# Patient Record
Sex: Female | Born: 1935 | Race: White | Hispanic: No | Marital: Married | Smoking: Former smoker
Health system: Southern US, Community
[De-identification: ages and names within clinical notes are randomized; demographics above are authoritative.]

## PROBLEM LIST (undated history)

## (undated) DIAGNOSIS — Z87442 Personal history of urinary calculi: Secondary | ICD-10-CM

## (undated) DIAGNOSIS — N819 Female genital prolapse, unspecified: Secondary | ICD-10-CM

## (undated) DIAGNOSIS — I10 Essential (primary) hypertension: Secondary | ICD-10-CM

## (undated) DIAGNOSIS — I2510 Atherosclerotic heart disease of native coronary artery without angina pectoris: Secondary | ICD-10-CM

## (undated) DIAGNOSIS — J309 Allergic rhinitis, unspecified: Secondary | ICD-10-CM

## (undated) DIAGNOSIS — M8580 Other specified disorders of bone density and structure, unspecified site: Secondary | ICD-10-CM

## (undated) DIAGNOSIS — K5790 Diverticulosis of intestine, part unspecified, without perforation or abscess without bleeding: Secondary | ICD-10-CM

## (undated) DIAGNOSIS — R011 Cardiac murmur, unspecified: Secondary | ICD-10-CM

## (undated) DIAGNOSIS — I219 Acute myocardial infarction, unspecified: Secondary | ICD-10-CM

## (undated) DIAGNOSIS — I493 Ventricular premature depolarization: Secondary | ICD-10-CM

## (undated) DIAGNOSIS — M1990 Unspecified osteoarthritis, unspecified site: Secondary | ICD-10-CM

## (undated) DIAGNOSIS — E785 Hyperlipidemia, unspecified: Secondary | ICD-10-CM

## (undated) DIAGNOSIS — K219 Gastro-esophageal reflux disease without esophagitis: Secondary | ICD-10-CM

## (undated) DIAGNOSIS — I6529 Occlusion and stenosis of unspecified carotid artery: Secondary | ICD-10-CM

## (undated) DIAGNOSIS — T8859XA Other complications of anesthesia, initial encounter: Secondary | ICD-10-CM

## (undated) HISTORY — DX: Female genital prolapse, unspecified: N81.9

## (undated) HISTORY — PX: CYSTOSCOPY: SUR368

## (undated) HISTORY — PX: CARDIAC CATHETERIZATION: SHX172

## (undated) HISTORY — DX: Allergic rhinitis, unspecified: J30.9

## (undated) HISTORY — DX: Ventricular premature depolarization: I49.3

## (undated) HISTORY — DX: Diverticulosis of intestine, part unspecified, without perforation or abscess without bleeding: K57.90

## (undated) HISTORY — DX: Occlusion and stenosis of unspecified carotid artery: I65.29

## (undated) HISTORY — DX: Hyperlipidemia, unspecified: E78.5

## (undated) HISTORY — DX: Other specified disorders of bone density and structure, unspecified site: M85.80

## (undated) HISTORY — DX: Unspecified osteoarthritis, unspecified site: M19.90

## (undated) HISTORY — DX: Essential (primary) hypertension: I10

## (undated) HISTORY — DX: Atherosclerotic heart disease of native coronary artery without angina pectoris: I25.10

## (undated) HISTORY — DX: Cardiac murmur, unspecified: R01.1

---

## 1997-11-16 ENCOUNTER — Other Ambulatory Visit: Admission: RE | Admit: 1997-11-16 | Discharge: 1997-11-16 | Payer: Self-pay | Admitting: Family Medicine

## 1998-02-07 ENCOUNTER — Ambulatory Visit (HOSPITAL_COMMUNITY): Admission: RE | Admit: 1998-02-07 | Discharge: 1998-02-07 | Payer: Self-pay | Admitting: Family Medicine

## 1999-02-20 ENCOUNTER — Ambulatory Visit (HOSPITAL_COMMUNITY): Admission: RE | Admit: 1999-02-20 | Discharge: 1999-02-20 | Payer: Self-pay | Admitting: Family Medicine

## 1999-02-20 ENCOUNTER — Encounter: Payer: Self-pay | Admitting: Family Medicine

## 1999-06-07 ENCOUNTER — Encounter: Admission: RE | Admit: 1999-06-07 | Discharge: 1999-06-07 | Payer: Self-pay | Admitting: Family Medicine

## 1999-06-07 ENCOUNTER — Encounter: Payer: Self-pay | Admitting: Family Medicine

## 2000-04-30 ENCOUNTER — Encounter: Admission: RE | Admit: 2000-04-30 | Discharge: 2000-04-30 | Payer: Self-pay | Admitting: Otolaryngology

## 2000-04-30 ENCOUNTER — Encounter: Payer: Self-pay | Admitting: Otolaryngology

## 2000-06-02 HISTORY — PX: CARPAL TUNNEL RELEASE: SHX101

## 2000-06-23 ENCOUNTER — Ambulatory Visit (HOSPITAL_COMMUNITY): Admission: RE | Admit: 2000-06-23 | Discharge: 2000-06-23 | Payer: Self-pay | Admitting: Family Medicine

## 2000-06-23 ENCOUNTER — Encounter: Payer: Self-pay | Admitting: Family Medicine

## 2001-09-07 ENCOUNTER — Encounter: Payer: Self-pay | Admitting: Family Medicine

## 2001-09-07 ENCOUNTER — Ambulatory Visit (HOSPITAL_COMMUNITY): Admission: RE | Admit: 2001-09-07 | Discharge: 2001-09-07 | Payer: Self-pay | Admitting: Family Medicine

## 2002-01-03 ENCOUNTER — Encounter: Admission: RE | Admit: 2002-01-03 | Discharge: 2002-01-03 | Payer: Self-pay | Admitting: Family Medicine

## 2002-01-03 ENCOUNTER — Encounter: Payer: Self-pay | Admitting: Family Medicine

## 2002-05-17 ENCOUNTER — Encounter: Admission: RE | Admit: 2002-05-17 | Discharge: 2002-05-17 | Payer: Self-pay | Admitting: Family Medicine

## 2002-05-17 ENCOUNTER — Encounter: Payer: Self-pay | Admitting: Family Medicine

## 2002-07-25 ENCOUNTER — Ambulatory Visit (HOSPITAL_COMMUNITY): Admission: RE | Admit: 2002-07-25 | Discharge: 2002-07-25 | Payer: Self-pay | Admitting: Family Medicine

## 2002-07-25 ENCOUNTER — Encounter: Payer: Self-pay | Admitting: Family Medicine

## 2002-09-27 ENCOUNTER — Encounter: Payer: Self-pay | Admitting: Family Medicine

## 2002-09-27 ENCOUNTER — Ambulatory Visit (HOSPITAL_COMMUNITY): Admission: RE | Admit: 2002-09-27 | Discharge: 2002-09-27 | Payer: Self-pay | Admitting: Family Medicine

## 2003-09-07 ENCOUNTER — Other Ambulatory Visit: Admission: RE | Admit: 2003-09-07 | Discharge: 2003-09-07 | Payer: Self-pay | Admitting: Family Medicine

## 2003-10-03 ENCOUNTER — Ambulatory Visit (HOSPITAL_COMMUNITY): Admission: RE | Admit: 2003-10-03 | Discharge: 2003-10-03 | Payer: Self-pay | Admitting: Family Medicine

## 2004-10-15 ENCOUNTER — Ambulatory Visit (HOSPITAL_COMMUNITY): Admission: RE | Admit: 2004-10-15 | Discharge: 2004-10-15 | Payer: Self-pay | Admitting: Family Medicine

## 2004-11-15 ENCOUNTER — Encounter: Admission: RE | Admit: 2004-11-15 | Discharge: 2004-11-15 | Payer: Self-pay | Admitting: Family Medicine

## 2004-11-15 ENCOUNTER — Other Ambulatory Visit: Admission: RE | Admit: 2004-11-15 | Discharge: 2004-11-15 | Payer: Self-pay | Admitting: Family Medicine

## 2005-11-11 ENCOUNTER — Ambulatory Visit (HOSPITAL_COMMUNITY): Admission: RE | Admit: 2005-11-11 | Discharge: 2005-11-11 | Payer: Self-pay | Admitting: Family Medicine

## 2005-11-14 ENCOUNTER — Other Ambulatory Visit: Admission: RE | Admit: 2005-11-14 | Discharge: 2005-11-14 | Payer: Self-pay | Admitting: Family Medicine

## 2006-08-01 HISTORY — PX: BUNIONECTOMY: SHX129

## 2006-08-14 ENCOUNTER — Ambulatory Visit (HOSPITAL_BASED_OUTPATIENT_CLINIC_OR_DEPARTMENT_OTHER): Admission: RE | Admit: 2006-08-14 | Discharge: 2006-08-14 | Payer: Self-pay | Admitting: Podiatry

## 2006-11-13 ENCOUNTER — Ambulatory Visit (HOSPITAL_COMMUNITY): Admission: RE | Admit: 2006-11-13 | Discharge: 2006-11-13 | Payer: Self-pay | Admitting: Family Medicine

## 2007-08-01 HISTORY — PX: TOTAL KNEE ARTHROPLASTY: SHX125

## 2007-08-12 ENCOUNTER — Inpatient Hospital Stay (HOSPITAL_COMMUNITY): Admission: RE | Admit: 2007-08-12 | Discharge: 2007-08-15 | Payer: Self-pay | Admitting: Orthopedic Surgery

## 2007-08-13 ENCOUNTER — Ambulatory Visit: Payer: Self-pay | Admitting: Physical Medicine & Rehabilitation

## 2007-11-25 ENCOUNTER — Ambulatory Visit (HOSPITAL_COMMUNITY): Admission: RE | Admit: 2007-11-25 | Discharge: 2007-11-25 | Payer: Self-pay | Admitting: Family Medicine

## 2008-02-10 ENCOUNTER — Other Ambulatory Visit: Admission: RE | Admit: 2008-02-10 | Discharge: 2008-02-10 | Payer: Self-pay | Admitting: Family Medicine

## 2008-11-27 ENCOUNTER — Ambulatory Visit (HOSPITAL_COMMUNITY): Admission: RE | Admit: 2008-11-27 | Discharge: 2008-11-27 | Payer: Self-pay | Admitting: Family Medicine

## 2009-08-17 ENCOUNTER — Encounter: Admission: RE | Admit: 2009-08-17 | Discharge: 2009-08-17 | Payer: Self-pay | Admitting: Rheumatology

## 2009-12-04 ENCOUNTER — Ambulatory Visit (HOSPITAL_COMMUNITY): Admission: RE | Admit: 2009-12-04 | Discharge: 2009-12-04 | Payer: Self-pay | Admitting: Family Medicine

## 2010-03-02 HISTORY — PX: TOTAL KNEE ARTHROPLASTY: SHX125

## 2010-03-13 ENCOUNTER — Inpatient Hospital Stay (HOSPITAL_COMMUNITY): Admission: RE | Admit: 2010-03-13 | Discharge: 2010-03-16 | Payer: Self-pay | Admitting: Orthopedic Surgery

## 2010-04-03 ENCOUNTER — Inpatient Hospital Stay (HOSPITAL_COMMUNITY): Admission: EM | Admit: 2010-04-03 | Discharge: 2010-04-07 | Payer: Self-pay | Admitting: Emergency Medicine

## 2010-06-23 ENCOUNTER — Encounter: Payer: Self-pay | Admitting: Family Medicine

## 2010-08-13 LAB — CBC
HCT: 26.8 % — ABNORMAL LOW (ref 36.0–46.0)
HCT: 27.4 % — ABNORMAL LOW (ref 36.0–46.0)
HCT: 30.2 % — ABNORMAL LOW (ref 36.0–46.0)
Hemoglobin: 8.7 g/dL — ABNORMAL LOW (ref 12.0–15.0)
Hemoglobin: 9.2 g/dL — ABNORMAL LOW (ref 12.0–15.0)
Hemoglobin: 9.9 g/dL — ABNORMAL LOW (ref 12.0–15.0)
MCH: 29 pg (ref 26.0–34.0)
MCHC: 32.5 g/dL (ref 30.0–36.0)
MCHC: 32.6 g/dL (ref 30.0–36.0)
MCV: 90.5 fL (ref 78.0–100.0)
Platelets: 277 10*3/uL (ref 150–400)
RBC: 2.92 MIL/uL — ABNORMAL LOW (ref 3.87–5.11)
RBC: 3.04 MIL/uL — ABNORMAL LOW (ref 3.87–5.11)
RBC: 3.17 MIL/uL — ABNORMAL LOW (ref 3.87–5.11)
RBC: 3.38 MIL/uL — ABNORMAL LOW (ref 3.87–5.11)
RDW: 15.3 % (ref 11.5–15.5)
RDW: 15.6 % — ABNORMAL HIGH (ref 11.5–15.5)
WBC: 10.9 10*3/uL — ABNORMAL HIGH (ref 4.0–10.5)
WBC: 10.9 10*3/uL — ABNORMAL HIGH (ref 4.0–10.5)

## 2010-08-13 LAB — BASIC METABOLIC PANEL
BUN: 12 mg/dL (ref 6–23)
BUN: 16 mg/dL (ref 6–23)
CO2: 25 mEq/L (ref 19–32)
Calcium: 9 mg/dL (ref 8.4–10.5)
Calcium: 9.1 mg/dL (ref 8.4–10.5)
Chloride: 107 mEq/L (ref 96–112)
Chloride: 109 mEq/L (ref 96–112)
Creatinine, Ser: 0.99 mg/dL (ref 0.4–1.2)
GFR calc Af Amer: >60 mL/min (ref >60)
GFR calc Af Amer: >60 mL/min (ref >60)
GFR calc Af Amer: >60 mL/min (ref >60)
GFR calc non Af Amer: 50 mL/min — ABNORMAL LOW (ref >60)
GFR calc non Af Amer: 55 mL/min — ABNORMAL LOW (ref >60)
GFR calc non Af Amer: 58 mL/min — ABNORMAL LOW (ref >60)
Glucose, Bld: 100 mg/dL — ABNORMAL HIGH (ref 70–99)
Potassium: 3.6 mEq/L (ref 3.5–5.1)
Potassium: 4.1 mEq/L (ref 3.5–5.1)
Sodium: 140 mEq/L (ref 135–145)
Sodium: 141 mEq/L (ref 135–145)

## 2010-08-13 LAB — CULTURE, BLOOD (ROUTINE X 2)
Culture  Setup Time: 201111050420
Culture: NO GROWTH

## 2010-08-13 LAB — COMPREHENSIVE METABOLIC PANEL
ALT: 67 U/L — ABNORMAL HIGH (ref 0–35)
AST: 83 U/L — ABNORMAL HIGH (ref 0–37)
Alkaline Phosphatase: 68 U/L (ref 39–117)
CO2: 21 mEq/L (ref 19–32)
Chloride: 100 mEq/L (ref 96–112)
GFR calc Af Amer: 47 mL/min — ABNORMAL LOW (ref >60)
GFR calc non Af Amer: 39 mL/min — ABNORMAL LOW (ref >60)
Glucose, Bld: 94 mg/dL (ref 70–99)
Sodium: 135 mEq/L (ref 135–145)
Total Bilirubin: 0.9 mg/dL (ref 0.3–1.2)

## 2010-08-13 LAB — DIFFERENTIAL
Basophils Absolute: 0 10*3/uL (ref 0.0–0.1)
Basophils Relative: 0 % (ref 0–1)
Eosinophils Absolute: 0 10*3/uL (ref 0.0–0.7)
Eosinophils Relative: 0 % (ref 0–5)

## 2010-08-13 LAB — URINE CULTURE: Culture  Setup Time: 201111021126

## 2010-08-13 LAB — URINE MICROSCOPIC-ADD ON

## 2010-08-13 LAB — URINALYSIS, ROUTINE W REFLEX MICROSCOPIC
Bilirubin Urine: NEGATIVE
Ketones, ur: 15 mg/dL — AB
Nitrite: NEGATIVE
Protein, ur: 30 mg/dL — AB
pH: 5.5 (ref 5.0–8.0)

## 2010-08-13 LAB — PROTIME-INR
INR: 1.32 (ref 0.00–1.49)
Prothrombin Time: 16.6 seconds — ABNORMAL HIGH (ref 11.6–15.2)

## 2010-08-13 LAB — LIPASE, BLOOD: Lipase: 21 U/L (ref 11–59)

## 2010-08-13 LAB — MRSA PCR SCREENING: MRSA by PCR: NEGATIVE

## 2010-08-14 LAB — PROTIME-INR: Prothrombin Time: 19.2 seconds — ABNORMAL HIGH (ref 11.6–15.2)

## 2010-08-15 LAB — CBC
Hemoglobin: 12.8 g/dL (ref 12.0–15.0)
MCH: 30.4 pg (ref 26.0–34.0)
RBC: 4.21 MIL/uL (ref 3.87–5.11)

## 2010-08-15 LAB — PROTIME-INR
INR: 1.14 (ref 0.00–1.49)
INR: 1.63 — ABNORMAL HIGH (ref 0.00–1.49)
Prothrombin Time: 19.5 seconds — ABNORMAL HIGH (ref 11.6–15.2)

## 2010-08-15 LAB — COMPREHENSIVE METABOLIC PANEL
ALT: 21 U/L (ref 0–35)
AST: 21 U/L (ref 0–37)
Alkaline Phosphatase: 41 U/L (ref 39–117)
CO2: 32 mEq/L (ref 19–32)
Chloride: 106 mEq/L (ref 96–112)
Creatinine, Ser: 0.87 mg/dL (ref 0.4–1.2)
GFR calc Af Amer: >60 mL/min (ref >60)
GFR calc non Af Amer: >60 mL/min (ref >60)
Potassium: 4.8 mEq/L (ref 3.5–5.1)
Sodium: 142 mEq/L (ref 135–145)
Total Bilirubin: 0.6 mg/dL (ref 0.3–1.2)

## 2010-10-15 NOTE — Op Note (Signed)
NAMEEARMA, Jacqueline Burke                ACCOUNT NO.:  [id number]   MEDICAL RECORD NO.:  [id number]          PATIENT TYPE:  INP   LOCATION:  2550                         FACILITY:  [facility]   PHYSICIAN:  Jacqueline Mustard, MD     DATE OF BIRTH:  07-03-1935   DATE OF PROCEDURE:  08/12/2007  DATE OF DISCHARGE:                               OPERATIVE REPORT   PREOPERATIVE DIAGNOSIS:  Osteoarthritis, left knee.   POSTOPERATIVE DIAGNOSIS:  Osteoarthritis, left knee.   PROCEDURE:  Left total knee arthroplasty with DePuy components, #3  femur, #3 tibia, 10-mm poly tray, and a 35-mm patella.   SURGEON:  Jacqueline Mustard, MD   ANESTHESIA:  General plus femoral block.   ESTIMATED BLOOD LOSS:  Minimal.   ANTIBIOTICS:  1 g of Kefzol.   DRAINS:  None.   COMPLICATIONS:  None.   TOURNIQUET TIME:  37 minutes at 300 mmHg at the thigh.   DISPOSITION:  To PACU in stable condition.   INDICATION FOR PROCEDURE:  The patient is a 75 year old woman with  osteoarthritis of her left knee.  She has failed conservative care.  She  has pain with activities of daily living and presents at this time for a  total knee arthroplasty.  The risks and benefits were discussed  including infection, neurovascular injury, persistent pain, need for  additional surgery, as well as DVT and pulmonary embolus.  The patient  states she understands and wishes to proceed at this time.   DESCRIPTION OF PROCEDURE:  The patient was brought to OR room #4 after  undergoing a femoral block.  The patient then underwent a general  anesthetic.  After adequate levels of anesthesia obtained, the patient's  left lower extremity was prepped using DuraPrep and draped into a  sterile field.  A midline incision was made.  This was carried down  through a medial parapatellar retinacular incision.  The patella was  everted.  The drill was used to start the entry portal for the canal.  In the femur the IM guide rod was inserted.  This was set for 11  mm at 5  degrees of valgus.  The distal cut was made.  This was sized for a size  3 and the chamfer cuts were made for a size 3.  Attention was then  focused on the tibia.  The tibia was set to take 10 mm off the proximal  tibia with neutral posterior slope, neutral varus and valgus.  The  tibial cut was made.  This was sized for a 3.  The trial 3 implant was  placed with the keel punch made for the 3 implant.  The box cut was then  made on the femur and the femoral trial was inserted.  The knee was  placed through range of motion with a 10-mm poly tray and this  had good  extension and flexion and the collateral ligaments were stable.  The  trial components were removed after the femoral plug holes were drilled.  The patella was resurfaced with 10 mm taken off the patella.  This was  sized for a 35 and the drill holes were made for the lugs on the  patella.  The knee was irrigated with pulse lavage.  The cement was  mixed and the tibial and the femoral components were cemented in place.  All loose cement was removed and the tibial tray was then inserted.  The  knee was left in extension until the cement had hardened.  The patella  was also inserted with the patella clamp and cement.  The cement was  removed and this was left in place until the cement had hardened.  The  patient did have a little bit a lateral tracking with the patella and a  lateral release was performed.  This allowed full range of motion  without any subluxation or dislocation of the patella.  Hemostasis was  obtained.  The posterior aspect of the capsule was injected with 30 mL  of 0.25% Marcaine plain.  The retinaculum was closed using #1 Vicryl.  Subcu was closed using 2-0 Vicryl.  The skin was closed using Proximate  staples.  The wound was covered with Mepilex, ABD and Hypafix tape.  The  patient was extubated, taken to PACU in stable condition.      Jacqueline Mustard, MD  Electronically Signed     MVD/MEDQ   D:  08/12/2007  T:  08/13/2007  Job:  [id number]

## 2010-10-18 NOTE — Op Note (Signed)
NAME:  Jacqueline Burke, Jacqueline Burke                ACCOUNT NO.:  [id number]   MEDICAL RECORD NO.:  [id number]          PATIENT TYPE:  AMB   LOCATION:  DSC                          FACILITY:  [facility]   PHYSICIAN:  Max T. Hyatt, D.P.M.   DATE OF BIRTH:  05-02-1936   DATE OF PROCEDURE:  08/16/2006  DATE OF DISCHARGE:  08/14/2006                               OPERATIVE REPORT   PREOPERATIVE DIAGNOSES:  1. Hallux abductus valgus deformity; bunion deformity right foot.  2. Plantar flex second metatarsal right foot.  3. Contracted digit; hammertoe deformity second digit right foot.   POSTOPERATIVE DIAGNOSES:  1. Hallux abductus valgus deformity; bunion deformity right foot.  2. Plantar flex second metatarsal right foot.  3. Contracted digit; hammertoe deformity second digit right foot.   PROCEDURE:  1. An Austin-type osteotomy; bunion repair double K-wire fixation      first metatarsal right foot.  2. A second metatarsal osteotomy shortening in nature distal osteotomy      with screw fixation utilizing a 2.0, 12-mm long Synthes screw      second metatarsal right foot.  3. Hammertoe repair; arthrodesis proximal interphalangeal joint      utilizing an Osteomed screw 2.4, 44 mm in length.   SURGEON:  Max T. Hyatt, D.P.M.   There was no assistant.   PROCEDURE:  Ms. Arciniega was taken to the operating room, vital signs  stable, alert and oriented x3 where she was placed supine on the  operating room table, an IV sedation was administered.  After the  induction of IV sedation, local anesthetic was administered about the  first and second metatarsals in a Mayo style fashion utilizing a total  of 20 cc and a 50:50 mixture of Marcaine plain and lidocaine plain.  A  pneumatic ankle tourniquet was placed about the right lower extremity  after plenty of Webril padding to prevent contusion.  The right lower  extremity was then prepped and draped in its normal sterile fashion.   PROCEDURE 1 FROM ABOVE:  An  Esmarch bandage was utilized to exsanguinate  the blood from the right lower extremity.  The pneumatic ankle  tourniquet was inflated to 250 mmHg.  An incision plane was made over  the first metatarsal phalangeal joint utilizing a skin marker medial to  the extensor hallucis longus tendon.  This incision plane was then  carried out with a 15-blade.  It was carried deep with a combination of  sharp and blunt dissection to the level of the first metatarsal  phalangeal joint.  All neurovascular structures were carefully  retracted.  All bleeders were Bovied and/or ligated.  Once to the level  of the first metatarsal phalangeal joint, attention was directed to the  lateral aspect of the first metatarsal phalangeal joint or the first  intermetatarsal space where the abductor tendon was identified and  released from its insertion on the fibular sesamoid and the base of the  proximal phalanx.  It was cut and removed from the foot.  The fibular  sesamoid was then released to the best of my ability though there was  a  considerable amount of arthrosis in this area.  There was some bleeding  in the area.  This was controlled with Bovie and ligation.  The area was  copiously lavaged with normal sterile saline.  Attention was then  directed to the dorsal and medial aspect of the first metatarsal  phalangeal joint where a typical L-type incision was made to perform the  arthrotomy.  The periosteum and capsule were carefully reflected both  medially and laterally revealing the head of the first metatarsal to the  wound.  Utilizing a McGlamry elevator, I was then able to free the  fibular sesamoid from its adhesion to the plantar aspect of the first  metatarsal.  I then resected using a sagittal saw the hypertrophic  condyle to the medial aspect of the head of that first metatarsal from  distal to proximal.  This allowed for a flat area for me to perform an  Austin-type osteotomy or a chevron osteotomy  from medial to lateral.  The apex was centrally located in the head of the first metatarsal.  The  wings were both proximally oriented, one dorsal, one plantar.  The apex  and these wing planes were then incised with the utilizing of sagittal  saw from medial to lateral releasing a capita fragment which could be  transposed either medially or laterally.  I transposed the capita  fragment laterally aligning the first metatarsal phalangeal joint to a  more normal anatomical resemblance.  Once in good alignment, I utilized  two 0.045 K-wires to fixate the capital fragment.  Utilizing the  __________ scan or OEC machine to make sure that there was no intrusion  of the K-wires into the joint.  The K-wires were then cut and then  turned parallel with the bone and lying flush with the bone.  The  remaining ledge of bone from the medial aspect of the first metatarsal  was then sharply resected with a sagittal saw and recontoured to a more  normal anatomical resemblance.  The area was copiously lavaged with  normal sterile saline.  The periosteum and capsule were closed with a 3-  0 Monocryl in a running fashion as the toe was held in a corrected  position.  The subcutaneous tissue was then closed with a 4-0 Monocryl  and a 5-0 Monocryl for subcuticular closure.  Attention was then  directed to the second metatarsal and to the second digit.   PROCEDURE 2 FROM ABOVE:  An incision plane was made overlying the second  digit and the second metatarsal phalangeal joint.  A double elliptical  incision plan was drawn out over the PIPJ of the second digit extending  proximally to the level of the metatarsal phalangeal joint.  This entire  area was incised removing the wedge of skin and placing it on the back  table.  The incision was carried more proximally overlying the second  metatarsal head.  The incision was then deepened with a combination of sharp and blunt dissection.  All neurovascular structures  were carefully  retracted.  All bleeders were Bovied and/or ligated.  The extensor hood  apparatus was then carefully reflected from its insertion site on the  base of the proximal phalanx and the head of the second metatarsal.  Removing this laterally did allow me to see the capsule of the second  metatarsal phalangeal joint which was considerably attenuated or  thinned.  I did incise dorsally overlying this metatarsal phalangeal  joint performing an incision into  the periosteum and performing an  arthrotomy.  Utilizing a 15-blade, I carefully reflected the periosteum  and the capsule from the head of the second metatarsal.  I also utilized  a small McGlamry elevator to release the plantar plate from that second  metatarsal.  This did allow for the toe to drop down in more of a normal  anatomical position.  At this point and time, utilizing a sagittal saw  in the distal most aspect of the second metatarsal, I made an osteotomy  from dorsal distal to proximal plantar.  This osteotomy was performed in  parallel to the weightbearing surface and allowed for the head of the  capital fragment or the head of the second metatarsal to slide  proximally on the second metatarsal.  Once utilizing the __________  scan, we made sure that the metatarsal parabola was in good alignment.  The capital fragment was held in place as a 2.0, 12-mm cortical screw  was placed in a normal lag fashion through the dorsal aspect of the  metatarsal and into the capital fragment.  Great compression was  achieved here, and good alignment was achieved as well.  Utilizing a  rongeur and a bone cutter, I resected the overlying edge of bone from  the distal portion of the osteotomy, recontoured it with a more normal  anatomical resemblance with an oscillating rasp.  I then re-tightened  the screw just to make sure that good compression was achieved.  The  area was copiously lavaged with normal sterile saline.  The  periosteum  was closed with a 4-0 Monocryl, and the extensor hood was repaired with  a 4-0 Monocryl.  I did not close the entire incision because we had the  toe left to work on.   PROCEDURE 3 FROM ABOVE:  Attention was directed to the PIPJ of the  second digit, procedure number three from above, hammertoe repair with  screw fixation.  A tenotomy and a capsulotomy was performed at the PIPJ  reflecting the extensor tendon proximally, the medial and lateral  collaterals were reflected, and the head of the second proximal phalanx  was delivered through the wound.  Utilizing a sagittal saw from dorsal  to plantar in a transverse fashion, I resected the distal most portion  of the head of the proximal phalanx.  Likewise, I resected the cartilage  from the proximal portion of the intermediate phalanx in parallel with  the head of the proximal phalanx.  I then placed a 0.035 K-wire down the shaft of the proximal phalanx as a guide hole.  I then put the 0.035 K-  wire on the wire driver and drove it from proximal to distal from the  base of the intermediate phalanx through the distal most aspect of the  toe.  I then retrograded it out the end of the toe, placed the two  osteotomy sites together, drove the K-wire from distal to proximal, not  intruding into the second metatarsal phalangeal joint, utilizing the  OEC, I then was able to make sure that we had good closure of our  arthrodesis site, appropriate alignment of the K-wire, and that we did  not intrude into the joint.  At this point, I did make a small cut in  the end of the toe where the K-wire was present in order to introduce a  screw.  However, first I measured the K-wire, and it measured out at 44  mm.  Utilizing a 2.0 drill, I then  drilled over the K-wire which was  cannulated.  I did, utilizing a cannulated 2-0 drill I drilled from  distal to proximal for a guide hole for the screw to be placed.  Once  that was removed,  I advanced  the K-wire a little further into the  second metatarsal phalangeal joint.  I then utilized a 2.4, 44-mm  Osteomed cannulated digital compression screw and screwed that across  from the distal aspect of the toe across the osteotomy site once again  utilizing the OEC to make sure that this did not intrude into the joint.  It did not.  I removed the K-wire, and the toe was left nice and  straight in a rectus fashion.  The area was copiously lavaged with  normal sterile saline.  Closure was continued with a 4-0 Monocryl to  repair the tendon and capsular ligaments at the PIPJ.  Again, 4-0  Monocryl was used for subcutaneous tissue, and I used the 5-0 Prolene  for the entire length of the metatarsal phalangeal joint as well as the  second digit.  I also closed the end of the second digit with a 5-0  Prolene where the screw was introduced.  At this point, I palpated the  distal aspect of the toe.  The head was not palpable.  A __________  scan was taken.  All three surgical sites appeared to be in good  alignment, and we have achieved our correction.  A tincture of benzoin  was applied to the surgical sites, Steri-Strips were applied, Betadine  solution was applied, Xeroform gauze was applied over all incisions, and  a dry sterile compressive dressing was applied to the right foot.  A KAM  walker was placed about the right lower extremity after the pneumatic  ankle tourniquet was removed.  Digits 1 through 5 of the right foot was  noted to be immediate prior to leaving the operating room.  The KAM  walker was then placed.  The patient left the operating room vital signs  stable, alert and oriented x3 for PACU where vital signs will be  monitored for a period of time before being released to family members.  Both oral and written home-going instructions will be provided.  The  patient will elevate the leg above the level of the heart.  She will also take a daily aspirin to help prevent DVT's.   She is unable to take  narcotics due to allergies.  She was give a prescription for Keflex,  orders to take over-the-counter ibuprofen 800 mg three times a day with  food as well as Tylenol Extra Strength every six hours.  I will follow  up with Ms. Werst in the [place] office of the [facility] in  one week.  Should she have questions or concerns, she will notify us  immediately with the emergency beeper system or notify me personally  with my cell phone or notify our office.           ______________________________  Max T. Al Corpus, D.P.M.     MTH/MEDQ  D:  08/16/2006  T:  08/16/2006  Job:  [id number]

## 2010-10-18 NOTE — Discharge Summary (Signed)
Jacqueline Burke, Jacqueline Burke                ACCOUNT NO.:  [id number]   MEDICAL RECORD NO.:  [id number]          PATIENT TYPE:  INP   LOCATION:  5003                         FACILITY:  [facility]   PHYSICIAN:  Nadara Mustard, MD     DATE OF BIRTH:  11-30-1935   DATE OF ADMISSION:  08/12/2007  DATE OF DISCHARGE:  08/15/2007                               DISCHARGE SUMMARY   DISCHARGE DIAGNOSES:  1. Left knee osteoarthritis  2. Acute blood loss anemia.   PROCEDURE:  Left total knee arthroplasty.  Discharged to home in stable  condition.  Follow up in the office in 2 weeks.   HISTORY OF PRESENT ILLNESS:  The patient is a 75 year old woman with  osteoarthritis of her left knee.  She had failed conservative care.  She  had pain with activities of daily living and presents at this time for a  left total knee arthroplasty.   The patient's hospital course was essentially unremarkable.  She  underwent a left total knee arthroplasty on August 12, 2007 with DePuy  components, #3 femur, #3 tibia, 10-mm poly tray, posterior stabilized  rotating platform, and 35-mm patella.  The patient received a femoral  block.  She received Kefzol for infection prophylaxis.  Tourniquet time  was 37 minutes.  She was started on Coumadin for DVT prophylaxis.  Postoperatively, the patient progressed well with physical therapy.  She  developed a rash on her right hand, and the morphine was discontinued on  postoperative day #1.   On postoperative day #1, her hemoglobin was 9.6.  Social worker was  consulted for home health physical therapy.  The patient progressed well  with physical therapy and was discharged to home with stable condition  on August 15, 2007 with followup in the office in 2 weeks.      Nadara Mustard, MD  Electronically Signed     MVD/MEDQ  D:  09/16/2007  T:  09/17/2007  Job:  [id number]

## 2010-12-23 ENCOUNTER — Other Ambulatory Visit (HOSPITAL_COMMUNITY): Payer: Self-pay | Admitting: Family Medicine

## 2010-12-23 DIAGNOSIS — Z1231 Encounter for screening mammogram for malignant neoplasm of breast: Secondary | ICD-10-CM

## 2011-01-01 ENCOUNTER — Ambulatory Visit (HOSPITAL_COMMUNITY)
Admission: RE | Admit: 2011-01-01 | Discharge: 2011-01-01 | Disposition: A | Discharge status: Home / Self Care | Payer: Medicare Other | Source: Ambulatory Visit | Attending: Family Medicine | Admitting: Family Medicine

## 2011-01-01 DIAGNOSIS — Z1231 Encounter for screening mammogram for malignant neoplasm of breast: Secondary | ICD-10-CM | POA: Insufficient documentation

## 2011-02-24 LAB — CBC
HCT: 26.3 — ABNORMAL LOW
HCT: 25.9 — ABNORMAL LOW
Hemoglobin: 13.5
Hemoglobin: 9 — ABNORMAL LOW
MCHC: 34.3
MCHC: 34.4
MCV: 93.1
MCV: 93.7
Platelets: 257
RBC: 2.95 — ABNORMAL LOW
RBC: 4.24
RBC: 2.77 — ABNORMAL LOW
RDW: 13.1
RDW: 13.6
WBC: 11.8 — ABNORMAL HIGH

## 2011-02-24 LAB — BASIC METABOLIC PANEL
BUN: 11
CO2: 29
CO2: 30
Calcium: 10.3
Chloride: 101
Chloride: 105
Chloride: 100
Creatinine, Ser: 0.85
Creatinine, Ser: 0.7
GFR calc Af Amer: >60
GFR calc Af Amer: >60
GFR calc non Af Amer: >60
GFR calc non Af Amer: >60
Glucose, Bld: 96
Potassium: 3.3 — ABNORMAL LOW
Potassium: 4.3
Potassium: 3.3 — ABNORMAL LOW
Sodium: 139
Sodium: 140

## 2011-02-24 LAB — PROTIME-INR
INR: 1
Prothrombin Time: 13.4
Prothrombin Time: 17.2 — ABNORMAL HIGH

## 2011-03-11 ENCOUNTER — Other Ambulatory Visit: Payer: Self-pay | Admitting: Obstetrics and Gynecology

## 2011-03-11 ENCOUNTER — Other Ambulatory Visit (HOSPITAL_COMMUNITY)
Admission: RE | Admit: 2011-03-11 | Discharge: 2011-03-11 | Disposition: A | Discharge status: Home / Self Care | Payer: Medicare Other | Source: Ambulatory Visit | Attending: Obstetrics and Gynecology | Admitting: Obstetrics and Gynecology

## 2011-03-11 DIAGNOSIS — Z124 Encounter for screening for malignant neoplasm of cervix: Secondary | ICD-10-CM | POA: Insufficient documentation

## 2012-01-06 ENCOUNTER — Other Ambulatory Visit (HOSPITAL_COMMUNITY): Payer: Self-pay | Admitting: Family Medicine

## 2012-01-06 DIAGNOSIS — Z1231 Encounter for screening mammogram for malignant neoplasm of breast: Secondary | ICD-10-CM

## 2012-01-08 ENCOUNTER — Ambulatory Visit (HOSPITAL_COMMUNITY)
Admission: RE | Admit: 2012-01-08 | Discharge: 2012-01-08 | Disposition: A | Discharge status: Home / Self Care | Payer: Medicare Other | Source: Ambulatory Visit | Attending: Family Medicine | Admitting: Family Medicine

## 2012-01-08 DIAGNOSIS — Z1231 Encounter for screening mammogram for malignant neoplasm of breast: Secondary | ICD-10-CM | POA: Insufficient documentation

## 2012-12-23 ENCOUNTER — Other Ambulatory Visit (HOSPITAL_COMMUNITY): Payer: Self-pay | Admitting: Family Medicine

## 2012-12-23 DIAGNOSIS — Z1231 Encounter for screening mammogram for malignant neoplasm of breast: Secondary | ICD-10-CM

## 2013-01-10 ENCOUNTER — Ambulatory Visit (HOSPITAL_COMMUNITY)
Admission: RE | Admit: 2013-01-10 | Discharge: 2013-01-10 | Disposition: A | Discharge status: Home / Self Care | Payer: Medicare Other | Source: Ambulatory Visit | Attending: Family Medicine | Admitting: Family Medicine

## 2013-01-10 DIAGNOSIS — Z1231 Encounter for screening mammogram for malignant neoplasm of breast: Secondary | ICD-10-CM

## 2013-06-09 ENCOUNTER — Encounter: Payer: Self-pay | Admitting: Podiatry

## 2013-06-09 ENCOUNTER — Telehealth: Payer: Self-pay | Admitting: *Deleted

## 2013-06-09 ENCOUNTER — Ambulatory Visit (INDEPENDENT_AMBULATORY_CARE_PROVIDER_SITE_OTHER): Payer: Medicare Other | Admitting: Podiatry

## 2013-06-09 VITALS — BP 151/90 | HR 82 | Resp 12 | Ht 61.0 in | Wt 163.0 lb

## 2013-06-09 DIAGNOSIS — B351 Tinea unguium: Secondary | ICD-10-CM

## 2013-06-09 DIAGNOSIS — L608 Other nail disorders: Secondary | ICD-10-CM

## 2013-06-09 NOTE — Progress Notes (Signed)
   Subjective:    Patient ID: Jacqueline Burke, female    DOB: 06-08-1935, 78 y.o.   MRN: [id number]  HPI Comments: '' BOTH BIG TOENAILS ARE LITTLE TENDER AND HAVE DISCOLORATION FOR 3- 4 MONTHS, BUT TRIED NO TREATMENT.''     Review of Systems  All other systems reviewed and are negative.       Objective:   Physical Exam: I have reviewed her past medical history medications allergies surgical history social history family history and review of systems. Vital signs are stable she is alert and oriented x3. Pulses are strongly palpable bilateral capillary fill time to digits one through 5 bilateral being immediate. Neurologic sensorium is intact per since once the monofilament. Deep tendon reflexes are intact bilateral muscle strength is 5 over 5 dorsiflexors plantar flexors inverters everters all intrinsic musculature is intact. Orthopedic evaluation demonstrates all joints distal to the ankle a full range of motion without crepitus cutaneous evaluation demonstrates supple well hydrated cutis no signs of infection nail plates the hallux bilateral do demonstrate discoloration and distal onychomycosis indicative of irritation and chronic trauma. I evaluated her shoe gear which led [place] to believe that her great nails are rubbing the tips of the shoes resulting in loosening of the nail plate in bleeding the needed. However she does have an area of onychomycosis to the lateral aspect of the hallux nail left which is possibly indicative of some changes such as onychomycosis.        Assessment & Plan:  Assessment: Nail dystrophy rule out onychomycosis hallux bilateral.  Plan: Samples of the nail and skin were taken today to be sent for mycotic evaluation. We will notify her once her report comes back in the meantime I suggested she should get longer shoes and keep her nails cut short.

## 2013-06-10 NOTE — Telephone Encounter (Signed)
Sent out specimen to [facility] for a fungal culture.

## 2013-07-08 ENCOUNTER — Encounter: Payer: Self-pay | Admitting: Cardiology

## 2013-07-19 ENCOUNTER — Encounter: Payer: Self-pay | Admitting: Podiatry

## 2013-07-28 ENCOUNTER — Ambulatory Visit: Payer: Medicare Other | Admitting: Cardiology

## 2013-08-03 ENCOUNTER — Telehealth: Payer: Self-pay | Admitting: *Deleted

## 2013-08-03 NOTE — Telephone Encounter (Signed)
Dr Milinda Pointer states fungal results of 06/09/2012 are negative for fungus, may try NuVail.  Pt informed of Dr Stephenie Acres recommendation.  Pt states nail came totally off and will see how grows back.

## 2013-08-10 ENCOUNTER — Encounter: Payer: Self-pay | Admitting: Podiatry

## 2013-08-15 ENCOUNTER — Encounter: Payer: Self-pay | Admitting: General Surgery

## 2013-08-15 DIAGNOSIS — I119 Hypertensive heart disease without heart failure: Secondary | ICD-10-CM | POA: Insufficient documentation

## 2013-08-19 ENCOUNTER — Ambulatory Visit (INDEPENDENT_AMBULATORY_CARE_PROVIDER_SITE_OTHER): Payer: Medicare Other | Admitting: Cardiology

## 2013-08-19 ENCOUNTER — Encounter: Payer: Self-pay | Admitting: Cardiology

## 2013-08-19 VITALS — BP 130/80 | HR 80 | Ht 61.0 in | Wt 163.0 lb

## 2013-08-19 DIAGNOSIS — I493 Ventricular premature depolarization: Secondary | ICD-10-CM

## 2013-08-19 DIAGNOSIS — I119 Hypertensive heart disease without heart failure: Secondary | ICD-10-CM

## 2013-08-19 DIAGNOSIS — I4949 Other premature depolarization: Secondary | ICD-10-CM

## 2013-08-19 MED ORDER — AMLODIPINE BESYLATE 5 MG PO TABS: 5.0000 mg | ORAL_TABLET | Freq: Every day | ORAL | Status: DC

## 2013-08-19 NOTE — Progress Notes (Signed)
[facility] [place], [place]  [address] Phone: [phone] Fax:  [phone]  Date:  08/19/2013   ID:  Jacqueline Burke, DOB 1935-10-02, MRN [id number]  PCP:  Marjorie Smolder, MD  Cardiologist:  Fransico Him, MD     History of Present Illness: Jacqueline Burke is a 78 y.o. female with a history of HTN, PVC's and chronic noncardiac CP with normal stress test who presents today for followup.  She is doing well.  She denies any chest pain, SOB, DOE, LE edema, dizziness, palpitations or syncope.   Wt Readings from Last 3 Encounters:  08/19/13 163 lb (73.936 kg)  06/09/13 163 lb (73.936 kg)     Past Medical History  Diagnosis Date  . Osteoarthritis     Erosive OA-MRI of R hand negative for synovitis, only OA-Dr Trudie Reed  . Allergic rhinitis   . Hyperlipidemia   . Diverticulosis   . Osteopenia   . Hypertension   . PVC's (premature ventricular contractions)   . Heart murmur     Systolic heart murmur with Aortic valve sclerosis by ECHO  . Chronic chest pain     and DOE w normal Stress cardiolyte  . Pelvic prolapse     Current Outpatient Prescriptions  Medication Sig Dispense Refill  . acetaminophen (TYLENOL) 100 MG/ML solution Take 10 mg/kg by mouth every 4 (four) hours as needed for fever.      Marland Kitchen alendronate (FOSAMAX) 70 MG tablet Take 70 mg by mouth once a week. Take with a full glass of water on an empty stomach.      Marland Kitchen amitriptyline (ELAVIL) 10 MG tablet Take 20 mg by mouth at bedtime.       Marland Kitchen amLODipine (NORVASC) 5 MG tablet Take 5 mg by mouth daily.      Marland Kitchen atorvastatin (LIPITOR) 10 MG tablet Take 10 mg by mouth daily.      . cholecalciferol (VITAMIN D) 1000 UNITS tablet Take 1,000 Units by mouth daily.      Marland Kitchen conjugated estrogens (PREMARIN) vaginal cream Place 1 Applicatorful vaginally once a week.      . docusate sodium (COLACE) 100 MG capsule Take 300 mg by mouth daily.      . Misc Natural Products (MULLEIN GARLIC EAR DROPS OT) Place in ear(s).       No  current facility-administered medications for this visit.    Allergies:    Allergies  Allergen Reactions  . Ciprofloxacin Hcl   . Codeine   . Darvon [Propoxyphene]   . Demerol [Meperidine]   . Hydrogen Peroxide     White blisters    Social History:  The patient  reports that she quit smoking about 30 years ago. She does not have any smokeless tobacco history on file. She reports that she does not drink alcohol or use illicit drugs.   Family History:  The patient's family history includes Diabetes in her mother; Stroke in her mother.   ROS:  Please see the history of present illness.      All other systems reviewed and negative.   PHYSICAL EXAM: VS:  BP 130/80  Pulse 80  Ht 5\' 1"  (1.549 m)  Wt 163 lb (73.936 kg)  BMI 30.81 kg/m2 Well nourished, well developed, in no acute distress HEENT: normal Neck: no JVD Cardiac:  normal S1, S2; RRR; no murmur Lungs:  clear to auscultation bilaterally, no wheezing, rhonchi or rales Abd: soft, nontender, no hepatomegaly Ext: no edema Skin: warm and  dry Neuro:  CNs 2-12 intact, no focal abnormalities noted  EKG:   NSR with no ST changes  ASSESSMENT AND PLAN:  1. HTN - well controlled - continue Amlodipine 2.  PVC's - asymptomatic 3.  Chronic CP and SOB which has now resolved  Followup with me in 6 months  Signed, Fransico Him, MD 08/19/2013 3:01 PM

## 2013-08-19 NOTE — Patient Instructions (Signed)
Your physician recommends that you continue on your current medications as directed. Please refer to the Current Medication list given to you today.  Your physician wants you to follow-up in: 6 months with Dr Turner You will receive a reminder letter in the mail two months in advance. If you don't receive a letter, please call our office to schedule the follow-up appointment.  

## 2013-11-01 ENCOUNTER — Ambulatory Visit
Admission: RE | Admit: 2013-11-01 | Discharge: 2013-11-01 | Disposition: A | Discharge status: Home / Self Care | Payer: Medicare Other | Source: Ambulatory Visit | Attending: Family Medicine | Admitting: Family Medicine

## 2013-11-01 ENCOUNTER — Other Ambulatory Visit: Payer: Self-pay | Admitting: Family Medicine

## 2013-11-01 DIAGNOSIS — J309 Allergic rhinitis, unspecified: Secondary | ICD-10-CM

## 2013-11-02 ENCOUNTER — Other Ambulatory Visit: Payer: Self-pay | Admitting: Family Medicine

## 2013-11-02 DIAGNOSIS — J329 Chronic sinusitis, unspecified: Secondary | ICD-10-CM

## 2013-11-04 ENCOUNTER — Ambulatory Visit
Admission: RE | Admit: 2013-11-04 | Discharge: 2013-11-04 | Disposition: A | Discharge status: Home / Self Care | Payer: Medicare Other | Source: Ambulatory Visit | Attending: Family Medicine | Admitting: Family Medicine

## 2013-11-04 DIAGNOSIS — J329 Chronic sinusitis, unspecified: Secondary | ICD-10-CM

## 2013-11-11 ENCOUNTER — Ambulatory Visit (INDEPENDENT_AMBULATORY_CARE_PROVIDER_SITE_OTHER): Payer: Medicare Other | Admitting: Emergency Medicine

## 2013-11-11 ENCOUNTER — Encounter: Payer: Self-pay | Admitting: Emergency Medicine

## 2013-11-11 VITALS — BP 140/92 | HR 83 | Ht 62.0 in | Wt 162.0 lb

## 2013-11-11 DIAGNOSIS — J329 Chronic sinusitis, unspecified: Secondary | ICD-10-CM | POA: Insufficient documentation

## 2013-11-11 DIAGNOSIS — R0600 Dyspnea, unspecified: Secondary | ICD-10-CM

## 2013-11-11 DIAGNOSIS — R0609 Other forms of dyspnea: Secondary | ICD-10-CM

## 2013-11-11 DIAGNOSIS — J849 Interstitial pulmonary disease, unspecified: Secondary | ICD-10-CM

## 2013-11-11 DIAGNOSIS — R0989 Other specified symptoms and signs involving the circulatory and respiratory systems: Secondary | ICD-10-CM

## 2013-11-11 DIAGNOSIS — J8410 Pulmonary fibrosis, unspecified: Secondary | ICD-10-CM

## 2013-11-11 MED ORDER — FLUTICASONE PROPIONATE 50 MCG/ACT NA SUSP: 2.0000 | Freq: Every day | NASAL | Status: DC

## 2013-11-11 NOTE — Patient Instructions (Signed)
Please start doing nasal saline washes every day Start using fluticasone nasal spray, 2 sprays each nostril 1 -2 times a day Continue your Augmentin We will perform a high resolution CT scan of your chest  We will perform full pulmonary function testing  Follow with Dr Lamonte Sakai next available after your testing

## 2013-11-11 NOTE — Assessment & Plan Note (Signed)
-   has been treated on and off of antibiotics but not clear that she has had a full course to address chronic disease.  - She is going to follow with Dr. Redmond Baseman to review her CT scan and discuss other possible interventions - I will start nasal saline washes and a nasal steroid

## 2013-11-11 NOTE — Progress Notes (Signed)
Subjective:    Patient ID: Jacqueline Burke, female    DOB: 02/10/1936, 78 y.o.   MRN: [id number]  HPI 78 yo former smoker (25 pk-yrs), hx of allergies, hyperlipidemia, Osteoarthritis. She is referred by Dr Inda Merlin. She has recently been evaluated for recurrent HA, nasal drainage, purulent mucous from sinuses. Has been treated with Augmentin, other abx and does better when she is on abx > lighter mucous. She has been coughing through the whole thing.    Review of Systems  Constitutional: Negative for fever and unexpected weight change.  HENT: Positive for congestion, postnasal drip and sinus pressure. Negative for dental problem, ear pain, nosebleeds, rhinorrhea, sneezing, sore throat and trouble swallowing.   Eyes: Negative for redness and itching.  Respiratory: Positive for cough and shortness of breath. Negative for chest tightness and wheezing.   Cardiovascular: Negative for palpitations and leg swelling.  Gastrointestinal: Negative for nausea and vomiting.  Genitourinary: Negative for dysuria.  Musculoskeletal: Positive for joint swelling.       Hands and elbows  Skin: Negative for rash.  Neurological: Negative for headaches.  Hematological: Does not bruise/bleed easily.  Psychiatric/Behavioral: Negative for dysphoric mood. The patient is not nervous/anxious.    Past Medical History  Diagnosis Date  . Osteoarthritis     Erosive OA-MRI of R hand negative for synovitis, only OA-Dr Trudie Reed  . Allergic rhinitis   . Hyperlipidemia   . Diverticulosis   . Osteopenia   . Hypertension   . PVC's (premature ventricular contractions)   . Heart murmur     Systolic heart murmur with Aortic valve sclerosis by ECHO  . Chronic chest pain     and DOE w normal Stress cardiolyte  . Pelvic prolapse      Family History  Problem Relation Age of Onset  . Stroke Mother   . Diabetes Mother      History   Social History  . Marital Status: Married    Spouse Name: N/A    Number of Children:  N/A  . Years of Education: N/A   Occupational History  . Not on file.   Social History Main Topics  . Smoking status: Former Smoker -- 1.00 packs/day for 25 years    Types: Cigarettes    Quit date: 06/02/1993  . Smokeless tobacco: Not on file  . Alcohol Use: No  . Drug Use: No  . Sexual Activity: Not on file   Other Topics Concern  . Not on file   Social History Narrative  . No narrative on file     Allergies  Allergen Reactions  . Ciprofloxacin Hcl   . Codeine   . Darvon [Propoxyphene]   . Demerol [Meperidine]   . Hydrogen Peroxide     White blisters     Outpatient Prescriptions Prior to Visit  Medication Sig Dispense Refill  . Acetaminophen (ACETAMIN PO) Take 200 mg by mouth 4 (four) times daily.      Marland Kitchen alendronate (FOSAMAX) 70 MG tablet Take 70 mg by mouth once a week. Take with a full glass of water on an empty stomach.      Marland Kitchen amitriptyline (ELAVIL) 10 MG tablet Take 20 mg by mouth at bedtime.       Marland Kitchen amLODipine (NORVASC) 5 MG tablet Take 1 tablet (5 mg total) by mouth daily.  90 tablet  3  . amoxicillin-clavulanate (AUGMENTIN) 875-125 MG per tablet Take 1 tablet by mouth daily.       Marland Kitchen atorvastatin (LIPITOR) 10  MG tablet Take 10 mg by mouth daily.      . cholecalciferol (VITAMIN D) 1000 UNITS tablet Take 1,000 Units by mouth daily.      Marland Kitchen docusate sodium (COLACE) 100 MG capsule Take 300 mg by mouth daily.      Marland Kitchen estrogens, conjugated, (PREMARIN) 0.625 MG tablet Take 0.625 mg by mouth daily. Take daily for 21 days then do not take for 7 days.      . Vitamins C E (VITAMIN C & E COMBINATION PO) Take by mouth.       No facility-administered medications prior to visit.        Objective:   Physical Exam Filed Vitals:   11/11/13 1506  BP: 140/92  Pulse: 83  Height: 5\' 2"  (1.575 m)  Weight: 162 lb (73.483 kg)  SpO2: 95%   Gen: Pleasant, well-nourished, in no distress,  normal affect  ENT: No lesions,  mouth clear,  oropharynx clear, no postnasal  drip  Neck: No JVD, no TMG, no carotid bruits  Lungs: No use of accessory muscles, clear without rales or rhonchi  Cardiovascular: RRR, heart sounds normal, no murmur or gallops, no peripheral edema  Musculoskeletal: No deformities, no cyanosis or clubbing  Neuro: alert, non focal  Skin: Warm, no lesions or rashes   11/04/13 --  CT MAXILLOFACIAL WITHOUT CONTRAST  TECHNIQUE:  Multidetector CT imaging of the maxillofacial structures was  performed. Multiplanar CT image reconstructions were also generated.  A small metallic BB was placed on the right temple in order to  reliably differentiate right from left.  COMPARISON: By report from 01/03/2002  FINDINGS:  The paranasal sinuses are well aerated. The frontal sinus is  hypoplastic. Diffuse mucosal thickening is noted within the ethmoid,  maxillary and sphenoid sinuses. Small air-fluid levels are noted  within the maxillary antra bilaterally consistent with acute on  chronic sinusitis. No acute bony abnormality is seen. The  ostiomeatal complexes patent bilaterally. The orbits and their  contents are within normal limits. The mastoid air cells are well  pneumatized.  IMPRESSION:  Changes consistent with acute on chronic sinusitis in the maxillary  antra. Mucosal thickening is noted in the remainder of the paranasal  sinuses       Assessment & Plan:  Dyspnea Suspect a component of COPD possibly exacerbated by her severe allergies and her sinusitis the spring. Consider also interstitial lung disease given her chest x-ray report from 11/01/13. I do not hear any crackles on exam.  - Full PFT - High-res CT chest   Sinusitis, chronic - has been treated on and off of antibiotics but not clear that she has had a full course to address chronic disease.  - She is going to follow with Dr. Redmond Baseman to review her CT scan and discuss other possible interventions - I will start nasal saline washes and a nasal steroid

## 2013-11-11 NOTE — Assessment & Plan Note (Signed)
Suspect a component of COPD possibly exacerbated by her severe allergies and her sinusitis the spring. Consider also interstitial lung disease given her chest x-ray report from 11/01/13. I do not hear any crackles on exam.  - Full PFT - High-res CT chest

## 2013-11-16 ENCOUNTER — Ambulatory Visit (INDEPENDENT_AMBULATORY_CARE_PROVIDER_SITE_OTHER)
Admission: RE | Admit: 2013-11-16 | Discharge: 2013-11-16 | Disposition: A | Discharge status: Home / Self Care | Payer: Medicare Other | Source: Ambulatory Visit | Attending: Emergency Medicine | Admitting: Emergency Medicine

## 2013-11-16 DIAGNOSIS — J849 Interstitial pulmonary disease, unspecified: Secondary | ICD-10-CM

## 2013-11-16 DIAGNOSIS — J8410 Pulmonary fibrosis, unspecified: Secondary | ICD-10-CM

## 2013-11-18 ENCOUNTER — Ambulatory Visit (HOSPITAL_COMMUNITY)
Admission: RE | Admit: 2013-11-18 | Discharge: 2013-11-18 | Disposition: A | Discharge status: Home / Self Care | Payer: Medicare Other | Source: Ambulatory Visit | Attending: Emergency Medicine | Admitting: Emergency Medicine

## 2013-11-18 DIAGNOSIS — J849 Interstitial pulmonary disease, unspecified: Secondary | ICD-10-CM

## 2013-11-18 DIAGNOSIS — J8410 Pulmonary fibrosis, unspecified: Secondary | ICD-10-CM | POA: Insufficient documentation

## 2013-11-18 MED ORDER — ALBUTEROL SULFATE (2.5 MG/3ML) 0.083% IN NEBU
2.5000 mg | INHALATION_SOLUTION | Freq: Once | RESPIRATORY_TRACT | Status: AC
  Administered 2013-11-18: 2.5 mg via RESPIRATORY_TRACT

## 2013-11-22 LAB — PULMONARY FUNCTION TEST
DL/VA % pred: 67 %
DL/VA: 2.96 ml/min/mmHg/L
DLCO UNC % PRED: 52 %
DLCO unc: 10.59 ml/min/mmHg
FEF 25-75 POST: 1.66 L/s
FEF 25-75 Pre: 1.45 L/sec
FEF2575-%CHANGE-POST: 14 %
FEF2575-%PRED-POST: 114 %
FEF2575-%Pred-Pre: 99 %
FEV1-%CHANGE-POST: 5 %
FEV1-%PRED-POST: 102 %
FEV1-%Pred-Pre: 96 %
FEV1-POST: 1.85 L
FEV1-PRE: 1.75 L
FEV1FVC-%CHANGE-POST: 3 %
FEV1FVC-%PRED-PRE: 101 %
FEV6-%Change-Post: 2 %
FEV6-%Pred-Post: 101 %
FEV6-%Pred-Pre: 99 %
FEV6-Post: 2.33 L
FEV6-Pre: 2.28 L
FEV6FVC-%Change-Post: 0 %
FEV6FVC-%PRED-PRE: 104 %
FEV6FVC-%Pred-Post: 105 %
FVC-%Change-Post: 1 %
FVC-%Pred-Post: 96 %
FVC-%Pred-Pre: 95 %
FVC-PRE: 2.3 L
FVC-Post: 2.33 L
POST FEV1/FVC RATIO: 79 %
PRE FEV6/FVC RATIO: 99 %
Post FEV6/FVC ratio: 100 %
Pre FEV1/FVC ratio: 76 %
RV % PRED: 136 %
RV: 2.93 L
TLC % pred: 113 %
TLC: 5.2 L

## 2013-12-07 ENCOUNTER — Ambulatory Visit (INDEPENDENT_AMBULATORY_CARE_PROVIDER_SITE_OTHER): Payer: Medicare Other | Admitting: Emergency Medicine

## 2013-12-07 ENCOUNTER — Encounter: Payer: Self-pay | Admitting: Emergency Medicine

## 2013-12-07 VITALS — BP 140/80 | HR 71 | Ht 61.0 in | Wt 164.6 lb

## 2013-12-07 DIAGNOSIS — R0609 Other forms of dyspnea: Secondary | ICD-10-CM

## 2013-12-07 DIAGNOSIS — R0989 Other specified symptoms and signs involving the circulatory and respiratory systems: Secondary | ICD-10-CM

## 2013-12-07 DIAGNOSIS — R0600 Dyspnea, unspecified: Secondary | ICD-10-CM

## 2013-12-07 DIAGNOSIS — J328 Other chronic sinusitis: Secondary | ICD-10-CM

## 2013-12-07 NOTE — Patient Instructions (Signed)
Please continue your nasal saline washes every day You may want to continue your fluticasone nasal spray during the Spring and Fall months.  You need to have a CXR every year. We will check a new film in June 2016. Please call us to arrange a visit at that time. Call sooner if you have any problems.

## 2013-12-07 NOTE — Progress Notes (Signed)
Subjective:    Patient ID: Jacqueline Burke, female    DOB: 10-26-1935, 78 y.o.   MRN: [id number]  HPI 78 yo former smoker (25 pk-yrs), hx of allergies, hyperlipidemia, Osteoarthritis. She is referred by Dr Inda Merlin. She has recently been evaluated for recurrent HA, nasal drainage, purulent mucous from sinuses. Has been treated with Augmentin, other abx and does better when she is on abx > lighter mucous. She has been coughing through the whole thing.   ROV 12/07/13 -- follows for COPD, cough, purulent sputum, sinus sx. Last time we started [place] and nasal steroid, continued augmentin (now finished).  We performed HRCT given a prior CXR report that questioned ILD > changes consistent with COPD + some mild apical predominant changes suggestive of NSIP pattern, no overt GGI. She has finished augmentin, continued nasal saline and nasal steroid. She has some chronic R and L back pain, seems to happen at random.   PFT 11/18/13 >> suspected mild AFL, decreased DLCO, normal volumes.    Review of Systems  Constitutional: Negative for fever and unexpected weight change.  HENT: Positive for congestion, postnasal drip and sinus pressure. Negative for dental problem, ear pain, nosebleeds, rhinorrhea, sneezing, sore throat and trouble swallowing.   Eyes: Negative for redness and itching.  Respiratory: Positive for cough and shortness of breath. Negative for chest tightness and wheezing.   Cardiovascular: Negative for palpitations and leg swelling.  Gastrointestinal: Negative for nausea and vomiting.  Genitourinary: Negative for dysuria.  Musculoskeletal: Positive for joint swelling.       Hands and elbows  Skin: Negative for rash.  Neurological: Negative for headaches.  Hematological: Does not bruise/bleed easily.  Psychiatric/Behavioral: Negative for dysphoric mood. The patient is not nervous/anxious.        Objective:   Physical Exam Filed Vitals:   12/07/13 1433  BP: 140/80  Pulse: 71  Height: 5\' 1"   (1.549 m)  Weight: 164 lb 9.6 oz (74.662 kg)  SpO2: 96%   Gen: Pleasant, well-nourished, in no distress,  normal affect  ENT: No lesions,  mouth clear,  oropharynx clear, no postnasal drip  Neck: No JVD, no TMG, no carotid bruits  Lungs: No use of accessory muscles, clear without rales or rhonchi  Cardiovascular: RRR, heart sounds normal, no murmur or gallops, no peripheral edema  Musculoskeletal: No deformities, no cyanosis or clubbing  Neuro: alert, non focal  Skin: Warm, no lesions or rashes   11/04/13 --  CT MAXILLOFACIAL WITHOUT CONTRAST  TECHNIQUE:  Multidetector CT imaging of the maxillofacial structures was  performed. Multiplanar CT image reconstructions were also generated.  A small metallic BB was placed on the right temple in order to  reliably differentiate right from left.  COMPARISON: By report from 01/03/2002  FINDINGS:  The paranasal sinuses are well aerated. The frontal sinus is  hypoplastic. Diffuse mucosal thickening is noted within the ethmoid,  maxillary and sphenoid sinuses. Small air-fluid levels are noted  within the maxillary antra bilaterally consistent with acute on  chronic sinusitis. No acute bony abnormality is seen. The  ostiomeatal complexes patent bilaterally. The orbits and their  contents are within normal limits. The mastoid air cells are well  pneumatized.  IMPRESSION:  Changes consistent with acute on chronic sinusitis in the maxillary  antra. Mucosal thickening is noted in the remainder of the paranasal  sinuses  11/16/13 >>  FINDINGS:  Mediastinum: Heart size is normal. There is no significant  pericardial fluid, thickening or pericardial calcification. There is  atherosclerosis of the thoracic aorta, the great vessels of the  mediastinum and the coronary arteries, including calcified  atherosclerotic plaque in the left main, left anterior descending,  left circumflex and right coronary arteries. No pathologically  enlarged  mediastinal are hilar lymph nodes. Please note that  accurate exclusion of hilar adenopathy is limited on noncontrast CT  scans. Esophagus is unremarkable in appearance.  Lungs/Pleura: High-resolution images demonstrate patchy areas of  subpleural reticulation which are seen rather diffusely throughout  the lungs bilaterally, with very slight apical predominance. No  significant regions of ground-glass attenuation. No traction  bronchiectasis or frank honeycombing. Inspiratory and expiratory  imaging is unremarkable. There is a background of mild diffuse  bronchial wall thickening with mild centrilobular and paraseptal  emphysema. No suspicious appearing pulmonary nodules or masses. No  acute consolidative airspace disease.  Upper Abdomen: Numerous colonic diverticulae.  Musculoskeletal: There are no aggressive appearing lytic or blastic  lesions noted in the visualized portions of the skeleton.  IMPRESSION:  1. Mild diffuse bronchial wall thickening with mild centrilobular  and paraseptal emphysema; imaging findings suggestive of underlying  COPD.  2. In addition, there are other imaging findings suggestive of  superimposed interstitial lung disease. 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 Repeat high-resolution chest CT in 1 year would be  useful to reassess for potential temporal changes in the appearance  of the lung parenchyma if clinically appropriate.  3. Atherosclerosis, including left main and 3 vessel coronary artery  disease. Assessment for potential risk factor modification, dietary  therapy or pharmacologic therapy may be warranted, if clinically  indicated.  4. Colonic diverticulosis.     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 No real indication to start BD's at this time although she may benefit at some point in the  future.   Sinusitis, chronic Improved, now off abx - continue [place] daily - fluticasone nasal spray during allergy season.

## 2013-12-07 NOTE — Assessment & Plan Note (Signed)
Improved, now off abx - continue [place] daily - fluticasone nasal spray during allergy season.

## 2013-12-07 NOTE — Assessment & Plan Note (Signed)
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 No real indication to start BD's at this time although she may benefit at some point in the future.

## 2014-02-16 ENCOUNTER — Encounter: Payer: Self-pay | Admitting: Cardiology

## 2014-02-16 ENCOUNTER — Ambulatory Visit (INDEPENDENT_AMBULATORY_CARE_PROVIDER_SITE_OTHER): Payer: Medicare Other | Admitting: Cardiology

## 2014-02-16 VITALS — BP 134/72 | HR 72 | Ht 61.0 in | Wt 156.1 lb

## 2014-02-16 DIAGNOSIS — I493 Ventricular premature depolarization: Secondary | ICD-10-CM

## 2014-02-16 DIAGNOSIS — R0609 Other forms of dyspnea: Secondary | ICD-10-CM

## 2014-02-16 DIAGNOSIS — R600 Localized edema: Secondary | ICD-10-CM

## 2014-02-16 DIAGNOSIS — I119 Hypertensive heart disease without heart failure: Secondary | ICD-10-CM

## 2014-02-16 DIAGNOSIS — R0989 Other specified symptoms and signs involving the circulatory and respiratory systems: Secondary | ICD-10-CM

## 2014-02-16 DIAGNOSIS — R609 Edema, unspecified: Secondary | ICD-10-CM

## 2014-02-16 DIAGNOSIS — R0600 Dyspnea, unspecified: Secondary | ICD-10-CM

## 2014-02-16 DIAGNOSIS — I4949 Other premature depolarization: Secondary | ICD-10-CM

## 2014-02-16 NOTE — Progress Notes (Signed)
[place], [place], [place]  [address] Phone: [phone] Fax:  [phone]  Date:  02/16/2014   ID:  Jacqueline Burke, DOB [birth year], MRN [id number]  PCP:  Marjorie Smolder, MD  Cardiologist:  Fransico Him, MD     History of Present Illness: Jacqueline Burke is a 78 y.o. female with a history of HTN, PVC's and chronic noncardiac CP with normal stress test who presents today for followup. She is doing well. She denies any chest pain, SOB, DOE, dizziness, palpitations or syncope.  She occasionally has some mild LE edema if she stands too long.    Wt Readings from Last 3 Encounters:  02/16/14 156 lb 1.9 oz (70.816 kg)  12/07/13 164 lb 9.6 oz (74.662 kg)  11/11/13 162 lb (73.483 kg)     Past Medical History  Diagnosis Date  . Osteoarthritis     Erosive OA-MRI of R hand negative for synovitis, only OA-Dr Trudie Reed  . Allergic rhinitis   . Hyperlipidemia   . Diverticulosis   . Osteopenia   . Hypertension   . PVC's (premature ventricular contractions)   . Heart murmur     Systolic heart murmur with Aortic valve sclerosis by ECHO  . Chronic chest pain     and DOE w normal Stress cardiolyte  . Pelvic prolapse     Current Outpatient Prescriptions  Medication Sig Dispense Refill  . Acetaminophen (ACETAMIN PO) Take 200 mg by mouth as directed. 2-6 Tablets daily----Arthritis      . alendronate (FOSAMAX) 70 MG tablet Take 70 mg by mouth once a week. Take with a full glass of water on an empty stomach.      Marland Kitchen amitriptyline (ELAVIL) 10 MG tablet Take 20 mg by mouth at bedtime. Uses for Chronic Pain      . amLODipine (NORVASC) 5 MG tablet Take 1 tablet (5 mg total) by mouth daily.  90 tablet  3  . Ascorbic Acid (VITAMIN C PO) Take 1 tablet by mouth daily.      Marland Kitchen atorvastatin (LIPITOR) 10 MG tablet Take 10 mg by mouth daily.      . cholecalciferol (VITAMIN D) 1000 UNITS tablet Take 1,000 Units by mouth daily.      Marland Kitchen docusate sodium (COLACE) 100 MG capsule Take 300 mg by mouth  daily.      Marland Kitchen FIBER PO Take 2 tablets by mouth daily.      . fluticasone (FLONASE) 50 MCG/ACT nasal spray Place 2 sprays into both nostrils daily.  16 g  2  . gabapentin (NEURONTIN) 300 MG capsule Take 1 capsule by mouth daily. FOR SCIATIC (LEG) PAIN      . HYDROcodone-acetaminophen (NORCO/VICODIN) 5-325 MG per tablet Take 1 tablet by mouth as needed. FOR PAIN       No current facility-administered medications for this visit.    Allergies:    Allergies  Allergen Reactions  . Ciprofloxacin Hcl   . Codeine   . Darvon [Propoxyphene]   . Demerol [Meperidine]   . Hydrogen Peroxide     White blisters  . Tramadol     "ITCHY ON THE INSIDE"    Social History:  The patient  reports that she quit smoking about 20 years ago. Her smoking use included Cigarettes. She has a 25 pack-year smoking history. She does not have any smokeless tobacco history on file. She reports that she does not drink alcohol or use illicit drugs.   Family History:  The patient's  family history includes Diabetes in her mother; Stroke in her mother.   ROS:  Please see the history of present illness.      All other systems reviewed and negative.   PHYSICAL EXAM: VS:  BP 134/72  Pulse 72  Ht 5\' 1"  (1.549 m)  Wt 156 lb 1.9 oz (70.816 kg)  BMI 29.51 kg/m2  SpO2 98% Well nourished, well developed, in no acute distress HEENT: normal Neck: no JVD Cardiac:  normal S1, S2; RRR; no murmur Lungs:  clear to auscultation bilaterally, no wheezing, rhonchi or rales Abd: soft, nontender, no hepatomegaly Ext: no edema Skin: warm and dry Neuro:  CNs 2-12 intact, no focal abnormalities noted     ASSESSMENT AND PLAN:  1.  HTN - well controlled - continue Amlodipine  2. PVC's - asymptomatic  3. Chronic CP and SOB which has now resolved  4.  Chronic intermittent LE edema - none on exam today  Followup with me in 1 year  Signed, Fransico Him, MD 02/16/2014 10:57 AM

## 2014-02-16 NOTE — Patient Instructions (Signed)
Your physician recommends that you continue on your current medications as directed. Please refer to the Current Medication list given to you today.  Your physician wants you to follow-up in: 1 year with Dr. Turner. You will receive a reminder letter in the mail two months in advance. If you don't receive a letter, please call our office to schedule the follow-up appointment.  

## 2014-03-06 ENCOUNTER — Other Ambulatory Visit (HOSPITAL_COMMUNITY): Payer: Self-pay | Admitting: Family Medicine

## 2014-03-06 DIAGNOSIS — Z1231 Encounter for screening mammogram for malignant neoplasm of breast: Secondary | ICD-10-CM

## 2014-03-15 ENCOUNTER — Ambulatory Visit (HOSPITAL_COMMUNITY)
Admission: RE | Admit: 2014-03-15 | Discharge: 2014-03-15 | Disposition: A | Discharge status: Home / Self Care | Payer: Medicare Other | Source: Ambulatory Visit | Attending: Family Medicine | Admitting: Family Medicine

## 2014-03-15 DIAGNOSIS — Z1231 Encounter for screening mammogram for malignant neoplasm of breast: Secondary | ICD-10-CM | POA: Diagnosis present

## 2014-05-01 ENCOUNTER — Ambulatory Visit
Admission: RE | Admit: 2014-05-01 | Discharge: 2014-05-01 | Disposition: A | Discharge status: Home / Self Care | Payer: Medicare Other | Source: Ambulatory Visit | Attending: Family Medicine | Admitting: Family Medicine

## 2014-05-01 ENCOUNTER — Other Ambulatory Visit: Payer: Self-pay | Admitting: Family Medicine

## 2014-05-01 DIAGNOSIS — W19XXXA Unspecified fall, initial encounter: Secondary | ICD-10-CM

## 2014-08-14 ENCOUNTER — Ambulatory Visit
Admission: RE | Admit: 2014-08-14 | Discharge: 2014-08-14 | Disposition: A | Discharge status: Home / Self Care | Payer: Medicare Other | Source: Ambulatory Visit | Attending: Family Medicine | Admitting: Family Medicine

## 2014-08-14 ENCOUNTER — Other Ambulatory Visit: Payer: Self-pay | Admitting: Family Medicine

## 2014-08-14 DIAGNOSIS — R059 Cough, unspecified: Secondary | ICD-10-CM

## 2014-08-14 DIAGNOSIS — R05 Cough: Secondary | ICD-10-CM

## 2014-12-06 ENCOUNTER — Encounter: Payer: Self-pay | Admitting: Cardiology

## 2015-02-19 ENCOUNTER — Encounter: Payer: Self-pay | Admitting: Cardiology

## 2015-02-19 ENCOUNTER — Ambulatory Visit (INDEPENDENT_AMBULATORY_CARE_PROVIDER_SITE_OTHER): Payer: Medicare Other | Admitting: Cardiology

## 2015-02-19 VITALS — BP 152/76 | HR 67 | Ht 64.0 in | Wt 162.4 lb

## 2015-02-19 DIAGNOSIS — I119 Hypertensive heart disease without heart failure: Secondary | ICD-10-CM

## 2015-02-19 DIAGNOSIS — I493 Ventricular premature depolarization: Secondary | ICD-10-CM | POA: Diagnosis not present

## 2015-02-19 DIAGNOSIS — R609 Edema, unspecified: Secondary | ICD-10-CM | POA: Diagnosis not present

## 2015-02-19 DIAGNOSIS — R600 Localized edema: Secondary | ICD-10-CM

## 2015-02-19 NOTE — Addendum Note (Signed)
Addended by: Fransico Him R on: 02/19/2015 03:49 PM   Modules accepted: Miquel Dunn

## 2015-02-19 NOTE — Patient Instructions (Signed)
Medication Instructions:  Your physician recommends that you continue on your current medications as directed. Please refer to the Current Medication list given to you today.   Labwork: None  Testing/Procedures: None  Follow-Up: Your physician wants you to follow-up in: 1 year with Dr. Turner. You will receive a reminder letter in the mail two months in advance. If you don't receive a letter, please call our office to schedule the follow-up appointment.   Any Other Special Instructions Will Be Listed Below (If Applicable). Please check your BLOOD PRESSURE daily for one week and call with results. 

## 2015-02-19 NOTE — Progress Notes (Addendum)
Cardiology Office Note   Date:  02/19/2015   ID:  [name], DOB 1935-06-17, MRN [id number]  PCP:  Marjorie Smolder, MD    Chief Complaint  Patient presents with  . Hypertension  . Irregular Heart Beat      History of Present Illness: Jacqueline Burke is a 79 y.o. female with a history of HTN, PVC's and chronic noncardiac CP with normal stress test who presents today for followup. She is doing well. She denies any chest pain, SOB, DOE, dizziness, palpitations or syncope. She occasionally has some mild LE edema if she stands too long.     Past Medical History  Diagnosis Date  . Osteoarthritis     Erosive OA-MRI of R hand negative for synovitis, only OA-Dr Trudie Reed  . Allergic rhinitis   . Hyperlipidemia   . Diverticulosis   . Osteopenia   . Hypertension   . PVC's (premature ventricular contractions)   . Heart murmur     Systolic heart murmur with Aortic valve sclerosis by ECHO  . Chronic chest pain     and DOE w normal Stress cardiolyte  . Pelvic prolapse     Past Surgical History  Procedure Laterality Date  . Carpal tunnel release  2002  . Bunionectomy  08/2006    R foot and hammer roe right second toe  . Total knee arthroplasty Left 08/2007  . Total knee arthroplasty Right 03/2010     Current Outpatient Prescriptions  Medication Sig Dispense Refill  . Acetaminophen (ACETAMIN PO) Take 200 mg by mouth as directed. 2-6 Tablets daily----Arthritis    . alendronate (FOSAMAX) 70 MG tablet Take 70 mg by mouth once a week. Take with a full glass of water on an empty stomach.    Marland Kitchen amitriptyline (ELAVIL) 10 MG tablet Take 20 mg by mouth at bedtime. Uses for Chronic Pain    . amLODipine (NORVASC) 5 MG tablet Take 1 tablet (5 mg total) by mouth daily. 90 tablet 3  . Ascorbic Acid (VITAMIN C PO) Take 1 tablet by mouth daily.    Marland Kitchen atorvastatin (LIPITOR) 10 MG tablet Take 10 mg by mouth daily.    . cholecalciferol (VITAMIN D) 1000 UNITS tablet Take 1,000  Units by mouth daily.    Marland Kitchen docusate sodium (COLACE) 100 MG capsule Take 300 mg by mouth daily.    Marland Kitchen FIBER PO Take 2 tablets by mouth daily.    . fluticasone (FLONASE) 50 MCG/ACT nasal spray Place 2 sprays into both nostrils daily. 16 g 2  . gabapentin (NEURONTIN) 300 MG capsule Take 1 capsule by mouth daily. FOR SCIATIC (LEG) PAIN    . HYDROcodone-acetaminophen (NORCO/VICODIN) 5-325 MG per tablet Take 1 tablet by mouth as needed. FOR PAIN     No current facility-administered medications for this visit.    Allergies:   Ciprofloxacin hcl; Codeine; Darvon; Demerol; Hydrogen peroxide; and Tramadol    Social History:  The patient  reports that she quit smoking about 21 years ago. Her smoking use included Cigarettes. She has a 25 pack-year smoking history. She does not have any smokeless tobacco history on file. She reports that she does not drink alcohol or use illicit drugs.   Family History:  The patient's family history includes Diabetes in her mother; Stroke in her mother.    ROS:  Please see the history of present illness.   Otherwise, review of systems  are positive for muscle pain from arthritis.   All other systems are reviewed and negative.    PHYSICAL EXAM: VS:  BP 152/76 mmHg  Pulse 67  Ht 5\' 4"  (1.626 m)  Wt 162 lb 6.4 oz (73.664 kg)  BMI 27.86 kg/m2 , BMI Body mass index is 27.86 kg/(m^2). GEN: Well nourished, well developed, in no acute distress HEENT: normal Neck: no JVD, carotid bruits, or masses Cardiac: RRR; no murmurs, rubs, or gallops,no edema  Respiratory:  clear to auscultation bilaterally, normal work of breathing GI: soft, nontender, nondistended, + BS MS: no deformity or atrophy Skin: warm and dry, no rash Neuro:  Strength and sensation are intact Psych: euthymic mood, full affect   EKG:  EKG was ordered today and showed NSR with no ST changes    Recent Labs: No results found for requested labs within last 365 days.    Lipid Panel No results found  for: CHOL, TRIG, HDL, CHOLHDL, VLDL, LDLCALC, LDLDIRECT    Wt Readings from Last 3 Encounters:  02/19/15 162 lb 6.4 oz (73.664 kg)  02/16/14 156 lb 1.9 oz (70.816 kg)  12/07/13 164 lb 9.6 oz (74.662 kg)     ASSESSMENT AND PLAN:  1. HTN - borderline controlled.  I have asked her to check her BP daily for a week and call with resulst.  - continue Amlodipine  2. PVC's - asymptomatic  3. Chronic CP and SOB which has now resolved  4. Chronic intermittent LE edema - none on exam today   Current medicines are reviewed at length with the patient today.  The patient does not have concerns regarding medicines.  The following changes have been made:  no change  Labs/ tests ordered today: See above Assessment and Plan No orders of the defined types were placed in this encounter.     Disposition:   FU with me in 1 year  Signed, Sueanne Margarita, MD  02/19/2015 3:21 PM    [facility] [place], [place], [place]  [address] Phone: [phone]; Fax: [phone]

## 2015-02-23 ENCOUNTER — Other Ambulatory Visit: Payer: Self-pay

## 2015-02-23 DIAGNOSIS — Z1231 Encounter for screening mammogram for malignant neoplasm of breast: Secondary | ICD-10-CM

## 2015-02-28 ENCOUNTER — Telehealth: Payer: Self-pay | Admitting: Cardiology

## 2015-02-28 NOTE — Telephone Encounter (Signed)
BPs and HRs to Dr. Radford Pax for review.

## 2015-02-28 NOTE — Telephone Encounter (Signed)
Pt c/o BP issue: STAT if pt c/o blurred vision, one-sided weakness or slurred speech  1. What are your last 5 BP readings?  9.20.16   135/80  Pulse 80   morning 9.20.16    138/83  Pulse 71  evening 9.21.16    140/85   Pulse 70  Morning 9.21.16    137/83   Pulse 67 evening 9.22.16    134/74   Pulse 68 morning 9.22.16    141/84   Pulse 63   Evening 9.23.16    139/91   Pulse 65 morning 9.23.16    137/63   Pulse 50  Evening 9.24.16    179/91   Pulse 66 morning 9.24.16    131/85   Pulse 67 evening 9.25.16    149/80   Pulse 55 morning 9.25.16    138/90 Pulse 74 evening 9.26.16    145/95   Pulse 78 morning 9.26.16    142/84   Pulse 70 evening    2. Are you having any other symptoms (ex. Dizziness, headache, blurred vision, passed out)? No  3. What is your BP issue? Pt was told by Dr Radford Pax to call and give 1wk of Bp readings.

## 2015-02-28 NOTE — Telephone Encounter (Signed)
BPs are stable no change in meds

## 2015-02-28 NOTE — Telephone Encounter (Signed)
Instructed patient to continue current medications.

## 2015-03-26 ENCOUNTER — Ambulatory Visit
Admission: RE | Admit: 2015-03-26 | Discharge: 2015-03-26 | Disposition: A | Discharge status: Home / Self Care | Payer: Medicare Other | Source: Ambulatory Visit

## 2015-03-26 DIAGNOSIS — Z1231 Encounter for screening mammogram for malignant neoplasm of breast: Secondary | ICD-10-CM

## 2015-07-30 DIAGNOSIS — M154 Erosive (osteo)arthritis: Secondary | ICD-10-CM | POA: Diagnosis not present

## 2015-07-30 DIAGNOSIS — I700 Atherosclerosis of aorta: Secondary | ICD-10-CM | POA: Diagnosis not present

## 2015-07-30 DIAGNOSIS — J309 Allergic rhinitis, unspecified: Secondary | ICD-10-CM | POA: Diagnosis not present

## 2015-07-30 DIAGNOSIS — E7800 Pure hypercholesterolemia, unspecified: Secondary | ICD-10-CM | POA: Diagnosis not present

## 2015-07-30 DIAGNOSIS — I10 Essential (primary) hypertension: Secondary | ICD-10-CM | POA: Diagnosis not present

## 2015-07-30 DIAGNOSIS — I6529 Occlusion and stenosis of unspecified carotid artery: Secondary | ICD-10-CM | POA: Diagnosis not present

## 2015-07-30 DIAGNOSIS — Z0000 Encounter for general adult medical examination without abnormal findings: Secondary | ICD-10-CM | POA: Diagnosis not present

## 2015-08-01 ENCOUNTER — Other Ambulatory Visit: Payer: Self-pay | Admitting: Family Medicine

## 2015-08-01 DIAGNOSIS — I6529 Occlusion and stenosis of unspecified carotid artery: Secondary | ICD-10-CM

## 2015-08-06 ENCOUNTER — Ambulatory Visit
Admission: RE | Admit: 2015-08-06 | Discharge: 2015-08-06 | Disposition: A | Discharge status: Home / Self Care | Payer: PPO | Source: Ambulatory Visit | Attending: Family Medicine | Admitting: Family Medicine

## 2015-08-06 DIAGNOSIS — I6523 Occlusion and stenosis of bilateral carotid arteries: Secondary | ICD-10-CM | POA: Diagnosis not present

## 2015-08-06 DIAGNOSIS — I6529 Occlusion and stenosis of unspecified carotid artery: Secondary | ICD-10-CM

## 2015-09-17 DIAGNOSIS — J209 Acute bronchitis, unspecified: Secondary | ICD-10-CM | POA: Diagnosis not present

## 2015-09-17 DIAGNOSIS — J111 Influenza due to unidentified influenza virus with other respiratory manifestations: Secondary | ICD-10-CM | POA: Diagnosis not present

## 2015-09-17 DIAGNOSIS — M5432 Sciatica, left side: Secondary | ICD-10-CM | POA: Diagnosis not present

## 2015-11-14 DIAGNOSIS — M545 Low back pain: Secondary | ICD-10-CM | POA: Diagnosis not present

## 2015-11-14 DIAGNOSIS — M154 Erosive (osteo)arthritis: Secondary | ICD-10-CM | POA: Diagnosis not present

## 2015-11-14 DIAGNOSIS — M150 Primary generalized (osteo)arthritis: Secondary | ICD-10-CM | POA: Diagnosis not present

## 2015-11-26 DIAGNOSIS — M25562 Pain in left knee: Secondary | ICD-10-CM | POA: Diagnosis not present

## 2015-11-26 DIAGNOSIS — M5442 Lumbago with sciatica, left side: Secondary | ICD-10-CM | POA: Diagnosis not present

## 2016-01-03 ENCOUNTER — Other Ambulatory Visit: Payer: Self-pay | Admitting: Orthopedic Surgery

## 2016-01-03 DIAGNOSIS — M545 Low back pain: Secondary | ICD-10-CM

## 2016-01-12 ENCOUNTER — Ambulatory Visit
Admission: RE | Admit: 2016-01-12 | Discharge: 2016-01-12 | Disposition: A | Discharge status: Home / Self Care | Payer: PPO | Source: Ambulatory Visit | Attending: Orthopedic Surgery | Admitting: Orthopedic Surgery

## 2016-01-12 DIAGNOSIS — M5126 Other intervertebral disc displacement, lumbar region: Secondary | ICD-10-CM | POA: Diagnosis not present

## 2016-01-12 DIAGNOSIS — M545 Low back pain: Secondary | ICD-10-CM

## 2016-02-06 DIAGNOSIS — M47816 Spondylosis without myelopathy or radiculopathy, lumbar region: Secondary | ICD-10-CM | POA: Diagnosis not present

## 2016-02-06 DIAGNOSIS — M545 Low back pain: Secondary | ICD-10-CM | POA: Diagnosis not present

## 2016-02-07 DIAGNOSIS — M7989 Other specified soft tissue disorders: Secondary | ICD-10-CM | POA: Diagnosis not present

## 2016-02-19 NOTE — Progress Notes (Signed)
Cardiology Office Note    Date:  02/20/2016   ID:  Jacqueline Burke, DOB 1935-09-24, MRN [id number]  PCP:  Marjorie Smolder, MD  Cardiologist:  Fransico Him, MD   Chief Complaint  Patient presents with  . Hypertension    History of Present Illness:  Jacqueline Burke is a 80 y.o. female with a history of HTN, PVC's and chronic noncardiac CP with normal stress test who presents today for followup. She is doing well. She denies any chest pain, SOB, DOE, dizziness, palpitations or syncope. She occasionally has some mild LE edema if she stands too long.   Past Medical History:  Diagnosis Date  . Allergic rhinitis   . Chronic chest pain    and DOE w normal Stress cardiolyte  . Diverticulosis   . Heart murmur    Systolic heart murmur with Aortic valve sclerosis by ECHO  . Hyperlipidemia   . Hypertension   . Osteoarthritis    Erosive OA-MRI of R hand negative for synovitis, only OA-Dr Trudie Reed  . Osteopenia   . Pelvic prolapse   . PVC's (premature ventricular contractions)     Past Surgical History:  Procedure Laterality Date  . BUNIONECTOMY  08/2006   R foot and hammer roe right second toe  . CARPAL TUNNEL RELEASE  2002  . TOTAL KNEE ARTHROPLASTY Left 08/2007  . TOTAL KNEE ARTHROPLASTY Right 03/2010    Current Medications: Outpatient Medications Prior to Visit  Medication Sig Dispense Refill  . Acetaminophen (ACETAMIN PO) Take 200 mg by mouth as directed. 2-6 Tablets daily----Arthritis    . amitriptyline (ELAVIL) 10 MG tablet Take 20 mg by mouth at bedtime. Uses for Chronic Pain    . amLODipine (NORVASC) 5 MG tablet Take 1 tablet (5 mg total) by mouth daily. 90 tablet 3  . Ascorbic Acid (VITAMIN C PO) Take 1 tablet by mouth daily.    Marland Kitchen atorvastatin (LIPITOR) 10 MG tablet Take 10 mg by mouth daily.    . cholecalciferol (VITAMIN D) 1000 UNITS tablet Take 1,000 Units by mouth daily.    Marland Kitchen docusate sodium (COLACE) 100 MG capsule Take 300 mg by mouth daily.    Marland Kitchen FIBER PO Take 2  tablets by mouth daily.    . fluticasone (FLONASE) 50 MCG/ACT nasal spray Place 2 sprays into both nostrils daily. 16 g 2  . alendronate (FOSAMAX) 70 MG tablet Take 70 mg by mouth once a week. Take with a full glass of water on an empty stomach.    . gabapentin (NEURONTIN) 300 MG capsule Take 1 capsule by mouth daily. FOR SCIATIC (LEG) PAIN    . HYDROcodone-acetaminophen (NORCO/VICODIN) 5-325 MG per tablet Take 1 tablet by mouth as needed. FOR PAIN     No facility-administered medications prior to visit.      Allergies:   Ciprofloxacin; Codeine; Demerol [meperidine]; Other; Ciprofloxacin hcl; Darvon [propoxyphene]; Hydrogen peroxide; and Tramadol   Social History   Social History  . Marital status: Married    Spouse name: N/A  . Number of children: N/A  . Years of education: N/A   Social History Main Topics  . Smoking status: Former Smoker    Packs/day: 1.00    Years: 25.00    Types: Cigarettes    Quit date: 06/02/1993  . Smokeless tobacco: Never Used  . Alcohol use No  . Drug use: No  . Sexual activity: Not Asked   Other Topics Concern  . None   Social History Narrative  .  None     Family History:  The patient's family history includes Diabetes in her mother; Stroke in her mother.   ROS:   Please see the history of present illness.    ROS All other systems reviewed and are negative.  No flowsheet data found.     PHYSICAL EXAM:   VS:  BP 140/84   Pulse 63   Ht 5\' 4"  (1.626 m)   Wt 165 lb 12.8 oz (75.2 kg)   BMI 28.46 kg/m    GEN: Well nourished, well developed, in no acute distress  HEENT: normal  Neck: no JVD, carotid bruits, or masses Cardiac: RRR; no murmurs, rubs, or gallops,no edema.  Intact distal pulses bilaterally.  Respiratory:  clear to auscultation bilaterally, normal work of breathing GI: soft, nontender, nondistended, + BS MS: no deformity or atrophy  Skin: warm and dry, no rash Neuro:  Alert and Oriented x 3, Strength and sensation are  intact Psych: euthymic mood, full affect  Wt Readings from Last 3 Encounters:  02/20/16 165 lb 12.8 oz (75.2 kg)  02/19/15 162 lb 6.4 oz (73.7 kg)  02/16/14 156 lb 1.9 oz (70.8 kg)      Studies/Labs Reviewed:   EKG:  EKG is ordered today.  The ekg ordered today demonstrates NSR with no ST changes  Recent Labs: No results found for requested labs within last 8760 hours.   Lipid Panel No results found for: CHOL, TRIG, HDL, CHOLHDL, VLDL, LDLCALC, LDLDIRECT  Additional studies/ records that were reviewed today include:  none    ASSESSMENT:    1. Benign hypertensive heart disease without heart failure   2. PVC's (premature ventricular contractions)   3. Edema extremities      PLAN:  In order of problems listed above:  1. HTN - BP controlled on current meds.  Continue amlodipine 2. PVC's - asymptomatic. 3. LE edema stable.    Medication Adjustments/Labs and Tests Ordered: Current medicines are reviewed at length with the patient today.  Concerns regarding medicines are outlined above.  Medication changes, Labs and Tests ordered today are listed in the Patient Instructions below.  There are no Patient Instructions on file for this visit.   Signed, Fransico Him, MD  02/20/2016 8:47 AM    [address], [place], [place]  [address] Phone: [phone]; Fax: [phone]

## 2016-02-20 ENCOUNTER — Ambulatory Visit (INDEPENDENT_AMBULATORY_CARE_PROVIDER_SITE_OTHER): Payer: PPO | Admitting: Cardiology

## 2016-02-20 ENCOUNTER — Encounter: Payer: Self-pay | Admitting: Cardiology

## 2016-02-20 ENCOUNTER — Encounter (INDEPENDENT_AMBULATORY_CARE_PROVIDER_SITE_OTHER): Payer: Self-pay

## 2016-02-20 VITALS — BP 140/84 | HR 63 | Ht 64.0 in | Wt 165.8 lb

## 2016-02-20 DIAGNOSIS — R609 Edema, unspecified: Secondary | ICD-10-CM

## 2016-02-20 DIAGNOSIS — I119 Hypertensive heart disease without heart failure: Secondary | ICD-10-CM | POA: Diagnosis not present

## 2016-02-20 DIAGNOSIS — R600 Localized edema: Secondary | ICD-10-CM

## 2016-02-20 DIAGNOSIS — I493 Ventricular premature depolarization: Secondary | ICD-10-CM | POA: Diagnosis not present

## 2016-02-20 NOTE — Patient Instructions (Signed)
Your physician recommends that you continue on your current medications as directed. Please refer to the Current Medication list given to you today.   Your physician wants you to follow-up in: YEAR WITH DR TURNER You will receive a reminder letter in the mail two months in advance. If you don't receive a letter, please call our office to schedule the follow-up appointment.  

## 2016-02-21 DIAGNOSIS — Z23 Encounter for immunization: Secondary | ICD-10-CM | POA: Diagnosis not present

## 2016-03-11 ENCOUNTER — Other Ambulatory Visit (HOSPITAL_BASED_OUTPATIENT_CLINIC_OR_DEPARTMENT_OTHER): Payer: Self-pay | Admitting: Family Medicine

## 2016-03-11 DIAGNOSIS — Z1231 Encounter for screening mammogram for malignant neoplasm of breast: Secondary | ICD-10-CM

## 2016-03-27 ENCOUNTER — Ambulatory Visit
Admission: RE | Admit: 2016-03-27 | Discharge: 2016-03-27 | Disposition: A | Discharge status: Home / Self Care | Payer: PPO | Source: Ambulatory Visit | Attending: Family Medicine | Admitting: Family Medicine

## 2016-03-27 DIAGNOSIS — Z1231 Encounter for screening mammogram for malignant neoplasm of breast: Secondary | ICD-10-CM | POA: Diagnosis not present

## 2016-07-31 DIAGNOSIS — R05 Cough: Secondary | ICD-10-CM | POA: Diagnosis not present

## 2016-07-31 DIAGNOSIS — J329 Chronic sinusitis, unspecified: Secondary | ICD-10-CM | POA: Diagnosis not present

## 2016-08-07 DIAGNOSIS — E559 Vitamin D deficiency, unspecified: Secondary | ICD-10-CM | POA: Diagnosis not present

## 2016-08-07 DIAGNOSIS — Z0000 Encounter for general adult medical examination without abnormal findings: Secondary | ICD-10-CM | POA: Diagnosis not present

## 2016-08-07 DIAGNOSIS — I6529 Occlusion and stenosis of unspecified carotid artery: Secondary | ICD-10-CM | POA: Diagnosis not present

## 2016-08-07 DIAGNOSIS — I700 Atherosclerosis of aorta: Secondary | ICD-10-CM | POA: Diagnosis not present

## 2016-08-07 DIAGNOSIS — M810 Age-related osteoporosis without current pathological fracture: Secondary | ICD-10-CM | POA: Diagnosis not present

## 2016-08-07 DIAGNOSIS — M154 Erosive (osteo)arthritis: Secondary | ICD-10-CM | POA: Diagnosis not present

## 2016-08-07 DIAGNOSIS — G8929 Other chronic pain: Secondary | ICD-10-CM | POA: Diagnosis not present

## 2016-08-07 DIAGNOSIS — M5442 Lumbago with sciatica, left side: Secondary | ICD-10-CM | POA: Diagnosis not present

## 2016-08-07 DIAGNOSIS — N183 Chronic kidney disease, stage 3 (moderate): Secondary | ICD-10-CM | POA: Diagnosis not present

## 2016-08-07 DIAGNOSIS — E7800 Pure hypercholesterolemia, unspecified: Secondary | ICD-10-CM | POA: Diagnosis not present

## 2016-08-07 DIAGNOSIS — I10 Essential (primary) hypertension: Secondary | ICD-10-CM | POA: Diagnosis not present

## 2016-08-08 ENCOUNTER — Telehealth (INDEPENDENT_AMBULATORY_CARE_PROVIDER_SITE_OTHER): Payer: Self-pay | Admitting: Orthopedic Surgery

## 2016-08-08 NOTE — Telephone Encounter (Signed)
Dr. Inda Merlin office called for 2017 notes. I faxed [phone]

## 2016-09-17 DIAGNOSIS — M810 Age-related osteoporosis without current pathological fracture: Secondary | ICD-10-CM | POA: Diagnosis not present

## 2016-10-10 DIAGNOSIS — R350 Frequency of micturition: Secondary | ICD-10-CM | POA: Diagnosis not present

## 2016-10-10 DIAGNOSIS — N3946 Mixed incontinence: Secondary | ICD-10-CM | POA: Diagnosis not present

## 2016-10-10 DIAGNOSIS — R351 Nocturia: Secondary | ICD-10-CM | POA: Diagnosis not present

## 2016-10-15 DIAGNOSIS — H524 Presbyopia: Secondary | ICD-10-CM | POA: Diagnosis not present

## 2016-11-13 DIAGNOSIS — Z6832 Body mass index (BMI) 32.0-32.9, adult: Secondary | ICD-10-CM | POA: Diagnosis not present

## 2016-11-13 DIAGNOSIS — M545 Low back pain: Secondary | ICD-10-CM | POA: Diagnosis not present

## 2016-11-13 DIAGNOSIS — M150 Primary generalized (osteo)arthritis: Secondary | ICD-10-CM | POA: Diagnosis not present

## 2016-11-13 DIAGNOSIS — M154 Erosive (osteo)arthritis: Secondary | ICD-10-CM | POA: Diagnosis not present

## 2016-11-13 DIAGNOSIS — E669 Obesity, unspecified: Secondary | ICD-10-CM | POA: Diagnosis not present

## 2016-11-14 DIAGNOSIS — R350 Frequency of micturition: Secondary | ICD-10-CM | POA: Diagnosis not present

## 2016-11-14 DIAGNOSIS — N3946 Mixed incontinence: Secondary | ICD-10-CM | POA: Diagnosis not present

## 2016-12-01 ENCOUNTER — Telehealth (INDEPENDENT_AMBULATORY_CARE_PROVIDER_SITE_OTHER): Payer: Self-pay | Admitting: Orthopedic Surgery

## 2016-12-01 DIAGNOSIS — M5416 Radiculopathy, lumbar region: Secondary | ICD-10-CM

## 2016-12-01 DIAGNOSIS — L570 Actinic keratosis: Secondary | ICD-10-CM | POA: Diagnosis not present

## 2016-12-01 NOTE — Telephone Encounter (Signed)
Please advise which Dr. Sharol Given would like to order

## 2016-12-01 NOTE — Telephone Encounter (Signed)
Epiphany Seltzer Self [phone]  Jalilah stop by and wanted to schedule MRI or another injection, Please call and advise patient.

## 2016-12-01 NOTE — Telephone Encounter (Signed)
Call patient and have her follow-up with Dr. Ernestina Patches for a injection. Do not think she needs another MRI scan

## 2016-12-02 NOTE — Telephone Encounter (Signed)
Order placed for repeat transforminal injection left L5, per MD no MRI not necessary. I have called patient to make her aware.

## 2016-12-23 ENCOUNTER — Encounter (INDEPENDENT_AMBULATORY_CARE_PROVIDER_SITE_OTHER): Payer: Self-pay | Admitting: Physical Medicine and Rehabilitation

## 2016-12-23 ENCOUNTER — Ambulatory Visit (INDEPENDENT_AMBULATORY_CARE_PROVIDER_SITE_OTHER): Payer: PPO | Admitting: Physical Medicine and Rehabilitation

## 2016-12-23 ENCOUNTER — Telehealth (INDEPENDENT_AMBULATORY_CARE_PROVIDER_SITE_OTHER): Payer: Self-pay

## 2016-12-23 VITALS — BP 162/76 | HR 74

## 2016-12-23 DIAGNOSIS — M47816 Spondylosis without myelopathy or radiculopathy, lumbar region: Secondary | ICD-10-CM

## 2016-12-23 DIAGNOSIS — G8929 Other chronic pain: Secondary | ICD-10-CM | POA: Diagnosis not present

## 2016-12-23 DIAGNOSIS — M25562 Pain in left knee: Secondary | ICD-10-CM

## 2016-12-23 DIAGNOSIS — M5416 Radiculopathy, lumbar region: Secondary | ICD-10-CM | POA: Diagnosis not present

## 2016-12-23 DIAGNOSIS — M4316 Spondylolisthesis, lumbar region: Secondary | ICD-10-CM | POA: Diagnosis not present

## 2016-12-23 NOTE — Progress Notes (Deleted)
Increased left side low back pain for 6 months. "Its my hip." Denies any radiating pain or groin pain.  Pain comes and goes and can't relate to any certain movement.

## 2016-12-23 NOTE — Telephone Encounter (Signed)
Needs [facility] precert for rpt injection. Pt scheduled for 01/05/17.

## 2016-12-24 ENCOUNTER — Encounter (INDEPENDENT_AMBULATORY_CARE_PROVIDER_SITE_OTHER): Payer: Self-pay | Admitting: Physical Medicine and Rehabilitation

## 2016-12-24 NOTE — Telephone Encounter (Signed)
Faxed auth form with notes to [facility]

## 2016-12-24 NOTE — Progress Notes (Signed)
Jacqueline Burke - 81 y.o. female MRN [id number]  Date of birth: 1935/06/22  Office Visit Note: Visit Date: 12/23/2016 PCP: Darcus Austin, MD Referred by: Darcus Austin, MD  Subjective: Chief Complaint  Patient presents with  . Lower Back - Pain   HPI: Jacqueline Burke is a 81 year old female accompanied by her husband provides the history. I last saw her in the office in September 2017 and completed a left L5 transforaminal epidural steroid injection. At the time she had seen Dr. Sharol Given in the office for follow-up orthopedic care was having what she thought was left hip pain which referred posterior laterally in the thigh more of an L5 or L4 distribution. She also at the time was having a lot of left knee pain. She has prior left total knee arthroplasty. After the injection she said she did quite well for many months. She states for the last 6 months however she has had progressive worsening left low back and again lateral hip pain and buttock pain. She denies any groin pain. She's had no trauma or bowel or bladder issues. She's had no focal weakness. No right-sided complaints. She does get pain across lower back worse with standing and ambulating. Her symptoms seem to decrease when she is sitting. She states that the knee pain is still problematic on the left. This is more of an anterior lateral knee pain. She denies any problem with weakness or locking of the knee. She reports that this can be very intermittent and some days she has no pain at all and to the knee and sometimes a great deal of pain. She does use some Tylenol anti-inflammatory at times. She reports that the prior injections seem to help her hip pain tremendously. She has used Voltaren gel in the past on her knee.    Review of Systems  Constitutional: Negative for chills, fever, malaise/fatigue and weight loss.  HENT: Negative for hearing loss and sinus pain.   Eyes: Negative for blurred vision, double vision and photophobia.  Respiratory:  Negative for cough and shortness of breath.   Cardiovascular: Negative for chest pain, palpitations and leg swelling.  Gastrointestinal: Negative for abdominal pain, nausea and vomiting.  Genitourinary: Negative for flank pain.  Musculoskeletal: Positive for back pain and joint pain. Negative for myalgias.  Skin: Negative for itching and rash.  Neurological: Negative for tremors, focal weakness and weakness.  Endo/Heme/Allergies: Negative.   Psychiatric/Behavioral: Negative for depression.  All other systems reviewed and are negative.  Otherwise per HPI.  Assessment & Plan: Visit Diagnoses:  1. Lumbar radiculopathy   2. Spondylolisthesis of lumbar region   3. Spondylosis without myelopathy or radiculopathy, lumbar region   4. Chronic pain of left knee     Plan: Findings:  Chronic worsening mainly low back and left hip pain which is posterior and posterior lateral. This very consistent with an L5 radicular-type pain. She doesn't really have pain shooting down to the knee but I think the knee pain may be related to her spine. She is somewhat of a poor historian and is hard to know if the injection at that time helped the knee but clearly she did not return to see me or Dr. Sharol Given for her knee for almost 11 months. I think at this point the best thing is to repeat the left L5 transforaminal epidural steroid injection diagnostically and hopefully therapeutically. MRI at that time did show a listhesis of L4 on L5 with facet arthropathy on the left at L5-S1. We  had stated before that the injection didn't help very much we would try facet joint block. Otherwise I did give her samples of Pennsaid 2 to try on the knee when it flares up. We'll see her back for the injection.    Meds & Orders: No orders of the defined types were placed in this encounter.  No orders of the defined types were placed in this encounter.   Follow-up: Return for left L5 transforaminal esi.   Procedures: No procedures  performed  No notes on file   Clinical History: MRI LUMBAR SPINE WITHOUT CONTRAST  TECHNIQUE: Multiplanar, multisequence MR imaging of the lumbar spine was performed. No intravenous contrast was administered.  COMPARISON:  Lumbar spine radiographs 01/02/2011  FINDINGS: Segmentation: 5 non rib-bearing lumbar type vertebral bodies are present.  Alignment: Grade 1 anterolisthesis at L4-5 measures 8 mm. There is slight retrolisthesis at L1-2 and L2-3. Levoconvex curvature of the lumbar spine is centered at L3.  Vertebrae: Mild endplate marrow changes are noted on the right at L2-3. Vertebral body heights and marrow signal are otherwise normal.  Conus medullaris: Extends to the T12 level and appears normal.  Paraspinal and other soft tissues: Limited imaging of the abdomen demonstrates mild left-sided renal atrophy. No discrete lesions are present. There is no significant adenopathy.  Disc levels:  L1-2: A far left lateral disc protrusion is present. This results in mild left foraminal stenosis.  L2-3: A broad-based disc protrusion is present. Mild facet hypertrophy is noted. Mild foraminal narrowing is worse on the right. Mild right subarticular narrowing is present as well.  L3-4: A rightward broad-based disc protrusion is present. Mild facet hypertrophy is noted bilaterally. Central canal is patent. Mild foraminal narrowing is worse on the right.  L4-5: There is uncovering of a broad-based disc protrusion. Advanced facet hypertrophy is noted bilaterally. Mild right subarticular narrowing is present. Mild right foraminal stenosis is present.  L5-S1: Asymmetric left-sided facet hypertrophy is present. No significant disc protrusion or stenosis is evident.  IMPRESSION: 1. Grade 1 anterolisthesis of 8 mm secondary to advanced facet hypertrophy at L4-5. 2. Far left lateral disc protrusion at L1-2 and mild left foraminal narrowing. 3. Mild right  subarticular and bilateral foraminal narrowing at L2-3, worse on the right. 4. Mild foraminal narrowing at L3-4 is worse on the right. 5. Mild right foraminal narrowing at L4-5. 6. Asymmetric left-sided facet hypertrophy at L5-S1 without significant focal stenosis.   Electronically Signed   By: San Morelle M.D.   On: 01/12/2016 18:59  She reports that she quit smoking about 23 years ago. Her smoking use included Cigarettes. She has a 25.00 pack-year smoking history. She has never used smokeless tobacco. No results for input(s): HGBA1C, LABURIC in the last 8760 hours.  Objective:  VS:  HT:    WT:   BMI:     BP:(!) 162/76  HR:74bpm  TEMP: ( )  RESP:  Physical Exam  Constitutional: She is oriented to person, place, and time. She appears well-developed and well-nourished.  Eyes: Pupils are equal, round, and reactive to light. Conjunctivae and EOM are normal.  Cardiovascular: Normal rate and intact distal pulses.   Pulmonary/Chest: Effort normal.  Musculoskeletal:  Patient is slow to rise from a seated position but does ambulate without aid with a fairly normal gait. She has pain with extension rotation of the lumbar spine bilaterally which is concordant with some of her low back pain. She has a negative slump test bilaterally. She has mild tenderness over  the greater trochanters bilaterally. She has no pain with hip rotation. She does have mild tenderness to the anterior lateral part of the knee. No pain over the pens anserine bursa. He does not show any effusion. She does have some lower extremity swelling with 1+ edema.  Neurological: She is alert and oriented to person, place, and time. She exhibits normal muscle tone.  Skin: Skin is warm and dry. No rash noted. No erythema.  Psychiatric: She has a normal mood and affect. Her behavior is normal.  Nursing note and vitals reviewed.   Ortho Exam Imaging: No results found.  Past Medical/Family/Surgical/Social  History: Medications & Allergies reviewed per EMR Patient Active Problem List   Diagnosis Date Noted  . Edema extremities 02/16/2014  . Sinusitis, chronic 11/11/2013  . Dyspnea 11/11/2013  . PVC's (premature ventricular contractions) 08/19/2013  . Benign hypertensive heart disease without heart failure 08/15/2013   Past Medical History:  Diagnosis Date  . Allergic rhinitis   . Chronic chest pain    and DOE w normal Stress cardiolyte  . Diverticulosis   . Heart murmur    Systolic heart murmur with Aortic valve sclerosis by ECHO  . Hyperlipidemia   . Hypertension   . Osteoarthritis    Erosive OA-MRI of R hand negative for synovitis, only OA-Dr Trudie Reed  . Osteopenia   . Pelvic prolapse   . PVC's (premature ventricular contractions)    Family History  Problem Relation Age of Onset  . Stroke Mother   . Diabetes Mother    Past Surgical History:  Procedure Laterality Date  . BUNIONECTOMY  08/2006   R foot and hammer roe right second toe  . CARPAL TUNNEL RELEASE  2002  . TOTAL KNEE ARTHROPLASTY Left 08/2007  . TOTAL KNEE ARTHROPLASTY Right 03/2010   Social History   Occupational History  . Not on file.   Social History Main Topics  . Smoking status: Former Smoker    Packs/day: 1.00    Years: 25.00    Types: Cigarettes    Quit date: 06/02/1993  . Smokeless tobacco: Never Used  . Alcohol use No  . Drug use: No  . Sexual activity: Not on file

## 2017-01-05 ENCOUNTER — Encounter (INDEPENDENT_AMBULATORY_CARE_PROVIDER_SITE_OTHER): Payer: Self-pay | Admitting: Physical Medicine and Rehabilitation

## 2017-01-05 ENCOUNTER — Ambulatory Visit (INDEPENDENT_AMBULATORY_CARE_PROVIDER_SITE_OTHER): Payer: PPO

## 2017-01-05 ENCOUNTER — Ambulatory Visit (INDEPENDENT_AMBULATORY_CARE_PROVIDER_SITE_OTHER): Payer: PPO | Admitting: Physical Medicine and Rehabilitation

## 2017-01-05 VITALS — BP 170/81 | HR 88

## 2017-01-05 DIAGNOSIS — M5416 Radiculopathy, lumbar region: Secondary | ICD-10-CM | POA: Diagnosis not present

## 2017-01-05 DIAGNOSIS — M4316 Spondylolisthesis, lumbar region: Secondary | ICD-10-CM | POA: Diagnosis not present

## 2017-01-05 MED ORDER — METHYLPREDNISOLONE ACETATE 80 MG/ML IJ SUSP
80.0000 mg | Freq: Once | INTRAMUSCULAR | Status: AC
  Administered 2017-01-05: 80 mg

## 2017-01-05 MED ORDER — LIDOCAINE HCL (PF) 1 % IJ SOLN: 2.0000 mL | Freq: Once | INTRAMUSCULAR | Status: DC

## 2017-01-05 NOTE — Progress Notes (Unsigned)
Fluoro Time: 40 sec Mgy: 35.67

## 2017-01-05 NOTE — Patient Instructions (Signed)

## 2017-01-05 NOTE — Progress Notes (Deleted)
Patient is here today for planned left L5  transforaminal injection. No change in symptoms.

## 2017-01-06 NOTE — Procedures (Signed)
[name] is a 81 year old female followed by Dr. Sharol Given with chronic worsening left hip and bilateral low back pain with some referral pattern in the leg and left lateral knee pain. She comes in today for planned L5 transforaminal injection to the left. She's had an injection like this last year that seemed to help but she's been somewhat of a diagnostic puzzle. We did have her try Pennsaid on her knee while she was awaiting approval for the injection and she reports that helped a little bit. She did report that she did put some of this on her back. We actually had a long discussion about not doing that but she said she did try anyway. She'll be confused today over the fact that we were planning to complete the injection and we did go over this again with her husband and her. Depending on the relief she gets with the injection. Follow-up with Dr. Sharol Given for evaluation again of her knee.  Lumbosacral Transforaminal Epidural Steroid Injection - Sub-Pedicular Approach with Fluoroscopic Guidance  Patient: Jacqueline Burke      Date of Birth: November 09, 1935 MRN: [id number] PCP: Darcus Austin, MD      Visit Date: 01/05/2017   Universal Protocol:    Date/Time: 01/05/2017  Consent Given By: the patient  Position: PRONE  Additional Comments: Vital signs were monitored before and after the procedure. Patient was prepped and draped in the usual sterile fashion. The correct patient, procedure, and site was verified.   Injection Procedure Details:  Procedure Site One Meds Administered:  Meds ordered this encounter  Medications  . lidocaine (PF) (XYLOCAINE) 1 % injection 2 mL  . methylPREDNISolone acetate (DEPO-MEDROL) injection 80 mg    Laterality: Left  Location/Site:  L5-S1  Needle size: 22 G  Needle type: Spinal  Needle Placement: Transforaminal  Findings:  -Contrast Used: 1 mL iohexol 180 mg iodine/mL   -Comments: Excellent flow of contrast along the nerve and into the epidural space. Flow  of contrast was fairly stenotic.  Procedure Details: After squaring off the end-plates to get a true AP view, the C-arm was positioned so that an oblique view of the foramen as noted above was visualized. The target area is just inferior to the "nose of the scotty dog" or sub pedicular. The soft tissues overlying this structure were infiltrated with 2-3 ml. of 1% Lidocaine without Epinephrine.  The spinal needle was inserted toward the target using a "trajectory" view along the fluoroscope beam.  Under AP and lateral visualization, the needle was advanced so it did not puncture dura and was located close the 6 O'Clock position of the pedical in AP tracterory. Biplanar projections were used to confirm position. Aspiration was confirmed to be negative for CSF and/or blood. A 1-2 ml. volume of Isovue-250 was injected and flow of contrast was noted at each level. Radiographs were obtained for documentation purposes.   After attaining the desired flow of contrast documented above, a 0.5 to 1.0 ml test dose of 0.25% Marcaine was injected into each respective transforaminal space.  The patient was observed for 90 seconds post injection.  After no sensory deficits were reported, and normal lower extremity motor function was noted,   the above injectate was administered so that equal amounts of the injectate were placed at each foramen (level) into the transforaminal epidural space.   Additional Comments:  The patient tolerated the procedure well Dressing: Band-Aid    Post-procedure details: Patient was observed during the procedure. Post-procedure instructions were reviewed.  Patient left the clinic in stable condition.

## 2017-01-21 DIAGNOSIS — N3946 Mixed incontinence: Secondary | ICD-10-CM | POA: Diagnosis not present

## 2017-01-21 DIAGNOSIS — R350 Frequency of micturition: Secondary | ICD-10-CM | POA: Diagnosis not present

## 2017-02-04 DIAGNOSIS — L821 Other seborrheic keratosis: Secondary | ICD-10-CM | POA: Diagnosis not present

## 2017-02-04 DIAGNOSIS — L570 Actinic keratosis: Secondary | ICD-10-CM | POA: Diagnosis not present

## 2017-02-13 ENCOUNTER — Other Ambulatory Visit: Payer: Self-pay | Admitting: Family Medicine

## 2017-02-13 DIAGNOSIS — Z1231 Encounter for screening mammogram for malignant neoplasm of breast: Secondary | ICD-10-CM

## 2017-02-19 ENCOUNTER — Encounter (INDEPENDENT_AMBULATORY_CARE_PROVIDER_SITE_OTHER): Payer: Self-pay

## 2017-02-19 ENCOUNTER — Ambulatory Visit (INDEPENDENT_AMBULATORY_CARE_PROVIDER_SITE_OTHER): Payer: PPO | Admitting: Cardiology

## 2017-02-19 VITALS — BP 132/72 | HR 68 | Ht 64.0 in | Wt 171.0 lb

## 2017-02-19 DIAGNOSIS — I119 Hypertensive heart disease without heart failure: Secondary | ICD-10-CM

## 2017-02-19 DIAGNOSIS — I493 Ventricular premature depolarization: Secondary | ICD-10-CM

## 2017-02-19 NOTE — Patient Instructions (Signed)
Medication Instructions:  Your physician recommends that you continue on your current medications as directed. Please refer to the Current Medication list given to you today.   Labwork: NONE ORDERED TODAY  Testing/Procedures: NONE ORDERED TODAY  Follow-Up: Your physician wants you to follow-up in: [place] will receive a reminder letter in the mail two months in advance. If you don't receive a letter, please call our office to schedule the follow-up appointment.   Any Other Special Instructions Will Be Listed Below (If Applicable).     If you need a refill on your cardiac medications before your next appointment, please call your pharmacy.

## 2017-02-19 NOTE — Progress Notes (Signed)
Cardiology Office Note:    Date:  02/19/2017   ID:  Jacqueline Burke, DOB 17-Jul-1935, MRN [id number]  PCP:  Darcus Austin, MD  Cardiologist:  Fransico Him, MD   Referring MD: Darcus Austin, MD   Chief Complaint  Patient presents with  . Hypertension    History of Present Illness:    Jacqueline Burke is a 81 y.o. female with a hx of HTN, PVC's and chronic noncardiac CP with normal stress test.  She is here today for followup and is doing well.  She denies any chest pain or pressure, SOB, DOE, PND, orthopnea,  dizziness, palpitations or syncope.  She occasionally will have some RLE edema but it is intermittent.     Past Medical History:  Diagnosis Date  . Allergic rhinitis   . Chronic chest pain    and DOE w normal Stress cardiolyte  . Diverticulosis   . Heart murmur    Systolic heart murmur with Aortic valve sclerosis by ECHO  . Hyperlipidemia   . Hypertension   . Osteoarthritis    Erosive OA-MRI of R hand negative for synovitis, only OA-Dr Trudie Reed  . Osteopenia   . Pelvic prolapse   . PVC's (premature ventricular contractions)     Past Surgical History:  Procedure Laterality Date  . BUNIONECTOMY  08/2006   R foot and hammer roe right second toe  . CARPAL TUNNEL RELEASE  2002  . TOTAL KNEE ARTHROPLASTY Left 08/2007  . TOTAL KNEE ARTHROPLASTY Right 03/2010    Current Medications: Current Meds  Medication Sig  . Acetaminophen (ACETAMIN PO) Take 200 mg by mouth as directed. 2-6 Tablets daily----Arthritis  . amitriptyline (ELAVIL) 10 MG tablet Take 20 mg by mouth at bedtime. Uses for Chronic Pain  . amLODipine (NORVASC) 5 MG tablet Take 1 tablet (5 mg total) by mouth daily.  . Ascorbic Acid (VITAMIN C PO) Take 1 tablet by mouth daily.  Marland Kitchen atorvastatin (LIPITOR) 10 MG tablet Take 10 mg by mouth daily.  . cholecalciferol (VITAMIN D) 1000 UNITS tablet Take 1,000 Units by mouth daily.  Marland Kitchen docusate sodium (COLACE) 100 MG capsule Take 300 mg by mouth daily.  Marland Kitchen FIBER PO Take 2 tablets  by mouth daily.  . fluticasone (FLONASE) 50 MCG/ACT nasal spray Place 2 sprays into both nostrils daily.  Marland Kitchen gabapentin (NEURONTIN) 100 MG capsule Take 100 mg by mouth at bedtime.   Marland Kitchen MYRBETRIQ 50 MG TB24 tablet Take 50 mg by mouth daily.    Current Facility-Administered Medications for the 02/19/17 encounter (Office Visit) with Sueanne Margarita, MD  Medication  . lidocaine (PF) (XYLOCAINE) 1 % injection 2 mL     Allergies:   Ciprofloxacin; Codeine; Demerol [meperidine]; Other; Ciprofloxacin hcl; Darvon [propoxyphene]; Hydrogen peroxide; and Tramadol   Social History   Social History  . Marital status: Married    Spouse name: N/A  . Number of children: N/A  . Years of education: N/A   Social History Main Topics  . Smoking status: Former Smoker    Packs/day: 1.00    Years: 25.00    Types: Cigarettes    Quit date: 06/02/1993  . Smokeless tobacco: Never Used  . Alcohol use No  . Drug use: No  . Sexual activity: Not on file   Other Topics Concern  . Not on file   Social History Narrative  . No narrative on file     Family History: The patient's family history includes Diabetes in her mother; Stroke in her  mother.  ROS:   Please see the history of present illness.    ROS  All other systems reviewed and negative.   EKGs/Labs/Other Studies Reviewed:    The following studies were reviewed today: none  EKG:  EKG is  ordered today.  The ekg ordered today demonstrates NSR at 68bpm with no ST changes  Recent Labs: No results found for requested labs within last 8760 hours.   Recent Lipid Panel No results found for: CHOL, TRIG, HDL, CHOLHDL, VLDL, LDLCALC, LDLDIRECT  Physical Exam:    VS:  BP 132/72   Pulse 68   Ht 5\' 4"  (1.626 m)   Wt 171 lb (77.6 kg)   BMI 29.35 kg/m     Wt Readings from Last 3 Encounters:  02/19/17 171 lb (77.6 kg)  02/20/16 165 lb 12.8 oz (75.2 kg)  02/19/15 162 lb 6.4 oz (73.7 kg)     GEN:  Well nourished, well developed in no acute  distress HEENT: Normal NECK: No JVD; No carotid bruits LYMPHATICS: No lymphadenopathy CARDIAC: RRR, no murmurs, rubs, gallops RESPIRATORY:  Clear to auscultation without rales, wheezing or rhonchi  ABDOMEN: Soft, non-tender, non-distended MUSCULOSKELETAL:  No edema; No deformity  SKIN: Warm and dry NEUROLOGIC:  Alert and oriented x 3 PSYCHIATRIC:  Normal affect   ASSESSMENT:    1. Benign hypertensive heart disease without heart failure   2. PVC's (premature ventricular contractions)    PLAN:    In order of problems listed above:  HTN - BP well controlled on exam today. She will continue on amlodipine 5mg  daily.  PVC's - these are asymptomatic.    Medication Adjustments/Labs and Tests Ordered: Current medicines are reviewed at length with the patient today.  Concerns regarding medicines are outlined above.  No orders of the defined types were placed in this encounter.  No orders of the defined types were placed in this encounter.   Signed, Fransico Him, MD  02/19/2017 9:31 AM    [facility]

## 2017-03-16 DIAGNOSIS — R350 Frequency of micturition: Secondary | ICD-10-CM | POA: Diagnosis not present

## 2017-03-16 DIAGNOSIS — N3946 Mixed incontinence: Secondary | ICD-10-CM | POA: Diagnosis not present

## 2017-03-26 DIAGNOSIS — Z23 Encounter for immunization: Secondary | ICD-10-CM | POA: Diagnosis not present

## 2017-03-30 ENCOUNTER — Ambulatory Visit
Admission: RE | Admit: 2017-03-30 | Discharge: 2017-03-30 | Disposition: A | Discharge status: Home / Self Care | Payer: PPO | Source: Ambulatory Visit | Attending: Family Medicine | Admitting: Family Medicine

## 2017-03-30 DIAGNOSIS — Z1231 Encounter for screening mammogram for malignant neoplasm of breast: Secondary | ICD-10-CM

## 2017-04-20 ENCOUNTER — Encounter (INDEPENDENT_AMBULATORY_CARE_PROVIDER_SITE_OTHER): Payer: Self-pay | Admitting: Orthopedic Surgery

## 2017-04-20 ENCOUNTER — Ambulatory Visit (INDEPENDENT_AMBULATORY_CARE_PROVIDER_SITE_OTHER): Payer: PPO | Admitting: Orthopedic Surgery

## 2017-04-20 DIAGNOSIS — M5416 Radiculopathy, lumbar region: Secondary | ICD-10-CM

## 2017-04-20 NOTE — Progress Notes (Signed)
Office Visit Note   Patient: Jacqueline Burke           Date of Birth: [birth year]           MRN: [id number] Visit Date: 04/20/2017              Requested by: Darcus Austin, MD [place], [place] [address] PCP: Darcus Austin, MD  Chief Complaint  Patient presents with  . Left Leg - Pain      HPI: Patient is a 81 year old woman who is having persistent lumbar spine radicular symptoms which radiates from her left buttock lateral aspect of the left knee which she states is numb to the lateral aspect of her ankle.  She states it does not go distal to her ankle.  She has had 2 injections with Dr. Ernestina Patches in the last injection only provided very short temporary relief.  Assessment & Plan: Visit Diagnoses:  1. Lumbar radiculopathy     Plan: We will have patient follow-up with Dr. Inda Merlin for evaluation for lumbar decompression.  Follow-Up Instructions: Return if symptoms worsen or fail to improve.   Ortho Exam  Patient is alert, oriented, no adenopathy, well-dressed, normal affect, normal respiratory effort. Examination patient does have an antalgic gait.  She has a positive straight leg raise in the left she has no focal motor weakness in the left lower extremity with hip flexion knee flexion extension lateral flexion and dorsiflexion of the ankle intact.  Patient states that flexion or sitting does not improve her symptoms.  Imaging: No results found. No images are attached to the encounter.  Labs: Lab Results  Component Value Date   REPTSTATUS 04/12/2010 FINAL 04/06/2010   CULT NO GROWTH 5 DAYS 04/06/2010    Orders:  No orders of the defined types were placed in this encounter.  No orders of the defined types were placed in this encounter.    Procedures: No procedures performed  Clinical Data: No additional findings.  ROS:  All other systems negative, except as noted in the HPI. Review of Systems  Objective: Vital Signs: There were no  vitals taken for this visit.  Specialty Comments:  No specialty comments available.  PMFS History: Patient Active Problem List   Diagnosis Date Noted  . Edema extremities 02/16/2014  . Sinusitis, chronic 11/11/2013  . Dyspnea 11/11/2013  . PVC's (premature ventricular contractions) 08/19/2013  . Benign hypertensive heart disease without heart failure 08/15/2013   Past Medical History:  Diagnosis Date  . Allergic rhinitis   . Chronic chest pain    and DOE w normal Stress cardiolyte  . Diverticulosis   . Heart murmur    Systolic heart murmur with Aortic valve sclerosis by ECHO  . Hyperlipidemia   . Hypertension   . Osteoarthritis    Erosive OA-MRI of R hand negative for synovitis, only OA-Dr Trudie Reed  . Osteopenia   . Pelvic prolapse   . PVC's (premature ventricular contractions)     Family History  Problem Relation Age of Onset  . Stroke Mother   . Diabetes Mother   . Breast cancer Cousin     Past Surgical History:  Procedure Laterality Date  . BUNIONECTOMY  08/2006   R foot and hammer roe right second toe  . CARPAL TUNNEL RELEASE  2002  . TOTAL KNEE ARTHROPLASTY Left 08/2007  . TOTAL KNEE ARTHROPLASTY Right 03/2010   Social History   Occupational History  . Not on file  Tobacco Use  .  Smoking status: Former Smoker    Packs/day: 1.00    Years: 25.00    Pack years: 25.00    Types: Cigarettes    Last attempt to quit: 06/02/1993    Years since quitting: 23.8  . Smokeless tobacco: Never Used  Substance and Sexual Activity  . Alcohol use: No  . Drug use: No  . Sexual activity: Not on file

## 2017-04-22 ENCOUNTER — Ambulatory Visit (INDEPENDENT_AMBULATORY_CARE_PROVIDER_SITE_OTHER): Payer: PPO | Admitting: Orthopaedic Surgery

## 2017-04-22 ENCOUNTER — Encounter (INDEPENDENT_AMBULATORY_CARE_PROVIDER_SITE_OTHER): Payer: Self-pay | Admitting: Orthopaedic Surgery

## 2017-04-22 ENCOUNTER — Ambulatory Visit (INDEPENDENT_AMBULATORY_CARE_PROVIDER_SITE_OTHER): Payer: PPO

## 2017-04-22 VITALS — BP 184/82 | HR 64 | Wt 160.0 lb

## 2017-04-22 DIAGNOSIS — M545 Low back pain: Secondary | ICD-10-CM

## 2017-04-22 DIAGNOSIS — M4807 Spinal stenosis, lumbosacral region: Secondary | ICD-10-CM

## 2017-04-22 DIAGNOSIS — G8929 Other chronic pain: Secondary | ICD-10-CM

## 2017-04-24 DIAGNOSIS — R05 Cough: Secondary | ICD-10-CM | POA: Diagnosis not present

## 2017-04-25 ENCOUNTER — Encounter (INDEPENDENT_AMBULATORY_CARE_PROVIDER_SITE_OTHER): Payer: Self-pay | Admitting: Orthopaedic Surgery

## 2017-04-25 NOTE — Progress Notes (Signed)
Office Visit Note   Patient: Jacqueline Burke           Date of Birth: 11/29/1935           MRN: [id number] Visit Date: 04/22/2017              Requested by: Darcus Austin, MD [place], [place] [address] PCP: Darcus Austin, MD   Assessment & Plan: Visit Diagnoses:  1. Chronic bilateral low back pain, with sciatica presence unspecified   2. Spinal stenosis of lumbosacral region     Plan: We will proceed with a new MRI scan since she is failed conservative treatment including epidural steroids exercise program and medication.  MRI to rule out progression of lateral recess stenosis due to degenerative anterolisthesis at L4-5.  Office follow-up after scan.  Follow-Up Instructions: No Follow-up on file.   Orders:  Orders Placed This Encounter  Procedures  . XR Lumbar Spine 2-3 Views  . MR Lumbar Spine w/o contrast   No orders of the defined types were placed in this encounter.     Procedures: No procedures performed   Clinical Data: No additional findings.   Subjective: Chief Complaint  Patient presents with  . Lower Back - Pain    HPI patient referred to me by Dr. due to for evaluation of potential lumbar surgery.  Previous injections x2 with Dr. Ernestina Patches lumbar L5 distribution on the left that gave her some temporary relief.  Back pain left buttocks pain that radiates down the lateral aspect of her knee and sometimes to the lateral ankle.  Injections were in July and August 2018.  Gotten worse in the last 3 weeks pain with turning and twisting sometimes she feels a sharp extreme pain that radiates down the left leg.  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  Tylenol without significant relief.  Previous MRI scan 2017 showed 8 mm of anterolisthesis with facet arthropathy.  Left facet hypertrophy at L5-S1 mild subarticular narrowing noted L4-5 bilaterally.  Far lateral protrusion at L1 to the left.  No fever chills no bowel  or bladder associated symptoms.  Review of Systems 14 point review of systems positive for history of PVCs, mild lower extremity edema hypertensive heart disease without heart failure stasis.  L4-5 anterolisthesis, degenerative.  Upper lipidemia, diverticulosis, allergic rhinitis.  Otherwise negative as pertains to HPI.   Objective: Vital Signs: BP (!) 184/82   Pulse 64   Wt 160 lb (72.6 kg)   BMI 27.46 kg/m   Physical Exam  Constitutional: She is oriented to person, place, and time. She appears well-developed.  HENT:  Head: Normocephalic.  Right Ear: External ear normal.  Left Ear: External ear normal.  Eyes: Pupils are equal, round, and reactive to light.  Neck: No tracheal deviation present. No thyromegaly present.  Cardiovascular: Normal rate.  Systolic ejection murmur.  Pulmonary/Chest: Effort normal.  Abdominal: Soft.  Neurological: She is alert and oriented to person, place, and time.  Skin: Skin is warm and dry.  Psychiatric: She has a normal mood and affect. Her behavior is normal.    Ortho Exam she has level pelvis slight lumbar curvature.  Sciatic notch tenderness some pain with straight leg raising 90 degrees.  Anterior tib EHL is intact.  Pulses are palpable she has trace lower extremity edema.  She has palpable pulses dorsalis pedis posterior tibial.  Specialty Comments:  No specialty comments available.  Imaging: No results  found.   PMFS History: Patient Active Problem List   Diagnosis Date Noted  . Edema extremities 02/16/2014  . Sinusitis, chronic 11/11/2013  . Dyspnea 11/11/2013  . PVC's (premature ventricular contractions) 08/19/2013  . Benign hypertensive heart disease without heart failure 08/15/2013   Past Medical History:  Diagnosis Date  . Allergic rhinitis   . Chronic chest pain    and DOE w normal Stress cardiolyte  . Diverticulosis   . Heart murmur    Systolic heart murmur with Aortic valve sclerosis by ECHO  . Hyperlipidemia   .  Hypertension   . Osteoarthritis    Erosive OA-MRI of R hand negative for synovitis, only OA-Dr Trudie Reed  . Osteopenia   . Pelvic prolapse   . PVC's (premature ventricular contractions)     Family History  Problem Relation Age of Onset  . Stroke Mother   . Diabetes Mother   . Breast cancer Cousin     Past Surgical History:  Procedure Laterality Date  . BUNIONECTOMY  08/2006   R foot and hammer roe right second toe  . CARPAL TUNNEL RELEASE  2002  . TOTAL KNEE ARTHROPLASTY Left 08/2007  . TOTAL KNEE ARTHROPLASTY Right 03/2010   Social History   Occupational History  . Not on file  Tobacco Use  . Smoking status: Former Smoker    Packs/day: 1.00    Years: 25.00    Pack years: 25.00    Types: Cigarettes    Last attempt to quit: 06/02/1993    Years since quitting: 23.9  . Smokeless tobacco: Never Used  Substance and Sexual Activity  . Alcohol use: No  . Drug use: No  . Sexual activity: Not on file

## 2017-05-10 ENCOUNTER — Inpatient Hospital Stay: Admission: RE | Admit: 2017-05-10 | Payer: PPO | Source: Ambulatory Visit

## 2017-05-16 ENCOUNTER — Ambulatory Visit
Admission: RE | Admit: 2017-05-16 | Discharge: 2017-05-16 | Disposition: A | Discharge status: Home / Self Care | Payer: PPO | Source: Ambulatory Visit | Attending: Orthopaedic Surgery | Admitting: Orthopaedic Surgery

## 2017-05-16 DIAGNOSIS — M4807 Spinal stenosis, lumbosacral region: Secondary | ICD-10-CM

## 2017-05-16 DIAGNOSIS — M48061 Spinal stenosis, lumbar region without neurogenic claudication: Secondary | ICD-10-CM | POA: Diagnosis not present

## 2017-05-22 ENCOUNTER — Encounter (INDEPENDENT_AMBULATORY_CARE_PROVIDER_SITE_OTHER): Payer: Self-pay | Admitting: Orthopaedic Surgery

## 2017-05-22 ENCOUNTER — Ambulatory Visit (INDEPENDENT_AMBULATORY_CARE_PROVIDER_SITE_OTHER): Payer: PPO | Admitting: Orthopaedic Surgery

## 2017-05-22 VITALS — BP 152/82 | HR 78 | Ht 64.0 in | Wt 160.0 lb

## 2017-05-22 DIAGNOSIS — M48061 Spinal stenosis, lumbar region without neurogenic claudication: Secondary | ICD-10-CM | POA: Diagnosis not present

## 2017-05-22 NOTE — Progress Notes (Signed)
Office Visit Note   Patient: Jacqueline Burke           Date of Birth: 07-Aug-1935           MRN: [id number] Visit Date: 05/22/2017              Requested by: Darcus Austin, MD [address], [place] [address] PCP: Darcus Austin, MD   Assessment & Plan: Visit Diagnoses: No diagnosis found.  Plan: Patient is gotten recent improvement.  She has moderate stenosis at L3-4 level with foraminal encroachment worse on the right than left.  Degenerative anterolisthesis at L4-5 with moderate bilateral recess stenosis.  She can stand for an extended period of time.  A grocery cart when she goes to the store.  Her normal daily activities have not significantly changed since she is gotten improvement in her pain in the last week.  I gave her a copy of the report.  Discussed neurogenic claudication symptoms and indications for possible surgical intervention but at this point he does not have significant enough symptoms to consider further intervention.  Work on a walking program sit and rest she has increased symptoms.  If she has increased symptoms she can return.  Follow-Up Instructions: Return if symptoms worsen or fail to improve.   Orders:  No orders of the defined types were placed in this encounter.  No orders of the defined types were placed in this encounter.     Procedures: No procedures performed   Clinical Data: No additional findings.   Subjective: Chief Complaint  Patient presents with  . Lower Back - Pain, Follow-up    MRI review    HPI 81 year old female returns for ongoing problems with back pain.  He has been treated with anti-inflammatory medicine past history of epidural steroid injections.  She has had total knee arthroplasty states she recently stopped using a pillow between her knees when she sleeps thinks that now her back is doing much better related to the pillow in some way.  States she has some more discomfort when she walks on concrete but  does not have neurogenic claudication symptoms.  She has back pain left buttocks pain that radiates down to her knee sometimes the lateral ankle relates is mild.  New MRI was obtained due to her ongoing symptoms and failure to respond to injections as well as conservative treatment.  Review of Systems is updated unchanged from 04/22/2017 office visit other than as mentioned above in HPI.   Objective: Vital Signs: BP (!) 152/82   Pulse 78   Ht 5\' 4"  (1.626 m)   Wt 160 lb (72.6 kg)   BMI 27.46 kg/m   Physical Exam  Constitutional: She is oriented to person, place, and time. She appears well-developed.  HENT:  Head: Normocephalic.  Right Ear: External ear normal.  Left Ear: External ear normal.  Eyes: Pupils are equal, round, and reactive to light.  Neck: No tracheal deviation present. No thyromegaly present.  Cardiovascular: Normal rate.  Pulmonary/Chest: Effort normal.  Abdominal: Soft.  Neurological: She is alert and oriented to person, place, and time.  Skin: Skin is warm and dry.  Psychiatric: She has a normal mood and affect. Her behavior is normal.  Systolic ejection murmur.  Ortho Exam patient has slight lumbar curvature.  Some tenderness palpation of the lumbar spine.  Left sciatic notch tenderness mild trochanteric bursal tenderness negative logroll.  Well-healed knee incisions from total knee arthroplasties.  Chelle is intact.  Distal pulses are palpable.  Specialty Comments:  No specialty comments available.  Imaging: CLINICAL DATA:  Back and bilateral leg pain.  EXAM: MRI LUMBAR SPINE WITHOUT CONTRAST  TECHNIQUE: Multiplanar, multisequence MR imaging of the lumbar spine was performed. No intravenous contrast was administered.  COMPARISON:  01/12/2016  FINDINGS: Segmentation: 5 lumbar type vertebral bodies. The last full intervertebral disc space is labeled L5-S1. This correlates with the prior MRI  Alignment: Degenerative lumbar spondylosis with  multilevel disc disease and facet disease. Non isthmic spondylolisthesis at L4 appears stable. There is also a unilateral pars defect on the left at L5. Mild anterolisthesis of L3 also.  Vertebrae: No bone lesions or fracture. Schmorl's nodes are stable.  Conus medullaris and cauda equina: Conus extends to the T12-L1 level. Conus and cauda equina appear normal.  Paraspinal and other soft tissues: No significant findings.  Disc levels:  T12-L1 stable bulging annulus and osteophytic ridging with mild flattening of the ventral thecal sac but no significant spinal or foraminal stenosis.  L1-2: Advanced degenerate disc disease appears stable. Bulging degenerated annulus without significant spinal stenosis. Mild bilateral lateral recess stenosis appears stable. Extraforaminal encroachment on both L1 nerve roots appear stable.  L2-3: Bulging degenerated annulus and advanced facet disease with mild spinal and mild to moderate bilateral lateral recess stenosis. Findings are progressive when compared to the prior examination. There is also extraforaminal encroachment on both L2 nerve roots which also appears slightly progressive.  L3-4: Broad-based bulging annulus, short pedicles and facet disease contributing to mild to moderate spinal and bilateral lateral recess stenosis. Findings are somewhat progressive when compared the prior examination. No significant foraminal stenosis. Mild foraminal encroachment on the right side.  L4-5: Degenerative anterolisthesis of L4 with a bulging uncovered disc. There is also severe facet disease and the combination contributes to mild spinal and moderate bilateral lateral recess stenosis. This appears relatively stable. There is also mild foraminal encroachment bilaterally, right greater than left.  L5-S1: Facet disease but no disc protrusions, spinal or foraminal stenosis.  IMPRESSION: 1. Degenerative lumbar spondylosis with  multilevel disc disease and facet disease. 2. Stable changes at T12-L1 and L1-2, without significant findings. 3. Progressive findings at L2-3 with mild spinal and mild to moderate bilateral lateral recess stenosis along with extraforaminal encroachment involving both L2 nerve roots. 4. Slightly progressive mild to moderate spinal and bilateral lateral recess stenosis at L3-4. There is also mild foraminal encroachment on the right. 5. Stable degenerative anterolisthesis of L4 with stable mild spinal and moderate bilateral lateral recess stenosis and mild right greater than left foraminal stenosis.   Electronically Signed   By: Marijo Sanes M.D.   On: 05/16/2017 12:32    PMFS History: Patient Active Problem List   Diagnosis Date Noted  . Edema extremities 02/16/2014  . Sinusitis, chronic 11/11/2013  . Dyspnea 11/11/2013  . PVC's (premature ventricular contractions) 08/19/2013  . Benign hypertensive heart disease without heart failure 08/15/2013   Past Medical History:  Diagnosis Date  . Allergic rhinitis   . Chronic chest pain    and DOE w normal Stress cardiolyte  . Diverticulosis   . Heart murmur    Systolic heart murmur with Aortic valve sclerosis by ECHO  . Hyperlipidemia   . Hypertension   . Osteoarthritis    Erosive OA-MRI of R hand negative for synovitis, only OA-Dr Trudie Reed  . Osteopenia   . Pelvic prolapse   . PVC's (premature ventricular contractions)     Family History  Problem Relation  Age of Onset  . Stroke Mother   . Diabetes Mother   . Breast cancer Cousin     Past Surgical History:  Procedure Laterality Date  . BUNIONECTOMY  08/2006   R foot and hammer roe right second toe  . CARPAL TUNNEL RELEASE  2002  . TOTAL KNEE ARTHROPLASTY Left 08/2007  . TOTAL KNEE ARTHROPLASTY Right 03/2010   Social History   Occupational History  . Not on file  Tobacco Use  . Smoking status: Former Smoker    Packs/day: 1.00    Years: 25.00    Pack years:  25.00    Types: Cigarettes    Last attempt to quit: 06/02/1993    Years since quitting: 23.9  . Smokeless tobacco: Never Used  Substance and Sexual Activity  . Alcohol use: No  . Drug use: No  . Sexual activity: Not on file

## 2017-05-24 ENCOUNTER — Encounter (INDEPENDENT_AMBULATORY_CARE_PROVIDER_SITE_OTHER): Payer: Self-pay | Admitting: Orthopaedic Surgery

## 2017-06-07 ENCOUNTER — Encounter (HOSPITAL_COMMUNITY): Payer: Self-pay

## 2017-06-07 ENCOUNTER — Emergency Department (HOSPITAL_COMMUNITY): Payer: PPO

## 2017-06-07 ENCOUNTER — Other Ambulatory Visit: Payer: Self-pay

## 2017-06-07 ENCOUNTER — Emergency Department (HOSPITAL_COMMUNITY)
Admission: EM | Admit: 2017-06-07 | Discharge: 2017-06-07 | Disposition: A | Discharge status: Home / Self Care | Payer: PPO | Source: Home / Self Care | Attending: Emergency Medicine | Admitting: Emergency Medicine

## 2017-06-07 DIAGNOSIS — Z823 Family history of stroke: Secondary | ICD-10-CM | POA: Diagnosis not present

## 2017-06-07 DIAGNOSIS — R7881 Bacteremia: Secondary | ICD-10-CM | POA: Diagnosis not present

## 2017-06-07 DIAGNOSIS — E7800 Pure hypercholesterolemia, unspecified: Secondary | ICD-10-CM | POA: Diagnosis present

## 2017-06-07 DIAGNOSIS — Z79899 Other long term (current) drug therapy: Secondary | ICD-10-CM | POA: Diagnosis not present

## 2017-06-07 DIAGNOSIS — B9620 Unspecified Escherichia coli [E. coli] as the cause of diseases classified elsewhere: Secondary | ICD-10-CM | POA: Diagnosis present

## 2017-06-07 DIAGNOSIS — I214 Non-ST elevation (NSTEMI) myocardial infarction: Secondary | ICD-10-CM | POA: Diagnosis present

## 2017-06-07 DIAGNOSIS — R0600 Dyspnea, unspecified: Secondary | ICD-10-CM | POA: Diagnosis not present

## 2017-06-07 DIAGNOSIS — I10 Essential (primary) hypertension: Secondary | ICD-10-CM

## 2017-06-07 DIAGNOSIS — I472 Ventricular tachycardia: Secondary | ICD-10-CM | POA: Diagnosis not present

## 2017-06-07 DIAGNOSIS — M1990 Unspecified osteoarthritis, unspecified site: Secondary | ICD-10-CM | POA: Diagnosis present

## 2017-06-07 DIAGNOSIS — N12 Tubulo-interstitial nephritis, not specified as acute or chronic: Secondary | ICD-10-CM | POA: Diagnosis not present

## 2017-06-07 DIAGNOSIS — R32 Unspecified urinary incontinence: Secondary | ICD-10-CM | POA: Diagnosis present

## 2017-06-07 DIAGNOSIS — N201 Calculus of ureter: Secondary | ICD-10-CM | POA: Diagnosis not present

## 2017-06-07 DIAGNOSIS — J9690 Respiratory failure, unspecified, unspecified whether with hypoxia or hypercapnia: Secondary | ICD-10-CM | POA: Diagnosis not present

## 2017-06-07 DIAGNOSIS — I9788 Other intraoperative complications of the circulatory system, not elsewhere classified: Secondary | ICD-10-CM | POA: Diagnosis not present

## 2017-06-07 DIAGNOSIS — Z4682 Encounter for fitting and adjustment of non-vascular catheter: Secondary | ICD-10-CM | POA: Diagnosis not present

## 2017-06-07 DIAGNOSIS — N200 Calculus of kidney: Secondary | ICD-10-CM | POA: Insufficient documentation

## 2017-06-07 DIAGNOSIS — R109 Unspecified abdominal pain: Secondary | ICD-10-CM | POA: Diagnosis not present

## 2017-06-07 DIAGNOSIS — N136 Pyonephrosis: Secondary | ICD-10-CM | POA: Diagnosis present

## 2017-06-07 DIAGNOSIS — Z955 Presence of coronary angioplasty implant and graft: Secondary | ICD-10-CM | POA: Diagnosis not present

## 2017-06-07 DIAGNOSIS — J9601 Acute respiratory failure with hypoxia: Secondary | ICD-10-CM | POA: Diagnosis not present

## 2017-06-07 DIAGNOSIS — N390 Urinary tract infection, site not specified: Secondary | ICD-10-CM | POA: Diagnosis not present

## 2017-06-07 DIAGNOSIS — Z885 Allergy status to narcotic agent status: Secondary | ICD-10-CM | POA: Diagnosis not present

## 2017-06-07 DIAGNOSIS — Z8249 Family history of ischemic heart disease and other diseases of the circulatory system: Secondary | ICD-10-CM | POA: Diagnosis not present

## 2017-06-07 DIAGNOSIS — N179 Acute kidney failure, unspecified: Secondary | ICD-10-CM | POA: Diagnosis present

## 2017-06-07 DIAGNOSIS — M549 Dorsalgia, unspecified: Secondary | ICD-10-CM | POA: Diagnosis not present

## 2017-06-07 DIAGNOSIS — Z87891 Personal history of nicotine dependence: Secondary | ICD-10-CM | POA: Diagnosis not present

## 2017-06-07 DIAGNOSIS — I493 Ventricular premature depolarization: Secondary | ICD-10-CM | POA: Diagnosis present

## 2017-06-07 DIAGNOSIS — N132 Hydronephrosis with renal and ureteral calculous obstruction: Secondary | ICD-10-CM | POA: Diagnosis not present

## 2017-06-07 DIAGNOSIS — R079 Chest pain, unspecified: Secondary | ICD-10-CM | POA: Diagnosis not present

## 2017-06-07 DIAGNOSIS — G8929 Other chronic pain: Secondary | ICD-10-CM | POA: Diagnosis present

## 2017-06-07 DIAGNOSIS — D649 Anemia, unspecified: Secondary | ICD-10-CM | POA: Diagnosis present

## 2017-06-07 DIAGNOSIS — R6520 Severe sepsis without septic shock: Secondary | ICD-10-CM | POA: Diagnosis present

## 2017-06-07 DIAGNOSIS — A4151 Sepsis due to Escherichia coli [E. coli]: Secondary | ICD-10-CM | POA: Diagnosis not present

## 2017-06-07 DIAGNOSIS — Z96653 Presence of artificial knee joint, bilateral: Secondary | ICD-10-CM | POA: Diagnosis present

## 2017-06-07 DIAGNOSIS — R0603 Acute respiratory distress: Secondary | ICD-10-CM | POA: Diagnosis not present

## 2017-06-07 DIAGNOSIS — J95821 Acute postprocedural respiratory failure: Secondary | ICD-10-CM | POA: Diagnosis not present

## 2017-06-07 DIAGNOSIS — Z881 Allergy status to other antibiotic agents status: Secondary | ICD-10-CM | POA: Diagnosis not present

## 2017-06-07 DIAGNOSIS — E876 Hypokalemia: Secondary | ICD-10-CM | POA: Diagnosis present

## 2017-06-07 DIAGNOSIS — A419 Sepsis, unspecified organism: Secondary | ICD-10-CM | POA: Diagnosis present

## 2017-06-07 LAB — URINALYSIS, ROUTINE W REFLEX MICROSCOPIC
Bilirubin Urine: NEGATIVE
GLUCOSE, UA: NEGATIVE mg/dL
KETONES UR: 15 mg/dL — AB
Nitrite: NEGATIVE
PH: 5 (ref 5.0–8.0)
PROTEIN: 30 mg/dL — AB
Specific Gravity, Urine: >1.03 — ABNORMAL HIGH (ref 1.005–1.030)

## 2017-06-07 LAB — URINALYSIS, MICROSCOPIC (REFLEX)

## 2017-06-07 LAB — BASIC METABOLIC PANEL
Anion gap: 11 (ref 5–15)
BUN: 18 mg/dL (ref 6–20)
CALCIUM: 9.8 mg/dL (ref 8.9–10.3)
CO2: 24 mmol/L (ref 22–32)
CREATININE: 1.09 mg/dL — AB (ref 0.44–1.00)
Chloride: 103 mmol/L (ref 101–111)
GFR calc Af Amer: 54 mL/min — ABNORMAL LOW (ref >60)
GFR calc non Af Amer: 47 mL/min — ABNORMAL LOW (ref >60)
Glucose, Bld: 137 mg/dL — ABNORMAL HIGH (ref 65–99)
Potassium: 4.6 mmol/L (ref 3.5–5.1)
SODIUM: 138 mmol/L (ref 135–145)

## 2017-06-07 LAB — CBC
HCT: 40.4 % (ref 36.0–46.0)
Hemoglobin: 13 g/dL (ref 12.0–15.0)
MCH: 30.1 pg (ref 26.0–34.0)
MCHC: 32.2 g/dL (ref 30.0–36.0)
MCV: 93.5 fL (ref 78.0–100.0)
Platelets: 296 10*3/uL (ref 150–400)
RBC: 4.32 MIL/uL (ref 3.87–5.11)
RDW: 13.5 % (ref 11.5–15.5)
WBC: 16.5 10*3/uL — ABNORMAL HIGH (ref 4.0–10.5)

## 2017-06-07 MED ORDER — CEPHALEXIN 500 MG PO CAPS: 500.0000 mg | ORAL_CAPSULE | Freq: Four times a day (QID) | ORAL | 0 refills | Status: DC

## 2017-06-07 MED ORDER — SODIUM CHLORIDE 0.9 % IV BOLUS (SEPSIS)
500.0000 mL | Freq: Once | INTRAVENOUS | Status: AC
  Administered 2017-06-07: 500 mL via INTRAVENOUS

## 2017-06-07 MED ORDER — FENTANYL CITRATE (PF) 100 MCG/2ML IJ SOLN
50.0000 ug | Freq: Once | INTRAMUSCULAR | Status: AC
  Administered 2017-06-07: 50 ug via INTRAVENOUS
  Filled 2017-06-07: qty 2

## 2017-06-07 MED ORDER — ONDANSETRON HCL 4 MG/2ML IJ SOLN
4.0000 mg | Freq: Once | INTRAMUSCULAR | Status: AC
  Administered 2017-06-07: 4 mg via INTRAVENOUS
  Filled 2017-06-07: qty 2

## 2017-06-07 MED ORDER — KETOROLAC TROMETHAMINE 30 MG/ML IJ SOLN
30.0000 mg | Freq: Once | INTRAMUSCULAR | Status: AC
  Administered 2017-06-07: 30 mg via INTRAVENOUS
  Filled 2017-06-07: qty 1

## 2017-06-07 MED ORDER — CEPHALEXIN 250 MG PO CAPS
500.0000 mg | ORAL_CAPSULE | Freq: Once | ORAL | Status: AC
  Administered 2017-06-07: 500 mg via ORAL
  Filled 2017-06-07: qty 2

## 2017-06-07 MED ORDER — MORPHINE SULFATE (PF) 4 MG/ML IV SOLN
4.0000 mg | Freq: Once | INTRAVENOUS | Status: AC
  Administered 2017-06-07: 4 mg via INTRAMUSCULAR
  Filled 2017-06-07: qty 1

## 2017-06-07 MED ORDER — OXYCODONE-ACETAMINOPHEN 5-325 MG PO TABS: 1.0000 | ORAL_TABLET | ORAL | 0 refills | Status: DC | PRN

## 2017-06-07 MED ORDER — ONDANSETRON 4 MG PO TBDP: 4.0000 mg | ORAL_TABLET | Freq: Three times a day (TID) | ORAL | 0 refills | Status: DC | PRN

## 2017-06-07 NOTE — ED Notes (Signed)
Patient verbalizes understanding of discharge instructions. Opportunity for questioning and answers were provided. Armband removed by staff, pt discharged from ED ambualtory.  

## 2017-06-07 NOTE — ED Triage Notes (Signed)
Patient complains of severe left side and flank pain x 3 hours. Patient reports nausea with same. Restless and bladder pressure with same

## 2017-06-07 NOTE — ED Provider Notes (Signed)
[facility]ERGENC[facility] DEPARTMENT Provider Note   CSN: [id number] Arrival date & time: 06/07/17  1443     History   Chief Complaint No chief complaint on file.   HPI Jacqueline Burke is a 82 y.o. female.  The history is provided by the patient and medical records. No language interpreter was used.   Jacqueline Burke is a 82 y.o. female  with a PMH of HTN, HLD who presents to the Emergency Department complaining of acute onset of left-sided flank pain which radiates to the left back. Denies history of similar sxs. No medications taken prior to arrival for symptoms. No alleviating or aggravating factors. Denies dysuria, urinary urgency / frequency, but does feel like she has some pressure near her bladder area. Feels a little nauseous, but no vomiting. No fever, chills. Prior smoking who quit over 20 years ago.   Past Medical History:  Diagnosis Date  . Allergic rhinitis   . Chronic chest pain    and DOE w normal Stress cardiolyte  . Diverticulosis   . Heart murmur    Systolic heart murmur with Aortic valve sclerosis by ECHO  . Hyperlipidemia   . Hypertension   . Osteoarthritis    Erosive OA-MRI of R hand negative for synovitis, only OA-Dr Trudie Reed  . Osteopenia   . Pelvic prolapse   . PVC's (premature ventricular contractions)     Patient Active Problem List   Diagnosis Date Noted  . Edema extremities 02/16/2014  . Sinusitis, chronic 11/11/2013  . Dyspnea 11/11/2013  . PVC's (premature ventricular contractions) 08/19/2013  . Benign hypertensive heart disease without heart failure 08/15/2013    Past Surgical History:  Procedure Laterality Date  . BUNIONECTOMY  08/2006   R foot and hammer roe right second toe  . CARPAL TUNNEL RELEASE  2002  . TOTAL KNEE ARTHROPLASTY Left 08/2007  . TOTAL KNEE ARTHROPLASTY Right 03/2010    OB History    No data available       Home Medications    Prior to Admission medications   Medication Sig Start Date End Date  Taking? Authorizing Provider  Acetaminophen (ACETAMIN PO) Take 200 mg by mouth as directed. 2-6 Tablets daily----Arthritis    [provider]  amitriptyline (ELAVIL) 10 MG tablet Take 20 mg by mouth at bedtime. Uses for Chronic Pain    [provider]  amLODipine (NORVASC) 5 MG tablet Take 1 tablet (5 mg total) by mouth daily. 08/19/13   Sueanne Margarita, MD  Ascorbic Acid (VITAMIN C PO) Take 1 tablet by mouth daily.    [provider]  atorvastatin (LIPITOR) 10 MG tablet Take 10 mg by mouth daily.    [provider]  cephALEXin (KEFLEX) 500 MG capsule Take 1 capsule (500 mg total) by mouth 4 (four) times daily. 06/07/17   Ward, Ozella Almond, PA-C  cholecalciferol (VITAMIN D) 1000 UNITS tablet Take 1,000 Units by mouth daily.    [provider]  docusate sodium (COLACE) 100 MG capsule Take 300 mg by mouth daily.    [provider]  FIBER PO Take 2 tablets by mouth daily.    [provider]  fluticasone (FLONASE) 50 MCG/ACT nasal spray Place 2 sprays into both nostrils daily. 11/11/13   Collene Gobble, MD  gabapentin (NEURONTIN) 100 MG capsule Take 100 mg by mouth at bedtime.  01/16/17   [provider]  MYRBETRIQ 50 MG TB24 tablet Take 50 mg by mouth daily.  11/17/16  [provider]  ondansetron (ZOFRAN ODT) 4 MG disintegrating tablet Take 1 tablet (4 mg total) by mouth every 8 (eight) hours as needed for nausea or vomiting. 06/07/17   Ward, Ozella Almond, PA-C  oxyCODONE-acetaminophen (PERCOCET/ROXICET) 5-325 MG tablet Take 1-2 tablets by mouth every 4 (four) hours as needed for severe pain. 06/07/17   Ward, Ozella Almond, PA-C    Family History Family History  Problem Relation Age of Onset  . Stroke Mother   . Diabetes Mother   . Breast cancer Cousin     Social History Social History   Tobacco Use  . Smoking status: Former Smoker    Packs/day: 1.00    Years: 25.00    Pack years: 25.00    Types: Cigarettes     Last attempt to quit: 06/02/1993    Years since quitting: 24.0  . Smokeless tobacco: Never Used  Substance Use Topics  . Alcohol use: No  . Drug use: No     Allergies   Ciprofloxacin; Codeine; Demerol [meperidine]; Other; Ciprofloxacin hcl; Darvon [propoxyphene]; Hydrogen peroxide; and Tramadol   Review of Systems Review of Systems  Gastrointestinal: Positive for nausea. Negative for abdominal pain, blood in stool, constipation, diarrhea and vomiting.  Genitourinary: Positive for flank pain. Negative for dysuria.  Musculoskeletal: Positive for back pain.  All other systems reviewed and are negative.    Physical Exam Updated Vital Signs BP (!) 123/53   Pulse 90   Temp 98.7 F (37.1 C) (Oral)   Resp 16   Ht 5\' 1"  (1.549 m)   Wt 77.1 kg (170 lb)   SpO2 94%   BMI 32.12 kg/m   Physical Exam  Constitutional: She is oriented to person, place, and time. She appears well-developed and well-nourished. No distress.  Appears uncomfortable. Non-toxic appearing.  HENT:  Head: Normocephalic and atraumatic.  Neck: Neck supple.  Cardiovascular: Normal rate, regular rhythm and normal heart sounds.  No murmur heard. Pulmonary/Chest: Effort normal and breath sounds normal. No respiratory distress.  Abdominal: Soft. She exhibits no distension.  No abdominal tenderness. She does have tenderness to the left flank and CVA region.  Neurological: She is alert and oriented to person, place, and time.  Skin: Skin is warm and dry.  Nursing note and vitals reviewed.    ED Treatments / Results  Labs (all labs ordered are listed, but only abnormal results are displayed) Labs Reviewed  URINALYSIS, ROUTINE W REFLEX MICROSCOPIC - Abnormal; Notable for the following components:      Result Value   APPearance CLOUDY (*)    Specific Gravity, Urine >1.030 (*)    Hgb urine dipstick SMALL (*)    Ketones, ur 15 (*)    Protein, ur 30 (*)    Leukocytes, UA MODERATE (*)    All other components  within normal limits  CBC - Abnormal; Notable for the following components:   WBC 16.5 (*)    All other components within normal limits  BASIC METABOLIC PANEL - Abnormal; Notable for the following components:   Glucose, Bld 137 (*)    Creatinine, Ser 1.09 (*)    GFR calc non Af Amer 47 (*)    GFR calc Af Amer 54 (*)    All other components within normal limits  URINALYSIS, MICROSCOPIC (REFLEX) - Abnormal; Notable for the following components:   Bacteria, UA FEW (*)    Squamous Epithelial / LPF 6-30 (*)    All other components within normal limits  URINE CULTURE  EKG  EKG Interpretation None       Radiology Ct Renal Stone Study  Result Date: 06/07/2017 CLINICAL DATA:  Right-sided abdominal pain radiates to the back. EXAM: CT ABDOMEN AND PELVIS WITHOUT CONTRAST TECHNIQUE: Multidetector CT imaging of the abdomen and pelvis was performed following the standard protocol without IV contrast. COMPARISON:  04/03/2010 FINDINGS: Lower chest:  No acute findings. Hepatobiliary: No focal abnormality in the liver on this study without intravenous contrast. There is no evidence for gallstones, gallbladder wall thickening, or pericholecystic fluid. No intrahepatic or extrahepatic biliary dilation. Pancreas: No focal mass lesion. No dilatation of the main duct. No intraparenchymal cyst. No peripancreatic edema. Spleen: No splenomegaly. No focal mass lesion. Adrenals/Urinary Tract: No adrenal nodule or mass. 2 mm nonobstructing stone identified interpolar right kidney. No right ureteral stone. 7 x 6 x 10 mm stone is identified in the proximal left ureter causing mild left hydronephrosis with perinephric edema. No other left ureteral stone. No bladder stone. Stomach/Bowel: Small hiatal hernia. Stomach is nondistended. No gastric wall thickening. No evidence of outlet obstruction. Duodenum is normally positioned as is the ligament of Treitz. No small bowel wall thickening. No small bowel dilatation. The  appendix is normal. No gross colonic mass. No colonic wall thickening. Diffuse colonic diverticulosis is most advanced in the left colon. No evidence for diverticulitis. Vascular/Lymphatic: There is abdominal aortic atherosclerosis without aneurysm. There is no gastrohepatic or hepatoduodenal ligament lymphadenopathy. No intraperitoneal or retroperitoneal lymphadenopathy. No pelvic sidewall lymphadenopathy. Reproductive: Uterus unremarkable. There is no adnexal mass. 18 mm cystic focus in the left ovary was 3.4 cm on the prior study. Other: No intraperitoneal free fluid. Musculoskeletal: Degenerative changes noted lumbar spine with left-sided pars interarticularis defect at L5. IMPRESSION: 1. 7 x 6 x 10 mm stone in the proximal left ureter causes mild left hydronephrosis and prominent left perinephric edema. 2. Tiny nonobstructing stone right kidney. 3. Diffuse colonic diverticulosis without diverticulitis. 4.  Aortic Atherosclerois (ICD10-170.0) Electronically Signed   By: Misty Stanley M.D.   On: 06/07/2017 18:52    Procedures Procedures (including critical care time)  Medications Ordered in ED Medications  morphine 4 MG/ML injection 4 mg (4 mg Intramuscular Given 06/07/17 1631)  ondansetron (ZOFRAN) injection 4 mg (4 mg Intravenous Given 06/07/17 1743)  fentaNYL (SUBLIMAZE) injection 50 mcg (50 mcg Intravenous Given 06/07/17 1743)  sodium chloride 0.9 % bolus 500 mL (0 mLs Intravenous Stopped 06/07/17 2105)  ketorolac (TORADOL) 30 MG/ML injection 30 mg (30 mg Intravenous Given 06/07/17 1949)  cephALEXin (KEFLEX) capsule 500 mg (500 mg Oral Given 06/07/17 2055)     Initial Impression / Assessment and Plan / ED Course  I have reviewed the triage vital signs and the nursing notes.  Pertinent labs & imaging results that were available during my care of the patient were reviewed by me and considered in my medical decision making (see chart for details).    Jacqueline Burke is a 82 y.o. female who presents to  ED for acute onset of sided flank pain.  Afebrile, VSS.  Tenderness to the left flank.  Concern for stone.  Will obtain labs, urine and CT renal study.  Blood work shows leukocytosis of 16.5 and mild bump in kidney function with creatinine of 1.09.  Urine shows moderate leukocytes and too numerous to count white cells, however few bacteria and 6-30 squamous cells.  CT shows a 7 x 6 x 10 mm stone in the proximal left ureter with mild left hydro-and prominent left  perinephric edema.  [facility], Dr. Diona Fanti, who recommends obtaining urine culture and giving Toradol then recheck patient.  If pain is controlled, will discharge home with urology follow-up and on Keflex.  If pain not controlled will call him back.   Weighted following Toradol administration.  She feels very much improved.  She would like to go home.  Husband at bedside feels comfortable with this as well.  She has been seen by [facility] urology in the past and agrees to call in the morning to arrange for follow-up appointment.  I spoke with patient and husband at length about reasons to return to the emergency department and importance of antibiotics.  Will give Rx for short course of percocet and zofran along with keflex as recommended by urology. All questions answered.   Patient discussed with Dr. Leonette Monarch who agrees with treatment plan.   Final Clinical Impressions(s) / ED Diagnoses   Final diagnoses:  Kidney stone    ED Discharge Orders        Ordered    cephALEXin (KEFLEX) 500 MG capsule  4 times daily     06/07/17 2036    oxyCODONE-acetaminophen (PERCOCET/ROXICET) 5-325 MG tablet  Every 4 hours PRN     06/07/17 2037    ondansetron (ZOFRAN ODT) 4 MG disintegrating tablet  Every 8 hours PRN     06/07/17 2037       Ward, Ozella Almond, PA-C 06/07/17 2319    Fatima Blank, MD 06/11/17 1840

## 2017-06-07 NOTE — Discharge Instructions (Signed)
It was my pleasure taking care of you today!   You have been diagnosed with kidney stone. Drink plenty of fluids to help you pass the stone. Please take all of your antibiotics until finished!   Take ibuprofen as directed with food for mild to moderate pain. Use your pain medication as directed and  as needed for severe pain. Use Zofran for nausea as directed.  Call the urology clinic first thing in the morning to schedule a follow up appointment.   Return to the ED immediately if you develop fever that persists > 101, uncontrolled pain or vomiting, or other concerns.

## 2017-06-07 NOTE — ED Notes (Signed)
Pt ambulatory to bathroom to provide urine sample for culture

## 2017-06-09 ENCOUNTER — Encounter (HOSPITAL_COMMUNITY)
Admission: RE | Disposition: A | Discharge status: Home / Self Care | Payer: Self-pay | Source: Ambulatory Visit | Attending: Urology

## 2017-06-09 ENCOUNTER — Observation Stay (HOSPITAL_COMMUNITY): Payer: PPO

## 2017-06-09 ENCOUNTER — Observation Stay (HOSPITAL_COMMUNITY): Payer: PPO | Admitting: Certified Registered Nurse Anesthetist

## 2017-06-09 ENCOUNTER — Inpatient Hospital Stay (HOSPITAL_COMMUNITY)
Admission: RE | Admit: 2017-06-09 | Discharge: 2017-06-16 | DRG: 853 | Disposition: A | Discharge status: Home / Self Care | Payer: PPO | Source: Ambulatory Visit | Attending: Internal Medicine | Admitting: Internal Medicine

## 2017-06-09 ENCOUNTER — Ambulatory Visit (HOSPITAL_COMMUNITY): Payer: PPO

## 2017-06-09 ENCOUNTER — Encounter (HOSPITAL_COMMUNITY): Payer: Self-pay | Admitting: *Deleted

## 2017-06-09 ENCOUNTER — Other Ambulatory Visit: Payer: Self-pay

## 2017-06-09 ENCOUNTER — Ambulatory Visit (HOSPITAL_COMMUNITY): Payer: PPO | Admitting: Anesthesiology

## 2017-06-09 ENCOUNTER — Other Ambulatory Visit: Payer: Self-pay | Admitting: Urology

## 2017-06-09 DIAGNOSIS — N201 Calculus of ureter: Secondary | ICD-10-CM | POA: Diagnosis not present

## 2017-06-09 DIAGNOSIS — I214 Non-ST elevation (NSTEMI) myocardial infarction: Secondary | ICD-10-CM

## 2017-06-09 DIAGNOSIS — N12 Tubulo-interstitial nephritis, not specified as acute or chronic: Secondary | ICD-10-CM

## 2017-06-09 DIAGNOSIS — R6520 Severe sepsis without septic shock: Secondary | ICD-10-CM | POA: Diagnosis present

## 2017-06-09 DIAGNOSIS — B9620 Unspecified Escherichia coli [E. coli] as the cause of diseases classified elsewhere: Secondary | ICD-10-CM

## 2017-06-09 DIAGNOSIS — Z8249 Family history of ischemic heart disease and other diseases of the circulatory system: Secondary | ICD-10-CM

## 2017-06-09 DIAGNOSIS — Z0189 Encounter for other specified special examinations: Secondary | ICD-10-CM

## 2017-06-09 DIAGNOSIS — I9788 Other intraoperative complications of the circulatory system, not elsewhere classified: Secondary | ICD-10-CM | POA: Diagnosis not present

## 2017-06-09 DIAGNOSIS — F419 Anxiety disorder, unspecified: Secondary | ICD-10-CM | POA: Diagnosis present

## 2017-06-09 DIAGNOSIS — I350 Nonrheumatic aortic (valve) stenosis: Secondary | ICD-10-CM | POA: Diagnosis present

## 2017-06-09 DIAGNOSIS — A419 Sepsis, unspecified organism: Principal | ICD-10-CM | POA: Diagnosis present

## 2017-06-09 DIAGNOSIS — Z79899 Other long term (current) drug therapy: Secondary | ICD-10-CM

## 2017-06-09 DIAGNOSIS — J9601 Acute respiratory failure with hypoxia: Secondary | ICD-10-CM

## 2017-06-09 DIAGNOSIS — Z87891 Personal history of nicotine dependence: Secondary | ICD-10-CM

## 2017-06-09 DIAGNOSIS — Z96653 Presence of artificial knee joint, bilateral: Secondary | ICD-10-CM | POA: Diagnosis present

## 2017-06-09 DIAGNOSIS — I10 Essential (primary) hypertension: Secondary | ICD-10-CM | POA: Diagnosis present

## 2017-06-09 DIAGNOSIS — Z881 Allergy status to other antibiotic agents status: Secondary | ICD-10-CM

## 2017-06-09 DIAGNOSIS — I472 Ventricular tachycardia: Secondary | ICD-10-CM | POA: Diagnosis not present

## 2017-06-09 DIAGNOSIS — J95821 Acute postprocedural respiratory failure: Secondary | ICD-10-CM | POA: Diagnosis not present

## 2017-06-09 DIAGNOSIS — G8929 Other chronic pain: Secondary | ICD-10-CM | POA: Diagnosis present

## 2017-06-09 DIAGNOSIS — E7800 Pure hypercholesterolemia, unspecified: Secondary | ICD-10-CM | POA: Diagnosis present

## 2017-06-09 DIAGNOSIS — N3289 Other specified disorders of bladder: Secondary | ICD-10-CM | POA: Diagnosis present

## 2017-06-09 DIAGNOSIS — E785 Hyperlipidemia, unspecified: Secondary | ICD-10-CM | POA: Diagnosis present

## 2017-06-09 DIAGNOSIS — M1990 Unspecified osteoarthritis, unspecified site: Secondary | ICD-10-CM | POA: Diagnosis present

## 2017-06-09 DIAGNOSIS — N179 Acute kidney failure, unspecified: Secondary | ICD-10-CM | POA: Diagnosis present

## 2017-06-09 DIAGNOSIS — N136 Pyonephrosis: Secondary | ICD-10-CM | POA: Diagnosis present

## 2017-06-09 DIAGNOSIS — E876 Hypokalemia: Secondary | ICD-10-CM | POA: Diagnosis present

## 2017-06-09 DIAGNOSIS — R7881 Bacteremia: Secondary | ICD-10-CM

## 2017-06-09 DIAGNOSIS — Z978 Presence of other specified devices: Secondary | ICD-10-CM

## 2017-06-09 DIAGNOSIS — Z823 Family history of stroke: Secondary | ICD-10-CM

## 2017-06-09 DIAGNOSIS — Z885 Allergy status to narcotic agent status: Secondary | ICD-10-CM

## 2017-06-09 DIAGNOSIS — D649 Anemia, unspecified: Secondary | ICD-10-CM | POA: Diagnosis present

## 2017-06-09 DIAGNOSIS — R32 Unspecified urinary incontinence: Secondary | ICD-10-CM | POA: Diagnosis present

## 2017-06-09 DIAGNOSIS — R0603 Acute respiratory distress: Secondary | ICD-10-CM

## 2017-06-09 DIAGNOSIS — I493 Ventricular premature depolarization: Secondary | ICD-10-CM | POA: Diagnosis present

## 2017-06-09 DIAGNOSIS — Z803 Family history of malignant neoplasm of breast: Secondary | ICD-10-CM

## 2017-06-09 DIAGNOSIS — R011 Cardiac murmur, unspecified: Secondary | ICD-10-CM | POA: Diagnosis present

## 2017-06-09 DIAGNOSIS — I9589 Other hypotension: Secondary | ICD-10-CM | POA: Diagnosis not present

## 2017-06-09 DIAGNOSIS — N390 Urinary tract infection, site not specified: Secondary | ICD-10-CM | POA: Diagnosis not present

## 2017-06-09 DIAGNOSIS — Z4682 Encounter for fitting and adjustment of non-vascular catheter: Secondary | ICD-10-CM | POA: Diagnosis not present

## 2017-06-09 DIAGNOSIS — Z833 Family history of diabetes mellitus: Secondary | ICD-10-CM

## 2017-06-09 DIAGNOSIS — M8580 Other specified disorders of bone density and structure, unspecified site: Secondary | ICD-10-CM | POA: Diagnosis present

## 2017-06-09 DIAGNOSIS — Z955 Presence of coronary angioplasty implant and graft: Secondary | ICD-10-CM

## 2017-06-09 DIAGNOSIS — Z79891 Long term (current) use of opiate analgesic: Secondary | ICD-10-CM

## 2017-06-09 DIAGNOSIS — R079 Chest pain, unspecified: Secondary | ICD-10-CM

## 2017-06-09 HISTORY — PX: CYSTOSCOPY W/ URETERAL STENT PLACEMENT: SHX1429

## 2017-06-09 LAB — BASIC METABOLIC PANEL
Anion gap: 12 (ref 5–15)
BUN: 49 mg/dL — AB (ref 6–20)
CHLORIDE: 103 mmol/L (ref 101–111)
CO2: 22 mmol/L (ref 22–32)
CREATININE: 2.28 mg/dL — AB (ref 0.44–1.00)
Calcium: 8.7 mg/dL — ABNORMAL LOW (ref 8.9–10.3)
GFR calc Af Amer: 22 mL/min — ABNORMAL LOW (ref >60)
GFR calc non Af Amer: 19 mL/min — ABNORMAL LOW (ref >60)
GLUCOSE: 106 mg/dL — AB (ref 65–99)
Potassium: 4.2 mmol/L (ref 3.5–5.1)
Sodium: 137 mmol/L (ref 135–145)

## 2017-06-09 LAB — POCT I-STAT 7, (LYTES, BLD GAS, ICA,H+H)
ACID-BASE DEFICIT: 3 mmol/L — AB (ref 0.0–2.0)
BICARBONATE: 21.9 mmol/L (ref 20.0–28.0)
Calcium, Ion: 1.1 mmol/L — ABNORMAL LOW (ref 1.15–1.40)
HEMATOCRIT: 30 % — AB (ref 36.0–46.0)
Hemoglobin: 10.2 g/dL — ABNORMAL LOW (ref 12.0–15.0)
O2 Saturation: 100 %
PH ART: 7.366 (ref 7.350–7.450)
POTASSIUM: 3.9 mmol/L (ref 3.5–5.1)
Patient temperature: 102.2
SODIUM: 135 mmol/L (ref 135–145)
TCO2: 23 mmol/L (ref 22–32)
pCO2 arterial: 39 mmHg (ref 32.0–48.0)
pO2, Arterial: 391 mmHg — ABNORMAL HIGH (ref 83.0–108.0)

## 2017-06-09 LAB — APTT: aPTT: 39 seconds — ABNORMAL HIGH (ref 24–36)

## 2017-06-09 LAB — CBC WITH DIFFERENTIAL/PLATELET
Basophils Absolute: 0 10*3/uL (ref 0.0–0.1)
Basophils Relative: 1 %
EOS ABS: 0 10*3/uL (ref 0.0–0.7)
EOS PCT: 1 %
HCT: 36.5 % (ref 36.0–46.0)
Hemoglobin: 11.4 g/dL — ABNORMAL LOW (ref 12.0–15.0)
LYMPHS ABS: 1.1 10*3/uL (ref 0.7–4.0)
Lymphocytes Relative: 27 %
MCH: 29.4 pg (ref 26.0–34.0)
MCHC: 31.2 g/dL (ref 30.0–36.0)
MCV: 94.1 fL (ref 78.0–100.0)
MONOS PCT: 1 %
Monocytes Absolute: 0 10*3/uL — ABNORMAL LOW (ref 0.1–1.0)
Neutro Abs: 2.7 10*3/uL (ref 1.7–7.7)
Neutrophils Relative %: 70 %
PLATELETS: 178 10*3/uL (ref 150–400)
RBC: 3.88 MIL/uL (ref 3.87–5.11)
RDW: 14.5 % (ref 11.5–15.5)
WBC: 3.9 10*3/uL — ABNORMAL LOW (ref 4.0–10.5)

## 2017-06-09 LAB — LACTIC ACID, PLASMA
LACTIC ACID, VENOUS: 1.4 mmol/L (ref 0.5–1.9)
LACTIC ACID, VENOUS: 1.1 mmol/L (ref 0.5–1.9)

## 2017-06-09 LAB — TYPE AND SCREEN
ABO/RH(D): O POS
Antibody Screen: NEGATIVE

## 2017-06-09 LAB — PROTIME-INR
INR: 1.15
Prothrombin Time: 14.6 seconds (ref 11.4–15.2)

## 2017-06-09 LAB — CORTISOL: Cortisol, Plasma: 29.6 ug/dL

## 2017-06-09 LAB — MRSA PCR SCREENING: MRSA by PCR: NEGATIVE

## 2017-06-09 LAB — PROCALCITONIN: Procalcitonin: 41.45 ng/mL

## 2017-06-09 LAB — TROPONIN I: Troponin I: 19.37 ng/mL (ref <0.03)

## 2017-06-09 SURGERY — CYSTOSCOPY, WITH RETROGRADE PYELOGRAM AND URETERAL STENT INSERTION
Anesthesia: General | Laterality: Left

## 2017-06-09 MED ORDER — LIDOCAINE 2% (20 MG/ML) 5 ML SYRINGE
INTRAMUSCULAR | Status: DC | PRN
  Administered 2017-06-09: 100 mg via INTRAVENOUS

## 2017-06-09 MED ORDER — CEFAZOLIN SODIUM-DEXTROSE 2-4 GM/100ML-% IV SOLN
INTRAVENOUS | Status: AC
  Filled 2017-06-09: qty 100

## 2017-06-09 MED ORDER — PROPOFOL 10 MG/ML IV BOLUS
INTRAVENOUS | Status: AC
  Filled 2017-06-09: qty 20

## 2017-06-09 MED ORDER — OXYBUTYNIN CHLORIDE 5 MG PO TABS
5.0000 mg | ORAL_TABLET | Freq: Three times a day (TID) | ORAL | Status: DC | PRN
  Filled 2017-06-09: qty 1

## 2017-06-09 MED ORDER — DOCUSATE SODIUM 100 MG PO CAPS
100.0000 mg | ORAL_CAPSULE | Freq: Two times a day (BID) | ORAL | Status: DC
  Administered 2017-06-10 – 2017-06-16 (×8): 100 mg via ORAL
  Filled 2017-06-09 (×10): qty 1

## 2017-06-09 MED ORDER — MORPHINE SULFATE (PF) 4 MG/ML IV SOLN: 2.0000 mg | INTRAVENOUS | Status: DC | PRN

## 2017-06-09 MED ORDER — SENNA 8.6 MG PO TABS
1.0000 | ORAL_TABLET | Freq: Two times a day (BID) | ORAL | Status: DC
  Administered 2017-06-10: 8.6 mg via ORAL
  Filled 2017-06-09: qty 1

## 2017-06-09 MED ORDER — PHENYLEPHRINE HCL-NACL 10-0.9 MG/250ML-% IV SOLN
0.0000 ug/min | INTRAVENOUS | Status: DC
  Administered 2017-06-09: 10 ug/min via INTRAVENOUS

## 2017-06-09 MED ORDER — ENOXAPARIN SODIUM 40 MG/0.4ML ~~LOC~~ SOLN: 40.0000 mg | SUBCUTANEOUS | Status: DC

## 2017-06-09 MED ORDER — FUROSEMIDE 10 MG/ML IJ SOLN: 20.0000 mg | Freq: Once | INTRAMUSCULAR | Status: DC

## 2017-06-09 MED ORDER — HYDROMORPHONE HCL 1 MG/ML IJ SOLN: 0.2500 mg | INTRAMUSCULAR | Status: DC | PRN

## 2017-06-09 MED ORDER — DIPHENHYDRAMINE HCL 50 MG/ML IJ SOLN
INTRAMUSCULAR | Status: AC
  Filled 2017-06-09: qty 1

## 2017-06-09 MED ORDER — ONDANSETRON HCL 4 MG/2ML IJ SOLN: 4.0000 mg | INTRAMUSCULAR | Status: DC | PRN

## 2017-06-09 MED ORDER — FAMOTIDINE IN NACL 20-0.9 MG/50ML-% IV SOLN: 20.0000 mg | Freq: Two times a day (BID) | INTRAVENOUS | Status: DC

## 2017-06-09 MED ORDER — DIPHENHYDRAMINE HCL 12.5 MG/5ML PO ELIX
12.5000 mg | ORAL_SOLUTION | Freq: Four times a day (QID) | ORAL | Status: DC | PRN
  Filled 2017-06-09: qty 5

## 2017-06-09 MED ORDER — SUCCINYLCHOLINE CHLORIDE 20 MG/ML IJ SOLN
INTRAMUSCULAR | Status: DC | PRN
  Administered 2017-06-09: 75 mg via INTRAVENOUS

## 2017-06-09 MED ORDER — MIDAZOLAM HCL 2 MG/2ML IJ SOLN
INTRAMUSCULAR | Status: AC
Start: 2017-06-09 — End: 2017-06-10
  Filled 2017-06-09: qty 2

## 2017-06-09 MED ORDER — BELLADONNA ALKALOIDS-OPIUM 16.2-60 MG RE SUPP: 1.0000 | Freq: Four times a day (QID) | RECTAL | Status: DC | PRN

## 2017-06-09 MED ORDER — FENTANYL CITRATE (PF) 100 MCG/2ML IJ SOLN
INTRAMUSCULAR | Status: DC | PRN
  Administered 2017-06-09: 50 ug via INTRAVENOUS
  Administered 2017-06-09: 100 ug via INTRAVENOUS
  Administered 2017-06-09: 25 ug via INTRAVENOUS

## 2017-06-09 MED ORDER — EPHEDRINE SULFATE 50 MG/ML IJ SOLN
20.0000 mg | Freq: Once | INTRAMUSCULAR | Status: AC
  Administered 2017-06-09 (×2): 10 mg via INTRAVENOUS

## 2017-06-09 MED ORDER — ACETAMINOPHEN 10 MG/ML IV SOLN
INTRAVENOUS | Status: AC
  Administered 2017-06-09: 1000 mg via INTRAVENOUS
  Filled 2017-06-09: qty 100

## 2017-06-09 MED ORDER — FENTANYL CITRATE (PF) 100 MCG/2ML IJ SOLN
INTRAMUSCULAR | Status: AC
  Administered 2017-06-09: 100 ug
  Filled 2017-06-09: qty 2

## 2017-06-09 MED ORDER — HYDROCODONE-ACETAMINOPHEN 5-325 MG PO TABS
1.0000 | ORAL_TABLET | ORAL | Status: DC | PRN
  Administered 2017-06-10: 1 via ORAL
  Filled 2017-06-09: qty 1

## 2017-06-09 MED ORDER — ACETAMINOPHEN 325 MG PO TABS: 650.0000 mg | ORAL_TABLET | ORAL | Status: DC | PRN

## 2017-06-09 MED ORDER — PROMETHAZINE HCL 25 MG/ML IJ SOLN: 6.2500 mg | INTRAMUSCULAR | Status: DC | PRN

## 2017-06-09 MED ORDER — SODIUM CHLORIDE 0.9 % IV BOLUS (SEPSIS): 1000.0000 mL | Freq: Once | INTRAVENOUS | Status: AC

## 2017-06-09 MED ORDER — ORAL CARE MOUTH RINSE
15.0000 mL | Freq: Four times a day (QID) | OROMUCOSAL | Status: DC
  Administered 2017-06-10 (×2): 15 mL via OROMUCOSAL

## 2017-06-09 MED ORDER — MIDAZOLAM HCL 2 MG/2ML IJ SOLN
1.0000 mg | INTRAMUSCULAR | Status: DC | PRN
  Filled 2017-06-09: qty 2

## 2017-06-09 MED ORDER — HYDROCODONE-ACETAMINOPHEN 7.5-325 MG PO TABS: 1.0000 | ORAL_TABLET | Freq: Once | ORAL | Status: DC | PRN

## 2017-06-09 MED ORDER — PHENYLEPHRINE 40 MCG/ML (10ML) SYRINGE FOR IV PUSH (FOR BLOOD PRESSURE SUPPORT)
120.0000 ug | PREFILLED_SYRINGE | Freq: Once | INTRAVENOUS | Status: AC
  Administered 2017-06-09: 120 ug via INTRAVENOUS
  Filled 2017-06-09: qty 5

## 2017-06-09 MED ORDER — MIDAZOLAM HCL 2 MG/2ML IJ SOLN: 2.0000 mg | Freq: Once | INTRAMUSCULAR | Status: DC

## 2017-06-09 MED ORDER — DEXMEDETOMIDINE HCL IN NACL 200 MCG/50ML IV SOLN: 0.0000 ug/kg/h | INTRAVENOUS | Status: DC

## 2017-06-09 MED ORDER — DEXMEDETOMIDINE HCL IN NACL 200 MCG/50ML IV SOLN
INTRAVENOUS | Status: AC
  Filled 2017-06-09: qty 100

## 2017-06-09 MED ORDER — PIPERACILLIN-TAZOBACTAM IN DEX 2-0.25 GM/50ML IV SOLN
2.2500 g | Freq: Four times a day (QID) | INTRAVENOUS | Status: DC
  Administered 2017-06-09 – 2017-06-10 (×3): 2.25 g via INTRAVENOUS
  Filled 2017-06-09 (×4): qty 50

## 2017-06-09 MED ORDER — CHLORHEXIDINE GLUCONATE 0.12% ORAL RINSE (MEDLINE KIT)
15.0000 mL | Freq: Two times a day (BID) | OROMUCOSAL | Status: DC
  Administered 2017-06-09 – 2017-06-11 (×3): 15 mL via OROMUCOSAL

## 2017-06-09 MED ORDER — VANCOMYCIN HCL 10 G IV SOLR
1500.0000 mg | Freq: Once | INTRAVENOUS | Status: AC
  Administered 2017-06-09: 1500 mg via INTRAVENOUS
  Filled 2017-06-09: qty 1500

## 2017-06-09 MED ORDER — SUCCINYLCHOLINE CHLORIDE 200 MG/10ML IV SOSY
PREFILLED_SYRINGE | INTRAVENOUS | Status: AC
  Filled 2017-06-09: qty 10

## 2017-06-09 MED ORDER — SUCCINYLCHOLINE CHLORIDE 200 MG/10ML IV SOSY
PREFILLED_SYRINGE | INTRAVENOUS | Status: AC
Start: 2017-06-09 — End: 2017-06-09
  Filled 2017-06-09: qty 10

## 2017-06-09 MED ORDER — DEXMEDETOMIDINE HCL IN NACL 200 MCG/50ML IV SOLN
0.4000 ug/kg/h | INTRAVENOUS | Status: DC
  Administered 2017-06-09: 0.4 ug/kg/h via INTRAVENOUS

## 2017-06-09 MED ORDER — DEXTROSE 5 % IV SOLN: 2.0000 g | Freq: Once | INTRAVENOUS | Status: DC

## 2017-06-09 MED ORDER — SODIUM CHLORIDE 0.9 % IV SOLN
INTRAVENOUS | Status: DC | PRN
  Administered 2017-06-09: 17 mL

## 2017-06-09 MED ORDER — FUROSEMIDE 10 MG/ML IJ SOLN
INTRAMUSCULAR | Status: AC
  Administered 2017-06-09: 20 mg
  Filled 2017-06-09: qty 4

## 2017-06-09 MED ORDER — FAMOTIDINE IN NACL 20-0.9 MG/50ML-% IV SOLN
20.0000 mg | INTRAVENOUS | Status: DC
  Administered 2017-06-10: 20 mg via INTRAVENOUS
  Filled 2017-06-09: qty 50

## 2017-06-09 MED ORDER — PHENYLEPHRINE HCL-NACL 10-0.9 MG/250ML-% IV SOLN
20.0000 ug/min | INTRAVENOUS | Status: DC
  Administered 2017-06-09: 10 ug/min via INTRAVENOUS

## 2017-06-09 MED ORDER — DIPHENHYDRAMINE HCL 50 MG/ML IJ SOLN: 12.5000 mg | Freq: Four times a day (QID) | INTRAMUSCULAR | Status: DC | PRN

## 2017-06-09 MED ORDER — PHENYLEPHRINE HCL-NACL 10-0.9 MG/250ML-% IV SOLN
INTRAVENOUS | Status: AC
  Administered 2017-06-09: 10 ug/min via INTRAVENOUS
  Filled 2017-06-09: qty 250

## 2017-06-09 MED ORDER — CHLORHEXIDINE GLUCONATE 0.12% ORAL RINSE (MEDLINE KIT): 15.0000 mL | Freq: Two times a day (BID) | OROMUCOSAL | Status: DC

## 2017-06-09 MED ORDER — MIDAZOLAM HCL 2 MG/2ML IJ SOLN: 1.0000 mg | INTRAMUSCULAR | Status: DC | PRN

## 2017-06-09 MED ORDER — LACTATED RINGERS IV SOLN
INTRAVENOUS | Status: DC
  Administered 2017-06-09: 13:00:00 via INTRAVENOUS

## 2017-06-09 MED ORDER — 0.9 % SODIUM CHLORIDE (POUR BTL) OPTIME
TOPICAL | Status: DC | PRN
  Administered 2017-06-09: 1000 mL

## 2017-06-09 MED ORDER — MEPERIDINE HCL 50 MG/ML IJ SOLN: 6.2500 mg | INTRAMUSCULAR | Status: DC | PRN

## 2017-06-09 MED ORDER — SUCCINYLCHOLINE CHLORIDE 20 MG/ML IJ SOLN
INTRAMUSCULAR | Status: DC | PRN
  Administered 2017-06-09: 80 mg via INTRAVENOUS

## 2017-06-09 MED ORDER — MIDAZOLAM HCL 2 MG/2ML IJ SOLN
INTRAMUSCULAR | Status: AC
  Filled 2017-06-09: qty 2

## 2017-06-09 MED ORDER — FENTANYL CITRATE (PF) 100 MCG/2ML IJ SOLN: 50.0000 ug | INTRAMUSCULAR | Status: DC | PRN

## 2017-06-09 MED ORDER — PHENYLEPHRINE HCL 10 MG/ML IJ SOLN
INTRAMUSCULAR | Status: DC | PRN
  Administered 2017-06-09: 80 ug via INTRAVENOUS
  Administered 2017-06-09: 120 ug via INTRAVENOUS
  Administered 2017-06-09 (×2): 80 ug via INTRAVENOUS

## 2017-06-09 MED ORDER — PROPOFOL 10 MG/ML IV BOLUS
INTRAVENOUS | Status: DC | PRN
  Administered 2017-06-09: 80 mg via INTRAVENOUS

## 2017-06-09 MED ORDER — STERILE WATER FOR IRRIGATION IR SOLN
Status: DC | PRN
  Administered 2017-06-09: 500 mL

## 2017-06-09 MED ORDER — CEFAZOLIN SODIUM-DEXTROSE 2-4 GM/100ML-% IV SOLN
2.0000 g | Freq: Once | INTRAVENOUS | Status: AC
  Administered 2017-06-09: 2 g via INTRAVENOUS

## 2017-06-09 MED ORDER — SODIUM CHLORIDE 0.9 % IR SOLN
Status: DC | PRN
  Administered 2017-06-09: 3000 mL via INTRAVESICAL

## 2017-06-09 MED ORDER — ORAL CARE MOUTH RINSE: 15.0000 mL | Freq: Four times a day (QID) | OROMUCOSAL | Status: DC

## 2017-06-09 MED ORDER — FENTANYL CITRATE (PF) 100 MCG/2ML IJ SOLN
INTRAMUSCULAR | Status: AC
  Filled 2017-06-09: qty 2

## 2017-06-09 MED ORDER — DEXAMETHASONE SODIUM PHOSPHATE 10 MG/ML IJ SOLN
INTRAMUSCULAR | Status: DC | PRN
  Administered 2017-06-09: 10 mg via INTRAVENOUS

## 2017-06-09 MED ORDER — SODIUM CHLORIDE 0.9 % IV SOLN
INTRAVENOUS | Status: DC
  Administered 2017-06-09 – 2017-06-10 (×3): via INTRAVENOUS

## 2017-06-09 MED ORDER — ACETAMINOPHEN 10 MG/ML IV SOLN
1000.0000 mg | Freq: Once | INTRAVENOUS | Status: DC | PRN
Start: 2017-06-09 — End: 2017-06-09
  Administered 2017-06-09: 1000 mg via INTRAVENOUS

## 2017-06-09 MED ORDER — SODIUM CHLORIDE 0.9 % IV SOLN: 500.0000 mg | INTRAVENOUS | Status: DC

## 2017-06-09 MED ORDER — PIPERACILLIN-TAZOBACTAM 3.375 G IVPB: 3.3750 g | Freq: Three times a day (TID) | INTRAVENOUS | Status: DC

## 2017-06-09 MED ORDER — SODIUM CHLORIDE 0.9 % IV BOLUS (SEPSIS)
500.0000 mL | Freq: Once | INTRAVENOUS | Status: AC
  Administered 2017-06-09: 500 mL via INTRAVENOUS

## 2017-06-09 MED ORDER — MIDAZOLAM HCL 2 MG/2ML IJ SOLN
2.0000 mg | Freq: Once | INTRAMUSCULAR | Status: AC
  Administered 2017-06-09: 2 mg via INTRAVENOUS

## 2017-06-09 MED ORDER — FENTANYL CITRATE (PF) 100 MCG/2ML IJ SOLN: 100.0000 ug | Freq: Once | INTRAMUSCULAR | Status: DC

## 2017-06-09 SURGICAL SUPPLY — 14 items
BAG URO CATCHER STRL LF (MISCELLANEOUS) ×3 IMPLANT
CATH FOLEY 2WAY SLVR 18FR 30CC (CATHETERS) ×2 IMPLANT
CATH INTERMIT  6FR 70CM (CATHETERS) ×3 IMPLANT
CLOTH BEACON ORANGE TIMEOUT ST (SAFETY) ×3 IMPLANT
COVER FOOTSWITCH UNIV (MISCELLANEOUS) IMPLANT
COVER SURGICAL LIGHT HANDLE (MISCELLANEOUS) ×3 IMPLANT
GLOVE BIOGEL M STRL SZ7.5 (GLOVE) ×3 IMPLANT
GOWN STRL REUS W/TWL LRG LVL3 (GOWN DISPOSABLE) ×6 IMPLANT
GUIDEWIRE STR DUAL SENSOR (WIRE) ×3 IMPLANT
MANIFOLD NEPTUNE II (INSTRUMENTS) ×3 IMPLANT
PACK CYSTO (CUSTOM PROCEDURE TRAY) ×3 IMPLANT
STENT URET 6FRX24 CONTOUR (STENTS) ×2 IMPLANT
TUBING CONNECTING 10 (TUBING) ×2 IMPLANT
TUBING CONNECTING 10' (TUBING) ×1

## 2017-06-09 NOTE — Consult Note (Signed)
PULMONARY / CRITICAL CARE MEDICINE   Name: Jacqueline Burke MRN: [id number] DOB: [birth year]    ADMISSION DATE:  06/09/2017 CONSULTATION DATE: June 09, 2017  REFERRING MD: Dr. Gloriann Loan  CHIEF COMPLAINT: Respiratory failure  HISTORY OF PRESENT ILLNESS:   This is a 82 year old female who presented with 2 days of flank pain in the setting of a ureteral calculus.  Because of ongoing pain and signs of systemic illness she was taken to the operating room today for urgent ureteral stent placement.  She had evidence of a urinary tract infection as well.  She was taken to the operating room and the procedure went uneventfully.  The upper port notes that the bladder was normal in appearance and the wire was passed into the ureter and the stone was removed and immediate large amount of thick purulent few fluid past.  A stent was placed over the wire.  A Foley catheter was placed.  She was sent to the PACU and initially was extubated but after extubation developed tachycardia and anxiety and increasing shortness of breath.  She required intubation for respiratory failure.  After intubation her heart rate improved.  She did have some low blood pressure readings.  Pulmonary and critical care medicine was consulted for further management.  PAST MEDICAL HISTORY :  She  has a past medical history of Allergic rhinitis, Chronic chest pain, Diverticulosis, Heart murmur, Hyperlipidemia, Hypertension, Osteoarthritis, Osteopenia, Pelvic prolapse, and PVC's (premature ventricular contractions).  PAST SURGICAL HISTORY: She  has a past surgical history that includes Carpal tunnel release (2002); Bunionectomy (08/2006); Total knee arthroplasty (Left, 08/2007); and Total knee arthroplasty (Right, 03/2010).  Allergies  Allergen Reactions  . Ciprofloxacin Hives  . Codeine Hives  . Demerol [Meperidine] Hives  . Other Hives    Hydrogen peroxide  . Ciprofloxacin Hcl   . Darvon [Propoxyphene]     Other reaction(s): OTHER  .  Hydrogen Peroxide     White blisters  . Tramadol     "ITCHY ON THE INSIDE"    No current facility-administered medications on file prior to encounter.    Current Outpatient Medications on File Prior to Encounter  Medication Sig  . Acetaminophen (ACETAMIN PO) Take 200 mg by mouth as directed. 2-6 Tablets daily----Arthritis  . amitriptyline (ELAVIL) 10 MG tablet Take 20 mg by mouth at bedtime. Uses for Chronic Pain  . amLODipine (NORVASC) 5 MG tablet Take 1 tablet (5 mg total) by mouth daily.  . Ascorbic Acid (VITAMIN C PO) Take 1 tablet by mouth daily.  Marland Kitchen atorvastatin (LIPITOR) 10 MG tablet Take 10 mg by mouth daily.  . cephALEXin (KEFLEX) 500 MG capsule Take 1 capsule (500 mg total) by mouth 4 (four) times daily.  . cholecalciferol (VITAMIN D) 1000 UNITS tablet Take 1,000 Units by mouth daily.  Marland Kitchen docusate sodium (COLACE) 100 MG capsule Take 300 mg by mouth daily.  Marland Kitchen FIBER PO Take 2 tablets by mouth daily.  . fluticasone (FLONASE) 50 MCG/ACT nasal spray Place 2 sprays into both nostrils daily.  Marland Kitchen gabapentin (NEURONTIN) 100 MG capsule Take 100 mg by mouth at bedtime.   Marland Kitchen MYRBETRIQ 50 MG TB24 tablet Take 50 mg by mouth daily.   . ondansetron (ZOFRAN ODT) 4 MG disintegrating tablet Take 1 tablet (4 mg total) by mouth every 8 (eight) hours as needed for nausea or vomiting.  Marland Kitchen oxyCODONE-acetaminophen (PERCOCET/ROXICET) 5-325 MG tablet Take 1-2 tablets by mouth every 4 (four) hours as needed for severe pain.    FAMILY HISTORY:  Her indicated that her mother is deceased. She indicated that her father is deceased. She indicated that her maternal grandmother is deceased. She indicated that her maternal grandfather is deceased. She indicated that her paternal grandmother is deceased. She indicated that her paternal grandfather is deceased. She indicated that the status of her cousin is unknown.   SOCIAL HISTORY: She  reports that she quit smoking about 24 years ago. Her smoking use included  cigarettes. She has a 25.00 pack-year smoking history. she has never used smokeless tobacco. She reports that she does not drink alcohol or use drugs.  REVIEW OF SYSTEMS:   Cannot obtain due to intubation  SUBJECTIVE:  As above  VITAL SIGNS: BP (!) 106/46 Comment: a-line pressure w/cuff reading of 96/61  Pulse (P) 96   Temp (!) 102.2 F (39 C)   Resp (!) 22   Ht 5\' 1"  (1.549 m)   Wt 75.6 kg (166 lb 9.6 oz)   SpO2 97%   BMI 31.48 kg/m   HEMODYNAMICS:    VENTILATOR SETTINGS: Vent Mode: PRVC FiO2 (%):  [100 %] 100 % Set Rate:  [15 bmp] 15 bmp Vt Set:  [380 mL] 380 mL PEEP:  [5 cmH20] 5 cmH20 Plateau Pressure:  [13 cmH20] 13 cmH20  INTAKE / OUTPUT: No intake/output data recorded.  PHYSICAL EXAMINATION:  General:  In bed on vent HENT: NCAT ETT in place PULM: CTA B, vent supported breathing CV: RRR, no mgr GI: BS+, soft, nontender MSK: normal bulk and tone Neuro: sedated on vent   LABS:  BMET Recent Labs  Lab 06/07/17 1540 06/09/17 1517  NA 138 137  K 4.6 4.2  CL 103 103  CO2 24 22  BUN 18 49*  CREATININE 1.09* 2.28*  GLUCOSE 137* 106*    Electrolytes Recent Labs  Lab 06/07/17 1540 06/09/17 1517  CALCIUM 9.8 8.7*    CBC Recent Labs  Lab 06/07/17 1540 06/09/17 1517  WBC 16.5* 3.9*  HGB 13.0 11.4*  HCT 40.4 36.5  PLT 296 178    Coag's No results for input(s): APTT, INR in the last 168 hours.  Sepsis Markers No results for input(s): LATICACIDVEN, PROCALCITON, O2SATVEN in the last 168 hours.  ABG No results for input(s): PHART, PCO2ART, PO2ART in the last 168 hours.  Liver Enzymes No results for input(s): AST, ALT, ALKPHOS, BILITOT, ALBUMIN in the last 168 hours.  Cardiac Enzymes No results for input(s): TROPONINI, PROBNP in the last 168 hours.  Glucose No results for input(s): GLUCAP in the last 168 hours.  Imaging X-ray Chest Pa Or Ap  Result Date: 06/09/2017 CLINICAL DATA:  Respiratory distress. Tachycardia. Sepsis.  Intubated. EXAM: CHEST 1 VIEW COMPARISON:  08/14/2014 FINDINGS: Endotracheal tube tip 2.5 cm above the carina. Pacemaker overlies the chest. Chronic lung disease with widespread interstitial markings. No sign of acute consolidation or collapse. No effusions. Bony structures unremarkable. IMPRESSION: Endotracheal tube well positioned. Diffuse chronic fibrotic lung markings. Electronically Signed   By: Nelson Chimes M.D.   On: 06/09/2017 15:50   Dg C-arm 1-60 Min-no Report  Result Date: 06/09/2017 Fluoroscopy was utilized by the requesting physician.  No radiographic interpretation.     STUDIES:    CULTURES: June 09, 2017 blood culture June 09, 2017 urine culture  ANTIBIOTICS: June 09, 2017 ceftriaxone  SIGNIFICANT EVENTS: June 09, 2017 admission after left ureteral stent placement  LINES/TUBES: June 09, 2017 endotracheal tube June 09, 2017 left radial arterial line  DISCUSSION: This is a 82 year old female with a  past medical history significant for aortic stenosis who presented with left nephrolithiasis and a urinary tract infection today, she underwent ureteral stent placement and during that time a significant amount of pus was noted in the left ureter.  She now has evidence of severe sepsis and dyspnea which is likely related to sepsis.  Of note, she has underlying aortic stenosis, severity is uncertain.  ASSESSMENT / PLAN:  PULMONARY A: Acute respiratory failure with hypoxemia P:   Full mechanical vent support VAP prevention Daily WUA/SBT   CARDIOVASCULAR A:  Severe sepsis, possibly septic shock but at this time appears volume deplete Sinus tachycardia Aortic stenosis P:  Telemetry monitoring Bolus 30 cc/kg saline now per sepsis protocol Follow lactate Follow pro-calcitonin Follow hemodynamics Continue Neo-Synephrine for now, goal mean arterial pressure greater than 65  RENAL A:   Left nephrolithiasis Pyelonephritis due to stone P:    Postoperative care per urology Monitor BMET and UOP Replace electrolytes as needed   GASTROINTESTINAL A:   No acute issues P:   Famotidine for stress ulcer prophylaxis  HEMATOLOGIC A:   Mild normocytic anemia without bleeding P:  Monitor for bleeding CBC in a.m.  INFECTIOUS A:   Severe sepsis with urinary tract infection/pyelonephritis, status post stone removal P:   Sepsis protocol Ceftriaxone Follow-up cultures  ENDOCRINE A:   No acute issues P:   Monitor glucose  NEUROLOGIC A:   Need for sedation after intubation P:   RASS goal: -1 PAD protocol with Precedex, intermittent fentanyl, intermittent Versed   FAMILY  - Updates:  None bedside  - Inter-disciplinary family meet or Palliative Care meeting due by:  day 7  My cc time 45 minutes  Roselie Awkward, MD [facility] Pager: [phone] Cell: [phone] After 3pm or if no response, call [phone]   06/09/2017, 4:57 PM

## 2017-06-09 NOTE — Interval H&P Note (Signed)
History and Physical Interval Note:  06/09/2017 1:09 PM  Jacqueline Burke  has presented today for surgery, with the diagnosis of left ureteral stone  The various methods of treatment have been discussed with the patient and family. After consideration of risks, benefits and other options for treatment, the patient has consented to  Procedure(s): CYSTOSCOPY WITH RETROGRADE PYELOGRAM/URETERAL STENT PLACEMENT (Left) as a surgical intervention .  The patient's history has been reviewed, patient examined, no change in status, stable for surgery.  I have reviewed the patient's chart and labs.  Questions were answered to the patient's satisfaction.     Marton Redwood, III

## 2017-06-09 NOTE — Op Note (Signed)
Operative Note  Preoperative diagnosis:  1.  Left ureteral calculus with possible urinary tract infection  Postoperative diagnosis: 1.  Left ureteral calculus with urinary tract infection  Procedure(s): 1.  Cystoscopy with left retrograde pyelogram and left ureteral stent placement, fluoroscopy less than 1 hour  Surgeon: Link Snuffer, MD  Assistants: None   Anesthesia: General  Complications: None immediate  EBL: Minimal  Specimens: 1.  None  Drains/Catheters: 1.  6 x 24 double-J ureteral stent on the left, 18 French Foley catheter  Intraoperative findings: 1.  Normal urethra and bladder 2.  Retrograde pyelogram on the left revealed filling defect at the mid to proximal ureter consistent with known stone.  After wire placement, there was immediate reflux of a large amount of purulent fluid  Indication: 82 year old female who presented 2 days ago with a left ureteral calculus presented to my office today.  She was somnolent and continued to have some pain.  She contributed her somnolence to the pain medication.  Her urine was dirty consistent with possible urinary tract infection.  Given these findings, I decided to proceed with an urgent ureteral stent placement given possible impending sepsis.  Description of procedure:  The patient was identified and consent was obtained.  The patient was taken to the operating room and placed in the supine position.  The patient was placed under general anesthesia.  Perioperative antibiotics were administered.  The patient was placed in dorsal lithotomy.  Patient was prepped and draped in a standard sterile fashion and a timeout was performed.  A 21 French rigid cystoscope was advanced into the urethra and into the bladder.  An open-ended ureteral catheter was advanced into the ureteral orifice on the left and a retrograde pyelogram was performed with the findings noted above.  A sensor wire was advanced up to the kidney under fluoroscopic  guidance.  There was immediate reflux of a large amount of thick purulent fluid.  I then advanced a 6 x 24 double-J ureteral stent over the wire and withdrew the wire.  Fluoroscopy confirmed proximal placement and direct visualization confirmed a good coil within the bladder.  I withdrew the scope and placed a Foley catheter to maximize decompression of her system given the large amount of purulent reflux.  Patient tolerated procedure well.  She did have some hypotension in the operating room.  She was transferred to PACU in stable condition.  Plan: Admit to the hospital and I plan to place her in stepdown given her high possibility of developing sepsis and the fact that she was hypotensive in the operating room.  I will have a low threshold for transfer to the ICU.  I will start broad-spectrum antibiotics with vancomycin and Zosyn and await cultures.

## 2017-06-09 NOTE — Anesthesia Procedure Notes (Signed)
Procedure Name: Intubation Date/Time: 06/09/2017 2:25 PM Performed by: Glory Buff, CRNA Pre-anesthesia Checklist: Patient identified, Emergency Drugs available, Suction available and Patient being monitored Patient Re-evaluated:Patient Re-evaluated prior to induction Oxygen Delivery Method: Circle system utilized Preoxygenation: Pre-oxygenation with 100% oxygen Induction Type: IV induction Ventilation: Mask ventilation without difficulty Laryngoscope Size: Miller and 3 Grade View: Grade I Tube type: Oral Tube size: 7.5 mm Number of attempts: 1 Airway Equipment and Method: Stylet and Oral airway Placement Confirmation: ETT inserted through vocal cords under direct vision,  positive ETCO2 and breath sounds checked- equal and bilateral Secured at: 20 cm Tube secured with: Tape Dental Injury: Teeth and Oropharynx as per pre-operative assessment

## 2017-06-09 NOTE — Progress Notes (Signed)
Pharmacy Antibiotic Note  Jacqueline Burke is a 82 y.o. female admitted on 06/09/2017 with left nephrolithiasis and a urinary tract infection today, she underwent ureteral stent placement. .  Pharmacy has been consulted for vancomycin dosing for bacteremia.  Plan: Vancomycin 1500mg  IV x 1 then 500mg  q36h for AUC 514.5 Zosyn per MD, adjust dose to 2.25mg  Q6h for CrCl < 56mls/min Daily Scr Follow cultures and clinical course  Height: 5\' 1"  (154.9 cm) Weight: 166 lb 9.6 oz (75.6 kg) IBW/kg (Calculated) : 47.8  Temp (24hrs), Avg:100.7 F (38.2 C), Min:98.6 F (37 C), Max:102.2 F (39 C)  Recent Labs  Lab 06/07/17 1540 06/09/17 1517  WBC 16.5* 3.9*  CREATININE 1.09* 2.28*    Estimated Creatinine Clearance: 18.6 mL/min (A) (by C-G formula based on SCr of 2.28 mg/dL (H)).    Allergies  Allergen Reactions  . Ciprofloxacin Hives  . Codeine Hives  . Demerol [Meperidine] Hives  . Other Hives    Hydrogen peroxide  . Ciprofloxacin Hcl   . Darvon [Propoxyphene]     Other reaction(s): OTHER  . Hydrogen Peroxide     White blisters  . Tramadol     "ITCHY ON THE INSIDE"    Antimicrobials this admission: 1/8 cefazolin x 1 1/8 vanc >> 1/8 zosyn >>  Dose adjustments this admission:   Microbiology results: 1/8 BCx: sent 1/8 UCx:  sent 1/8 MRSA PCR: sent  Thank you for allowing pharmacy to be a part of this patient's care.  Dolly Rias RPh 06/09/2017, 6:54 PM Pager [phone]

## 2017-06-09 NOTE — Progress Notes (Signed)
[facility] Progress Note Patient Name: Jacqueline Burke DOB: 05-31-1936 MRN: [id number]   Date of Service  06/09/2017  HPI/Events of Note  Multiple issues: 1. Patient is on a diet and is intubated and ventilated and 2. Can't put Sennakot down gastric tube.   eICU Interventions  Will order: 1. D/C diet order. 2. NPO. 3. D/C Senn[place]ot.      Intervention Category Major Interventions: Other:  Lysle Dingwall 06/09/2017, 11:49 PM

## 2017-06-09 NOTE — Anesthesia Procedure Notes (Signed)
Arterial Line Insertion Start/End1/12/2017 4:44 PM, 06/09/2017 4:51 PM Performed by: West Pugh, CRNA, CRNA  Patient location: PACU. Preanesthetic checklist: patient identified, IV checked, site marked, risks and benefits discussed, surgical consent, monitors and equipment checked, pre-op evaluation, timeout performed and anesthesia consent Left, radial was placed Catheter size: 20 G Hand hygiene performed  and maximum sterile barriers used  Allen's test indicative of satisfactory collateral circulation Attempts: 1 Procedure performed without using ultrasound guided technique. Following insertion, dressing applied. Post procedure assessment: normal and unchanged  Patient tolerated the procedure with difficulty.

## 2017-06-09 NOTE — Anesthesia Postprocedure Evaluation (Signed)
Anesthesia Post Note  Patient: Jacqueline Burke  Procedure(s) Performed: CYSTOSCOPY WITH RETROGRADE PYELOGRAM/URETERAL STENT PLACEMENT (Left )     Patient location during evaluation: PACU Anesthesia Type: General Level of consciousness: patient remains intubated per anesthesia plan Pain management: pain level controlled Vital Signs Assessment: vitals unstable Respiratory status: spontaneous breathing, patient re-intubated, patient remains intubated per anesthesia plan, patient on ventilator - see flowsheet for VS and respiratory function unstable Cardiovascular status: unstable Postop Assessment: no apparent nausea or vomiting Anesthetic complications: no Comments: Called to evaluate patient in PACU.  Heart rate approximately 180, Sinus.  Patient obviously struggling to breathe.  Patient re-intubated without incident.  Heart rate dropped to 90s after intubation.  BP beginning to drift down.  Started on Phenylephrine drip and arterial line started.  ICU consult.  Probable sepsis.    Last Vitals:  Vitals:   06/09/17 1639 06/09/17 1649  BP: (!) 90/51 (!) 106/46  Pulse:    Resp:    Temp:    SpO2:      Last Pain:  Vitals:   06/09/17 1610  TempSrc:   PainSc: [place]

## 2017-06-09 NOTE — Anesthesia Preprocedure Evaluation (Addendum)
Anesthesia Evaluation  Patient identified by MRN, date of birth, ID band Patient awake    Reviewed: Allergy & Precautions, NPO status , Patient's Chart, lab work & pertinent test results  Airway Mallampati: II  TM Distance: >3 FB Neck ROM: Full    Dental no notable dental hx.    Pulmonary neg pulmonary ROS, former smoker,    Pulmonary exam normal breath sounds clear to auscultation       Cardiovascular hypertension, negative cardio ROS Normal cardiovascular exam Rhythm:Regular Rate:Normal  2010 Echo: Nl LV, Valves ok   Neuro/Psych negative neurological ROS  negative psych ROS   GI/Hepatic negative GI ROS, Neg liver ROS,   Endo/Other  negative endocrine ROS  Renal/GU negative Renal ROS  negative genitourinary   Musculoskeletal negative musculoskeletal ROS (+)   Abdominal   Peds negative pediatric ROS (+)  Hematology negative hematology ROS (+)   Anesthesia Other Findings   Reproductive/Obstetrics negative OB ROS                             Anesthesia Physical Anesthesia Plan  ASA: II  Anesthesia Plan: General   Post-op Pain Management:    Induction: Intravenous  PONV Risk Score and Plan: 2 and Treatment may vary due to age or medical condition, Ondansetron and Dexamethasone  Airway Management Planned: Oral ETT  Additional Equipment:   Intra-op Plan:   Post-operative Plan:   Informed Consent: I have reviewed the patients History and Physical, chart, labs and discussed the procedure including the risks, benefits and alternatives for the proposed anesthesia with the patient or authorized representative who has indicated his/her understanding and acceptance.     Plan Discussed with: CRNA and Anesthesiologist  Anesthesia Plan Comments:         Anesthesia Quick Evaluation

## 2017-06-09 NOTE — Anesthesia Procedure Notes (Signed)
Procedure Name: Intubation Date/Time: 06/09/2017 3:45 PM Performed by: Glory Buff, CRNA Pre-anesthesia Checklist: Patient identified, Emergency Drugs available, Suction available and Patient being monitored Patient Re-evaluated:Patient Re-evaluated prior to induction Oxygen Delivery Method: Ambu bag Preoxygenation: Pre-oxygenation with 100% oxygen Induction Type: IV induction Laryngoscope Size: Miller and 3 Grade View: Grade I Tube size: 7.5 (Subglottic ETT) mm Number of attempts: 1 Airway Equipment and Method: Stylet Placement Confirmation: ETT inserted through vocal cords under direct vision Secured at: 21 cm Dental Injury: Teeth and Oropharynx as per pre-operative assessment

## 2017-06-09 NOTE — Transfer of Care (Signed)
Immediate Anesthesia Transfer of Care Note  Patient: Deneene S Begnaud  Procedure(s) Performed: CYSTOSCOPY WITH RETROGRADE PYELOGRAM/URETERAL STENT PLACEMENT (Left )  Patient Location: PACU  Anesthesia Type:General  Level of Consciousness: awake, alert  and oriented  Airway & Oxygen Therapy: Patient Spontanous Breathing and Patient connected to face mask oxygen  Post-op Assessment: Report given to RN and Post -op Vital signs reviewed and stable  Post vital signs: Reviewed and stable  Last Vitals:  Vitals:   06/09/17 1229 06/09/17 1455  BP:    Pulse:  90  Resp:  19  Temp:    SpO2: 95% 98%    Last Pain:  Vitals:   06/09/17 1455  TempSrc:   PainSc: 0-No pain         Complications: No apparent anesthesia complications

## 2017-06-09 NOTE — Addendum Note (Signed)
Addendum  created 06/09/17 1718 by West Pugh, CRNA   Charge Capture section accepted

## 2017-06-09 NOTE — H&P (Signed)
CC: I have kidney stones.  HPI: Jacqueline Burke is a 82 year-old female established patient who is here for renal calculi.  The problem is on the left side. She first stated noticing pain on 06/07/2017. This is her first kidney stone. She is currently having flank pain, back pain, nausea, and vomiting. She denies having groin pain, fever, and chills. She has not caught a stone in her urine strainer since her symptoms began.   She has never had surgical treatment for calculi in the past.   The patient is normally followed for urinary incontinence by Dr. Matilde Sprang. She is very severe left-sided flank pain prompting an emergency room visit which revealed on CT scan to have a 7 mm left ureteral calculus. She presents today and is somewhat somnolent. She is stable but has difficulty carrying on her thoughts. In the emergency department, she had some leukocytosis 16.5. Creatinine was 1.09. Urinalysis was suggestive of possible infection. He states he was started on Keflex.   ALLERGIES: Codeine - Itching, Hives Darvon Demerol - Hives, Itching Septra - Hives, Itching   MEDICATIONS: Lipitor  Myrbetriq 50 mg tablet, extended release 24 hr 1 tablet PO Daily  Oxybutynin Chloride 5 mg tablet 1 tablet PO BID  Amitriptyline Hcl 10 mg tablet  Atorvastatin Calcium 10 mg tablet  Cephalexin  Colace  Folic Acid  Gabapentin  Stool Softener  Vita-C  Vitamin D3    GU PSH: Cystoscopy - 10/10/2016   NON-GU PSH: Anesth, Bladder Surgery - 2011 Carpal tunnel surgery - 2003 Knee replacement, Bilateral, 2007, 2009 - 2009   GU PMH: Mixed incontinence - 10/10/2016 Nocturia - 10/10/2016 Urinary Frequency - 10/10/2016   NON-GU PMH: Arthritis Hypercholesterolemia Hypertension   FAMILY HISTORY: 1 Daughter - Other 1 son - Other father deceased at age 60 - Other mother deceased at age 32 - Other    Notes: Mother died from a stroke  Father died from Heart Attack   SOCIAL HISTORY: Marital Status: Married Current  Smoking Status: Patient does not smoke anymore. Has not smoked since 10/01/2006. Smoked for 22 years. Smoked 1 pack per day.   Tobacco Use Assessment Completed:  Used Tobacco in last 30 days?  Has never drank.    REVIEW OF SYSTEMS:    GU Review Female:   Patient reports frequent urination, hard to postpone urination, get up at night to urinate, and leakage of urine. Patient denies burning /pain with urination, stream starts and stops, trouble starting your stream, have to strain to urinate, and being pregnant.  Gastrointestinal (Upper):   Patient denies nausea, vomiting, and indigestion/ heartburn.  Gastrointestinal (Lower):   Patient denies diarrhea and constipation.  Constitutional:   Patient denies fever, night sweats, weight loss, and fatigue.  Skin:   Patient denies skin rash/ lesion and itching.  Eyes:   Patient denies double vision and blurred vision.  Ears/ Nose/ Throat:   Patient denies sore throat and sinus problems.  Hematologic/Lymphatic:   Patient denies swollen glands and easy bruising.  Cardiovascular:   Patient denies leg swelling and chest pains.  Respiratory:   Patient denies cough and shortness of breath.  Endocrine:   Patient denies excessive thirst.  Musculoskeletal:   Patient denies back pain and joint pain.  Neurological:   Patient denies headaches and dizziness.  Psychologic:   Patient denies depression and anxiety.   VITAL SIGNS:      06/09/2017 09:40 AM  BP 89/56 mmHg  Heart Rate 80 /min  Temperature 97.9 F /  36.6 C   MULTI-SYSTEM PHYSICAL EXAMINATION:    Constitutional: Patient appears somewhat somnolent. Difficulty carrying on conversations.  Respiratory: No labored breathing, no use of accessory muscles.   Cardiovascular: Normal temperature, adequate perfusion of extremities  Skin: No paleness, no jaundice  Neurologic / Psychiatric: Oriented to time, oriented to place, oriented to person. No depression, no anxiety, no agitation.  Gastrointestinal: No  mass, no tenderness, no rigidity, non obese abdomen.  Eyes: Normal conjunctivae. Normal eyelids.  Musculoskeletal: Normal gait and station of head and neck.    PAST DATA REVIEWED:  Source Of History:  Patient  Records Review:   Previous Doctor Records  X-Ray Review: C.T. Abdomen/Pelvis: Reviewed Films. Reviewed Report. Discussed With Patient.    PROCEDURES:         Urinalysis w/Scope Dipstick Dipstick Cont'd Micro  Color: Yellow Bilirubin: Neg WBC/hpf: 20 - 40/hpf  Appearance: Cloudy Ketones: Trace RBC/hpf: NS (Not Seen)  Specific Gravity: 1.025 Blood: 1+ Bacteria: Few (10-25/hpf)  pH: <=5.0 Protein: 1+ Cystals: NS (Not Seen)  Glucose: Neg Urobilinogen: 4.0 Casts: NS (Not Seen)    Nitrites: Neg Trichomonas: Not Present    Leukocyte Esterase: 1+ Mucous: Not Present      Epithelial Cells: 6 - 10/hpf      Yeast: NS (Not Seen)      Sperm: Not Present   ASSESSMENT:      ICD-10 Details  1 GU:   Ureteral calculus - N20.1    PLAN:          Document Letter(s):  Created for Patient: Clinical Summary        Notes:   Patient does not appear well in the office today. Given the left ureteral stone with associated possible urinary tract infection as well as her being somnolent, a plan proceed with left ureteral stent placement urgently.   Signed by Link Snuffer, III, M.D. on 06/09/17 at 10:43 AM (EST)

## 2017-06-10 ENCOUNTER — Observation Stay (HOSPITAL_BASED_OUTPATIENT_CLINIC_OR_DEPARTMENT_OTHER): Payer: PPO

## 2017-06-10 ENCOUNTER — Encounter (HOSPITAL_COMMUNITY): Payer: Self-pay | Admitting: Urology

## 2017-06-10 ENCOUNTER — Observation Stay (HOSPITAL_COMMUNITY): Payer: PPO

## 2017-06-10 DIAGNOSIS — Z955 Presence of coronary angioplasty implant and graft: Secondary | ICD-10-CM | POA: Diagnosis not present

## 2017-06-10 DIAGNOSIS — J95821 Acute postprocedural respiratory failure: Secondary | ICD-10-CM | POA: Diagnosis not present

## 2017-06-10 DIAGNOSIS — R32 Unspecified urinary incontinence: Secondary | ICD-10-CM | POA: Diagnosis present

## 2017-06-10 DIAGNOSIS — R0600 Dyspnea, unspecified: Secondary | ICD-10-CM

## 2017-06-10 DIAGNOSIS — N12 Tubulo-interstitial nephritis, not specified as acute or chronic: Secondary | ICD-10-CM | POA: Diagnosis not present

## 2017-06-10 DIAGNOSIS — E7800 Pure hypercholesterolemia, unspecified: Secondary | ICD-10-CM | POA: Diagnosis present

## 2017-06-10 DIAGNOSIS — D649 Anemia, unspecified: Secondary | ICD-10-CM | POA: Diagnosis present

## 2017-06-10 DIAGNOSIS — Z823 Family history of stroke: Secondary | ICD-10-CM | POA: Diagnosis not present

## 2017-06-10 DIAGNOSIS — N179 Acute kidney failure, unspecified: Secondary | ICD-10-CM | POA: Diagnosis present

## 2017-06-10 DIAGNOSIS — A419 Sepsis, unspecified organism: Secondary | ICD-10-CM | POA: Diagnosis present

## 2017-06-10 DIAGNOSIS — I10 Essential (primary) hypertension: Secondary | ICD-10-CM | POA: Diagnosis present

## 2017-06-10 DIAGNOSIS — Z881 Allergy status to other antibiotic agents status: Secondary | ICD-10-CM | POA: Diagnosis not present

## 2017-06-10 DIAGNOSIS — B9620 Unspecified Escherichia coli [E. coli] as the cause of diseases classified elsewhere: Secondary | ICD-10-CM | POA: Diagnosis present

## 2017-06-10 DIAGNOSIS — R6520 Severe sepsis without septic shock: Secondary | ICD-10-CM | POA: Diagnosis present

## 2017-06-10 DIAGNOSIS — I214 Non-ST elevation (NSTEMI) myocardial infarction: Secondary | ICD-10-CM

## 2017-06-10 DIAGNOSIS — N136 Pyonephrosis: Secondary | ICD-10-CM | POA: Diagnosis present

## 2017-06-10 DIAGNOSIS — I472 Ventricular tachycardia: Secondary | ICD-10-CM | POA: Diagnosis not present

## 2017-06-10 DIAGNOSIS — R079 Chest pain, unspecified: Secondary | ICD-10-CM

## 2017-06-10 DIAGNOSIS — J9690 Respiratory failure, unspecified, unspecified whether with hypoxia or hypercapnia: Secondary | ICD-10-CM | POA: Diagnosis not present

## 2017-06-10 DIAGNOSIS — R0603 Acute respiratory distress: Secondary | ICD-10-CM | POA: Diagnosis not present

## 2017-06-10 DIAGNOSIS — R7881 Bacteremia: Secondary | ICD-10-CM | POA: Diagnosis not present

## 2017-06-10 DIAGNOSIS — I9788 Other intraoperative complications of the circulatory system, not elsewhere classified: Secondary | ICD-10-CM | POA: Diagnosis not present

## 2017-06-10 DIAGNOSIS — Z87891 Personal history of nicotine dependence: Secondary | ICD-10-CM | POA: Diagnosis not present

## 2017-06-10 DIAGNOSIS — G8929 Other chronic pain: Secondary | ICD-10-CM | POA: Diagnosis present

## 2017-06-10 DIAGNOSIS — M1990 Unspecified osteoarthritis, unspecified site: Secondary | ICD-10-CM | POA: Diagnosis present

## 2017-06-10 DIAGNOSIS — J9601 Acute respiratory failure with hypoxia: Secondary | ICD-10-CM | POA: Diagnosis not present

## 2017-06-10 DIAGNOSIS — N201 Calculus of ureter: Secondary | ICD-10-CM | POA: Diagnosis not present

## 2017-06-10 DIAGNOSIS — I493 Ventricular premature depolarization: Secondary | ICD-10-CM | POA: Diagnosis present

## 2017-06-10 DIAGNOSIS — Z885 Allergy status to narcotic agent status: Secondary | ICD-10-CM | POA: Diagnosis not present

## 2017-06-10 DIAGNOSIS — Z79899 Other long term (current) drug therapy: Secondary | ICD-10-CM | POA: Diagnosis not present

## 2017-06-10 DIAGNOSIS — Z96653 Presence of artificial knee joint, bilateral: Secondary | ICD-10-CM | POA: Diagnosis present

## 2017-06-10 DIAGNOSIS — Z8249 Family history of ischemic heart disease and other diseases of the circulatory system: Secondary | ICD-10-CM | POA: Diagnosis not present

## 2017-06-10 DIAGNOSIS — E876 Hypokalemia: Secondary | ICD-10-CM | POA: Diagnosis present

## 2017-06-10 LAB — BLOOD CULTURE ID PANEL (REFLEXED)
ACINETOBACTER BAUMANNII: NOT DETECTED
CANDIDA ALBICANS: NOT DETECTED
CANDIDA KRUSEI: NOT DETECTED
CANDIDA PARAPSILOSIS: NOT DETECTED
Candida glabrata: NOT DETECTED
Candida tropicalis: NOT DETECTED
Carbapenem resistance: NOT DETECTED
ENTEROBACTER CLOACAE COMPLEX: NOT DETECTED
ENTEROBACTERIACEAE SPECIES: DETECTED — AB
ESCHERICHIA COLI: DETECTED — AB
Enterococcus species: NOT DETECTED
Haemophilus influenzae: NOT DETECTED
KLEBSIELLA OXYTOCA: NOT DETECTED
KLEBSIELLA PNEUMONIAE: NOT DETECTED
Listeria monocytogenes: NOT DETECTED
Neisseria meningitidis: NOT DETECTED
PSEUDOMONAS AERUGINOSA: NOT DETECTED
Proteus species: NOT DETECTED
STREPTOCOCCUS PYOGENES: NOT DETECTED
STREPTOCOCCUS SPECIES: NOT DETECTED
Serratia marcescens: NOT DETECTED
Staphylococcus aureus (BCID): NOT DETECTED
Staphylococcus species: NOT DETECTED
Streptococcus agalactiae: NOT DETECTED
Streptococcus pneumoniae: NOT DETECTED

## 2017-06-10 LAB — CBC
HEMATOCRIT: 30.1 % — AB (ref 36.0–46.0)
Hemoglobin: 9.9 g/dL — ABNORMAL LOW (ref 12.0–15.0)
MCH: 30.5 pg (ref 26.0–34.0)
MCHC: 32.9 g/dL (ref 30.0–36.0)
MCV: 92.6 fL (ref 78.0–100.0)
PLATELETS: 147 10*3/uL — AB (ref 150–400)
RBC: 3.25 MIL/uL — AB (ref 3.87–5.11)
RDW: 14.6 % (ref 11.5–15.5)
WBC: 17.5 10*3/uL — ABNORMAL HIGH (ref 4.0–10.5)

## 2017-06-10 LAB — ECHOCARDIOGRAM COMPLETE
HEIGHTINCHES: 61 in
WEIGHTICAEL: 2665.6 [oz_av]

## 2017-06-10 LAB — URINE CULTURE: Culture: 20000 — AB

## 2017-06-10 LAB — BASIC METABOLIC PANEL
Anion gap: 8 (ref 5–15)
BUN: 41 mg/dL — AB (ref 6–20)
CHLORIDE: 108 mmol/L (ref 101–111)
CO2: 22 mmol/L (ref 22–32)
CREATININE: 1.69 mg/dL — AB (ref 0.44–1.00)
Calcium: 8.1 mg/dL — ABNORMAL LOW (ref 8.9–10.3)
GFR calc Af Amer: 32 mL/min — ABNORMAL LOW (ref >60)
GFR calc non Af Amer: 28 mL/min — ABNORMAL LOW (ref >60)
Glucose, Bld: 134 mg/dL — ABNORMAL HIGH (ref 65–99)
Potassium: 4.1 mmol/L (ref 3.5–5.1)
Sodium: 138 mmol/L (ref 135–145)

## 2017-06-10 LAB — ABO/RH: ABO/RH(D): O POS

## 2017-06-10 LAB — TROPONIN I
Troponin I: 9.77 ng/mL (ref <0.03)
Troponin I: 7.57 ng/mL (ref <0.03)
Troponin I: 6.35 ng/mL (ref <0.03)

## 2017-06-10 LAB — HEPARIN LEVEL (UNFRACTIONATED): Heparin Unfractionated: <0.1 IU/mL — ABNORMAL LOW (ref 0.30–0.70)

## 2017-06-10 MED ORDER — HEPARIN BOLUS VIA INFUSION
2000.0000 [IU] | Freq: Once | INTRAVENOUS | Status: AC
  Administered 2017-06-10: 2000 [IU] via INTRAVENOUS
  Filled 2017-06-10: qty 2000

## 2017-06-10 MED ORDER — HEPARIN (PORCINE) IN NACL 100-0.45 UNIT/ML-% IJ SOLN
1150.0000 [IU]/h | INTRAMUSCULAR | Status: DC
  Filled 2017-06-10: qty 250

## 2017-06-10 MED ORDER — CHLORHEXIDINE GLUCONATE 0.12 % MT SOLN
15.0000 mL | Freq: Two times a day (BID) | OROMUCOSAL | Status: DC
  Administered 2017-06-10 – 2017-06-11 (×3): 15 mL via OROMUCOSAL
  Filled 2017-06-10 (×3): qty 15

## 2017-06-10 MED ORDER — AMITRIPTYLINE HCL 10 MG PO TABS
20.0000 mg | ORAL_TABLET | Freq: Every day | ORAL | Status: DC
  Administered 2017-06-11 – 2017-06-15 (×5): 20 mg via ORAL
  Filled 2017-06-10 (×6): qty 2

## 2017-06-10 MED ORDER — METOPROLOL TARTRATE 12.5 MG HALF TABLET
12.5000 mg | ORAL_TABLET | Freq: Two times a day (BID) | ORAL | Status: DC
  Administered 2017-06-10 – 2017-06-11 (×3): 12.5 mg via ORAL
  Filled 2017-06-10 (×3): qty 1

## 2017-06-10 MED ORDER — GABAPENTIN 100 MG PO CAPS
100.0000 mg | ORAL_CAPSULE | Freq: Every day | ORAL | Status: DC
  Administered 2017-06-11 – 2017-06-15 (×5): 100 mg via ORAL
  Filled 2017-06-10 (×5): qty 1

## 2017-06-10 MED ORDER — HEPARIN (PORCINE) IN NACL 100-0.45 UNIT/ML-% IJ SOLN
750.0000 [IU]/h | INTRAMUSCULAR | Status: DC
  Administered 2017-06-10: 750 [IU]/h via INTRAVENOUS
  Filled 2017-06-10: qty 250

## 2017-06-10 MED ORDER — DEXTROSE 5 % IV SOLN
2.0000 g | INTRAVENOUS | Status: DC
  Administered 2017-06-10 – 2017-06-11 (×2): 2 g via INTRAVENOUS
  Filled 2017-06-10 (×3): qty 2

## 2017-06-10 MED ORDER — AMLODIPINE BESYLATE 5 MG PO TABS
5.0000 mg | ORAL_TABLET | Freq: Every day | ORAL | Status: DC
  Administered 2017-06-10 – 2017-06-16 (×7): 5 mg via ORAL
  Filled 2017-06-10 (×7): qty 1

## 2017-06-10 MED ORDER — ASPIRIN EC 81 MG PO TBEC
81.0000 mg | DELAYED_RELEASE_TABLET | Freq: Every day | ORAL | Status: DC
  Administered 2017-06-11 – 2017-06-14 (×4): 81 mg via ORAL
  Filled 2017-06-10 (×4): qty 1

## 2017-06-10 MED ORDER — BACITRACIN-NEOMYCIN-POLYMYXIN 400-5-5000 EX OINT
1.0000 "application " | TOPICAL_OINTMENT | Freq: Three times a day (TID) | CUTANEOUS | Status: DC | PRN
  Filled 2017-06-10 (×2): qty 1

## 2017-06-10 MED ORDER — ATORVASTATIN CALCIUM 80 MG PO TABS
80.0000 mg | ORAL_TABLET | Freq: Every day | ORAL | Status: DC
  Administered 2017-06-10 – 2017-06-14 (×5): 80 mg via ORAL
  Filled 2017-06-10 (×5): qty 2

## 2017-06-10 MED ORDER — FENTANYL CITRATE (PF) 100 MCG/2ML IJ SOLN: 12.5000 ug | INTRAMUSCULAR | Status: DC | PRN

## 2017-06-10 MED ORDER — ASPIRIN 81 MG PO CHEW
CHEWABLE_TABLET | ORAL | Status: AC
  Administered 2017-06-10: 81 mg
  Filled 2017-06-10: qty 1

## 2017-06-10 MED ORDER — PIPERACILLIN-TAZOBACTAM 3.375 G IVPB: 3.3750 g | Freq: Three times a day (TID) | INTRAVENOUS | Status: DC

## 2017-06-10 MED ORDER — ORAL CARE MOUTH RINSE
15.0000 mL | Freq: Two times a day (BID) | OROMUCOSAL | Status: DC
  Administered 2017-06-11 (×2): 15 mL via OROMUCOSAL

## 2017-06-10 NOTE — Progress Notes (Signed)
PHARMACY NOTE:  ANTIMICROBIAL RENAL DOSAGE ADJUSTMENT  Current antimicrobial regimen includes a mismatch between antimicrobial dosage and estimated renal function.  As per policy approved by the Pharmacy & Therapeutics and Medical Executive Committees, the antimicrobial dosage will be adjusted accordingly.  Current antimicrobial dosage:  zosyn  Indication: sepsis  Renal Function:  Estimated Creatinine Clearance: 25.1 mL/min (A) (by C-G formula based on SCr of 1.69 mg/dL (H)). []      On intermittent HD, scheduled: []      On CRRT    Antimicrobial dosage has been changed to:  Zosyn to 3.375gm IV q8h over 4h infusion  Additional comments:   Thank you for allowing pharmacy to be a part of this patient's care.  Doreene Eland, PharmD, BCPS.   Pager: [phone] 06/10/2017 10:43 AM

## 2017-06-10 NOTE — Progress Notes (Signed)
Urology Inpatient Progress Report  left ureteral stone  Procedure(s): CYSTOSCOPY WITH RETROGRADE PYELOGRAM/URETERAL STENT PLACEMENT  1 Day Post-Op   Intv/Subj: Patient extubated. No complaints. Febrile and hypotensive o/n but clinically improving.  Troponins elevated. Heparin started, Cardiology consulted.   Active Problems:   Ureteral calculus   Chronic chest pain  Current Facility-Administered Medications  Medication Dose Route Frequency Provider Last Rate Last Dose  . 0.9 %  sodium chloride infusion   Intravenous Continuous Marton Redwood III, MD 150 mL/hr at 06/10/17 1026    . acetaminophen (TYLENOL) tablet 650 mg  650 mg Oral Q4H PRN Marton Redwood III, MD      . aspirin EC tablet 81 mg  81 mg Oral Daily Simonne Maffucci B, MD      . chlorhexidine (PERIDEX) 0.12 % solution 15 mL  15 mL Mouth Rinse BID Simonne Maffucci B, MD      . chlorhexidine gluconate (MEDLINE KIT) (PERIDEX) 0.12 % solution 15 mL  15 mL Mouth Rinse BID Marton Redwood III, MD   15 mL at 06/10/17 0732  . diphenhydrAMINE (BENADRYL) injection 12.5 mg  12.5 mg Intravenous Q6H PRN Marton Redwood III, MD       Or  . diphenhydrAMINE (BENADRYL) 12.5 MG/5ML elixir 12.5 mg  12.5 mg Oral Q6H PRN Marton Redwood III, MD      . docusate sodium (COLACE) capsule 100 mg  100 mg Oral BID Marton Redwood III, MD   100 mg at 06/10/17 0054  . fentaNYL (SUBLIMAZE) injection 12.5-25 mcg  12.5-25 mcg Intravenous Q2H PRN Simonne Maffucci B, MD      . heparin ADULT infusion 100 units/mL (25000 units/218m sodium chloride 0.45%)  750 Units/hr Intravenous Continuous ZBerton Mount RPH 7.5 mL/hr at 06/10/17 1011 750 Units/hr at 06/10/17 1011  . HYDROcodone-acetaminophen (NORCO/VICODIN) 5-325 MG per tablet 1-2 tablet  1-2 tablet Oral Q4H PRN BMarton RedwoodIII, MD      . MEDLINE mouth rinse  15 mL Mouth Rinse QID BMarton RedwoodIII, MD   15 mL at 06/10/17 0512  . MEDLINE mouth rinse  15 mL Mouth Rinse q12n4p McQuaid, Douglas B, MD       . morphine 4 MG/ML injection 2-4 mg  2-4 mg Intravenous Q2H PRN BMarton RedwoodIII, MD      . ondansetron (ZOFRAN) injection 4 mg  4 mg Intravenous Q4H PRN BMarton RedwoodIII, MD      . opium-belladonna (B&O SUPPRETTES) 16.2-60 MG suppository 1 suppository  1 suppository Rectal Q6H PRN BMarton RedwoodIII, MD      . oxybutynin (DITROPAN) tablet 5 mg  5 mg Oral Q8H PRN BMarton RedwoodIII, MD      . phenylephrine (NEOSYNEPHRINE) 10-0.9 MG/250ML-% infusion  20-200 mcg/min Intravenous Titrated MJuanito Doom MD   Stopped at 06/09/17 1900  . piperacillin-tazobactam (ZOSYN) IVPB 3.375 g  3.375 g Intravenous Q8H Zeigler, Dustin G, RPH      . sodium chloride 0.9 % bolus 1,000 mL  1,000 mL Intravenous Once MSimonne MaffucciB, MD       And  . sodium chloride 0.9 % bolus 1,000 mL  1,000 mL Intravenous Once MJuanito Doom MD      . [Derrill MemoON 06/11/2017] vancomycin (VANCOCIN) 500 mg in sodium chloride 0.9 % 100 mL IVPB  500 mg Intravenous Q36H BLucas Mallow MD         Objective: Vital: Vitals:  06/10/17 0800 06/10/17 0900 06/10/17 0930 06/10/17 1000  BP: (!) 150/78 (!) 158/77  (!) 126/102  Pulse:      Resp: '16 19  16  ' Temp:      TempSrc:      SpO2: 99% 98% 98% 99%  Weight:      Height:       I/Os: I/O last 3 completed shifts: In: 8588 [I.V.:2650; NG/GT:30; IV Piggyback:750] Out: 1400 [Urine:1150; Emesis/NG output:250]  Physical Exam:  General: Patient is in no apparent distress Lungs: Normal respiratory effort, chest expands symmetrically. GI: The abdomen is soft and nontender without mass. Foley: draining clear yellow urine Ext: lower extremities symmetric   Lab Results: Recent Labs    06/07/17 1540 06/09/17 1517 06/09/17 1656 06/10/17 0515  WBC 16.5* 3.9*  --  17.5*  HGB 13.0 11.4* 10.2* 9.9*  HCT 40.4 36.5 30.0* 30.1*   Recent Labs    06/07/17 1540 06/09/17 1517 06/09/17 1656 06/10/17 0515  NA 138 137 135 138  K 4.6 4.2 3.9 4.1  CL 103 103  --  108   CO2 24 22  --  22  GLUCOSE 137* 106*  --  134*  BUN 18 49*  --  41*  CREATININE 1.09* 2.28*  --  1.69*  CALCIUM 9.8 8.7*  --  8.1*   Recent Labs    06/09/17 1806  INR 1.15   No results for input(s): LABURIN in the last 72 hours. Results for orders placed or performed during the hospital encounter of 06/09/17  Culture, blood (x 2)     Status: None (Preliminary result)   Collection Time: 06/09/17  6:29 PM  Result Value Ref Range Status   Specimen Description BLOOD RIGHT HAND  Final   Special Requests IN PEDIATRIC BOTTLE Blood Culture adequate volume  Final   Culture  Setup Time   Final    GRAM NEGATIVE RODS IN PEDIATRIC BOTTLE Organism ID to follow Performed at [facility], [place] [address], [place], [place] [address]    Culture GRAM NEGATIVE RODS  Final   Report Status PENDING  Incomplete  MRSA PCR Screening     Status: None   Collection Time: 06/09/17  9:24 PM  Result Value Ref Range Status   MRSA by PCR NEGATIVE NEGATIVE Final    Comment:        The GeneXpert MRSA Assay (FDA approved for NASAL specimens only), is one component of a comprehensive MRSA colonization surveillance program. It is not intended to diagnose MRSA infection nor to guide or monitor treatment for MRSA infections.     Studies/Results: X-ray Chest Pa Or Ap  Result Date: 06/09/2017 CLINICAL DATA:  Respiratory distress. Tachycardia. Sepsis. Intubated. EXAM: CHEST 1 VIEW COMPARISON:  08/14/2014 FINDINGS: Endotracheal tube tip 2.5 cm above the carina. Pacemaker overlies the chest. Chronic lung disease with widespread interstitial markings. No sign of acute consolidation or collapse. No effusions. Bony structures unremarkable. IMPRESSION: Endotracheal tube well positioned. Diffuse chronic fibrotic lung markings. Electronically Signed   By: Nelson Chimes M.D.   On: 06/09/2017 15:50   Dg Abd 1 View  Result Date: 06/09/2017 CLINICAL DATA:  Encounter for orogastric tube EXAM: ABDOMEN - 1 VIEW  COMPARISON:  Abdominal CT 06/07/2017 FINDINGS: An orogastric tube is coiled in the fundus with tip at the antrum. Left ureteral stent with visualized portion in expected location. Left nephrolithiasis measuring 1 cm. Symmetric lung aeration with nonspecific bilateral reticulation. IMPRESSION: Orogastric tube with tip at the distal stomach.  Electronically Signed   By: Monte Fantasia M.D.   On: 06/09/2017 20:10   Portable Chest Xray  Result Date: 06/10/2017 CLINICAL DATA:  Respiratory failure. EXAM: PORTABLE CHEST 1 VIEW COMPARISON:  06/19/2017. FINDINGS: Low lung volumes. Unchanged ETT 2.6 cm above carina. Cardiac enlargement. Mild vascular congestion. No pneumothorax. IMPRESSION: Similar appearance to priors. Mild vascular congestion. Stable ETT. Electronically Signed   By: Staci Righter M.D.   On: 06/10/2017 07:12   Dg C-arm 1-60 Min-no Report  Result Date: 06/09/2017 Fluoroscopy was utilized by the requesting physician.  No radiographic interpretation.    Assessment: sepsis 2/2 to UTI and obstructing left ureteral calculus   Procedure(s): CYSTOSCOPY WITH RETROGRADE PYELOGRAM/URETERAL STENT PLACEMENT, 1 Day Post-Op  doing well.  Plan: Continue abx until cultures speciate. Continue foley catheter and can have void trial in the morning as long as she is clinically improving. She will need eventual left ureteroscopy, laser lithotripsy, ureteral stent placement as an outpatient once clinically improved, cardiac issues resolved, and infection completely treated. Rest per ICU. Appreciate the assistance with her care. I will discuss with hospitalists service about taking over as primary team given no urological intervention needed.   Link Snuffer, MD Urology 06/10/2017, 11:02 AM

## 2017-06-10 NOTE — Procedures (Signed)
Extubation Procedure Note  Patient Details:   Name: Jacqueline Burke DOB: 10-28-1935 MRN: [id number]   Airway Documentation:     Evaluation  O2 sats: stable throughout Complications: No apparent complications Patient did tolerate procedure well. Bilateral Breath Sounds: Clear   Yes  Tamera Reason 06/10/2017, 9:30 AM

## 2017-06-10 NOTE — Progress Notes (Signed)
PHARMACY - PHYSICIAN COMMUNICATION CRITICAL VALUE ALERT - BLOOD CULTURE IDENTIFICATION (BCID)  Jacqueline Burke is an 82 y.o. female who presented to [facility] on 06/09/2017 with a chief complaint of flank pain  Assessment:  L ureteral stone s/p cysto with stent placement 1/8.  Patient developed sepsis.  GNR 1/2 BCx with BCID identifying as e. Ecoli.  Recent E. Coli in UCx revealed resistance only to amp, amp/sulb and cefazolin.   Name of physician (or Provider) Contacted: Dr Lake Bells  Current antibiotics: vancomycin and zosyn   Changes to prescribed antibiotics recommended:  Recommendations accepted by provider  Results for orders placed or performed during the hospital encounter of 06/09/17  Blood Culture ID Panel (Reflexed) (Collected: 06/09/2017  6:29 PM)  Result Value Ref Range   Enterococcus species NOT DETECTED NOT DETECTED   Listeria monocytogenes NOT DETECTED NOT DETECTED   Staphylococcus species NOT DETECTED NOT DETECTED   Staphylococcus aureus NOT DETECTED NOT DETECTED   Streptococcus species NOT DETECTED NOT DETECTED   Streptococcus agalactiae NOT DETECTED NOT DETECTED   Streptococcus pneumoniae NOT DETECTED NOT DETECTED   Streptococcus pyogenes NOT DETECTED NOT DETECTED   Acinetobacter baumannii NOT DETECTED NOT DETECTED   Enterobacteriaceae species DETECTED (A) NOT DETECTED   Enterobacter cloacae complex NOT DETECTED NOT DETECTED   Escherichia coli DETECTED (A) NOT DETECTED   Klebsiella oxytoca NOT DETECTED NOT DETECTED   Klebsiella pneumoniae NOT DETECTED NOT DETECTED   Proteus species NOT DETECTED NOT DETECTED   Serratia marcescens NOT DETECTED NOT DETECTED   Carbapenem resistance NOT DETECTED NOT DETECTED   Haemophilus influenzae NOT DETECTED NOT DETECTED   Neisseria meningitidis NOT DETECTED NOT DETECTED   Pseudomonas aeruginosa NOT DETECTED NOT DETECTED   Candida albicans NOT DETECTED NOT DETECTED   Candida glabrata NOT DETECTED NOT DETECTED   Candida krusei NOT  DETECTED NOT DETECTED   Candida parapsilosis NOT DETECTED NOT DETECTED   Candida tropicalis NOT DETECTED NOT DETECTED   Doreene Eland, PharmD, BCPS.   Pager: [phone] 06/10/2017 11:56 AM

## 2017-06-10 NOTE — Consult Note (Signed)
Cardiology Consultation:   Patient ID: GIZELLE WHETSEL; [id number]; [birth year]   Admit date: 06/09/2017 Date of Consult: 06/10/2017  Primary Care Provider: Darcus Austin, MD Primary Cardiologist: Fransico Him, MD  Primary Electrophysiologist:  NA   Patient Profile:   Jacqueline Burke is a 82 y.o. female with a hx of HTN, PVCs and chronic non cardiac chest pain with normal stress test  who is being seen today for the evaluation of elevated troponin with sepsis at the request of Dr. Gloriann Loan.  History of Present Illness:   Jacqueline Burke has a hx of HTN, PVCs and chronic non cardiac chest pain with normal stress test.  PVCs have been asymptomatic.  Last echo 2010 with EF 55-60% and normal.    Pt now admitted 06/09/17 after Lt uteral stone and UTI she had placement of Lt ureteral stent placed-urgently.  Once stone removed thick purulent flluid went past.  In PACU after extubation she developed tachycardia and increased SOB.  Was re-intubated with this her HR improved.   DX of acute respiratory failure wit hypoxemia and sepsis.   EKG with new T wave inversions deep in II, III, AVF Q wave in III  Troponin 19.37 and today 9.77  procalcitonin 41.45  WBC 17.5 today, hgb 9.9 plts down from 178 to 147  Cr 1.69, down from 2.28 yesterday-- Cr was 1.09 on 06/07/16 and in 2015 was 0.89  Today she has been extubated at 0930 today and is alert and oriented.  Denies any chest pain.  No SOB has been under stress with family illness in [place] - she had been traveling from [place] when she had kidney stone pain.    IV heparin has been started.    Past Medical History:  Diagnosis Date  . Allergic rhinitis   . Chronic chest pain    and DOE w normal Stress cardiolyte  . Diverticulosis   . Heart murmur    Systolic heart murmur with Aortic valve sclerosis by ECHO  . Hyperlipidemia   . Hypertension   . Osteoarthritis    Erosive OA-MRI of R hand negative for synovitis, only OA-Dr Trudie Reed  . Osteopenia   . Pelvic prolapse   .  PVC's (premature ventricular contractions)     Past Surgical History:  Procedure Laterality Date  . BUNIONECTOMY  08/2006   R foot and hammer roe right second toe  . CARPAL TUNNEL RELEASE  2002  . CYSTOSCOPY W/ URETERAL STENT PLACEMENT Left 06/09/2017   Procedure: CYSTOSCOPY WITH RETROGRADE PYELOGRAM/URETERAL STENT PLACEMENT;  Surgeon: Lucas Mallow, MD;  Location: [facility];  Service: Urology;  Laterality: Left;  . TOTAL KNEE ARTHROPLASTY Left 08/2007  . TOTAL KNEE ARTHROPLASTY Right 03/2010     Home Medications:  Prior to Admission medications   Medication Sig Start Date End Date Taking? Authorizing Provider  Acetaminophen (ACETAMIN PO) Take 200 mg by mouth as directed. 2-6 Tablets daily----Arthritis   Yes [provider]  amitriptyline (ELAVIL) 10 MG tablet Take 20 mg by mouth at bedtime. Uses for Chronic Pain   Yes [provider]  amLODipine (NORVASC) 5 MG tablet Take 1 tablet (5 mg total) by mouth daily. 08/19/13  Yes Turner, Eber Hong, MD  Ascorbic Acid (VITAMIN C PO) Take 1 tablet by mouth daily.   Yes [provider]  atorvastatin (LIPITOR) 10 MG tablet Take 10 mg by mouth daily.   Yes [provider]  cephALEXin (KEFLEX) 500 MG capsule Take 1 capsule (500 mg total) by  mouth 4 (four) times daily. 06/07/17  Yes Ward, Ozella Almond, PA-C  cholecalciferol (VITAMIN D) 1000 UNITS tablet Take 1,000 Units by mouth daily.   Yes [provider]  docusate sodium (COLACE) 100 MG capsule Take 300 mg by mouth daily.   Yes [provider]  FIBER PO Take 2 tablets by mouth daily.   Yes [provider]  fluticasone (FLONASE) 50 MCG/ACT nasal spray Place 2 sprays into both nostrils daily. 11/11/13  Yes Collene Gobble, MD  gabapentin (NEURONTIN) 100 MG capsule Take 100 mg by mouth at bedtime.  01/16/17  Yes [provider]  MYRBETRIQ 50 MG TB24 tablet Take 50 mg by mouth daily.  11/17/16  Yes [provider]  ondansetron  (ZOFRAN ODT) 4 MG disintegrating tablet Take 1 tablet (4 mg total) by mouth every 8 (eight) hours as needed for nausea or vomiting. 06/07/17  Yes Ward, Ozella Almond, PA-C  oxyCODONE-acetaminophen (PERCOCET/ROXICET) 5-325 MG tablet Take 1-2 tablets by mouth every 4 (four) hours as needed for severe pain. 06/07/17  Yes Ward, Ozella Almond, PA-C    Inpatient Medications: Scheduled Meds: . aspirin EC  81 mg Oral Daily  . chlorhexidine  15 mL Mouth Rinse BID  . chlorhexidine gluconate (MEDLINE KIT)  15 mL Mouth Rinse BID  . docusate sodium  100 mg Oral BID  . mouth rinse  15 mL Mouth Rinse QID  . mouth rinse  15 mL Mouth Rinse q12n4p   Continuous Infusions: . sodium chloride 150 mL/hr at 06/10/17 1026  . heparin 750 Units/hr (06/10/17 1011)  . phenylephrine (NEO-SYNEPHRINE) Adult infusion Stopped (06/09/17 1900)  . piperacillin-tazobactam (ZOSYN)  IV Stopped (06/10/17 0540)  . sodium chloride     And  . sodium chloride    . [START ON 06/11/2017] vancomycin     PRN Meds: acetaminophen, diphenhydrAMINE **OR** diphenhydrAMINE, fentaNYL (SUBLIMAZE) injection, HYDROcodone-acetaminophen, morphine injection, ondansetron, opium-belladonna, oxybutynin  Allergies:    Allergies  Allergen Reactions  . Ciprofloxacin Hives  . Codeine Hives  . Demerol [Meperidine] Hives  . Other Hives    Hydrogen peroxide  . Ciprofloxacin Hcl   . Darvon [Propoxyphene]     Other reaction(s): OTHER  . Hydrogen Peroxide     White blisters  . Tramadol     "ITCHY ON THE INSIDE"    Social History:   Social History   Socioeconomic History  . Marital status: Married    Spouse name: Not on file  . Number of children: Not on file  . Years of education: Not on file  . Highest education level: Not on file  Social Needs  . Financial resource strain: Not on file  . Food insecurity - worry: Not on file  . Food insecurity - inability: Not on file  . Transportation needs - medical: Not on file  . Transportation needs  - non-medical: Not on file  Occupational History  . Not on file  Tobacco Use  . Smoking status: Former Smoker    Packs/day: 1.00    Years: 25.00    Pack years: 25.00    Types: Cigarettes    Last attempt to quit: 06/02/1993    Years since quitting: 24.0  . Smokeless tobacco: Never Used  Substance and Sexual Activity  . Alcohol use: No  . Drug use: No  . Sexual activity: Not on file  Other Topics Concern  . Not on file  Social History Narrative  . Not on file    Family History:  Family History  Problem Relation Age of Onset  . Stroke Mother   . Diabetes Mother   . Breast cancer Cousin      ROS:  Please see the history of present illness.  ROS  General:no colds or fevers, no weight changes, increased stress with family illness Skin:no rashes or ulcers HEENT:no blurred vision, no congestion CV:see HPI PUL:see HPI GI:no diarrhea constipation or melena, no indigestion GU:no hematuria, no dysuria MS:no joint pain, no claudication Neuro:no syncope, no lightheadedness Endo:no diabetes, no thyroid disease   Physical Exam/Data:   Vitals:   06/10/17 0750 06/10/17 0800 06/10/17 0930 06/10/17 1000  BP:  (!) 150/78  (!) 126/102  Pulse:      Resp:  16  16  Temp: 98.3 F (36.8 C)     TempSrc: Oral     SpO2:  99% 98% 99%  Weight:      Height:        Intake/Output Summary (Last 24 hours) at 06/10/2017 1029 Last data filed at 06/10/2017 0734 Gross per 24 hour  Intake 3430 ml  Output 1600 ml  Net 1830 ml   Filed Weights   06/09/17 1212  Weight: 166 lb 9.6 oz (75.6 kg)   Body mass index is 31.48 kg/m.  General:  Well nourished, well developed, in no acute distress, but pale, weak HEENT: normal Lymph: no adenopathy Neck: no JVD Endocrine:  No thryomegaly Vascular: No carotid bruits; 2+ pedal pulses bil, SCDs are in place Cardiac:  normal S1, S2; RRR; no murmur gallup rub or click Lungs:  clear to auscultation bilaterally, no wheezing, rhonchi or rales  Abd: soft,  nontender, no hepatomegaly  Ext: no edema Musculoskeletal:  No deformities, BUE and BLE strength normal and equal Skin: warm and dry  Neuro:  Alert and oriented X 3 MAE follows commands, no focal abnormalities noted Psych:  Normal affect   EKG:  The EKG was personally reviewed and demonstrates:  SR with deep T wave inversions inf. That are new.   Telemetry:  Telemetry was personally reviewed and demonstrates:  SR  Relevant CV Studies: Echo is pending   Last echo 2010 with normal EF and normal Echo + sclerotic aortic valve but no stensois  Laboratory Data:  Chemistry Recent Labs  Lab 06/07/17 1540 06/09/17 1517 06/09/17 1656 06/10/17 0515  NA 138 137 135 138  K 4.6 4.2 3.9 4.1  CL 103 103  --  108  CO2 24 22  --  22  GLUCOSE 137* 106*  --  134*  BUN 18 49*  --  41*  CREATININE 1.09* 2.28*  --  1.69*  CALCIUM 9.8 8.7*  --  8.1*  GFRNONAA 47* 19*  --  28*  GFRAA 54* 22*  --  32*  ANIONGAP 11 12  --  8    No results for input(s): PROT, ALBUMIN, AST, ALT, ALKPHOS, BILITOT in the last 168 hours. Hematology Recent Labs  Lab 06/07/17 1540 06/09/17 1517 06/09/17 1656 06/10/17 0515  WBC 16.5* 3.9*  --  17.5*  RBC 4.32 3.88  --  3.25*  HGB 13.0 11.4* 10.2* 9.9*  HCT 40.4 36.5 30.0* 30.1*  MCV 93.5 94.1  --  92.6  MCH 30.1 29.4  --  30.5  MCHC 32.2 31.2  --  32.9  RDW 13.5 14.5  --  14.6  PLT 296 178  --  147*   Cardiac Enzymes Recent Labs  Lab 06/09/17 1806  TROPONINI 19.37*   No results for  input(s): TROPIPOC in the last 168 hours.  BNPNo results for input(s): BNP, PROBNP in the last 168 hours.  DDimer No results for input(s): DDIMER in the last 168 hours.  Radiology/Studies:  X-ray Chest Pa Or Ap  Result Date: 06/09/2017 CLINICAL DATA:  Respiratory distress. Tachycardia. Sepsis. Intubated. EXAM: CHEST 1 VIEW COMPARISON:  08/14/2014 FINDINGS: Endotracheal tube tip 2.5 cm above the carina. Pacemaker overlies the chest. Chronic lung disease with widespread  interstitial markings. No sign of acute consolidation or collapse. No effusions. Bony structures unremarkable. IMPRESSION: Endotracheal tube well positioned. Diffuse chronic fibrotic lung markings. Electronically Signed   By: Nelson Chimes M.D.   On: 06/09/2017 15:50   Dg Abd 1 View  Result Date: 06/09/2017 CLINICAL DATA:  Encounter for orogastric tube EXAM: ABDOMEN - 1 VIEW COMPARISON:  Abdominal CT 06/07/2017 FINDINGS: An orogastric tube is coiled in the fundus with tip at the antrum. Left ureteral stent with visualized portion in expected location. Left nephrolithiasis measuring 1 cm. Symmetric lung aeration with nonspecific bilateral reticulation. IMPRESSION: Orogastric tube with tip at the distal stomach. Electronically Signed   By: Monte Fantasia M.D.   On: 06/09/2017 20:10   Portable Chest Xray  Result Date: 06/10/2017 CLINICAL DATA:  Respiratory failure. EXAM: PORTABLE CHEST 1 VIEW COMPARISON:  06/19/2017. FINDINGS: Low lung volumes. Unchanged ETT 2.6 cm above carina. Cardiac enlargement. Mild vascular congestion. No pneumothorax. IMPRESSION: Similar appearance to priors. Mild vascular congestion. Stable ETT. Electronically Signed   By: Staci Righter M.D.   On: 06/10/2017 07:12   Dg C-arm 1-60 Min-no Report  Result Date: 06/09/2017 Fluoroscopy was utilized by the requesting physician.  No radiographic interpretation.   Ct Renal Stone Study  Result Date: 06/07/2017 CLINICAL DATA:  Right-sided abdominal pain radiates to the back. EXAM: CT ABDOMEN AND PELVIS WITHOUT CONTRAST TECHNIQUE: Multidetector CT imaging of the abdomen and pelvis was performed following the standard protocol without IV contrast. COMPARISON:  04/03/2010 FINDINGS: Lower chest:  No acute findings. Hepatobiliary: No focal abnormality in the liver on this study without intravenous contrast. There is no evidence for gallstones, gallbladder wall thickening, or pericholecystic fluid. No intrahepatic or extrahepatic biliary dilation.  Pancreas: No focal mass lesion. No dilatation of the main duct. No intraparenchymal cyst. No peripancreatic edema. Spleen: No splenomegaly. No focal mass lesion. Adrenals/Urinary Tract: No adrenal nodule or mass. 2 mm nonobstructing stone identified interpolar right kidney. No right ureteral stone. 7 x 6 x 10 mm stone is identified in the proximal left ureter causing mild left hydronephrosis with perinephric edema. No other left ureteral stone. No bladder stone. Stomach/Bowel: Small hiatal hernia. Stomach is nondistended. No gastric wall thickening. No evidence of outlet obstruction. Duodenum is normally positioned as is the ligament of Treitz. No small bowel wall thickening. No small bowel dilatation. The appendix is normal. No gross colonic mass. No colonic wall thickening. Diffuse colonic diverticulosis is most advanced in the left colon. No evidence for diverticulitis. Vascular/Lymphatic: There is abdominal aortic atherosclerosis without aneurysm. There is no gastrohepatic or hepatoduodenal ligament lymphadenopathy. No intraperitoneal or retroperitoneal lymphadenopathy. No pelvic sidewall lymphadenopathy. Reproductive: Uterus unremarkable. There is no adnexal mass. 18 mm cystic focus in the left ovary was 3.4 cm on the prior study. Other: No intraperitoneal free fluid. Musculoskeletal: Degenerative changes noted lumbar spine with left-sided pars interarticularis defect at L5. IMPRESSION: 1. 7 x 6 x 10 mm stone in the proximal left ureter causes mild left hydronephrosis and prominent left perinephric edema. 2. Tiny nonobstructing stone right kidney.  3. Diffuse colonic diverticulosis without diverticulitis. 4.  Aortic Atherosclerois (ICD10-170.0) Electronically Signed   By: Misty Stanley M.D.   On: 06/07/2017 18:52    Assessment and Plan:   1. NSTEMI, inf wall -- on IV heparin and po ASA  No chest pain currently.   Echo is pending.  With troponin of 19 would eval with cardiac cath, in next one to 2 days or  so.  Just extubated and Cr was 2.2, is improving.  Added BB and lipitor 80 mg daily   Monitor Dr. Stanford Breed to see.  2. Sepsis due to UTI and ureteral stone on ABX per urology and CCM-recent stone removal yesterday- with hypotension now resolved. 3. Acute respiratory failure post op with re-intubation - on vent overnight but now extubated.  4. ureteral stone removed yesterday  5. Hx PVCs  6. AKI with pk Cr 2.2 now improving.    For questions or updates, please contact [place] Please consult [email or web address] for contact info under Cardiology/STEMI.   Signed, Cecilie Kicks, NP  06/10/2017 10:29 AM As above, patient seen and examined.  Briefly she is a 82 year old female with past medical history of hypertension admitted with left ureteral stone and UTI for evaluation of elevation myocardial infarction.  Patient admitted with abdominal pain and found to have a left ureteral stone.  She had left ureteral stent placed.  Following extubation in the PACU she developed dyspnea and tachycardia requiring sedation.  Cardiac markers elevated.  Cardiology asked to evaluate.  At present patient denies dyspnea or chest pain.  19.37.  Creatinine 1.69.  Echocardiogram shows sinus rhythm, slight inferior lateral ST elevation with T wave inversion. 1 non-ST elevation myocardial infarction-patient has ruled in.  Plan to treat with aspirin, heparin, statin and metoprolol.  She will require cardiac catheterization prior to discharge but needs to recover from recent events first.  The risks and benefit were discussed she agrees to proceed.  We will likely perform in the next few days.  Await echocardiogram for LV function. 2 ureteral stone status post stent and UTI-management per urology.  Continue antibiotics.  3 hypertension-I follow blood pressure on present medications and adjust regimen as needed.  4 acute kidney injury-creatinine is improving with gentle hydration.  Will follow.  Plan cardiac catheterization as  she stabilizes.  Kirk Ruths, MD

## 2017-06-10 NOTE — Progress Notes (Signed)
2D Echocardiogram has been performed.  Jacqueline Burke 06/10/2017, 2:17 PM

## 2017-06-10 NOTE — Progress Notes (Signed)
[facility] Progress Note Patient Name: Jacqueline Burke DOB: March 02, 1936 MRN: [id number]   Date of Service  06/10/2017  HPI/Events of Note  Patient intubated and ventilated - Nursing request to transfer to ICU bed status.   eICU Interventions  Will transfer to ICU bed status.      Intervention Category Major Interventions: Other:  Nuel Dejaynes Cornelia Copa 06/10/2017, 2:42 AM

## 2017-06-10 NOTE — Progress Notes (Signed)
ANTICOAGULATION CONSULT NOTE - Initial Consult  Pharmacy Consult for heparin Indication: chest pain/ACS  Allergies  Allergen Reactions  . Ciprofloxacin Hives  . Codeine Hives  . Demerol [Meperidine] Hives  . Other Hives    Hydrogen peroxide  . Ciprofloxacin Hcl   . Darvon [Propoxyphene]     Other reaction(s): OTHER  . Hydrogen Peroxide     White blisters  . Tramadol     "ITCHY ON THE INSIDE"    Patient Measurements: Height: 5\' 1"  (154.9 cm) Weight: 166 lb 9.6 oz (75.6 kg) IBW/kg (Calculated) : 47.8 Heparin Dosing Weight: 64.5  Vital Signs: Temp: 98.3 F (36.8 C) (01/09 0750) Temp Source: Oral (01/09 0750) BP: 150/78 (01/09 0800) Pulse Rate: 66 (01/09 0316)  Labs: Recent Labs    06/07/17 1540 06/09/17 1517 06/09/17 1656 06/09/17 1806 06/10/17 0515  HGB 13.0 11.4* 10.2*  --  9.9*  HCT 40.4 36.5 30.0*  --  30.1*  PLT 296 178  --   --  147*  APTT  --   --   --  39*  --   LABPROT  --   --   --  14.6  --   INR  --   --   --  1.15  --   CREATININE 1.09* 2.28*  --   --  1.69*  TROPONINI  --   --   --  19.37*  --     Estimated Creatinine Clearance: 25.1 mL/min (A) (by C-G formula based on SCr of 1.69 mg/dL (H)).   Medical History: Past Medical History:  Diagnosis Date  . Allergic rhinitis   . Chronic chest pain    and DOE w normal Stress cardiolyte  . Diverticulosis   . Heart murmur    Systolic heart murmur with Aortic valve sclerosis by ECHO  . Hyperlipidemia   . Hypertension   . Osteoarthritis    Erosive OA-MRI of R hand negative for synovitis, only OA-Dr Trudie Reed  . Osteopenia   . Pelvic prolapse   . PVC's (premature ventricular contractions)     Assessment: 56 YOF presenting for L ureteral stent for L ureteral calculus. She developed SOB in PACU requiring intubation.  Troponin elevated this am, pharmacy asked to dose heparin gtt for NSTEMI.  No anticoagulation use reported prior to admission.   Today. 06/10/2017  CBC: Hgb 9.9, mild  thrombocytopenia  Renal: AKI - better  Goal of Therapy:  Heparin level 0.3-0.7 units/ml Monitor platelets by anticoagulation protocol: Yes   Plan:   Heparin 2000 unit bolus (reduce dose for recent urological procedure)   Heparin 750 units/hr  Check 8h heparin level  Daily heparin level and CBC  Monitor for bleeding  Doreene Eland, PharmD, BCPS.   Pager: [phone] 06/10/2017 9:44 AM

## 2017-06-10 NOTE — Progress Notes (Signed)
ANTICOAGULATION CONSULT NOTE - Follow Up Consult  Pharmacy Consult for heparin Indication: chest pain/ACS  Allergies  Allergen Reactions  . Ciprofloxacin Hives  . Codeine Hives  . Demerol [Meperidine] Hives  . Other Hives    Hydrogen peroxide  . Ciprofloxacin Hcl   . Darvon [Propoxyphene]     Other reaction(s): OTHER  . Hydrogen Peroxide     White blisters  . Tramadol     "ITCHY ON THE INSIDE"    Patient Measurements: Height: 5\' 1"  (154.9 cm) Weight: 166 lb 9.6 oz (75.6 kg) IBW/kg (Calculated) : 47.8 Heparin Dosing Weight: 64.5  Vital Signs: Temp: 97.6 F (36.4 C) (01/09 1921) Temp Source: Oral (01/09 1921) BP: 163/86 (01/09 1900)  Labs: Recent Labs    06/09/17 1517 06/09/17 1656 06/09/17 1806 06/10/17 0515 06/10/17 1006 06/10/17 1526  HGB 11.4* 10.2*  --  9.9*  --   --   HCT 36.5 30.0*  --  30.1*  --   --   PLT 178  --   --  147*  --   --   APTT  --   --  39*  --   --   --   LABPROT  --   --  14.6  --   --   --   INR  --   --  1.15  --   --   --   CREATININE 2.28*  --   --  1.69*  --   --   TROPONINI  --   --  19.37*  --  9.77* 7.57*    Estimated Creatinine Clearance: 25.1 mL/min (A) (by C-G formula based on SCr of 1.69 mg/dL (H)).   Medical History: Past Medical History:  Diagnosis Date  . Allergic rhinitis   . Chronic chest pain    and DOE w normal Stress cardiolyte  . Diverticulosis   . Heart murmur    Systolic heart murmur with Aortic valve sclerosis by ECHO  . Hyperlipidemia   . Hypertension   . Osteoarthritis    Erosive OA-MRI of R hand negative for synovitis, only OA-Dr Trudie Reed  . Osteopenia   . Pelvic prolapse   . PVC's (premature ventricular contractions)     Assessment: 89 YOF presenting for L ureteral stent for L ureteral calculus. She developed SOB in PACU requiring intubation.  Troponin elevated this am, pharmacy asked to dose heparin gtt for NSTEMI.  No anticoagulation use reported prior to admission.   Today. 06/10/2017  First  heparin level undetectable. Heparin infusion at 750 units/hr.  CBC: Hgb 9.9, mild thrombocytopenia  Renal: AKI - better  Goal of Therapy:  Heparin level 0.3-0.7 units/ml Monitor platelets by anticoagulation protocol: Yes   Plan:   Heparin 2000 unit bolus   Heparin 950 units/hr  Check 8h heparin level  Daily heparin level and CBC  Monitor for bleeding  Hershal Coria, PharmD, BCPS Pager: [phone] 06/10/2017 8:17 PM

## 2017-06-10 NOTE — Progress Notes (Addendum)
Nutrition Brief Note  Pt identified as intubated.  RD spoke with pt who was just extubated this morning. Reports she ate a cheese biscuit on Sunday 1/6 and that was the last time she ate prior to symptoms developing. Pt feels hungry and thirsty. Weight is stable.  RD identified no nutrition needs at this time.  Wt Readings from Last 15 Encounters:  06/09/17 166 lb 9.6 oz (75.6 kg)  06/07/17 170 lb (77.1 kg)  05/22/17 160 lb (72.6 kg)  04/22/17 160 lb (72.6 kg)  02/19/17 171 lb (77.6 kg)  02/20/16 165 lb 12.8 oz (75.2 kg)  02/19/15 162 lb 6.4 oz (73.7 kg)  02/16/14 156 lb 1.9 oz (70.8 kg)  12/07/13 164 lb 9.6 oz (74.7 kg)  11/11/13 162 lb (73.5 kg)  08/19/13 163 lb (73.9 kg)  06/09/13 163 lb (73.9 kg)    Body mass index is 31.48 kg/m. Patient meets criteria for obesity based on current BMI.   Current diet order is NPO.  Labs and medications reviewed.   No nutrition interventions warranted at this time. If nutrition issues arise, please consult RD.   Clayton Bibles, MS, RD, [place] Dietitian Pager: [phone] After Hours Pager: [phone]

## 2017-06-10 NOTE — Progress Notes (Signed)
PULMONARY / CRITICAL CARE MEDICINE   Name: Jacqueline Burke MRN: [id number] DOB: 1935/09/02    ADMISSION DATE:  06/09/2017 CONSULTATION DATE: June 09, 2017  REFERRING MD: Dr. Gloriann Loan  CHIEF COMPLAINT: Respiratory failure  HISTORY OF PRESENT ILLNESS:   82 y/o female with urosepsis in the setting of left nephrolithiasis.    PAST MEDICAL HISTORY :  She  has a past medical history of Allergic rhinitis, Chronic chest pain, Diverticulosis, Heart murmur, Hyperlipidemia, Hypertension, Osteoarthritis, Osteopenia, Pelvic prolapse, and PVC's (premature ventricular contractions).   SUBJECTIVE:  Troponin positive Passing SBT  Off pressors  VITAL SIGNS: BP (!) 150/78   Pulse 66   Temp 98.3 F (36.8 C) (Oral)   Resp 16   Ht 5\' 1"  (1.549 m)   Wt 75.6 kg (166 lb 9.6 oz)   SpO2 99%   BMI 31.48 kg/m   HEMODYNAMICS:    VENTILATOR SETTINGS: Vent Mode: PRVC FiO2 (%):  [30 %-100 %] 30 % Set Rate:  [15 bmp] 15 bmp Vt Set:  [380 mL] 380 mL PEEP:  [5 cmH20] 5 cmH20 Plateau Pressure:  [13 cmH20-15 cmH20] 15 cmH20  INTAKE / OUTPUT: I/O last 3 completed shifts: In: 9622 [I.V.:2650; NG/GT:30; IV Piggyback:750] Out: 1400 [Urine:1150; Emesis/NG output:250]  PHYSICAL EXAMINATION:  General:  In bed on vent HENT: NCAT ETT in place PULM: CTA B, vent supported breathing CV: RRR, no mgr GI: BS+, soft, nontender MSK: normal bulk and tone Neuro: awake, following commands   LABS:  BMET Recent Labs  Lab 06/07/17 1540 06/09/17 1517 06/09/17 1656 06/10/17 0515  NA 138 137 135 138  K 4.6 4.2 3.9 4.1  CL 103 103  --  108  CO2 24 22  --  22  BUN 18 49*  --  41*  CREATININE 1.09* 2.28*  --  1.69*  GLUCOSE 137* 106*  --  134*    Electrolytes Recent Labs  Lab 06/07/17 1540 06/09/17 1517 06/10/17 0515  CALCIUM 9.8 8.7* 8.1*    CBC Recent Labs  Lab 06/07/17 1540 06/09/17 1517 06/09/17 1656 06/10/17 0515  WBC 16.5* 3.9*  --  17.5*  HGB 13.0 11.4* 10.2* 9.9*  HCT 40.4 36.5  30.0* 30.1*  PLT 296 178  --  147*    Coag's Recent Labs  Lab 06/09/17 1806  APTT 39*  INR 1.15    Sepsis Markers Recent Labs  Lab 06/09/17 1806 06/09/17 2146  LATICACIDVEN 1.4 1.1  PROCALCITON 41.45  --     ABG Recent Labs  Lab 06/09/17 1656  PHART 7.366  PCO2ART 39.0  PO2ART 391.0*    Liver Enzymes No results for input(s): AST, ALT, ALKPHOS, BILITOT, ALBUMIN in the last 168 hours.  Cardiac Enzymes Recent Labs  Lab 06/09/17 1806  TROPONINI 19.37*    Glucose No results for input(s): GLUCAP in the last 168 hours.  Imaging X-ray Chest Pa Or Ap  Result Date: 06/09/2017 CLINICAL DATA:  Respiratory distress. Tachycardia. Sepsis. Intubated. EXAM: CHEST 1 VIEW COMPARISON:  08/14/2014 FINDINGS: Endotracheal tube tip 2.5 cm above the carina. Pacemaker overlies the chest. Chronic lung disease with widespread interstitial markings. No sign of acute consolidation or collapse. No effusions. Bony structures unremarkable. IMPRESSION: Endotracheal tube well positioned. Diffuse chronic fibrotic lung markings. Electronically Signed   By: Nelson Chimes M.D.   On: 06/09/2017 15:50   Dg Abd 1 View  Result Date: 06/09/2017 CLINICAL DATA:  Encounter for orogastric tube EXAM: ABDOMEN - 1 VIEW COMPARISON:  Abdominal CT 06/07/2017 FINDINGS: An  orogastric tube is coiled in the fundus with tip at the antrum. Left ureteral stent with visualized portion in expected location. Left nephrolithiasis measuring 1 cm. Symmetric lung aeration with nonspecific bilateral reticulation. IMPRESSION: Orogastric tube with tip at the distal stomach. Electronically Signed   By: Monte Fantasia M.D.   On: 06/09/2017 20:10   Portable Chest Xray  Result Date: 06/10/2017 CLINICAL DATA:  Respiratory failure. EXAM: PORTABLE CHEST 1 VIEW COMPARISON:  06/19/2017. FINDINGS: Low lung volumes. Unchanged ETT 2.6 cm above carina. Cardiac enlargement. Mild vascular congestion. No pneumothorax. IMPRESSION: Similar appearance to  priors. Mild vascular congestion. Stable ETT. Electronically Signed   By: Staci Righter M.D.   On: 06/10/2017 07:12   Dg C-arm 1-60 Min-no Report  Result Date: 06/09/2017 Fluoroscopy was utilized by the requesting physician.  No radiographic interpretation.     STUDIES:  06/10/2016 Echo>   CULTURES: June 09, 2017 blood culture June 09, 2017 urine culture  ANTIBIOTICS: June 09, 2017 vanc>  June 09, 2017 zosyn >   SIGNIFICANT EVENTS: June 09, 2017 admission after left ureteral stent placement  LINES/TUBES: June 09, 2017 endotracheal tube June 09, 2017 left radial arterial line  DISCUSSION: 82 y/o female with severe sepsis from left pyelonephritis related to left nephrolithiasis.  Also had evidence of an NSTEMI.    ASSESSMENT / PLAN:  PULMONARY A: Acute respiratory failure with hypoxemia P:   Extubate today Oral care per protocol  CARDIOVASCULAR A:  Severe sepsis> better Sinus tachycardia post operatively, resolved Aortic stenosis > severity? NSTEMI > likely demand ischemia; 12 lead with inferior ischemic changes P:  Tele Add heparin per pharmacy Cardiology consult ASA daily Echo Consider b-blocker, defer to cardiology, await echo findings (aortic stenosis)  RENAL A:   Left nephrolithiasis Pyelonephritis due to stone P:   AKI> improving Monitor BMET and UOP Replace electrolytes as needed  GASTROINTESTINAL A:   No acute issues P:   Continue famotidine for stress ulcer prophylaxis  HEMATOLOGIC A:   Mild normocytic anemia without bleeding P:  Monitor for bleeding  INFECTIOUS A:   Severe sepsis with urinary tract infection/pyelonephritis, status post stone removal P:   Continue vanc/zosyn for now F/u cultures  ENDOCRINE A:   No acute issues P:   Monitor glucose  NEUROLOGIC A:   Need for sedation after intubation> resolved P:   Stop sedation protocol Holding home elavil, gabapentin for now   FAMILY  - Updates:  Updated  husband by phone on 1/9  - Inter-disciplinary family meet or Palliative Care meeting due by:  day 7  My cc time 33 minutes  Roselie Awkward, MD [place] [facility] Pager: [phone] Cell: [phone] After 3pm or if no response, call [phone]   06/10/2017, 8:45 AM

## 2017-06-10 NOTE — Care Management Note (Signed)
Case Management Note  Patient Details  Name: Jacqueline Burke MRN: [id number] Date of Birth: 01-Jan-1936  Subjective/Objective:                  resp failure with intubation, extubated am of [id number].  Action/Plan:  Date:  June 10, 2017 Chart reviewed for concurrent status and case management needs.  Will continue to follow patient progress.  Discharge Planning: following for needs  Expected discharge date: January 122019 Velva Harman, BSN, [place], [place]   Expected Discharge Date:  (unknown)               Expected Discharge Plan:  Home/Self Care  In-House Referral:     Discharge planning Services  CM Consult  Post Acute Care Choice:    Choice offered to:     DME Arranged:    DME Agency:     HH Arranged:    HH Agency:     Status of Service:  In process, will continue to follow  If discussed at Long Length of Stay Meetings, dates discussed:    Additional Comments:  Leeroy Cha, RN 06/10/2017, 9:38 AM

## 2017-06-11 ENCOUNTER — Telehealth: Payer: Self-pay | Admitting: *Deleted

## 2017-06-11 DIAGNOSIS — N12 Tubulo-interstitial nephritis, not specified as acute or chronic: Secondary | ICD-10-CM

## 2017-06-11 DIAGNOSIS — B9620 Unspecified Escherichia coli [E. coli] as the cause of diseases classified elsewhere: Secondary | ICD-10-CM

## 2017-06-11 DIAGNOSIS — R7881 Bacteremia: Secondary | ICD-10-CM

## 2017-06-11 DIAGNOSIS — J9601 Acute respiratory failure with hypoxia: Secondary | ICD-10-CM

## 2017-06-11 LAB — BASIC METABOLIC PANEL
ANION GAP: 7 (ref 5–15)
BUN: 41 mg/dL — ABNORMAL HIGH (ref 6–20)
CHLORIDE: 111 mmol/L (ref 101–111)
CO2: 23 mmol/L (ref 22–32)
Calcium: 8.2 mg/dL — ABNORMAL LOW (ref 8.9–10.3)
Creatinine, Ser: 1.22 mg/dL — ABNORMAL HIGH (ref 0.44–1.00)
GFR calc non Af Amer: 41 mL/min — ABNORMAL LOW (ref >60)
GFR, EST AFRICAN AMERICAN: 48 mL/min — AB (ref >60)
Glucose, Bld: 95 mg/dL (ref 65–99)
Potassium: 4.4 mmol/L (ref 3.5–5.1)
Sodium: 141 mmol/L (ref 135–145)

## 2017-06-11 LAB — CBC
HEMATOCRIT: 32.7 % — AB (ref 36.0–46.0)
HEMOGLOBIN: 10.6 g/dL — AB (ref 12.0–15.0)
MCH: 30.4 pg (ref 26.0–34.0)
MCHC: 32.4 g/dL (ref 30.0–36.0)
MCV: 93.7 fL (ref 78.0–100.0)
Platelets: 176 10*3/uL (ref 150–400)
RBC: 3.49 MIL/uL — ABNORMAL LOW (ref 3.87–5.11)
RDW: 14.9 % (ref 11.5–15.5)
WBC: 20.6 10*3/uL — AB (ref 4.0–10.5)

## 2017-06-11 LAB — URINE CULTURE: Culture: >=100000 — AB

## 2017-06-11 LAB — HEPARIN LEVEL (UNFRACTIONATED): Heparin Unfractionated: <0.1 IU/mL — ABNORMAL LOW (ref 0.30–0.70)

## 2017-06-11 MED ORDER — HEPARIN (PORCINE) IN NACL 100-0.45 UNIT/ML-% IJ SOLN
1400.0000 [IU]/h | INTRAMUSCULAR | Status: DC
Start: 2017-06-11 — End: 2017-06-12
  Administered 2017-06-11: 1350 [IU]/h via INTRAVENOUS

## 2017-06-11 MED ORDER — MIRABEGRON ER 50 MG PO TB24
50.0000 mg | ORAL_TABLET | Freq: Every day | ORAL | Status: DC
  Administered 2017-06-11 – 2017-06-16 (×6): 50 mg via ORAL
  Filled 2017-06-11 (×2): qty 2
  Filled 2017-06-11: qty 1
  Filled 2017-06-11 (×3): qty 2

## 2017-06-11 MED ORDER — METOPROLOL TARTRATE 25 MG PO TABS
25.0000 mg | ORAL_TABLET | Freq: Two times a day (BID) | ORAL | Status: DC
  Administered 2017-06-11 – 2017-06-16 (×10): 25 mg via ORAL
  Filled 2017-06-11 (×10): qty 1

## 2017-06-11 MED ORDER — ATORVASTATIN CALCIUM 10 MG PO TABS: 10.0000 mg | ORAL_TABLET | Freq: Every day | ORAL | Status: DC

## 2017-06-11 NOTE — Progress Notes (Signed)
ANTICOAGULATION CONSULT NOTE - Follow Up Consult  Pharmacy Consult for heparin Indication: chest pain/ACS  Allergies  Allergen Reactions  . Ciprofloxacin Hives  . Codeine Hives  . Demerol [Meperidine] Hives  . Other Hives    Hydrogen peroxide  . Ciprofloxacin Hcl   . Darvon [Propoxyphene]     Other reaction(s): OTHER  . Hydrogen Peroxide     White blisters  . Tramadol     "ITCHY ON THE INSIDE"    Patient Measurements: Height: 5\' 1"  (154.9 cm) Weight: 166 lb 9.6 oz (75.6 kg) IBW/kg (Calculated) : 47.8 Heparin Dosing Weight: 64.5  Vital Signs: Temp: 98.4 F (36.9 C) (01/10 1137) Temp Source: Oral (01/10 1137) BP: 154/80 (01/10 1400)  Labs: Recent Labs    06/09/17 1517 06/09/17 1656  06/09/17 1806 06/10/17 0515 06/10/17 1006 06/10/17 1526 06/10/17 1832 06/10/17 2149 06/11/17 0454 06/11/17 1503  HGB 11.4* 10.2*  --   --  9.9*  --   --   --   --  10.6*  --   HCT 36.5 30.0*  --   --  30.1*  --   --   --   --  32.7*  --   PLT 178  --   --   --  147*  --   --   --   --  176  --   APTT  --   --   --  39*  --   --   --   --   --   --   --   LABPROT  --   --   --  14.6  --   --   --   --   --   --   --   INR  --   --   --  1.15  --   --   --   --   --   --   --   HEPARINUNFRC  --   --   --   --   --   --   --  <0.10*  --  <0.10* <0.10*  CREATININE 2.28*  --   --   --  1.69*  --   --   --   --  1.22*  --   TROPONINI  --   --    < > 19.37*  --  9.77* 7.57*  --  6.35*  --   --    < > = values in this interval not displayed.    Estimated Creatinine Clearance: 34.8 mL/min (A) (by C-G formula based on SCr of 1.22 mg/dL (H)).   Medical History: Past Medical History:  Diagnosis Date  . Allergic rhinitis   . Chronic chest pain    and DOE w normal Stress cardiolyte  . Diverticulosis   . Heart murmur    Systolic heart murmur with Aortic valve sclerosis by ECHO  . Hyperlipidemia   . Hypertension   . Osteoarthritis    Erosive OA-MRI of R hand negative for synovitis,  only OA-Dr Trudie Reed  . Osteopenia   . Pelvic prolapse   . PVC's (premature ventricular contractions)     Assessment: 26 YOF presenting for L ureteral stent for L ureteral calculus. She developed SOB in PACU requiring intubation.  Troponin elevated this am, pharmacy asked to dose heparin gtt for NSTEMI.  No anticoagulation use reported prior to admission.   Today. 06/11/2017  Heparin level undetectable x 3, current rate 1150 units/hr  Per RN,  no issues with infusion and infusing at expected rate  CBC: Hgb decreased but stable, mild thrombocytopenia (resolved)  Renal: AKI - improving  No bleeding reported  Goal of Therapy:  Heparin level 0.3-0.7 units/ml Monitor platelets by anticoagulation protocol: Yes   Plan:   Increase heparin to 1350 units/hr  Check 8h heparin level  Daily heparin level and CBC  Monitor for bleeding  Doreene Eland, PharmD, BCPS.   Pager: [phone] 06/11/2017 4:48 PM

## 2017-06-11 NOTE — Progress Notes (Signed)
Progress Note  Patient Name: Jacqueline Burke Date of Encounter: 06/11/2017  Primary Cardiologist: Fransico Him, MD   Subjective   No chest pain, some SOB   Inpatient Medications    Scheduled Meds: . amitriptyline  20 mg Oral QHS  . amLODipine  5 mg Oral Daily  . aspirin EC  81 mg Oral Daily  . atorvastatin  10 mg Oral Daily  . atorvastatin  80 mg Oral q1800  . chlorhexidine  15 mL Mouth Rinse BID  . chlorhexidine gluconate (MEDLINE KIT)  15 mL Mouth Rinse BID  . docusate sodium  100 mg Oral BID  . gabapentin  100 mg Oral QHS  . mouth rinse  15 mL Mouth Rinse QID  . mouth rinse  15 mL Mouth Rinse q12n4p  . metoprolol tartrate  12.5 mg Oral BID  . mirabegron ER  50 mg Oral Daily   Continuous Infusions: . sodium chloride 150 mL/hr at 06/10/17 1900  . cefTRIAXone (ROCEPHIN)  IV Stopped (06/10/17 1454)  . heparin 1,150 Units/hr (06/11/17 0650)  . phenylephrine (NEO-SYNEPHRINE) Adult infusion Stopped (06/09/17 1900)  . sodium chloride     And  . sodium chloride     PRN Meds: acetaminophen, diphenhydrAMINE **OR** diphenhydrAMINE, fentaNYL (SUBLIMAZE) injection, HYDROcodone-acetaminophen, morphine injection, neomycin-bacitracin-polymyxin, ondansetron, opium-belladonna, oxybutynin   Vital Signs    Vitals:   06/10/17 2300 06/10/17 2312 06/11/17 0100 06/11/17 0324  BP: (!) 164/93  (!) 151/80   Pulse:      Resp: 16  13   Temp:  97.8 F (36.6 C)  97.8 F (36.6 C)  TempSrc:  Oral  Oral  SpO2: 99%  98%   Weight:      Height:        Intake/Output Summary (Last 24 hours) at 06/11/2017 0755 Last data filed at 06/11/2017 0600 Gross per 24 hour  Intake 3934.26 ml  Output 1250 ml  Net 2684.26 ml   Filed Weights   06/09/17 1212  Weight: 166 lb 9.6 oz (75.6 kg)    Telemetry    SR - Personally Reviewed  ECG    SR with RBBB and deeper T waves in inf lat leads - Personally Reviewed  Physical Exam   GEN: No acute distress.   Neck: No JVD Cardiac: RRR, no murmurs,  rubs, or gallops.  Respiratory: + rhonchi to auscultation bilaterally no rales. GI: Soft, nontender, non-distended  MS: No edema; No deformity. Neuro:  Nonfocal  Psych: Normal affect   Labs    Chemistry Recent Labs  Lab 06/09/17 1517 06/09/17 1656 06/10/17 0515 06/11/17 0454  NA 137 135 138 141  K 4.2 3.9 4.1 4.4  CL 103  --  108 111  CO2 22  --  22 23  GLUCOSE 106*  --  134* 95  BUN 49*  --  41* 41*  CREATININE 2.28*  --  1.69* 1.22*  CALCIUM 8.7*  --  8.1* 8.2*  GFRNONAA 19*  --  28* 41*  GFRAA 22*  --  32* 48*  ANIONGAP 12  --  8 7     Hematology Recent Labs  Lab 06/09/17 1517 06/09/17 1656 06/10/17 0515 06/11/17 0454  WBC 3.9*  --  17.5* 20.6*  RBC 3.88  --  3.25* 3.49*  HGB 11.4* 10.2* 9.9* 10.6*  HCT 36.5 30.0* 30.1* 32.7*  MCV 94.1  --  92.6 93.7  MCH 29.4  --  30.5 30.4  MCHC 31.2  --  32.9 32.4  RDW 14.5  --  14.6 14.9  PLT 178  --  147* 176    Cardiac Enzymes Recent Labs  Lab 06/09/17 1806 06/10/17 1006 06/10/17 1526 06/10/17 2149  TROPONINI 19.37* 9.77* 7.57* 6.35*   No results for input(s): TROPIPOC in the last 168 hours.   BNPNo results for input(s): BNP, PROBNP in the last 168 hours.   DDimer No results for input(s): DDIMER in the last 168 hours.   Radiology    X-ray Chest Pa Or Ap  Result Date: 06/09/2017 CLINICAL DATA:  Respiratory distress. Tachycardia. Sepsis. Intubated. EXAM: CHEST 1 VIEW COMPARISON:  08/14/2014 FINDINGS: Endotracheal tube tip 2.5 cm above the carina. Pacemaker overlies the chest. Chronic lung disease with widespread interstitial markings. No sign of acute consolidation or collapse. No effusions. Bony structures unremarkable. IMPRESSION: Endotracheal tube well positioned. Diffuse chronic fibrotic lung markings. Electronically Signed   By: Nelson Chimes M.D.   On: 06/09/2017 15:50   Dg Abd 1 View  Result Date: 06/09/2017 CLINICAL DATA:  Encounter for orogastric tube EXAM: ABDOMEN - 1 VIEW COMPARISON:  Abdominal CT  06/07/2017 FINDINGS: An orogastric tube is coiled in the fundus with tip at the antrum. Left ureteral stent with visualized portion in expected location. Left nephrolithiasis measuring 1 cm. Symmetric lung aeration with nonspecific bilateral reticulation. IMPRESSION: Orogastric tube with tip at the distal stomach. Electronically Signed   By: Monte Fantasia M.D.   On: 06/09/2017 20:10   Portable Chest Xray  Result Date: 06/10/2017 CLINICAL DATA:  Respiratory failure. EXAM: PORTABLE CHEST 1 VIEW COMPARISON:  06/19/2017. FINDINGS: Low lung volumes. Unchanged ETT 2.6 cm above carina. Cardiac enlargement. Mild vascular congestion. No pneumothorax. IMPRESSION: Similar appearance to priors. Mild vascular congestion. Stable ETT. Electronically Signed   By: Staci Righter M.D.   On: 06/10/2017 07:12   Dg C-arm 1-60 Min-no Report  Result Date: 06/09/2017 Fluoroscopy was utilized by the requesting physician.  No radiographic interpretation.    Cardiac Studies   Echo 06/10/17 Study Conclusions  - Left ventricle: The estimated ejection fraction was 55%. Doppler   parameters are consistent with both elevated ventricular   end-diastolic filling pressure and elevated left atrial filling   pressure. - Mitral valve: Calcified annulus. Mildly thickened leaflets . - Left atrium: The atrium was mildly dilated. - Atrial septum: There was increased thickness of the septum,   consistent with lipomatous hypertrophy. No defect or patent   foramen ovale was identified. - Pulmonary arteries: PA peak pressure: 34 mm Hg (S).  Patient Profile     82 y.o. female with a hx of HTN, PVCs and chronic non cardiac chest pain with normal stress test admitted with nephrolithiasis and removal, sepsis from UTI and NSTEMI.    Assessment & Plan    1.  NSTEMI, inf wall -- on IV heparin and po ASA  No chest pain currently.   Echo with normal EF, elevated PA PK pressure at 34.  With troponin of 19 eval with cardiac cath, Friday or  Monday?Marland Kitchen  Extubated yesterday, Cr was 2.2 on the 8th, is improving at 1.22 today.  Added BB and lipitor 80 mg daily   Monitor  I&O + 4835 since admit  IV fluids at 150 /hr per CCM 2. Sepsis due to UTI and ureteral stone on ABX per urology and CCM-recent stone removal yesterday- with hypotension now resolved. 3. Acute respiratory failure post op with re-intubation - on vent overnight but now extubated.  + productive cough  4. ureteral stone removed yesterday  5. Hx  PVCs  6. AKI with pk Cr 2.2 now improving at 1.22 7. HTN with BP 188/93   For questions or updates, please contact [facility] Please consult [email or web address] for contact info under Cardiology/STEMI.      Signed, Cecilie Kicks, NP  06/11/2017, 7:55 AM   As above, patient seen and examined.  She has mild dyspnea but denies chest pain.  Echocardiogram with preserved LV function.  She has ruled in for a non-ST elevation myocardial infarction.  Plan to continue medical therapy with aspirin, heparin and statin.  Increase metoprolol to 25 mg twice daily.  We will plan cardiac catheterization as she improves from her recent ureteral stone and urosepsis.  Patient may be getting volume overloaded.  Will decrease IV fluids to [facility] and follow.  May need gentle diuresis.  Continue antibiotics per primary service for urosepsis. Kirk Ruths, MD

## 2017-06-11 NOTE — Progress Notes (Signed)
PULMONARY / CRITICAL CARE MEDICINE   Name: Jacqueline Burke MRN: [id number] DOB: Jul 27, 1935    ADMISSION DATE:  06/09/2017 CONSULTATION DATE: June 09, 2017  REFERRING MD: Dr. Gloriann Loan  CHIEF COMPLAINT: Respiratory failure  HISTORY OF PRESENT ILLNESS:   82 y/o female with urosepsis in the setting of left nephrolithiasis.    PAST MEDICAL HISTORY :  She  has a past medical history of Allergic rhinitis, Chronic chest pain, Diverticulosis, Heart murmur, Hyperlipidemia, Hypertension, Osteoarthritis, Osteopenia, Pelvic prolapse, and PVC's (premature ventricular contractions).   SUBJECTIVE:  No chest pain Extubated yesterday   VITAL SIGNS: BP (!) 171/72 (BP Location: Right Arm)   Pulse 66   Temp 98.3 F (36.8 C) (Oral)   Resp (!) 30   Ht 5\' 1"  (1.549 m)   Wt 75.6 kg (166 lb 9.6 oz)   SpO2 97%   BMI 31.48 kg/m   HEMODYNAMICS:    VENTILATOR SETTINGS:    INTAKE / OUTPUT: I/O last 3 completed shifts: In: 6264.3 [P.O.:120; I.V.:5414.3; NG/GT:30; IV Piggyback:700] Out: 2650 [Urine:2400; Emesis/NG output:250]  PHYSICAL EXAMINATION:  General:  Resting comfortably in bed HENT: NCAT OP clear PULM: CTA B, normal effort CV: RRR, no mgr GI: BS+, soft, nontender MSK: normal bulk and tone Neuro: awake, alert, no distress, MAEW    LABS:  BMET Recent Labs  Lab 06/09/17 1517 06/09/17 1656 06/10/17 0515 06/11/17 0454  NA 137 135 138 141  K 4.2 3.9 4.1 4.4  CL 103  --  108 111  CO2 22  --  22 23  BUN 49*  --  41* 41*  CREATININE 2.28*  --  1.69* 1.22*  GLUCOSE 106*  --  134* 95    Electrolytes Recent Labs  Lab 06/09/17 1517 06/10/17 0515 06/11/17 0454  CALCIUM 8.7* 8.1* 8.2*    CBC Recent Labs  Lab 06/09/17 1517 06/09/17 1656 06/10/17 0515 06/11/17 0454  WBC 3.9*  --  17.5* 20.6*  HGB 11.4* 10.2* 9.9* 10.6*  HCT 36.5 30.0* 30.1* 32.7*  PLT 178  --  147* 176    Coag's Recent Labs  Lab 06/09/17 1806  APTT 39*  INR 1.15    Sepsis Markers Recent  Labs  Lab 06/09/17 1806 06/09/17 2146  LATICACIDVEN 1.4 1.1  PROCALCITON 41.45  --     ABG Recent Labs  Lab 06/09/17 1656  PHART 7.366  PCO2ART 39.0  PO2ART 391.0*    Liver Enzymes No results for input(s): AST, ALT, ALKPHOS, BILITOT, ALBUMIN in the last 168 hours.  Cardiac Enzymes Recent Labs  Lab 06/10/17 1006 06/10/17 1526 06/10/17 2149  TROPONINI 9.77* 7.57* 6.35*    Glucose No results for input(s): GLUCAP in the last 168 hours.  Imaging No results found.   STUDIES:  06/10/2016 Echo> LVEF 55%, increased diastolic pressure, PA pressure 34, LAE  CULTURES: June 09, 2017 blood culture > e. coli June 09, 2017 urine culture > e. coli  ANTIBIOTICS: June 09, 2017 vanc> 1/9 June 09, 2017 zosyn > 1/9 Jun 10, 2016 Ceftriaxone >   SIGNIFICANT EVENTS: June 09, 2017 admission after left ureteral stent placement, Troponin elevated  LINES/TUBES: June 09, 2017 endotracheal tube > 1/9 June 09, 2017 left radial arterial line  DISCUSSION: 82 y/o female with severe sepsis from left pyelonephritis related to left nephrolithiasis.  Also had evidence of an acute myocardial infarction.    ASSESSMENT / PLAN:  PULMONARY A: Acute respiratory failure with hypoxemia > resolved P:   Monitor O2 saturation Out of bed  today  CARDIOVASCULAR A:  Severe sepsis> better Acute myocardial infarction > likely demand ischemia; 12 lead with inferior ischemic changes P:  Tele Will need heart catheterization Continue metoprolol Continue ASA Continue heparin  RENAL A:   Left nephrolithiasis Pyelonephritis due to stone P:   Monitor BMET and UOP Replace electrolytes as needed   GASTROINTESTINAL A:   No acute issues P:   Advance diet  HEMATOLOGIC A:   Mild normocytic anemia without bleeding P:  Monitor for bleeding  INFECTIOUS A:   Severe sepsis with urinary tract infection/pyelonephritis, status post stone removal P:   Continue ceftriaxone Monitor  for fever  ENDOCRINE A:   No acute issues P:   Monitor glucose  NEUROLOGIC A:   Need for sedation after intubation> resolved P:   Minimize sedating medications   FAMILY  - Updates:  None bedside 1/10  - Inter-disciplinary family meet or Palliative Care meeting due by:  day 7  Will transfer to floor, will sign off.  Would consider internal medicine consultation.   Roselie Awkward, MD [place] [facility] Pager: [phone] Cell: [phone] After 3pm or if no response, call [phone]   06/11/2017, 9:59 AM

## 2017-06-11 NOTE — Telephone Encounter (Signed)
Post ED Visit - Positive Culture Follow-up  Culture report reviewed by antimicrobial stewardship pharmacist:  []  Elenor Quinones, Pharm.D. []  Heide Guile, Pharm.D., BCPS AQ-ID []  Parks Neptune, Pharm.D., BCPS []  Alycia Rossetti, Pharm.D., BCPS []  Coin, Pharm.D., BCPS, AAHIVP []  Legrand Como, Pharm.D., BCPS, AAHIVP []  Salome Arnt, PharmD, BCPS [x]  Jalene Mullet, PharmD []  Vincenza Hews, PharmD, BCPS  Positive urine culture Treated with Cephalexin, organism sensitive to the same and no further patient follow-up is required at this time.  Harlon Flor [place] 06/11/2017, 10:02 AM

## 2017-06-11 NOTE — Progress Notes (Signed)
ANTICOAGULATION CONSULT NOTE - Follow Up Consult  Pharmacy Consult for heparin Indication: chest pain/ACS  Allergies  Allergen Reactions  . Ciprofloxacin Hives  . Codeine Hives  . Demerol [Meperidine] Hives  . Other Hives    Hydrogen peroxide  . Ciprofloxacin Hcl   . Darvon [Propoxyphene]     Other reaction(s): OTHER  . Hydrogen Peroxide     White blisters  . Tramadol     "ITCHY ON THE INSIDE"    Patient Measurements: Height: 5\' 1"  (154.9 cm) Weight: 166 lb 9.6 oz (75.6 kg) IBW/kg (Calculated) : 47.8 Heparin Dosing Weight:   Vital Signs: Temp: 97.8 F (36.6 C) (01/10 0324) Temp Source: Oral (01/10 0324) BP: 151/80 (01/10 0100)  Labs: Recent Labs    06/09/17 1517 06/09/17 1656  06/09/17 1806 06/10/17 0515 06/10/17 1006 06/10/17 1526 06/10/17 1832 06/10/17 2149 06/11/17 0454  HGB 11.4* 10.2*  --   --  9.9*  --   --   --   --  10.6*  HCT 36.5 30.0*  --   --  30.1*  --   --   --   --  32.7*  PLT 178  --   --   --  147*  --   --   --   --  176  APTT  --   --   --  39*  --   --   --   --   --   --   LABPROT  --   --   --  14.6  --   --   --   --   --   --   INR  --   --   --  1.15  --   --   --   --   --   --   HEPARINUNFRC  --   --   --   --   --   --   --  <0.10*  --  <0.10*  CREATININE 2.28*  --   --   --  1.69*  --   --   --   --  1.22*  TROPONINI  --   --    < > 19.37*  --  9.77* 7.57*  --  6.35*  --    < > = values in this interval not displayed.    Estimated Creatinine Clearance: 34.8 mL/min (A) (by C-G formula based on SCr of 1.22 mg/dL (H)).   Medications:  Infusions:  . sodium chloride 150 mL/hr at 06/10/17 1900  . cefTRIAXone (ROCEPHIN)  IV Stopped (06/10/17 1454)  . heparin 950 Units/hr (06/10/17 2115)  . phenylephrine (NEO-SYNEPHRINE) Adult infusion Stopped (06/09/17 1900)  . sodium chloride     And  . sodium chloride      Assessment: Patient with low heparin level.  No heparin issues per RN.  Goal of Therapy:  Heparin level 0.3-0.7  units/ml Monitor platelets by anticoagulation protocol: Yes   Plan:  Increase heparin to 1150 units/hr Recheck level at [place] 06/11/2017,6:40 AM

## 2017-06-11 NOTE — Progress Notes (Signed)
2 Days Post-Op Subjective: No acute events overnight.  Pain controlled.  Denies N/V.  Vitals stable.  Voiding w/o difficulty (catheter removed yesterday 2/2 discomfort).    Objective: Vital signs in last 24 hours: Temp:  [97.4 F (36.3 C)-98.3 F (36.8 C)] 98.3 F (36.8 C) (01/10 0752) Resp:  [13-32] 30 (01/10 0900) BP: (134-175)/(66-113) 171/72 (01/10 0800) SpO2:  [88 %-99 %] 97 % (01/10 0900) Arterial Line BP: (171-198)/(98-127) 198/127 (01/09 1500)  Intake/Output from previous day: 01/09 0701 - 01/10 0700 In: 3934.3 [P.O.:120; I.V.:3764.3; IV Piggyback:50] Out: 1450 [Urine:1450]  Intake/Output this shift: Total I/O In: 321.3 [I.V.:321.3] Out: -   Physical Exam:  General: Alert and oriented CV: RRR, palpable distal pulses Lungs: CTAB, equal chest rise Abdomen: Soft, NTND, no rebound or guarding Ext: NT, No erythema  Lab Results: Recent Labs    06/09/17 1656 06/10/17 0515 06/11/17 0454  HGB 10.2* 9.9* 10.6*  HCT 30.0* 30.1* 32.7*   BMET Recent Labs    06/10/17 0515 06/11/17 0454  NA 138 141  K 4.1 4.4  CL 108 111  CO2 22 23  GLUCOSE 134* 95  BUN 41* 41*  CREATININE 1.69* 1.22*  CALCIUM 8.1* 8.2*     Studies/Results: X-ray Chest Pa Or Ap  Result Date: 06/09/2017 CLINICAL DATA:  Respiratory distress. Tachycardia. Sepsis. Intubated. EXAM: CHEST 1 VIEW COMPARISON:  08/14/2014 FINDINGS: Endotracheal tube tip 2.5 cm above the carina. Pacemaker overlies the chest. Chronic lung disease with widespread interstitial markings. No sign of acute consolidation or collapse. No effusions. Bony structures unremarkable. IMPRESSION: Endotracheal tube well positioned. Diffuse chronic fibrotic lung markings. Electronically Signed   By: Nelson Chimes M.D.   On: 06/09/2017 15:50   Dg Abd 1 View  Result Date: 06/09/2017 CLINICAL DATA:  Encounter for orogastric tube EXAM: ABDOMEN - 1 VIEW COMPARISON:  Abdominal CT 06/07/2017 FINDINGS: An orogastric tube is coiled in the fundus  with tip at the antrum. Left ureteral stent with visualized portion in expected location. Left nephrolithiasis measuring 1 cm. Symmetric lung aeration with nonspecific bilateral reticulation. IMPRESSION: Orogastric tube with tip at the distal stomach. Electronically Signed   By: Monte Fantasia M.D.   On: 06/09/2017 20:10   Portable Chest Xray  Result Date: 06/10/2017 CLINICAL DATA:  Respiratory failure. EXAM: PORTABLE CHEST 1 VIEW COMPARISON:  06/19/2017. FINDINGS: Low lung volumes. Unchanged ETT 2.6 cm above carina. Cardiac enlargement. Mild vascular congestion. No pneumothorax. IMPRESSION: Similar appearance to priors. Mild vascular congestion. Stable ETT. Electronically Signed   By: Staci Righter M.D.   On: 06/10/2017 07:12   Dg C-arm 1-60 Min-no Report  Result Date: 06/09/2017 Fluoroscopy was utilized by the requesting physician.  No radiographic interpretation.    Assessment/Plan: 1.  POD 2 s/p left JJ stent for an obstructing left ureteral stone- creatinine improving.  IVF per CCM 2.  Urosepsis 2/2 E. Coli UTI and bacteremia- continue IV rocephin 3.  NSTEMI- cardiology planning on cath either tomorrow or Monday 4.  Acute respiratory failure- extubated yesterday and tolerating [place].  -Will TTF when ok with CCM.    LOS: 1 day   Ellison Hughs, MD 06/11/2017, 11:07 AM  [facility] Pager: [phone]

## 2017-06-12 ENCOUNTER — Other Ambulatory Visit: Payer: Self-pay

## 2017-06-12 DIAGNOSIS — R079 Chest pain, unspecified: Secondary | ICD-10-CM

## 2017-06-12 DIAGNOSIS — I214 Non-ST elevation (NSTEMI) myocardial infarction: Secondary | ICD-10-CM | POA: Diagnosis present

## 2017-06-12 DIAGNOSIS — G8929 Other chronic pain: Secondary | ICD-10-CM

## 2017-06-12 DIAGNOSIS — R0603 Acute respiratory distress: Secondary | ICD-10-CM

## 2017-06-12 LAB — CULTURE, BLOOD (ROUTINE X 2): Special Requests: ADEQUATE

## 2017-06-12 LAB — BASIC METABOLIC PANEL
ANION GAP: 8 (ref 5–15)
BUN: 23 mg/dL — ABNORMAL HIGH (ref 6–20)
CALCIUM: 8.4 mg/dL — AB (ref 8.9–10.3)
CO2: 20 mmol/L — ABNORMAL LOW (ref 22–32)
Chloride: 111 mmol/L (ref 101–111)
Creatinine, Ser: 0.91 mg/dL (ref 0.44–1.00)
GFR, EST NON AFRICAN AMERICAN: 58 mL/min — AB (ref >60)
Glucose, Bld: 90 mg/dL (ref 65–99)
Potassium: 3.9 mmol/L (ref 3.5–5.1)
SODIUM: 139 mmol/L (ref 135–145)

## 2017-06-12 LAB — CBC
HCT: 32 % — ABNORMAL LOW (ref 36.0–46.0)
Hemoglobin: 10.6 g/dL — ABNORMAL LOW (ref 12.0–15.0)
MCH: 30.5 pg (ref 26.0–34.0)
MCHC: 33.1 g/dL (ref 30.0–36.0)
MCV: 92.2 fL (ref 78.0–100.0)
PLATELETS: 178 10*3/uL (ref 150–400)
RBC: 3.47 MIL/uL — ABNORMAL LOW (ref 3.87–5.11)
RDW: 14.7 % (ref 11.5–15.5)
WBC: 13.9 10*3/uL — AB (ref 4.0–10.5)

## 2017-06-12 LAB — HEPARIN LEVEL (UNFRACTIONATED)
HEPARIN UNFRACTIONATED: 0.22 [IU]/mL — AB (ref 0.30–0.70)
HEPARIN UNFRACTIONATED: 0.35 [IU]/mL (ref 0.30–0.70)
Heparin Unfractionated: 0.27 IU/mL — ABNORMAL LOW (ref 0.30–0.70)

## 2017-06-12 MED ORDER — CEFPODOXIME PROXETIL 200 MG PO TABS
200.0000 mg | ORAL_TABLET | Freq: Two times a day (BID) | ORAL | Status: DC
  Administered 2017-06-12 – 2017-06-16 (×9): 200 mg via ORAL
  Filled 2017-06-12 (×9): qty 1

## 2017-06-12 MED ORDER — SULFAMETHOXAZOLE-TRIMETHOPRIM 400-80 MG PO TABS: 1.0000 | ORAL_TABLET | Freq: Two times a day (BID) | ORAL | Status: DC

## 2017-06-12 MED ORDER — HEPARIN (PORCINE) IN NACL 100-0.45 UNIT/ML-% IJ SOLN
1550.0000 [IU]/h | INTRAMUSCULAR | Status: DC
  Administered 2017-06-13 – 2017-06-15 (×4): 1550 [IU]/h via INTRAVENOUS
  Filled 2017-06-12 (×5): qty 250

## 2017-06-12 MED ORDER — ORAL CARE MOUTH RINSE
15.0000 mL | Freq: Two times a day (BID) | OROMUCOSAL | Status: DC
  Administered 2017-06-12 – 2017-06-16 (×3): 15 mL via OROMUCOSAL

## 2017-06-12 MED ORDER — FENTANYL CITRATE (PF) 100 MCG/2ML IJ SOLN: 12.5000 ug | INTRAMUSCULAR | Status: DC | PRN

## 2017-06-12 NOTE — Progress Notes (Signed)
Progress Note  Patient Name: Jacqueline Burke Date of Encounter: 06/12/2017  Primary Cardiologist: Fransico Him, MD   Subjective   No chest pain or dyspnea   Inpatient Medications    Scheduled Meds: . amitriptyline  20 mg Oral QHS  . amLODipine  5 mg Oral Daily  . aspirin EC  81 mg Oral Daily  . atorvastatin  80 mg Oral q1800  . docusate sodium  100 mg Oral BID  . gabapentin  100 mg Oral QHS  . mouth rinse  15 mL Mouth Rinse BID  . metoprolol tartrate  25 mg Oral BID  . mirabegron ER  50 mg Oral Daily   Continuous Infusions: . sodium chloride 10 mL/hr at 06/11/17 2200  . cefTRIAXone (ROCEPHIN)  IV Stopped (06/11/17 1551)  . heparin 1,400 Units/hr (06/12/17 0325)   PRN Meds: acetaminophen, diphenhydrAMINE **OR** diphenhydrAMINE, fentaNYL (SUBLIMAZE) injection, HYDROcodone-acetaminophen, morphine injection, neomycin-bacitracin-polymyxin, ondansetron, opium-belladonna, oxybutynin   Vital Signs    Vitals:   06/11/17 2141 06/11/17 2326 06/11/17 2353 06/12/17 0600  BP: (!) 155/74  (!) 155/79 135/64  Pulse:   69 67  Resp: (!) 21  20 20   Temp:  97.8 F (36.6 C) 98.1 F (36.7 C) 98.8 F (37.1 C)  TempSrc:  Oral Oral Oral  SpO2: 96%  100% 99%  Weight:      Height:        Intake/Output Summary (Last 24 hours) at 06/12/2017 1038 Last data filed at 06/12/2017 0900 Gross per 24 hour  Intake 1379.7 ml  Output 450 ml  Net 929.7 ml   Filed Weights   06/09/17 1212  Weight: 166 lb 9.6 oz (75.6 kg)    Telemetry    Sinus with 5 beats NSVT - Personally Reviewed   Physical Exam   GEN: No acute distress.   Neck: No JVD Cardiac: RRR, no murmurs, rubs, or gallops.  Respiratory: Clear to auscultation bilaterally. GI: Soft, nontender, non-distended  MS: No edema Neuro:  Nonfocal  Psych: Normal affect   Labs    Chemistry Recent Labs  Lab 06/10/17 0515 06/11/17 0454 06/12/17 0223  NA 138 141 139  K 4.1 4.4 3.9  CL 108 111 111  CO2 22 23 20*  GLUCOSE 134* 95  90  BUN 41* 41* 23*  CREATININE 1.69* 1.22* 0.91  CALCIUM 8.1* 8.2* 8.4*  GFRNONAA 28* 41* 58*  GFRAA 32* 48* >60  ANIONGAP 8 7 8      Hematology Recent Labs  Lab 06/10/17 0515 06/11/17 0454 06/12/17 0223  WBC 17.5* 20.6* 13.9*  RBC 3.25* 3.49* 3.47*  HGB 9.9* 10.6* 10.6*  HCT 30.1* 32.7* 32.0*  MCV 92.6 93.7 92.2  MCH 30.5 30.4 30.5  MCHC 32.9 32.4 33.1  RDW 14.6 14.9 14.7  PLT 147* 176 178    Cardiac Enzymes Recent Labs  Lab 06/09/17 1806 06/10/17 1006 06/10/17 1526 06/10/17 2149  TROPONINI 19.37* 9.77* 7.57* 6.35*      Patient Profile     82 year old female with past medical history of hypertension admitted with left ureteral stone and UTI for evaluation of elevation myocardial infarction.  Patient admitted with abdominal pain and found to have a left ureteral stone.  She had left ureteral stent placed.  Following extubation in the PACU she developed dyspnea and tachycardia requiring sedation.  Cardiac markers elevated.  Cardiology asked to evaluate.      Assessment & Plan    1 non-ST elevation myocardial infarction-continue aspirin, heparin, statin and metoprolol. Plan cardiac  catheterization on Monday. The risks and benefit including MI, CVA and death were discussed she agrees to proceed.    2 ureteral stone status post stent and UTI-management per urology.  Continue antibiotics.  3 hypertension-BP mildly elevated; norvasc added; follow BP and adjust regimen as needed.  4 acute kidney injury-creatinine is improving; plan cath on Monday.   For questions or updates, please contact [place] Please consult [email or web address] for contact info under Cardiology/STEMI.      Signed, Kirk Ruths, MD  06/12/2017, 10:38 AM

## 2017-06-12 NOTE — Progress Notes (Signed)
Pharmacy - IV heparin  Assessment:    Please see note from Dia Sitter, PharmD earlier today for full details.  Briefly, 82 y.o. female on IV heparin for NSTEMI.    Rate increased earlier this afternoon for subtherapeutic heparin level.  Most recent heparin level now therapeutic at 0.35 on 1550 units/hr  Plan:   Continue heparin at 1550 units/hr  Daily CBC and heparin level ordered  Reuel Boom, PharmD, BCPS Pager: [phone] 06/12/2017, 9:51 PM

## 2017-06-12 NOTE — Progress Notes (Signed)
ANTICOAGULATION CONSULT NOTE - Follow Up Consult  Pharmacy Consult for heparin Indication: chest pain/ACS  Allergies  Allergen Reactions  . Ciprofloxacin Hives  . Codeine Hives  . Demerol [Meperidine] Hives  . Other Hives    Hydrogen peroxide  . Ciprofloxacin Hcl   . Darvon [Propoxyphene]     Other reaction(s): OTHER  . Hydrogen Peroxide     White blisters  . Tramadol     "ITCHY ON THE INSIDE"    Patient Measurements: Height: 5\' 1"  (154.9 cm) Weight: 166 lb 9.6 oz (75.6 kg) IBW/kg (Calculated) : 47.8 Heparin Dosing Weight: 64.5  Vital Signs: Temp: 98.1 F (36.7 C) (01/10 2353) Temp Source: Oral (01/10 2353) BP: 155/79 (01/10 2353) Pulse Rate: 69 (01/10 2353)  Labs: Recent Labs    06/09/17 1806 06/10/17 0515 06/10/17 1006 06/10/17 1526  06/10/17 2149 06/11/17 0454 06/11/17 1503 06/12/17 0223  HGB  --  9.9*  --   --   --   --  10.6*  --  10.6*  HCT  --  30.1*  --   --   --   --  32.7*  --  32.0*  PLT  --  147*  --   --   --   --  176  --  178  APTT 39*  --   --   --   --   --   --   --   --   LABPROT 14.6  --   --   --   --   --   --   --   --   INR 1.15  --   --   --   --   --   --   --   --   HEPARINUNFRC  --   --   --   --    < >  --  <0.10* <0.10* 0.27*  CREATININE  --  1.69*  --   --   --   --  1.22*  --  0.91  TROPONINI 19.37*  --  9.77* 7.57*  --  6.35*  --   --   --    < > = values in this interval not displayed.    Estimated Creatinine Clearance: 46.6 mL/min (by C-G formula based on SCr of 0.91 mg/dL).   Medical History: Past Medical History:  Diagnosis Date  . Allergic rhinitis   . Chronic chest pain    and DOE w normal Stress cardiolyte  . Diverticulosis   . Heart murmur    Systolic heart murmur with Aortic valve sclerosis by ECHO  . Hyperlipidemia   . Hypertension   . Osteoarthritis    Erosive OA-MRI of R hand negative for synovitis, only OA-Dr Trudie Reed  . Osteopenia   . Pelvic prolapse   . PVC's (premature ventricular contractions)      Assessment: 70 YOF presenting for L ureteral stent for L ureteral calculus. She developed SOB in PACU requiring intubation.  Troponin elevated this am, pharmacy asked to dose heparin gtt for NSTEMI.  No anticoagulation use reported prior to admission.   Today. 06/12/2017  Heparin level still subtherapeutic but rising after increase in heparin rate from 1150 to 1350 units/hr last PM  Per RN, no issues with infusion and infusing at expected rate  CBC: Hgb decreased but stable, mild thrombocytopenia (resolved)  Renal: AKI - improving  No bleeding reported  Goal of Therapy:  Heparin level 0.3-0.7 units/ml Monitor platelets by anticoagulation protocol: Yes  Plan:   Increase IV heparin to 1400 units/hr  Recheck 8h heparin level  Daily heparin level and CBC  Monitor for bleeding   Adrian Saran, PharmD, BCPS Pager [phone] 06/12/2017 3:11 AM

## 2017-06-12 NOTE — Progress Notes (Signed)
ANTICOAGULATION CONSULT NOTE - Follow Up Consult  Pharmacy Consult for heparin Indication: chest pain/ACS  Allergies  Allergen Reactions  . Ciprofloxacin Hives  . Codeine Hives  . Demerol [Meperidine] Hives  . Other Hives    Hydrogen peroxide  . Ciprofloxacin Hcl   . Darvon [Propoxyphene]     Other reaction(s): OTHER  . Hydrogen Peroxide     White blisters  . Tramadol     "ITCHY ON THE INSIDE"    Patient Measurements: Height: 5\' 1"  (154.9 cm) Weight: 166 lb 9.6 oz (75.6 kg) IBW/kg (Calculated) : 47.8 Heparin Dosing Weight: 64.5  Vital Signs: Temp: 98.8 F (37.1 C) (01/11 0600) Temp Source: Oral (01/11 0600) BP: 135/64 (01/11 0600) Pulse Rate: 67 (01/11 0600)  Labs: Recent Labs    06/09/17 1806 06/10/17 0515 06/10/17 1006 06/10/17 1526  06/10/17 2149 06/11/17 0454 06/11/17 1503 06/12/17 0223 06/12/17 1132  HGB  --  9.9*  --   --   --   --  10.6*  --  10.6*  --   HCT  --  30.1*  --   --   --   --  32.7*  --  32.0*  --   PLT  --  147*  --   --   --   --  176  --  178  --   APTT 39*  --   --   --   --   --   --   --   --   --   LABPROT 14.6  --   --   --   --   --   --   --   --   --   INR 1.15  --   --   --   --   --   --   --   --   --   HEPARINUNFRC  --   --   --   --    < >  --  <0.10* <0.10* 0.27* 0.22*  CREATININE  --  1.69*  --   --   --   --  1.22*  --  0.91  --   TROPONINI 19.37*  --  9.77* 7.57*  --  6.35*  --   --   --   --    < > = values in this interval not displayed.    Estimated Creatinine Clearance: 46.6 mL/min (by C-G formula based on SCr of 0.91 mg/dL).   Medical History: Past Medical History:  Diagnosis Date  . Allergic rhinitis   . Chronic chest pain    and DOE w normal Stress cardiolyte  . Diverticulosis   . Heart murmur    Systolic heart murmur with Aortic valve sclerosis by ECHO  . Hyperlipidemia   . Hypertension   . Osteoarthritis    Erosive OA-MRI of R hand negative for synovitis, only OA-Dr Trudie Reed  . Osteopenia   .  Pelvic prolapse   . PVC's (premature ventricular contractions)     Assessment: 55 YOF presenting for L ureteral stent for L ureteral calculus on 1/8. She developed SOB in PACU requiring intubation.  Troponin was elevated on 1/9 with heparin started for NSTEMI.  Cardiology team recommends cardiac cath procedure on 1/14.   Today, 06/12/2017: - Heparin level remains sub-therapeutic despite rate increased to 1400 units/hr ([er RN, no issues with IV line) - cbc stable - no bleeding documented  Goal of Therapy:  Heparin level 0.3-0.7 units/ml Monitor platelets  by anticoagulation protocol: Yes   Plan:  - Increase heparin to 1550 units/hr - Check 8h heparin level - Monitor for bleeding  Dia Sitter, PharmD, BCPS 06/12/2017 12:54 PM

## 2017-06-12 NOTE — Progress Notes (Signed)
[facility] PROGRESS NOTE  Jacqueline Burke OQH:[id number] DOB: 04/05/1936 DOA: 06/09/2017 PCP: Darcus Austin, MD  Brief Summary: 82 year old female who was taken to the OR due to kidney stone flank pain back pain, nausea, vomiting.  Patient had a cystoscopy with pyelogram and a left ureteral stent placement with stone removal.  The procedure was uneventful.  After intubation the patient developed tachycardia, anxiety, increasing shortness of breath.  She was reintubated due to respiratory failure.  Patient was admitted to ICU.  Troponins were drawn as part of her workup, and she was found to have an NSTEMI.  Heparin was started and cardiology was consulted on 1/9.  The patient was successfully extubated on 1/9 the patient was transferred out of the ICU on the following day.  Hospitalists were asked to assume care of the patient.  Assessment/Plan: Active Problems:   Ureteral calculus   Chronic chest pain   Acute respiratory failure with hypoxemia (HCC)   E coli bacteremia   Pyelonephritis   NSTEMI (non-ST elevated myocardial infarction) ([place])   1. Sepsis  Resolved 2. Acute respiratory failure with hypoxemia  Resolved 3. E Coli Bacteremia  Resolving  Patient transition to Bactrim 4. Pyelonephritis 5. Ureteral Calculus  Ureteral stent placed -Ditropan for bladder spasms 6. NSTEMI  Continue heparin  Patient to go for cath on Monday  Beta-blocker, baby aspirin, Norvasc, Lipitor high-dose  Code Status: Full code Family Communication: Husband in the room Disposition Plan: Pending   Consultants:  Cardiology  Urology  Procedures:  Ureteral stent placement on 1/8  Antibiotics:  Bactrim  HPI/Subjective: Patient feeling improved.  Denies chest pain.  Denies shortness of breath.  No fevers, chills, nausea, vomiting.  Appetite good.  Objective: Vitals:   06/11/17 2353 06/12/17 0600  BP: (!) 155/79 135/64  Pulse: 69 67  Resp: 20 20  Temp: 98.1 F (36.7 C) 98.8  F (37.1 C)  SpO2: 100% 99%    Intake/Output Summary (Last 24 hours) at 06/12/2017 1337 Last data filed at 06/12/2017 0900 Gross per 24 hour  Intake 733.7 ml  Output 450 ml  Net 283.7 ml   Filed Weights   06/09/17 1212  Weight: 75.6 kg (166 lb 9.6 oz)    Exam:   General: Awake, alert, oriented x3  Cardiovascular: Regular rate with normal S1/S2 sounds.  No murmurs  Respiratory: Clear to auscultation bilaterally with no wheezes, rales, rhonchi  Abdomen: Soft, nontender, nondistended  Musculoskeletal: Normal range of motion  Extremity: No edema.  Warm extremities.  No erythema  Data Reviewed: Basic Metabolic Panel: Recent Labs  Lab 06/07/17 1540 06/09/17 1517 06/09/17 1656 06/10/17 0515 06/11/17 0454 06/12/17 0223  NA 138 137 135 138 141 139  K 4.6 4.2 3.9 4.1 4.4 3.9  CL 103 103  --  108 111 111  CO2 24 22  --  22 23 20*  GLUCOSE 137* 106*  --  134* 95 90  BUN 18 49*  --  41* 41* 23*  CREATININE 1.09* 2.28*  --  1.69* 1.22* 0.91  CALCIUM 9.8 8.7*  --  8.1* 8.2* 8.4*   Liver Function Tests: No results for input(s): AST, ALT, ALKPHOS, BILITOT, PROT, ALBUMIN in the last 168 hours. No results for input(s): LIPASE, AMYLASE in the last 168 hours. No results for input(s): AMMONIA in the last 168 hours. CBC: Recent Labs  Lab 06/07/17 1540 06/09/17 1517 06/09/17 1656 06/10/17 0515 06/11/17 0454 06/12/17 0223  WBC 16.5* 3.9*  --  17.5* 20.6* 13.9*  NEUTROABS  --  2.7  --   --   --   --   HGB 13.0 11.4* 10.2* 9.9* 10.6* 10.6*  HCT 40.4 36.5 30.0* 30.1* 32.7* 32.0*  MCV 93.5 94.1  --  92.6 93.7 92.2  PLT 296 178  --  147* 176 178   Cardiac Enzymes: Recent Labs  Lab 06/09/17 1806 06/10/17 1006 06/10/17 1526 06/10/17 2149  TROPONINI 19.37* 9.77* 7.57* 6.35*   BNP (last 3 results) No results for input(s): BNP in the last 8760 hours.  ProBNP (last 3 results) No results for input(s): PROBNP in the last 8760 hours.  CBG: No results for input(s): GLUCAP  in the last 168 hours.  Recent Results (from the past 240 hour(s))  Urine culture     Status: Abnormal   Collection Time: 06/07/17  7:51 PM  Result Value Ref Range Status   Specimen Description URINE, RANDOM  Final   Special Requests NONE  Final   Culture 20,000 COLONIES/mL ESCHERICHIA COLI (A)  Final   Report Status 06/10/2017 FINAL  Final   Organism ID, Bacteria ESCHERICHIA COLI (A)  Final      Susceptibility   Escherichia coli - MIC*    AMPICILLIN >=32 RESISTANT Resistant     CEFAZOLIN >=64 RESISTANT Resistant     CEFTRIAXONE <=1 SENSITIVE Sensitive     CIPROFLOXACIN <=0.25 SENSITIVE Sensitive     GENTAMICIN <=1 SENSITIVE Sensitive     IMIPENEM <=0.25 SENSITIVE Sensitive     NITROFURANTOIN <=16 SENSITIVE Sensitive     TRIMETH/SULFA <=20 SENSITIVE Sensitive     AMPICILLIN/SULBACTAM >=32 RESISTANT Resistant     PIP/TAZO <=4 SENSITIVE Sensitive     Extended ESBL NEGATIVE Sensitive     * 20,000 COLONIES/mL ESCHERICHIA COLI  Culture, Urine     Status: Abnormal   Collection Time: 06/09/17  3:26 PM  Result Value Ref Range Status   Specimen Description URINE, CATHETERIZED  Final   Special Requests NONE  Final   Culture >=100,000 COLONIES/mL ESCHERICHIA COLI (A)  Final   Report Status 06/11/2017 FINAL  Final   Organism ID, Bacteria ESCHERICHIA COLI (A)  Final      Susceptibility   Escherichia coli - MIC*    AMPICILLIN >=32 RESISTANT Resistant     CEFAZOLIN >=64 RESISTANT Resistant     CEFTRIAXONE <=1 SENSITIVE Sensitive     CIPROFLOXACIN <=0.25 SENSITIVE Sensitive     GENTAMICIN <=1 SENSITIVE Sensitive     IMIPENEM <=0.25 SENSITIVE Sensitive     NITROFURANTOIN <=16 SENSITIVE Sensitive     TRIMETH/SULFA <=20 SENSITIVE Sensitive     AMPICILLIN/SULBACTAM >=32 RESISTANT Resistant     PIP/TAZO <=4 SENSITIVE Sensitive     Extended ESBL NEGATIVE Sensitive     * >=100,000 COLONIES/mL ESCHERICHIA COLI  Culture, blood (x 2)     Status: None (Preliminary result)   Collection Time:  06/09/17  6:06 PM  Result Value Ref Range Status   Specimen Description BLOOD CENTRAL LINE  Final   Special Requests   Final    BOTTLES DRAWN AEROBIC ONLY Blood Culture adequate volume   Culture   Final    NO GROWTH 2 DAYS Performed at [facility], [address] [place], [place] [address]    Report Status PENDING  Incomplete  Culture, blood (x 2)     Status: Abnormal   Collection Time: 06/09/17  6:29 PM  Result Value Ref Range Status   Specimen Description BLOOD RIGHT HAND  Final  Special Requests IN PEDIATRIC BOTTLE Blood Culture adequate volume  Final   Culture  Setup Time   Final    GRAM NEGATIVE RODS IN PEDIATRIC BOTTLE CRITICAL RESULT CALLED TO, READ BACK BY AND VERIFIED WITH: J. Scherrie November Pharm.D. 11:40 06/10/17 ([email or web address]) Performed at [facility], [place] [address] [place], [place] [address]    Culture ESCHERICHIA COLI (A)  Final   Report Status 06/12/2017 FINAL  Final   Organism ID, Bacteria ESCHERICHIA COLI  Final      Susceptibility   Escherichia coli - MIC*    AMPICILLIN >=32 RESISTANT Resistant     CEFAZOLIN >=64 RESISTANT Resistant     CEFEPIME <=1 SENSITIVE Sensitive     CEFTAZIDIME <=1 SENSITIVE Sensitive     CEFTRIAXONE <=1 SENSITIVE Sensitive     CIPROFLOXACIN <=0.25 SENSITIVE Sensitive     GENTAMICIN <=1 SENSITIVE Sensitive     IMIPENEM <=0.25 SENSITIVE Sensitive     TRIMETH/SULFA <=20 SENSITIVE Sensitive     AMPICILLIN/SULBACTAM >=32 RESISTANT Resistant     PIP/TAZO <=4 SENSITIVE Sensitive     Extended ESBL NEGATIVE Sensitive     * ESCHERICHIA COLI  Blood Culture ID Panel (Reflexed)     Status: Abnormal   Collection Time: 06/09/17  6:29 PM  Result Value Ref Range Status   Enterococcus species NOT DETECTED NOT DETECTED Final   Listeria monocytogenes NOT DETECTED NOT DETECTED Final   Staphylococcus species NOT DETECTED NOT DETECTED Final   Staphylococcus aureus NOT DETECTED NOT DETECTED Final   Streptococcus species NOT DETECTED NOT DETECTED  Final   Streptococcus agalactiae NOT DETECTED NOT DETECTED Final   Streptococcus pneumoniae NOT DETECTED NOT DETECTED Final   Streptococcus pyogenes NOT DETECTED NOT DETECTED Final   Acinetobacter baumannii NOT DETECTED NOT DETECTED Final   Enterobacteriaceae species DETECTED (A) NOT DETECTED Final    Comment: Enterobacteriaceae represent a large family of gram-negative bacteria, not a single organism. CRITICAL RESULT CALLED TO, READ BACK BY AND VERIFIED WITH: J. Scherrie November Pharm.D. 11:40 06/10/17 ([email or web address])    Enterobacter cloacae complex NOT DETECTED NOT DETECTED Final   Escherichia coli DETECTED (A) NOT DETECTED Final    Comment: CRITICAL RESULT CALLED TO, READ BACK BY AND VERIFIED WITH: J. Scherrie November Pharm.D. 11:40 06/10/17 ([email or web address])    Klebsiella oxytoca NOT DETECTED NOT DETECTED Final   Klebsiella pneumoniae NOT DETECTED NOT DETECTED Final   Proteus species NOT DETECTED NOT DETECTED Final   Serratia marcescens NOT DETECTED NOT DETECTED Final   Carbapenem resistance NOT DETECTED NOT DETECTED Final   Haemophilus influenzae NOT DETECTED NOT DETECTED Final   Neisseria meningitidis NOT DETECTED NOT DETECTED Final   Pseudomonas aeruginosa NOT DETECTED NOT DETECTED Final   Candida albicans NOT DETECTED NOT DETECTED Final   Candida glabrata NOT DETECTED NOT DETECTED Final   Candida krusei NOT DETECTED NOT DETECTED Final   Candida parapsilosis NOT DETECTED NOT DETECTED Final   Candida tropicalis NOT DETECTED NOT DETECTED Final  MRSA PCR Screening     Status: None   Collection Time: 06/09/17  9:24 PM  Result Value Ref Range Status   MRSA by PCR NEGATIVE NEGATIVE Final    Comment:        The GeneXpert MRSA Assay (FDA approved for NASAL specimens only), is one component of a comprehensive MRSA colonization surveillance program. It is not intended to diagnose MRSA infection nor to guide or monitor treatment for MRSA infections.      Studies: No results found.  Scheduled Meds: .  amitriptyline  20 mg Oral QHS  . amLODipine  5 mg Oral Daily  . aspirin EC  81 mg Oral Daily  . atorvastatin  80 mg Oral q1800  . docusate sodium  100 mg Oral BID  . gabapentin  100 mg Oral QHS  . mouth rinse  15 mL Mouth Rinse BID  . metoprolol tartrate  25 mg Oral BID  . mirabegron ER  50 mg Oral Daily  . sulfamethoxazole-trimethoprim  1 tablet Oral Q12H   Continuous Infusions: . sodium chloride 10 mL/hr at 06/11/17 2200  . heparin 1,550 Units/hr (06/12/17 1259)      Time spent: 25 minutes    Truett Mainland  [facility] Pager: [phone] 06/12/2017, 1:37 PM  LOS: 2 days

## 2017-06-12 NOTE — Progress Notes (Addendum)
3 Days Post-Op Subjective: No acute events over the past 24 hours.  Denies flank or chest pain.  Voiding w/o difficulty and denies dysuria or hematuria.  Renal function and leukocystosis improving.    Objective: Vital signs in last 24 hours: Temp:  [97.8 F (36.6 C)-98.8 F (37.1 C)] 98.8 F (37.1 C) (01/11 0600) Pulse Rate:  [67-70] 67 (01/11 0600) Resp:  [15-26] 20 (01/11 0600) BP: (135-155)/(57-80) 135/64 (01/11 0600) SpO2:  [84 %-100 %] 99 % (01/11 0600)  Intake/Output from previous day: 01/10 0701 - 01/11 0700 In: 8099 [P.O.:120; I.V.:1461] Out: 525 [Urine:525]  Intake/Output this shift: Total I/O In: 120 [P.O.:120] Out: -   Physical Exam:  General: Alert and oriented CV: RRR, palpable distal pulses Lungs: CTAB, equal chest rise Abdomen: Soft, NTND, no rebound or guarding Ext: NT, No erythema  Lab Results: Recent Labs    06/10/17 0515 06/11/17 0454 06/12/17 0223  HGB 9.9* 10.6* 10.6*  HCT 30.1* 32.7* 32.0*   BMET Recent Labs    06/11/17 0454 06/12/17 0223  NA 141 139  K 4.4 3.9  CL 111 111  CO2 23 20*  GLUCOSE 95 90  BUN 41* 23*  CREATININE 1.22* 0.91  CALCIUM 8.2* 8.4*     Studies/Results: No results found.  Assessment/Plan: 1.  POD 3 s/p left JJ stent for an obstructing left ureteral stone- creatinine improving.  2.  Urosepsis 2/2 E. Coli UTI and bacteremia- Will convert to PO Vantin (per pharmacy's recommendations) for a total of 14 days of abx coverage.  Will address her ureteral stone once she has completed her course of abx 3.  NSTEMI- cardiology planning on cath Monday 4.  Acute respiratory failure- extubated yesterday and tolerating [place].  -Will continue to monitor   LOS: 2 days   Ellison Hughs, MD 06/12/2017, 1:07 PM  [facility] Pager: [phone]

## 2017-06-13 LAB — CBC
HEMATOCRIT: 30.2 % — AB (ref 36.0–46.0)
Hemoglobin: 9.9 g/dL — ABNORMAL LOW (ref 12.0–15.0)
MCH: 30 pg (ref 26.0–34.0)
MCHC: 32.8 g/dL (ref 30.0–36.0)
MCV: 91.5 fL (ref 78.0–100.0)
Platelets: 213 10*3/uL (ref 150–400)
RBC: 3.3 MIL/uL — AB (ref 3.87–5.11)
RDW: 14.6 % (ref 11.5–15.5)
WBC: 11.7 10*3/uL — AB (ref 4.0–10.5)

## 2017-06-13 LAB — HEPARIN LEVEL (UNFRACTIONATED): Heparin Unfractionated: 0.42 IU/mL (ref 0.30–0.70)

## 2017-06-13 NOTE — Progress Notes (Signed)
4 Days Post-Op Subjective: No acute events over the past 24 hours.  Denies flank or chest pain.  Voiding w/o difficulty and denies dysuria or hematuria.  Leukocytosis improving, renal function normalized today.    Objective: Vital signs in last 24 hours: Temp:  [98.4 F (36.9 C)-99.3 F (37.4 C)] 99.3 F (37.4 C) (01/12 0526) Pulse Rate:  [71-75] 74 (01/12 0932) Resp:  [18-20] 18 (01/12 0526) BP: (117-146)/(58-87) 117/78 (01/12 0932) SpO2:  [94 %-99 %] 98 % (01/12 0932)  Intake/Output from previous day: 01/11 0701 - 01/12 0700 In: 761.5 [P.O.:120; I.V.:641.5] Out: 600 [Urine:600]  Intake/Output this shift: Total I/O In: -  Out: 400 [Urine:400]  Physical Exam:  General: Alert and oriented CV: RRR, palpable distal pulses Lungs: CTAB, equal chest rise Abdomen: Soft, NTND, no rebound or guarding Ext: NT, No erythema  Lab Results: Recent Labs    06/11/17 0454 06/12/17 0223 06/13/17 0426  HGB 10.6* 10.6* 9.9*  HCT 32.7* 32.0* 30.2*   BMET Recent Labs    06/11/17 0454 06/12/17 0223  NA 141 139  K 4.4 3.9  CL 111 111  CO2 23 20*  GLUCOSE 95 90  BUN 41* 23*  CREATININE 1.22* 0.91  CALCIUM 8.2* 8.4*     Studies/Results: No results found.  Assessment/Plan: 1.   s/p left JJ stent for an obstructing left ureteral stone 1/8 - creatine now normalized 2.  Urosepsis 2/2 E. Coli UTI and bacteremia- on cefpodoxime (per pharmacy's recommendations) for a total of 14 days of abx coverage (1/11 - 1/25) .  Will address her ureteral stone once she has completed her course of abx 3.  NSTEMI- cardiology planning on cath Monday, cardiology following. On heparin drip.  4.  Acute respiratory failure- extubated and tolerating [place].   -Will continue to monitor   LOS: 3 days   Ellison Hughs, MD 06/13/2017, 11:18 AM  [facility] Pager: [phone]

## 2017-06-13 NOTE — Progress Notes (Signed)
ANTICOAGULATION CONSULT NOTE - Follow Up Consult  Pharmacy Consult for heparin Indication: chest pain/ACS  Allergies  Allergen Reactions  . Ciprofloxacin Hives  . Codeine Hives  . Demerol [Meperidine] Hives  . Other Hives    Hydrogen peroxide  . Ciprofloxacin Hcl   . Darvon [Propoxyphene]     Other reaction(s): OTHER  . Hydrogen Peroxide     White blisters  . Tramadol     "ITCHY ON THE INSIDE"    Patient Measurements: Height: 5\' 1"  (154.9 cm) Weight: 166 lb 9.6 oz (75.6 kg) IBW/kg (Calculated) : 47.8   Vital Signs: Temp: 99.3 F (37.4 C) (01/12 0526) Temp Source: Oral (01/12 0526) BP: 146/58 (01/12 0526) Pulse Rate: 75 (01/12 0526)  Labs: Recent Labs    06/10/17 1006 06/10/17 1526  06/10/17 2149  06/11/17 0454  06/12/17 0223 06/12/17 1132 06/12/17 2055 06/13/17 0426  HGB  --   --   --   --    < > 10.6*  --  10.6*  --   --  9.9*  HCT  --   --   --   --   --  32.7*  --  32.0*  --   --  30.2*  PLT  --   --   --   --   --  176  --  178  --   --  213  HEPARINUNFRC  --   --    < >  --   --  <0.10*   < > 0.27* 0.22* 0.35 0.42  CREATININE  --   --   --   --   --  1.22*  --  0.91  --   --   --   TROPONINI 9.77* 7.57*  --  6.35*  --   --   --   --   --   --   --    < > = values in this interval not displayed.    Estimated Creatinine Clearance: 46.6 mL/min (by C-G formula based on SCr of 0.91 mg/dL).   Medical History: Past Medical History:  Diagnosis Date  . Allergic rhinitis   . Chronic chest pain    and DOE w normal Stress cardiolyte  . Diverticulosis   . Heart murmur    Systolic heart murmur with Aortic valve sclerosis by ECHO  . Hyperlipidemia   . Hypertension   . Osteoarthritis    Erosive OA-MRI of R hand negative for synovitis, only OA-Dr Trudie Reed  . Osteopenia   . Pelvic prolapse   . PVC's (premature ventricular contractions)     Assessment: 29 YOF presenting for L ureteral stent for L ureteral calculus on 1/8. She developed SOB in PACU requiring  intubation.  Troponin was elevated on 1/9 with heparin started for NSTEMI.  Cardiology team recommends cardiac cath procedure on 1/14.   Today, 06/13/2017: - Heparin level therapeutic on 1550 units/hr - H/H low but relatively stable.  Pltc WNL - no bleeding documented  Goal of Therapy:  Heparin level 0.3-0.7 units/ml Monitor platelets by anticoagulation protocol: Yes   Plan:  - Continue Heparin at 1550 units/hr - Daily heparin level, CBC - Monitor for any reports of bleeding - Plans noted for cardiac cath on Mon 1/14  Clayburn Pert, PharmD, BCPS Pager: [phone] 06/13/2017  8:58 AM

## 2017-06-13 NOTE — Progress Notes (Signed)
Progress Note  Patient Name: Jacqueline Burke Date of Encounter: 06/13/2017  Primary Cardiologist: Fransico Him, MD   Subjective   Breathing is OK  NO CP     Inpatient Medications    Scheduled Meds: . amitriptyline  20 mg Oral QHS  . amLODipine  5 mg Oral Daily  . aspirin EC  81 mg Oral Daily  . atorvastatin  80 mg Oral q1800  . cefpodoxime  200 mg Oral Q12H  . docusate sodium  100 mg Oral BID  . gabapentin  100 mg Oral QHS  . mouth rinse  15 mL Mouth Rinse BID  . metoprolol tartrate  25 mg Oral BID  . mirabegron ER  50 mg Oral Daily   Continuous Infusions: . sodium chloride 10 mL/hr at 06/11/17 2200  . heparin 1,550 Units/hr (06/13/17 0526)   PRN Meds: acetaminophen, diphenhydrAMINE **OR** diphenhydrAMINE, fentaNYL (SUBLIMAZE) injection, HYDROcodone-acetaminophen, morphine injection, neomycin-bacitracin-polymyxin, ondansetron, opium-belladonna, oxybutynin   Vital Signs    Vitals:   06/12/17 1439 06/12/17 2146 06/13/17 0526 06/13/17 0932  BP: 119/87 (!) 136/59 (!) 146/58 117/78  Pulse: 71 71 75 74  Resp: 20 20 18    Temp: 98.4 F (36.9 C) 98.6 F (37 C) 99.3 F (37.4 C)   TempSrc: Oral Oral Oral   SpO2: 98% 99% 94% 98%  Weight:      Height:        Intake/Output Summary (Last 24 hours) at 06/13/2017 1151 Last data filed at 06/13/2017 0938 Gross per 24 hour  Intake 601.53 ml  Output 700 ml  Net -98.47 ml  \ Net 5.3 L positive   Filed Weights   06/09/17 1212  Weight: 166 lb 9.6 oz (75.6 kg)    Telemetry    SR   Personally Reviewed   Physical Exam   GEN: No acute distress.   Neck: No JVD Cardiac: RRR, no murmurs, rubs, or gallops.  Respiratory: Clear to auscultation bilaterally. GI: Soft, nontender, non-distended  MS: Triv edema Neuro:  Nonfocal  Psych: Normal affect   Labs    Chemistry Recent Labs  Lab 06/10/17 0515 06/11/17 0454 06/12/17 0223  NA 138 141 139  K 4.1 4.4 3.9  CL 108 111 111  CO2 22 23 20*  GLUCOSE 134* 95 90  BUN  41* 41* 23*  CREATININE 1.69* 1.22* 0.91  CALCIUM 8.1* 8.2* 8.4*  GFRNONAA 28* 41* 58*  GFRAA 32* 48* >60  ANIONGAP 8 7 8      Hematology Recent Labs  Lab 06/11/17 0454 06/12/17 0223 06/13/17 0426  WBC 20.6* 13.9* 11.7*  RBC 3.49* 3.47* 3.30*  HGB 10.6* 10.6* 9.9*  HCT 32.7* 32.0* 30.2*  MCV 93.7 92.2 91.5  MCH 30.4 30.5 30.0  MCHC 32.4 33.1 32.8  RDW 14.9 14.7 14.6  PLT 176 178 213    Cardiac Enzymes Recent Labs  Lab 06/09/17 1806 06/10/17 1006 06/10/17 1526 06/10/17 2149  TROPONINI 19.37* 9.77* 7.57* 6.35*      Patient Profile     82 year old female with past medical history of hypertension admitted with left ureteral stone and UTI for evaluation of elevation myocardial infarction.  Patient admitted with abdominal pain and found to have a left ureteral stone.  She had left ureteral stent placed.  Following extubation in the PACU she developed dyspnea and tachycardia requiring sedation.  Cardiac markers elevated.  Cardiology asked to evaluate.      Assessment & Plan    1 non-ST elevation myocardial infarction-continue aspirin, heparin, statin  and metoprolol. Plan cardiac catheterization on Monday. The risks and benefit including MI, CVA and death were discussed she agrees to proceed.    2 ureteral stone status post stent and UTI-management per urology.  Continue antibiotics.  3 hypertension-BP is OK   4 acute kidney injury-Follow post cath  [facility] prior    For questions or updates, please contact [place] Please consult [email or web address] for contact info under Cardiology/STEMI.      Signed, Dorris Carnes, MD  06/13/2017, 11:51 AM

## 2017-06-13 NOTE — Progress Notes (Signed)
[facility] PROGRESS NOTE  JAVIER MAMONE QQP:[id number] DOB: 1935-08-28 DOA: 06/09/2017 PCP: Darcus Austin, MD  Brief Summary: 82 year old female who was taken to the OR due to kidney stone flank pain back pain, nausea, vomiting.  Patient had a cystoscopy with pyelogram and a left ureteral stent placement with stone removal.  The procedure was uneventful.  After intubation the patient developed tachycardia, anxiety, increasing shortness of breath.  She was reintubated due to respiratory failure.  Patient was admitted to ICU.  Troponins were drawn as part of her workup, and she was found to have an NSTEMI.  Heparin was started and cardiology was consulted on 1/9.  The patient was successfully extubated on 1/9 the patient was transferred out of the ICU on the following day.  Hospitalists were asked to assume care of the patient.  Assessment/Plan: Active Problems:   Ureteral calculus   Chronic chest pain   Acute respiratory failure with hypoxemia (HCC)   E coli bacteremia   Pyelonephritis   NSTEMI (non-ST elevated myocardial infarction) ([place])   1. Sepsis  Resolved 2. Acute respiratory failure with hypoxemia  Resolved 3. E Coli Bacteremia  Resolving  Patient transition to Cefpodoxime to end on 1/25 4. Pyelonephritis 5. Ureteral Calculus  Ureteral stent placed -Ditropan for bladder spasms  Urology to address stone following completion of antibiotics 6. NSTEMI  Patient continues on heparin  Per cardiology, plans for cath on Monday  Beta-blocker, baby aspirin, Norvasc, Lipitor high-dose  Code Status: Full code Family Communication: Husband in the room Disposition Plan: Pending   Consultants:  Cardiology  Urology  Procedures:  Ureteral stent placement on 1/8  Antibiotics:  Bactrim  HPI/Subjective: Patient without complaints this morning.  Has significant urine output.  Denies fevers, chills, nausea, vomiting.  Objective: Vitals:   06/13/17 0526 06/13/17  0932  BP: (!) 146/58 117/78  Pulse: 75 74  Resp: 18   Temp: 99.3 F (37.4 C)   SpO2: 94% 98%    Intake/Output Summary (Last 24 hours) at 06/13/2017 1127 Last data filed at 06/13/2017 7124 Gross per 24 hour  Intake 601.53 ml  Output 700 ml  Net -98.47 ml   Filed Weights   06/09/17 1212  Weight: 75.6 kg (166 lb 9.6 oz)    Exam:   General: No acute distress.  Awake, alert, oriented x3.  Cardiovascular: Regular rate with normal S1/S2 sounds.  No murmurs.    Respiratory: Patient breathing comfortably.  Normal respiratory effort.  Clear to auscultation bilaterally with no wheezes, rales, rhonchi  Abdomen: Abdomen is soft, nontender, nondistended   Musculoskeletal: Normal range of motion with movements without difficulty  Extremity: Trace pedal edema.  2+ pulses.  Warm extremities.  No erythema  Data Reviewed: Basic Metabolic Panel: Recent Labs  Lab 06/07/17 1540 06/09/17 1517 06/09/17 1656 06/10/17 0515 06/11/17 0454 06/12/17 0223  NA 138 137 135 138 141 139  K 4.6 4.2 3.9 4.1 4.4 3.9  CL 103 103  --  108 111 111  CO2 24 22  --  22 23 20*  GLUCOSE 137* 106*  --  134* 95 90  BUN 18 49*  --  41* 41* 23*  CREATININE 1.09* 2.28*  --  1.69* 1.22* 0.91  CALCIUM 9.8 8.7*  --  8.1* 8.2* 8.4*   Liver Function Tests: No results for input(s): AST, ALT, ALKPHOS, BILITOT, PROT, ALBUMIN in the last 168 hours. No results for input(s): LIPASE, AMYLASE in the last 168 hours. No results for input(s): AMMONIA in  the last 168 hours. CBC: Recent Labs  Lab 06/09/17 1517 06/09/17 1656 06/10/17 0515 06/11/17 0454 06/12/17 0223 06/13/17 0426  WBC 3.9*  --  17.5* 20.6* 13.9* 11.7*  NEUTROABS 2.7  --   --   --   --   --   HGB 11.4* 10.2* 9.9* 10.6* 10.6* 9.9*  HCT 36.5 30.0* 30.1* 32.7* 32.0* 30.2*  MCV 94.1  --  92.6 93.7 92.2 91.5  PLT 178  --  147* 176 178 213   Cardiac Enzymes: Recent Labs  Lab 06/09/17 1806 06/10/17 1006 06/10/17 1526 06/10/17 2149  TROPONINI  19.37* 9.77* 7.57* 6.35*   BNP (last 3 results) No results for input(s): BNP in the last 8760 hours.  ProBNP (last 3 results) No results for input(s): PROBNP in the last 8760 hours.  CBG: No results for input(s): GLUCAP in the last 168 hours.  Recent Results (from the past 240 hour(s))  Urine culture     Status: Abnormal   Collection Time: 06/07/17  7:51 PM  Result Value Ref Range Status   Specimen Description URINE, RANDOM  Final   Special Requests NONE  Final   Culture 20,000 COLONIES/mL ESCHERICHIA COLI (A)  Final   Report Status 06/10/2017 FINAL  Final   Organism ID, Bacteria ESCHERICHIA COLI (A)  Final      Susceptibility   Escherichia coli - MIC*    AMPICILLIN >=32 RESISTANT Resistant     CEFAZOLIN >=64 RESISTANT Resistant     CEFTRIAXONE <=1 SENSITIVE Sensitive     CIPROFLOXACIN <=0.25 SENSITIVE Sensitive     GENTAMICIN <=1 SENSITIVE Sensitive     IMIPENEM <=0.25 SENSITIVE Sensitive     NITROFURANTOIN <=16 SENSITIVE Sensitive     TRIMETH/SULFA <=20 SENSITIVE Sensitive     AMPICILLIN/SULBACTAM >=32 RESISTANT Resistant     PIP/TAZO <=4 SENSITIVE Sensitive     Extended ESBL NEGATIVE Sensitive     * 20,000 COLONIES/mL ESCHERICHIA COLI  Culture, Urine     Status: Abnormal   Collection Time: 06/09/17  3:26 PM  Result Value Ref Range Status   Specimen Description URINE, CATHETERIZED  Final   Special Requests NONE  Final   Culture >=100,000 COLONIES/mL ESCHERICHIA COLI (A)  Final   Report Status 06/11/2017 FINAL  Final   Organism ID, Bacteria ESCHERICHIA COLI (A)  Final      Susceptibility   Escherichia coli - MIC*    AMPICILLIN >=32 RESISTANT Resistant     CEFAZOLIN >=64 RESISTANT Resistant     CEFTRIAXONE <=1 SENSITIVE Sensitive     CIPROFLOXACIN <=0.25 SENSITIVE Sensitive     GENTAMICIN <=1 SENSITIVE Sensitive     IMIPENEM <=0.25 SENSITIVE Sensitive     NITROFURANTOIN <=16 SENSITIVE Sensitive     TRIMETH/SULFA <=20 SENSITIVE Sensitive     AMPICILLIN/SULBACTAM  >=32 RESISTANT Resistant     PIP/TAZO <=4 SENSITIVE Sensitive     Extended ESBL NEGATIVE Sensitive     * >=100,000 COLONIES/mL ESCHERICHIA COLI  Culture, blood (x 2)     Status: None (Preliminary result)   Collection Time: 06/09/17  6:06 PM  Result Value Ref Range Status   Specimen Description BLOOD CENTRAL LINE  Final   Special Requests   Final    BOTTLES DRAWN AEROBIC ONLY Blood Culture adequate volume   Culture   Final    NO GROWTH 3 DAYS Performed at [facility], [address], [place],  [address]    Report Status PENDING  Incomplete  Culture, blood (x  2)     Status: Abnormal   Collection Time: 06/09/17  6:29 PM  Result Value Ref Range Status   Specimen Description BLOOD RIGHT HAND  Final   Special Requests IN PEDIATRIC BOTTLE Blood Culture adequate volume  Final   Culture  Setup Time   Final    GRAM NEGATIVE RODS IN PEDIATRIC BOTTLE CRITICAL RESULT CALLED TO, READ BACK BY AND VERIFIED WITH: J. Scherrie November Pharm.D. 11:40 06/10/17 ([email or web address]) Performed at [facility], [place] [address] [place], [place] [address]    Culture ESCHERICHIA COLI (A)  Final   Report Status 06/12/2017 FINAL  Final   Organism ID, Bacteria ESCHERICHIA COLI  Final      Susceptibility   Escherichia coli - MIC*    AMPICILLIN >=32 RESISTANT Resistant     CEFAZOLIN >=64 RESISTANT Resistant     CEFEPIME <=1 SENSITIVE Sensitive     CEFTAZIDIME <=1 SENSITIVE Sensitive     CEFTRIAXONE <=1 SENSITIVE Sensitive     CIPROFLOXACIN <=0.25 SENSITIVE Sensitive     GENTAMICIN <=1 SENSITIVE Sensitive     IMIPENEM <=0.25 SENSITIVE Sensitive     TRIMETH/SULFA <=20 SENSITIVE Sensitive     AMPICILLIN/SULBACTAM >=32 RESISTANT Resistant     PIP/TAZO <=4 SENSITIVE Sensitive     Extended ESBL NEGATIVE Sensitive     * ESCHERICHIA COLI  Blood Culture ID Panel (Reflexed)     Status: Abnormal   Collection Time: 06/09/17  6:29 PM  Result Value Ref Range Status   Enterococcus species NOT DETECTED NOT DETECTED Final    Listeria monocytogenes NOT DETECTED NOT DETECTED Final   Staphylococcus species NOT DETECTED NOT DETECTED Final   Staphylococcus aureus NOT DETECTED NOT DETECTED Final   Streptococcus species NOT DETECTED NOT DETECTED Final   Streptococcus agalactiae NOT DETECTED NOT DETECTED Final   Streptococcus pneumoniae NOT DETECTED NOT DETECTED Final   Streptococcus pyogenes NOT DETECTED NOT DETECTED Final   Acinetobacter baumannii NOT DETECTED NOT DETECTED Final   Enterobacteriaceae species DETECTED (A) NOT DETECTED Final    Comment: Enterobacteriaceae represent a large family of gram-negative bacteria, not a single organism. CRITICAL RESULT CALLED TO, READ BACK BY AND VERIFIED WITH: J. Scherrie November Pharm.D. 11:40 06/10/17 ([email or web address])    Enterobacter cloacae complex NOT DETECTED NOT DETECTED Final   Escherichia coli DETECTED (A) NOT DETECTED Final    Comment: CRITICAL RESULT CALLED TO, READ BACK BY AND VERIFIED WITH: J. Scherrie November Pharm.D. 11:40 06/10/17 ([email or web address])    Klebsiella oxytoca NOT DETECTED NOT DETECTED Final   Klebsiella pneumoniae NOT DETECTED NOT DETECTED Final   Proteus species NOT DETECTED NOT DETECTED Final   Serratia marcescens NOT DETECTED NOT DETECTED Final   Carbapenem resistance NOT DETECTED NOT DETECTED Final   Haemophilus influenzae NOT DETECTED NOT DETECTED Final   Neisseria meningitidis NOT DETECTED NOT DETECTED Final   Pseudomonas aeruginosa NOT DETECTED NOT DETECTED Final   Candida albicans NOT DETECTED NOT DETECTED Final   Candida glabrata NOT DETECTED NOT DETECTED Final   Candida krusei NOT DETECTED NOT DETECTED Final   Candida parapsilosis NOT DETECTED NOT DETECTED Final   Candida tropicalis NOT DETECTED NOT DETECTED Final  MRSA PCR Screening     Status: None   Collection Time: 06/09/17  9:24 PM  Result Value Ref Range Status   MRSA by PCR NEGATIVE NEGATIVE Final    Comment:        The GeneXpert MRSA Assay (FDA approved for NASAL specimens only), is one component of  a comprehensive MRSA colonization surveillance  program. It is not intended to diagnose MRSA infection nor to guide or monitor treatment for MRSA infections.      Studies: No results found.  Scheduled Meds: . amitriptyline  20 mg Oral QHS  . amLODipine  5 mg Oral Daily  . aspirin EC  81 mg Oral Daily  . atorvastatin  80 mg Oral q1800  . cefpodoxime  200 mg Oral Q12H  . docusate sodium  100 mg Oral BID  . gabapentin  100 mg Oral QHS  . mouth rinse  15 mL Mouth Rinse BID  . metoprolol tartrate  25 mg Oral BID  . mirabegron ER  50 mg Oral Daily   Continuous Infusions: . sodium chloride 10 mL/hr at 06/11/17 2200  . heparin 1,550 Units/hr (06/13/17 0526)      Time spent: 25 minutes    Truett Mainland  [facility] Pager: [phone] 06/13/2017, 11:27 AM  LOS: 3 days

## 2017-06-13 NOTE — Plan of Care (Signed)
Patient without complaint on 7 a to 7 p shift, able to wean to room air and maintain oxygen saturation in the mid to high 90's.  Awaiting cath Monday at 3 p.m. Family at bedside.

## 2017-06-14 LAB — CBC
HCT: 32 % — ABNORMAL LOW (ref 36.0–46.0)
Hemoglobin: 10.6 g/dL — ABNORMAL LOW (ref 12.0–15.0)
MCH: 30.5 pg (ref 26.0–34.0)
MCHC: 33.1 g/dL (ref 30.0–36.0)
MCV: 92.2 fL (ref 78.0–100.0)
PLATELETS: 222 10*3/uL (ref 150–400)
RBC: 3.47 MIL/uL — ABNORMAL LOW (ref 3.87–5.11)
RDW: 14.8 % (ref 11.5–15.5)
WBC: 10.1 10*3/uL (ref 4.0–10.5)

## 2017-06-14 LAB — C DIFFICILE QUICK SCREEN W PCR REFLEX
C Diff antigen: NEGATIVE
C Diff interpretation: NOT DETECTED
C Diff toxin: NEGATIVE

## 2017-06-14 LAB — CULTURE, BLOOD (ROUTINE X 2)
CULTURE: NO GROWTH
Special Requests: ADEQUATE

## 2017-06-14 LAB — HEPARIN LEVEL (UNFRACTIONATED)
HEPARIN UNFRACTIONATED: 0.15 [IU]/mL — AB (ref 0.30–0.70)
HEPARIN UNFRACTIONATED: 0.42 [IU]/mL (ref 0.30–0.70)

## 2017-06-14 LAB — BRAIN NATRIURETIC PEPTIDE: B Natriuretic Peptide: 326.1 pg/mL — ABNORMAL HIGH (ref 0.0–100.0)

## 2017-06-14 MED ORDER — LOPERAMIDE HCL 2 MG PO CAPS
2.0000 mg | ORAL_CAPSULE | ORAL | Status: DC | PRN
  Administered 2017-06-14: 2 mg via ORAL
  Filled 2017-06-14: qty 1

## 2017-06-14 MED ORDER — DEXTROSE-NACL 5-0.45 % IV SOLN
INTRAVENOUS | Status: DC
  Administered 2017-06-15: via INTRAVENOUS

## 2017-06-14 MED ORDER — SODIUM CHLORIDE 0.9 % IV SOLN
INTRAVENOUS | Status: DC
  Administered 2017-06-15: 06:00:00 via INTRAVENOUS

## 2017-06-14 MED ORDER — ASPIRIN 81 MG PO CHEW
81.0000 mg | CHEWABLE_TABLET | ORAL | Status: AC
  Administered 2017-06-15: 81 mg via ORAL
  Filled 2017-06-14: qty 1

## 2017-06-14 MED ORDER — SODIUM CHLORIDE 0.9% FLUSH: 3.0000 mL | INTRAVENOUS | Status: DC | PRN

## 2017-06-14 MED ORDER — SODIUM CHLORIDE 0.9% FLUSH
3.0000 mL | Freq: Two times a day (BID) | INTRAVENOUS | Status: DC
  Administered 2017-06-15 (×2): 3 mL via INTRAVENOUS

## 2017-06-14 MED ORDER — SODIUM CHLORIDE 0.9 % IV SOLN: 250.0000 mL | INTRAVENOUS | Status: DC | PRN

## 2017-06-14 NOTE — Progress Notes (Signed)
5 Days Post-Op Subjective: No acute events over the past 24 hours.  Denies flank or chest pain.  Voiding w/o difficulty and denies dysuria or hematuria.  Leukocytosis normalized today, renal function pending. Has questions about heart catheterization planned for tomorrow, would like to know more about procedure and possible outcomes. Having multiple loose but formed stools.   Objective: Vital signs in last 24 hours: Temp:  [98.3 F (36.8 C)-99.5 F (37.5 C)] 99.5 F (37.5 C) (01/13 0548) Pulse Rate:  [63-74] 72 (01/13 0548) Resp:  [20-22] 20 (01/13 0548) BP: (117-156)/(55-78) 156/74 (01/13 0548) SpO2:  [91 %-99 %] 91 % (01/13 0548)  Intake/Output from previous day: 01/12 0701 - 01/13 0700 In: 852 [P.O.:240; I.V.:612] Out: 1200 [Urine:1200]  Intake/Output this shift: No intake/output data recorded.  Physical Exam:  General: Alert and oriented CV: RRR, palpable distal pulses Lungs: CTAB, equal chest rise Abdomen: Soft, NTND, no rebound or guarding Ext: NT, No erythema  Lab Results: Recent Labs    06/12/17 0223 06/13/17 0426 06/14/17 0508  HGB 10.6* 9.9* 10.6*  HCT 32.0* 30.2* 32.0*   BMET Recent Labs    06/12/17 0223  NA 139  K 3.9  CL 111  CO2 20*  GLUCOSE 90  BUN 23*  CREATININE 0.91  CALCIUM 8.4*     Studies/Results: No results found.  Assessment/Plan: 1.   s/p left JJ stent for an obstructing left ureteral stone 1/8 - creatine now normalized 2.  Urosepsis 2/2 E. Coli UTI and bacteremia- on cefpodoxime (per pharmacy's recommendations) for a total of 14 days of abx coverage (1/11 - 1/25) .  Will address her ureteral stone once she has completed her course of abx 3.  NSTEMI- cardiology planning on cath Monday, cardiology following. On heparin drip. NPO at midnight in preparation for procedure.  4.  Acute respiratory failure- extubated and weaned to room air, resolved 5. Diarrhea - will continue to monitor. If becomes watery or doesn't improve off colace  today, will send for C Dif testing   -Will continue to monitor   LOS: 4 days   Jonna Clark, MD Urological Surgery Resident  [facility]

## 2017-06-14 NOTE — Progress Notes (Signed)
Progress Note  Patient Name: Jacqueline Burke Date of Encounter: 06/14/2017  Primary Cardiologist: Fransico Him, MD   Subjective   Pt denies CP  Breathing is OK    Inpatient Medications    Scheduled Meds: . amitriptyline  20 mg Oral QHS  . amLODipine  5 mg Oral Daily  . aspirin EC  81 mg Oral Daily  . atorvastatin  80 mg Oral q1800  . cefpodoxime  200 mg Oral Q12H  . docusate sodium  100 mg Oral BID  . gabapentin  100 mg Oral QHS  . mouth rinse  15 mL Mouth Rinse BID  . metoprolol tartrate  25 mg Oral BID  . mirabegron ER  50 mg Oral Daily   Continuous Infusions: . sodium chloride 10 mL/hr at 06/11/17 2200  . [START ON 06/15/2017] dextrose 5 % and 0.45% NaCl    . heparin 1,550 Units/hr (06/14/17 0326)   PRN Meds: acetaminophen, diphenhydrAMINE **OR** diphenhydrAMINE, fentaNYL (SUBLIMAZE) injection, HYDROcodone-acetaminophen, morphine injection, neomycin-bacitracin-polymyxin, ondansetron, opium-belladonna, oxybutynin   Vital Signs    Vitals:   06/13/17 1625 06/13/17 2042 06/14/17 0548 06/14/17 0912  BP:  139/62 (!) 156/74 125/68  Pulse:  74 72 72  Resp:  20 20   Temp:  98.4 F (36.9 C) 99.5 F (37.5 C)   TempSrc:  Oral Oral   SpO2: 93% 91% 91% 93%  Weight:      Height:        Intake/Output Summary (Last 24 hours) at 06/14/2017 0943 Last data filed at 06/14/2017 0600 Gross per 24 hour  Intake 852 ml  Output 800 ml  Net 52 ml  \ Net 5.3 L positive   Filed Weights   06/09/17 1212  Weight: 166 lb 9.6 oz (75.6 kg)    Telemetry     SR    Personally Reviewed   Physical Exam   GEN: No acute distress.   Neck: No JVD Cardiac: RRR, no murmurs, rubs, or gallops.  Respiratory: Clear to auscultation bilaterally. GI: Soft, nontender, non-distended  MS: Triv edema Neuro:  Nonfocal  Psych: Normal affect   Labs    Chemistry Recent Labs  Lab 06/10/17 0515 06/11/17 0454 06/12/17 0223  NA 138 141 139  K 4.1 4.4 3.9  CL 108 111 111  CO2 22 23 20*    GLUCOSE 134* 95 90  BUN 41* 41* 23*  CREATININE 1.69* 1.22* 0.91  CALCIUM 8.1* 8.2* 8.4*  GFRNONAA 28* 41* 58*  GFRAA 32* 48* >60  ANIONGAP 8 7 8      Hematology Recent Labs  Lab 06/12/17 0223 06/13/17 0426 06/14/17 0508  WBC 13.9* 11.7* 10.1  RBC 3.47* 3.30* 3.47*  HGB 10.6* 9.9* 10.6*  HCT 32.0* 30.2* 32.0*  MCV 92.2 91.5 92.2  MCH 30.5 30.0 30.5  MCHC 33.1 32.8 33.1  RDW 14.7 14.6 14.8  PLT 178 213 222    Cardiac Enzymes Recent Labs  Lab 06/09/17 1806 06/10/17 1006 06/10/17 1526 06/10/17 2149  TROPONINI 19.37* 9.77* 7.57* 6.35*      Patient Profile     82 year old female with past medical history of hypertension admitted with left ureteral stone and UTI for evaluation of elevation myocardial infarction.  Patient admitted with abdominal pain and found to have a left ureteral stone.  She had left ureteral stent placed.  Following extubation in the PACU she developed dyspnea and tachycardia requiring sedation.  Cardiac markers elevated.  Cardiology asked to evaluate.      Assessment &  Plan    1 non-ST elevation myocardial infarction-continue aspirin, heparin, statin and metoprolol. Plan cardiac catheterization on Monday. The risks and benefit including MI, CVA and death were discussed she agrees to proceed.    2 ureteral stone status post stent and UTI-management per urology.  Continue antibiotics.  No Pain    3 hypertension    BP control is fair   4 acute kidney injury  Cr 1/11 was 0.91    For questions or updates, please contact [place] Please consult [email or web address] for contact info under Cardiology/STEMI.      Signed, Dorris Carnes, MD  06/14/2017, 9:43 AM

## 2017-06-14 NOTE — Progress Notes (Signed)
[facility] PROGRESS NOTE  Jacqueline Burke OQH:[id number] DOB: Apr 03, 1936 DOA: 06/09/2017 PCP: Darcus Austin, MD  Brief Summary: 82 year old female who was taken to the OR due to kidney stone flank pain back pain, nausea, vomiting.  Patient had a cystoscopy with pyelogram and a left ureteral stent placement with stone removal.  The procedure was uneventful.  After intubation the patient developed tachycardia, anxiety, increasing shortness of breath.  She was reintubated due to respiratory failure.  Patient was admitted to ICU.  Troponins were drawn as part of her workup, and she was found to have an NSTEMI.  Heparin was started and cardiology was consulted on 1/9.  The patient was successfully extubated on 1/9 the patient was transferred out of the ICU on the following day.  Hospitalists were asked to assume care of the patient.  Assessment/Plan: Active Problems:   Ureteral calculus   Chronic chest pain   Acute respiratory failure with hypoxemia (HCC)   E coli bacteremia   Pyelonephritis   NSTEMI (non-ST elevated myocardial infarction) ([place])   1. Sepsis  Resolved 2. Acute respiratory failure with hypoxemia  Resolved 3. E Coli Bacteremia  Resolving  Patient transition to Cefpodoxime to end on 1/25 4. Pyelonephritis 5. Ureteral Calculus  Ureteral stent placed -Ditropan for bladder spasms  Urology to address stone following completion of antibiotics 6. NSTEMI  Patient continues on heparin  Per cardiology, plans for cath on Monday  Beta-blocker, baby aspirin, Norvasc, Lipitor high-dose  Code Status: Full code Family Communication: Husband in the room Disposition Plan: Pending   Consultants:  Cardiology  Urology  Procedures:  Ureteral stent placement on 1/8  Antibiotics:  Cefpodoxime  HPI/Subjective: Patient having mild loose stools. No other complaints this morning.  Has significant urine output.  Denies fevers, chills, nausea,  vomiting.  Objective: Vitals:   06/14/17 0548 06/14/17 0912  BP: (!) 156/74 125/68  Pulse: 72 72  Resp: 20   Temp: 99.5 F (37.5 C)   SpO2: 91% 93%    Intake/Output Summary (Last 24 hours) at 06/14/2017 1052 Last data filed at 06/14/2017 1000 Gross per 24 hour  Intake 1092 ml  Output 800 ml  Net 292 ml   Filed Weights   06/09/17 1212  Weight: 75.6 kg (166 lb 9.6 oz)    Exam:   General: NAD.  Awake, alert, oriented x3.  Cardiovascular: RR with normal S1/S2 sounds.  No murmurs.    Respiratory: Patient breathing comfortably.   Clear to auscultation bilaterally with no wheezes, rales, rhonchi. Normal respiratory effort.   Abdomen: Abdomen is soft, nontender, nondistended   Musculoskeletal: Normal range of motion with movements without difficulty  Extremity: Trace pedal edema.  2+ pulses.  Warm extremities.  No erythema  Data Reviewed: Basic Metabolic Panel: Recent Labs  Lab 06/07/17 1540 06/09/17 1517 06/09/17 1656 06/10/17 0515 06/11/17 0454 06/12/17 0223  NA 138 137 135 138 141 139  K 4.6 4.2 3.9 4.1 4.4 3.9  CL 103 103  --  108 111 111  CO2 24 22  --  22 23 20*  GLUCOSE 137* 106*  --  134* 95 90  BUN 18 49*  --  41* 41* 23*  CREATININE 1.09* 2.28*  --  1.69* 1.22* 0.91  CALCIUM 9.8 8.7*  --  8.1* 8.2* 8.4*   Liver Function Tests: No results for input(s): AST, ALT, ALKPHOS, BILITOT, PROT, ALBUMIN in the last 168 hours. No results for input(s): LIPASE, AMYLASE in the last 168 hours. No results for  input(s): AMMONIA in the last 168 hours. CBC: Recent Labs  Lab 06/09/17 1517  06/10/17 0515 06/11/17 0454 06/12/17 0223 06/13/17 0426 06/14/17 0508  WBC 3.9*  --  17.5* 20.6* 13.9* 11.7* 10.1  NEUTROABS 2.7  --   --   --   --   --   --   HGB 11.4*   < > 9.9* 10.6* 10.6* 9.9* 10.6*  HCT 36.5   < > 30.1* 32.7* 32.0* 30.2* 32.0*  MCV 94.1  --  92.6 93.7 92.2 91.5 92.2  PLT 178  --  147* 176 178 213 222   < > = values in this interval not displayed.    Cardiac Enzymes: Recent Labs  Lab 06/09/17 1806 06/10/17 1006 06/10/17 1526 06/10/17 2149  TROPONINI 19.37* 9.77* 7.57* 6.35*   BNP (last 3 results) Recent Labs    06/14/17 0508  BNP 326.1*    ProBNP (last 3 results) No results for input(s): PROBNP in the last 8760 hours.  CBG: No results for input(s): GLUCAP in the last 168 hours.  Recent Results (from the past 240 hour(s))  Urine culture     Status: Abnormal   Collection Time: 06/07/17  7:51 PM  Result Value Ref Range Status   Specimen Description URINE, RANDOM  Final   Special Requests NONE  Final   Culture 20,000 COLONIES/mL ESCHERICHIA COLI (A)  Final   Report Status 06/10/2017 FINAL  Final   Organism ID, Bacteria ESCHERICHIA COLI (A)  Final      Susceptibility   Escherichia coli - MIC*    AMPICILLIN >=32 RESISTANT Resistant     CEFAZOLIN >=64 RESISTANT Resistant     CEFTRIAXONE <=1 SENSITIVE Sensitive     CIPROFLOXACIN <=0.25 SENSITIVE Sensitive     GENTAMICIN <=1 SENSITIVE Sensitive     IMIPENEM <=0.25 SENSITIVE Sensitive     NITROFURANTOIN <=16 SENSITIVE Sensitive     TRIMETH/SULFA <=20 SENSITIVE Sensitive     AMPICILLIN/SULBACTAM >=32 RESISTANT Resistant     PIP/TAZO <=4 SENSITIVE Sensitive     Extended ESBL NEGATIVE Sensitive     * 20,000 COLONIES/mL ESCHERICHIA COLI  Culture, Urine     Status: Abnormal   Collection Time: 06/09/17  3:26 PM  Result Value Ref Range Status   Specimen Description URINE, CATHETERIZED  Final   Special Requests NONE  Final   Culture >=100,000 COLONIES/mL ESCHERICHIA COLI (A)  Final   Report Status 06/11/2017 FINAL  Final   Organism ID, Bacteria ESCHERICHIA COLI (A)  Final      Susceptibility   Escherichia coli - MIC*    AMPICILLIN >=32 RESISTANT Resistant     CEFAZOLIN >=64 RESISTANT Resistant     CEFTRIAXONE <=1 SENSITIVE Sensitive     CIPROFLOXACIN <=0.25 SENSITIVE Sensitive     GENTAMICIN <=1 SENSITIVE Sensitive     IMIPENEM <=0.25 SENSITIVE Sensitive      NITROFURANTOIN <=16 SENSITIVE Sensitive     TRIMETH/SULFA <=20 SENSITIVE Sensitive     AMPICILLIN/SULBACTAM >=32 RESISTANT Resistant     PIP/TAZO <=4 SENSITIVE Sensitive     Extended ESBL NEGATIVE Sensitive     * >=100,000 COLONIES/mL ESCHERICHIA COLI  Culture, blood (x 2)     Status: None (Preliminary result)   Collection Time: 06/09/17  6:06 PM  Result Value Ref Range Status   Specimen Description BLOOD CENTRAL LINE  Final   Special Requests   Final    BOTTLES DRAWN AEROBIC ONLY Blood Culture adequate volume   Culture   Final  NO GROWTH 4 DAYS Performed at [facility], [place] [address], [place], [place] [address]    Report Status PENDING  Incomplete  Culture, blood (x 2)     Status: Abnormal   Collection Time: 06/09/17  6:29 PM  Result Value Ref Range Status   Specimen Description BLOOD RIGHT HAND  Final   Special Requests IN PEDIATRIC BOTTLE Blood Culture adequate volume  Final   Culture  Setup Time   Final    GRAM NEGATIVE RODS IN PEDIATRIC BOTTLE CRITICAL RESULT CALLED TO, READ BACK BY AND VERIFIED WITH: J. Scherrie November Pharm.D. 11:40 06/10/17 ([email or web address]) Performed at [facility], [place] [address] [place], [place] [address]    Culture ESCHERICHIA COLI (A)  Final   Report Status 06/12/2017 FINAL  Final   Organism ID, Bacteria ESCHERICHIA COLI  Final      Susceptibility   Escherichia coli - MIC*    AMPICILLIN >=32 RESISTANT Resistant     CEFAZOLIN >=64 RESISTANT Resistant     CEFEPIME <=1 SENSITIVE Sensitive     CEFTAZIDIME <=1 SENSITIVE Sensitive     CEFTRIAXONE <=1 SENSITIVE Sensitive     CIPROFLOXACIN <=0.25 SENSITIVE Sensitive     GENTAMICIN <=1 SENSITIVE Sensitive     IMIPENEM <=0.25 SENSITIVE Sensitive     TRIMETH/SULFA <=20 SENSITIVE Sensitive     AMPICILLIN/SULBACTAM >=32 RESISTANT Resistant     PIP/TAZO <=4 SENSITIVE Sensitive     Extended ESBL NEGATIVE Sensitive     * ESCHERICHIA COLI  Blood Culture ID Panel (Reflexed)     Status: Abnormal   Collection  Time: 06/09/17  6:29 PM  Result Value Ref Range Status   Enterococcus species NOT DETECTED NOT DETECTED Final   Listeria monocytogenes NOT DETECTED NOT DETECTED Final   Staphylococcus species NOT DETECTED NOT DETECTED Final   Staphylococcus aureus NOT DETECTED NOT DETECTED Final   Streptococcus species NOT DETECTED NOT DETECTED Final   Streptococcus agalactiae NOT DETECTED NOT DETECTED Final   Streptococcus pneumoniae NOT DETECTED NOT DETECTED Final   Streptococcus pyogenes NOT DETECTED NOT DETECTED Final   Acinetobacter baumannii NOT DETECTED NOT DETECTED Final   Enterobacteriaceae species DETECTED (A) NOT DETECTED Final    Comment: Enterobacteriaceae represent a large family of gram-negative bacteria, not a single organism. CRITICAL RESULT CALLED TO, READ BACK BY AND VERIFIED WITH: J. Scherrie November Pharm.D. 11:40 06/10/17 ([email or web address])    Enterobacter cloacae complex NOT DETECTED NOT DETECTED Final   Escherichia coli DETECTED (A) NOT DETECTED Final    Comment: CRITICAL RESULT CALLED TO, READ BACK BY AND VERIFIED WITH: J. Scherrie November Pharm.D. 11:40 06/10/17 ([email or web address])    Klebsiella oxytoca NOT DETECTED NOT DETECTED Final   Klebsiella pneumoniae NOT DETECTED NOT DETECTED Final   Proteus species NOT DETECTED NOT DETECTED Final   Serratia marcescens NOT DETECTED NOT DETECTED Final   Carbapenem resistance NOT DETECTED NOT DETECTED Final   Haemophilus influenzae NOT DETECTED NOT DETECTED Final   Neisseria meningitidis NOT DETECTED NOT DETECTED Final   Pseudomonas aeruginosa NOT DETECTED NOT DETECTED Final   Candida albicans NOT DETECTED NOT DETECTED Final   Candida glabrata NOT DETECTED NOT DETECTED Final   Candida krusei NOT DETECTED NOT DETECTED Final   Candida parapsilosis NOT DETECTED NOT DETECTED Final   Candida tropicalis NOT DETECTED NOT DETECTED Final  MRSA PCR Screening     Status: None   Collection Time: 06/09/17  9:24 PM  Result Value Ref Range Status   MRSA by PCR NEGATIVE NEGATIVE Final  Comment:        The GeneXpert MRSA Assay (FDA approved for NASAL specimens only), is one component of a comprehensive MRSA colonization surveillance program. It is not intended to diagnose MRSA infection nor to guide or monitor treatment for MRSA infections.      Studies: No results found.  Scheduled Meds: . amitriptyline  20 mg Oral QHS  . amLODipine  5 mg Oral Daily  . aspirin EC  81 mg Oral Daily  . atorvastatin  80 mg Oral q1800  . cefpodoxime  200 mg Oral Q12H  . docusate sodium  100 mg Oral BID  . gabapentin  100 mg Oral QHS  . mouth rinse  15 mL Mouth Rinse BID  . metoprolol tartrate  25 mg Oral BID  . mirabegron ER  50 mg Oral Daily   Continuous Infusions: . sodium chloride 10 mL/hr at 06/11/17 2200  . [START ON 06/15/2017] dextrose 5 % and 0.45% NaCl    . heparin 1,550 Units/hr (06/14/17 0326)      Time spent: 25 minutes    Truett Mainland  [facility] Pager: [phone] 06/14/2017, 10:52 AM  LOS: 4 days

## 2017-06-14 NOTE — Progress Notes (Signed)
ANTICOAGULATION CONSULT NOTE - Follow Up Consult  Pharmacy Consult for heparin Indication: chest pain/ACS  Allergies  Allergen Reactions  . Ciprofloxacin Hives  . Codeine Hives  . Demerol [Meperidine] Hives  . Other Hives    Hydrogen peroxide  . Ciprofloxacin Hcl   . Darvon [Propoxyphene]     Other reaction(s): OTHER  . Hydrogen Peroxide     White blisters  . Tramadol     "ITCHY ON THE INSIDE"    Patient Measurements: Height: 5\' 1"  (154.9 cm) Weight: 166 lb 9.6 oz (75.6 kg) IBW/kg (Calculated) : 47.8   Vital Signs: Temp: 97.3 F (36.3 C) (01/13 1345) Temp Source: Oral (01/13 1345) BP: 121/80 (01/13 1345) Pulse Rate: 63 (01/13 1345)  Labs: Recent Labs    06/12/17 0223  06/13/17 0426 06/14/17 0508 06/14/17 1353  HGB 10.6*  --  9.9* 10.6*  --   HCT 32.0*  --  30.2* 32.0*  --   PLT 178  --  213 222  --   HEPARINUNFRC 0.27*   < > 0.42 0.15* 0.42  CREATININE 0.91  --   --   --   --    < > = values in this interval not displayed.    Estimated Creatinine Clearance: 46.6 mL/min (by C-G formula based on SCr of 0.91 mg/dL).   Medical History: Past Medical History:  Diagnosis Date  . Allergic rhinitis   . Chronic chest pain    and DOE w normal Stress cardiolyte  . Diverticulosis   . Heart murmur    Systolic heart murmur with Aortic valve sclerosis by ECHO  . Hyperlipidemia   . Hypertension   . Osteoarthritis    Erosive OA-MRI of R hand negative for synovitis, only OA-Dr Trudie Reed  . Osteopenia   . Pelvic prolapse   . PVC's (premature ventricular contractions)     Assessment: 48 YOF presenting for L ureteral stent for L ureteral calculus on 1/8. She developed SOB in PACU requiring intubation.  Troponin was elevated on 1/9 with heparin started for NSTEMI.  Cardiology team recommends cardiac cath procedure on 1/14.   Today, 06/14/2017: - Heparin level down substantially from yesterday, now subtherapeutic.   Had been therapeutic for the two previous readings on  current rate of 1550 units/hr - RN indicates no interruptions in the heparin infusion but reportedly the site is a bit puffy and there is uncertainty over whether infiltration may be occurring.  RN is moving IV site to a different location now. - Patient has reportedly had no chest pain overnight - H/H stable.  Pltc WNL - no bleeding documented  Follow-up 2:25 PM: - Heparin level now therapeutic on 1550 units/hr after IV moved to new site as outlined above. - Plans for cardiac cath tomorrow noted.   Goal of Therapy:  Heparin level 0.3-0.7 units/ml Monitor platelets by anticoagulation protocol: Yes   Plan:  -Continue heparin at present rate, 1550 units/hr -Daily heparin level and CBC -Orders per cardiology regarding when to hold heparin pre-procedure for cardiac cath  Clayburn Pert, PharmD, BCPS Pager: [phone] 06/14/2017  2:25 PM

## 2017-06-14 NOTE — Plan of Care (Signed)
Patient remains stable, no complaints of chest pain, VSS.  Patient has had multiple loose stools this shift (10-12) per patient, had some loose stools yesterday but worsened today.  MD notified, stool sent for C diff.  Patient signed consent for pending cardiac cath 06/15/17, patient has watched cath patient education video and is aware she is nothing by mouth after midnight for procedure.  Husband in to visit this shift.

## 2017-06-14 NOTE — Plan of Care (Signed)
  Progressing Clinical Measurements: Ability to maintain clinical measurements within normal limits will improve 06/14/2017 2216 - Progressing by Talbert Forest, RN Will remain free from infection 06/14/2017 2216 - Progressing by Talbert Forest, RN Diagnostic test results will improve 06/14/2017 2216 - Progressing by Talbert Forest, RN Respiratory complications will improve 06/14/2017 2216 - Progressing by Talbert Forest, RN Cardiovascular complication will be avoided 06/14/2017 2216 - Progressing by Talbert Forest, RN Elimination: Will not experience complications related to bowel motility 06/14/2017 2216 - Progressing by Talbert Forest, RN Will not experience complications related to urinary retention 06/14/2017 2216 - Progressing by Talbert Forest, RN

## 2017-06-14 NOTE — Progress Notes (Signed)
ANTICOAGULATION CONSULT NOTE - Follow Up Consult  Pharmacy Consult for heparin Indication: chest pain/ACS  Allergies  Allergen Reactions  . Ciprofloxacin Hives  . Codeine Hives  . Demerol [Meperidine] Hives  . Other Hives    Hydrogen peroxide  . Ciprofloxacin Hcl   . Darvon [Propoxyphene]     Other reaction(s): OTHER  . Hydrogen Peroxide     White blisters  . Tramadol     "ITCHY ON THE INSIDE"    Patient Measurements: Height: 5\' 1"  (154.9 cm) Weight: 166 lb 9.6 oz (75.6 kg) IBW/kg (Calculated) : 47.8   Vital Signs: Temp: 99.5 F (37.5 C) (01/13 0548) Temp Source: Oral (01/13 0548) BP: 156/74 (01/13 0548) Pulse Rate: 72 (01/13 0548)  Labs: Recent Labs    06/12/17 0223  06/12/17 2055 06/13/17 0426 06/14/17 0508  HGB 10.6*  --   --  9.9* 10.6*  HCT 32.0*  --   --  30.2* 32.0*  PLT 178  --   --  213 222  HEPARINUNFRC 0.27*   < > 0.35 0.42 0.15*  CREATININE 0.91  --   --   --   --    < > = values in this interval not displayed.    Estimated Creatinine Clearance: 46.6 mL/min (by C-G formula based on SCr of 0.91 mg/dL).   Medical History: Past Medical History:  Diagnosis Date  . Allergic rhinitis   . Chronic chest pain    and DOE w normal Stress cardiolyte  . Diverticulosis   . Heart murmur    Systolic heart murmur with Aortic valve sclerosis by ECHO  . Hyperlipidemia   . Hypertension   . Osteoarthritis    Erosive OA-MRI of R hand negative for synovitis, only OA-Dr Trudie Reed  . Osteopenia   . Pelvic prolapse   . PVC's (premature ventricular contractions)     Assessment: 52 YOF presenting for L ureteral stent for L ureteral calculus on 1/8. She developed SOB in PACU requiring intubation.  Troponin was elevated on 1/9 with heparin started for NSTEMI.  Cardiology team recommends cardiac cath procedure on 1/14.   Today, 06/14/2017: - Heparin level down substantially from yesterday, now subtherapeutic.   Had been therapeutic for the two previous readings on  current rate of 1550 units/hr - RN indicates no interruptions in the heparin infusion but reportedly the site is a bit puffy and there is uncertainty over whether infiltration may be occurring.  RN is moving IV site to a different location now. - Patient has reportedly had no chest pain overnight - H/H stable.  Pltc WNL - no bleeding documented  Goal of Therapy:  Heparin level 0.3-0.7 units/ml Monitor platelets by anticoagulation protocol: Yes   Plan:  - For now will continue Heparin at current rate, 1550 units/hr - Recheck heparin level at 2 pm (approximately 6 hours after infusion moved to new site) - Daily heparin level, CBC - Monitor for any reports of bleeding - Plans noted for cardiac cath on Mon 1/14  Clayburn Pert, PharmD, BCPS Pager: [phone] 06/14/2017  7:24 AM

## 2017-06-15 ENCOUNTER — Inpatient Hospital Stay (HOSPITAL_COMMUNITY)
Admission: RE | Disposition: A | Discharge status: Home / Self Care | Payer: Self-pay | Source: Ambulatory Visit | Attending: Urology

## 2017-06-15 HISTORY — PX: LEFT HEART CATH AND CORONARY ANGIOGRAPHY: CATH118249

## 2017-06-15 HISTORY — PX: CORONARY STENT INTERVENTION: CATH118234

## 2017-06-15 LAB — BASIC METABOLIC PANEL
Anion gap: 8 (ref 5–15)
BUN: 9 mg/dL (ref 6–20)
CO2: 25 mmol/L (ref 22–32)
Calcium: 8.6 mg/dL — ABNORMAL LOW (ref 8.9–10.3)
Chloride: 109 mmol/L (ref 101–111)
Creatinine, Ser: 0.78 mg/dL (ref 0.44–1.00)
GFR calc Af Amer: >60 mL/min (ref >60)
GLUCOSE: 116 mg/dL — AB (ref 65–99)
POTASSIUM: 3.4 mmol/L — AB (ref 3.5–5.1)
Sodium: 142 mmol/L (ref 135–145)

## 2017-06-15 LAB — PROTIME-INR
INR: 1.04
Prothrombin Time: 13.5 seconds (ref 11.4–15.2)

## 2017-06-15 LAB — CBC
HCT: 31.1 % — ABNORMAL LOW (ref 36.0–46.0)
HEMOGLOBIN: 9.9 g/dL — AB (ref 12.0–15.0)
MCH: 29.3 pg (ref 26.0–34.0)
MCHC: 31.8 g/dL (ref 30.0–36.0)
MCV: 92 fL (ref 78.0–100.0)
PLATELETS: 272 10*3/uL (ref 150–400)
RBC: 3.38 MIL/uL — AB (ref 3.87–5.11)
RDW: 14.6 % (ref 11.5–15.5)
WBC: 10.6 10*3/uL — ABNORMAL HIGH (ref 4.0–10.5)

## 2017-06-15 LAB — HEPARIN LEVEL (UNFRACTIONATED): Heparin Unfractionated: 0.53 IU/mL (ref 0.30–0.70)

## 2017-06-15 LAB — POCT ACTIVATED CLOTTING TIME: Activated Clotting Time: 378 seconds

## 2017-06-15 SURGERY — LEFT HEART CATH AND CORONARY ANGIOGRAPHY
Anesthesia: LOCAL

## 2017-06-15 MED ORDER — LABETALOL HCL 5 MG/ML IV SOLN
10.0000 mg | INTRAVENOUS | Status: AC | PRN
  Administered 2017-06-15: 17:00:00 10 mg via INTRAVENOUS
  Filled 2017-06-15: qty 4

## 2017-06-15 MED ORDER — IOPAMIDOL (ISOVUE-370) INJECTION 76%
INTRAVENOUS | Status: AC
  Filled 2017-06-15: qty 100

## 2017-06-15 MED ORDER — SODIUM CHLORIDE 0.9% FLUSH: 3.0000 mL | INTRAVENOUS | Status: DC | PRN

## 2017-06-15 MED ORDER — SODIUM CHLORIDE 0.9 % IV SOLN: 250.0000 mL | INTRAVENOUS | Status: DC | PRN

## 2017-06-15 MED ORDER — HEPARIN SODIUM (PORCINE) 1000 UNIT/ML IJ SOLN
INTRAMUSCULAR | Status: DC | PRN
  Administered 2017-06-15: 3500 [IU] via INTRAVENOUS

## 2017-06-15 MED ORDER — SODIUM CHLORIDE 0.9 % IV SOLN
INTRAVENOUS | Status: DC
  Administered 2017-06-15: via INTRAVENOUS

## 2017-06-15 MED ORDER — LIDOCAINE HCL (PF) 1 % IJ SOLN
INTRAMUSCULAR | Status: DC | PRN
  Administered 2017-06-15: 2 mL

## 2017-06-15 MED ORDER — SODIUM CHLORIDE 0.9 % WEIGHT BASED INFUSION: 1.0000 mL/kg/h | INTRAVENOUS | Status: DC

## 2017-06-15 MED ORDER — BIVALIRUDIN BOLUS VIA INFUSION - CUPID
INTRAVENOUS | Status: DC | PRN
  Administered 2017-06-15: 57.15 mg via INTRAVENOUS

## 2017-06-15 MED ORDER — HEPARIN (PORCINE) IN NACL 2-0.9 UNIT/ML-% IJ SOLN
INTRAMUSCULAR | Status: DC | PRN
  Administered 2017-06-15: 15:00:00

## 2017-06-15 MED ORDER — BIVALIRUDIN TRIFLUOROACETATE 250 MG IV SOLR
INTRAVENOUS | Status: AC
  Filled 2017-06-15: qty 250

## 2017-06-15 MED ORDER — ASPIRIN 81 MG PO CHEW: 81.0000 mg | CHEWABLE_TABLET | ORAL | Status: DC

## 2017-06-15 MED ORDER — HEPARIN SODIUM (PORCINE) 1000 UNIT/ML IJ SOLN
INTRAMUSCULAR | Status: AC
  Filled 2017-06-15: qty 1

## 2017-06-15 MED ORDER — ONDANSETRON HCL 4 MG/2ML IJ SOLN: 4.0000 mg | Freq: Four times a day (QID) | INTRAMUSCULAR | Status: DC | PRN

## 2017-06-15 MED ORDER — IOPAMIDOL (ISOVUE-370) INJECTION 76%
INTRAVENOUS | Status: AC
  Filled 2017-06-15: qty 50

## 2017-06-15 MED ORDER — HYDRALAZINE HCL 20 MG/ML IJ SOLN: 5.0000 mg | INTRAMUSCULAR | Status: AC | PRN

## 2017-06-15 MED ORDER — SODIUM CHLORIDE 0.9 % IV SOLN
INTRAVENOUS | Status: AC
  Administered 2017-06-15: 18:00:00 via INTRAVENOUS

## 2017-06-15 MED ORDER — HEPARIN (PORCINE) IN NACL 2-0.9 UNIT/ML-% IJ SOLN
INTRAMUSCULAR | Status: AC
  Filled 2017-06-15: qty 1000

## 2017-06-15 MED ORDER — SODIUM CHLORIDE 0.9 % IV SOLN
INTRAVENOUS | Status: AC | PRN
  Administered 2017-06-15: 1.75 mg/kg/h via INTRAVENOUS

## 2017-06-15 MED ORDER — IOPAMIDOL (ISOVUE-370) INJECTION 76%
INTRAVENOUS | Status: DC | PRN
  Administered 2017-06-15: 95 mL

## 2017-06-15 MED ORDER — TICAGRELOR 90 MG PO TABS
ORAL_TABLET | ORAL | Status: AC
  Filled 2017-06-15: qty 2

## 2017-06-15 MED ORDER — VERAPAMIL HCL 2.5 MG/ML IV SOLN
INTRAVENOUS | Status: DC | PRN
  Administered 2017-06-15: 10 mL via INTRA_ARTERIAL

## 2017-06-15 MED ORDER — NITROGLYCERIN 1 MG/10 ML FOR IR/CATH LAB
INTRA_ARTERIAL | Status: AC
  Filled 2017-06-15: qty 10

## 2017-06-15 MED ORDER — ACETAMINOPHEN 325 MG PO TABS: 650.0000 mg | ORAL_TABLET | ORAL | Status: DC | PRN

## 2017-06-15 MED ORDER — SODIUM CHLORIDE 0.9 % IV BOLUS (SEPSIS)
250.0000 mL | Freq: Once | INTRAVENOUS | Status: AC
  Administered 2017-06-15: 250 mL via INTRAVENOUS

## 2017-06-15 MED ORDER — SODIUM CHLORIDE 0.9 % WEIGHT BASED INFUSION: 3.0000 mL/kg/h | INTRAVENOUS | Status: DC

## 2017-06-15 MED ORDER — ASPIRIN 81 MG PO CHEW
81.0000 mg | CHEWABLE_TABLET | Freq: Every day | ORAL | Status: DC
  Administered 2017-06-16: 10:00:00 81 mg via ORAL
  Filled 2017-06-15: qty 1

## 2017-06-15 MED ORDER — SODIUM CHLORIDE 0.9 % IV SOLN
1.7500 mg/kg/h | INTRAVENOUS | Status: AC
  Administered 2017-06-15 (×2): 1.75 mg/kg/h via INTRAVENOUS
  Filled 2017-06-15 (×2): qty 250

## 2017-06-15 MED ORDER — VERAPAMIL HCL 2.5 MG/ML IV SOLN
INTRAVENOUS | Status: AC
  Filled 2017-06-15: qty 2

## 2017-06-15 MED ORDER — TICAGRELOR 90 MG PO TABS
ORAL_TABLET | ORAL | Status: DC | PRN
  Administered 2017-06-15: 180 mg via ORAL

## 2017-06-15 MED ORDER — TICAGRELOR 90 MG PO TABS
90.0000 mg | ORAL_TABLET | Freq: Two times a day (BID) | ORAL | Status: DC
  Administered 2017-06-16 (×2): 90 mg via ORAL
  Filled 2017-06-15 (×2): qty 1

## 2017-06-15 MED ORDER — ATORVASTATIN CALCIUM 80 MG PO TABS
80.0000 mg | ORAL_TABLET | Freq: Every day | ORAL | Status: DC
  Administered 2017-06-15: 80 mg via ORAL
  Filled 2017-06-15: qty 1

## 2017-06-15 MED ORDER — SODIUM CHLORIDE 0.9% FLUSH
3.0000 mL | Freq: Two times a day (BID) | INTRAVENOUS | Status: DC
  Administered 2017-06-15 – 2017-06-16 (×2): 3 mL via INTRAVENOUS

## 2017-06-15 MED ORDER — SODIUM CHLORIDE 0.9% FLUSH: 3.0000 mL | Freq: Two times a day (BID) | INTRAVENOUS | Status: DC

## 2017-06-15 MED ORDER — LIDOCAINE HCL (PF) 1 % IJ SOLN
INTRAMUSCULAR | Status: AC
  Filled 2017-06-15: qty 30

## 2017-06-15 MED ORDER — POTASSIUM CHLORIDE CRYS ER 20 MEQ PO TBCR
40.0000 meq | EXTENDED_RELEASE_TABLET | Freq: Once | ORAL | Status: AC
  Administered 2017-06-15: 40 meq via ORAL
  Filled 2017-06-15: qty 2

## 2017-06-15 SURGICAL SUPPLY — 19 items
BALLN SAPPHIRE 2.0X12 (BALLOONS) ×2
BALLN SAPPHIRE ~~LOC~~ 3.25X12 (BALLOONS) ×1 IMPLANT
BALLOON SAPPHIRE 2.0X12 (BALLOONS) IMPLANT
CATH INFINITI 5FR ANG PIGTAIL (CATHETERS) ×1 IMPLANT
CATH OPTITORQUE TIG 4.0 5F (CATHETERS) ×1 IMPLANT
CATH VISTA GUIDE 6FR JR4 (CATHETERS) ×1 IMPLANT
DEVICE RAD COMP TR BAND LRG (VASCULAR PRODUCTS) ×1 IMPLANT
GLIDESHEATH SLEND A-KIT 6F 22G (SHEATH) ×1 IMPLANT
GUIDEWIRE INQWIRE 1.5J.035X260 (WIRE) IMPLANT
INQWIRE 1.5J .035X260CM (WIRE) ×2
KIT ENCORE 26 ADVANTAGE (KITS) ×1 IMPLANT
KIT HEART LEFT (KITS) ×2 IMPLANT
PACK CARDIAC CATHETERIZATION (CUSTOM PROCEDURE TRAY) ×2 IMPLANT
STENT SYNERGY DES 3X16 (Permanent Stent) ×1 IMPLANT
SYR MEDRAD MARK V 150ML (SYRINGE) ×1 IMPLANT
TRANSDUCER W/STOPCOCK (MISCELLANEOUS) ×2 IMPLANT
TUBING CIL FLEX 10 FLL-RA (TUBING) ×2 IMPLANT
WIRE ASAHI PROWATER 180CM (WIRE) ×1 IMPLANT
WIRE HI TORQ VERSACORE-J 145CM (WIRE) ×1 IMPLANT

## 2017-06-15 NOTE — H&P (View-Only) (Signed)
Progress Note  Patient Name: Jacqueline Burke Date of Encounter: 06/15/2017  Primary Cardiologist: Dr. Radford Pax  Subjective   Patient feels great. No CP or SOB. No orthopnea or edema. Lying around 30 degrees in bed without dyspnea. Reports a lot of urination. Diarrhea has tapered off.  Inpatient Medications    Scheduled Meds: . amitriptyline  20 mg Oral QHS  . amLODipine  5 mg Oral Daily  . aspirin EC  81 mg Oral Daily  . atorvastatin  80 mg Oral q1800  . cefpodoxime  200 mg Oral Q12H  . docusate sodium  100 mg Oral BID  . gabapentin  100 mg Oral QHS  . mouth rinse  15 mL Mouth Rinse BID  . metoprolol tartrate  25 mg Oral BID  . mirabegron ER  50 mg Oral Daily  . potassium chloride  40 mEq Oral Once  . sodium chloride flush  3 mL Intravenous Q12H   Continuous Infusions: . sodium chloride Stopped (06/15/17 0557)  . sodium chloride    . sodium chloride 75 mL/hr at 06/15/17 0556  . dextrose 5 % and 0.45% NaCl Stopped (06/15/17 0557)  . heparin 1,550 Units/hr (06/14/17 1929)   PRN Meds: sodium chloride, acetaminophen, diphenhydrAMINE **OR** diphenhydrAMINE, fentaNYL (SUBLIMAZE) injection, HYDROcodone-acetaminophen, loperamide, morphine injection, neomycin-bacitracin-polymyxin, ondansetron, opium-belladonna, oxybutynin, sodium chloride flush   Vital Signs    Vitals:   06/14/17 1345 06/14/17 2149 06/14/17 2308 06/15/17 0557  BP: 121/80 (!) 159/68  (!) 143/57  Pulse: 63 72  65  Resp: 20 16  16   Temp: (!) 97.3 F (36.3 C) 99.1 F (37.3 C)  99.8 F (37.7 C)  TempSrc: Oral Oral  Oral  SpO2: 95% 93%  91%  Weight:   167 lb 15.9 oz (76.2 kg)   Height:        Intake/Output Summary (Last 24 hours) at 06/15/2017 0832 Last data filed at 06/15/2017 0600 Gross per 24 hour  Intake 1299.5 ml  Output -  Net 1299.5 ml   Filed Weights   06/09/17 1212 06/14/17 2308  Weight: 166 lb 9.6 oz (75.6 kg) 167 lb 15.9 oz (76.2 kg)    Telemetry    NSR - Personally Reviewed  Physical  Exam   GEN: No acute distress.  HEENT: Normocephalic, atraumatic, sclera non-icteric. Neck: No JVD or bruits. Cardiac: RRR no murmurs, rubs, or gallops.  Radials/DP/PT 1+ and equal bilaterally.  Respiratory: Clear to auscultation bilaterally. Breathing is unlabored. GI: Soft, nontender, non-distended, BS +x 4. MS: no deformity. Extremities: No clubbing or cyanosis. No edema. Distal pedal pulses are 2+ and equal bilaterally. Neuro:  AAOx3. Follows commands. Psych:  Responds to questions appropriately with a normal affect.  Labs    Chemistry Recent Labs  Lab 06/11/17 0454 06/12/17 0223 06/15/17 0502  NA 141 139 142  K 4.4 3.9 3.4*  CL 111 111 109  CO2 23 20* 25  GLUCOSE 95 90 116*  BUN 41* 23* 9  CREATININE 1.22* 0.91 0.78  CALCIUM 8.2* 8.4* 8.6*  GFRNONAA 41* 58* >60  GFRAA 48* >60 >60  ANIONGAP 7 8 8      Hematology Recent Labs  Lab 06/13/17 0426 06/14/17 0508 06/15/17 0502  WBC 11.7* 10.1 10.6*  RBC 3.30* 3.47* 3.38*  HGB 9.9* 10.6* 9.9*  HCT 30.2* 32.0* 31.1*  MCV 91.5 92.2 92.0  MCH 30.0 30.5 29.3  MCHC 32.8 33.1 31.8  RDW 14.6 14.8 14.6  PLT 213 222 272    Cardiac Enzymes Recent Labs  Lab 06/09/17 1806 06/10/17 1006 06/10/17 1526 06/10/17 2149  TROPONINI 19.37* 9.77* 7.57* 6.35*   No results for input(s): TROPIPOC in the last 168 hours.   BNP Recent Labs  Lab 06/14/17 0508  BNP 326.1*     DDimer No results for input(s): DDIMER in the last 168 hours.   Radiology    No results found.  Cardiac Studies   2D Echo 06/10/17 - Left ventricle: The estimated ejection fraction was 55%. Doppler   parameters are consistent with both elevated ventricular   end-diastolic filling pressure and elevated left atrial filling   pressure. - Mitral valve: Calcified annulus. Mildly thickened leaflets . - Left atrium: The atrium was mildly dilated. - Atrial septum: There was increased thickness of the septum,   consistent with lipomatous hypertrophy. No  defect or patent   foramen ovale was identified. - Pulmonary arteries: PA peak pressure: 34 mm Hg (S).  Patient Profile     82 y.o. female with past medical history of hypertension, asymptomatic PVCs, noncardiac chest pain was admitted with left ureteral stone and UTI. Underwent cystoscopy with left ureteral stent placement. Following extubation in the PACU she developed dyspnea/acute respiratory failure and tachycardia requiring sedation and re-intubation. Post-op course notable for E Coli bacteremia requiring antibiotics, acute kidney injury, and mild diarrhea. Cardiology following for NSTEMI with peak troponin of 19. 2D echo was above - normal LVEF, elevated LVEDP.   Assessment & Plan    1. Obstructing left ureteral stone s/p surgery 80/5/27, complicated by the above respiratory failure, UTI and E coli bacteremia, mild diarrhea - per primary teams. Urology note indicates eventual plan to address ureteral stone once she has completed her course of antibiotics. Diarrhea improved. Recheck temp 98.8 this AM.  2. NSTEMI - currently on board for cath today. EKGs earlier in admission certainly were concerning for evolving MI, but needed to address infectious issues first. Now much improved. Continue heparin, aspirin, beta blocker, statin. Suspect significant underlying CAD given the degree of troponin elevation. Will check baseline liver function with CMET in AM along with lipid panel. Risks and benefits of cardiac catheterization have been discussed with the patient.  These include bleeding, infection, kidney damage, stroke, heart attack, death.  The patient understands these risks and is willing to proceed. She watched video and feels well informed. Of note, echo showed elevated LVEDP and left atrial filling pressure, done shortly after IV fluid resuscitation for sepsis. She appears euvolemic today and does not have any orthopnea or dyspnea. Per cath lab nurse there were 2 sets of pre cath orders in; one  set was discontinued by cath lab. Agree with pre-cath fluids of 75/hr for now.  3. HTN - blood pressure mildly elevated today, diastolic on the lower side. Follow and consider titration of amlodipine.  4. Anemia - ? Related to critical illness during admission - hemoglobin relatively stable, follow. Further per IM. Place order for hemoccult as a precaution.  5. Acute kidney injury - resolved. Repleted K today for hypokalemia (3.4 -> 67meq po x 1).  For questions or updates, please contact [place] Please consult [email or web address] for contact info under Cardiology/STEMI.  Signed, Charlie Pitter, PA-C 06/15/2017, 8:32 AM    Patient examined chart reviewed. For cath today SEMI troponin 19 preserved EF. Post uretal stent and Rx For ecoli bacteremia and urosepsis Echo with preserved EF. Exam with clear lungs and no murmur no edema And good radial pulse Further recommendations based on diagnostic cath today  Collier Salina  [employer]

## 2017-06-15 NOTE — Progress Notes (Signed)
Notified by cath lab that K is 3.4. Chart reviewed, KCl 50meq po ordered x 1. Dayna Dunn PA-C

## 2017-06-15 NOTE — Care Management Important Message (Signed)
Important Message  Patient Details  Name: JESICCA DIPIERRO MRN: [id number] Date of Birth: 1936/04/02   Medicare Important Message Given:  Yes    Kerin Salen 06/15/2017, 12:00 [place] Message  Patient Details  Name: JUNE RODE MRN: [id number] Date of Birth: 20-Feb-1936   Medicare Important Message Given:  Yes    Kerin Salen 06/15/2017, 12:00 PM

## 2017-06-15 NOTE — Progress Notes (Signed)
Progress Note  Patient Name: Jacqueline Burke Date of Encounter: 06/15/2017  Primary Cardiologist: Dr. Radford Pax  Subjective   Patient feels great. No CP or SOB. No orthopnea or edema. Lying around 30 degrees in bed without dyspnea. Reports a lot of urination. Diarrhea has tapered off.  Inpatient Medications    Scheduled Meds: . amitriptyline  20 mg Oral QHS  . amLODipine  5 mg Oral Daily  . aspirin EC  81 mg Oral Daily  . atorvastatin  80 mg Oral q1800  . cefpodoxime  200 mg Oral Q12H  . docusate sodium  100 mg Oral BID  . gabapentin  100 mg Oral QHS  . mouth rinse  15 mL Mouth Rinse BID  . metoprolol tartrate  25 mg Oral BID  . mirabegron ER  50 mg Oral Daily  . potassium chloride  40 mEq Oral Once  . sodium chloride flush  3 mL Intravenous Q12H   Continuous Infusions: . sodium chloride Stopped (06/15/17 0557)  . sodium chloride    . sodium chloride 75 mL/hr at 06/15/17 0556  . dextrose 5 % and 0.45% NaCl Stopped (06/15/17 0557)  . heparin 1,550 Units/hr (06/14/17 1929)   PRN Meds: sodium chloride, acetaminophen, diphenhydrAMINE **OR** diphenhydrAMINE, fentaNYL (SUBLIMAZE) injection, HYDROcodone-acetaminophen, loperamide, morphine injection, neomycin-bacitracin-polymyxin, ondansetron, opium-belladonna, oxybutynin, sodium chloride flush   Vital Signs    Vitals:   06/14/17 1345 06/14/17 2149 06/14/17 2308 06/15/17 0557  BP: 121/80 (!) 159/68  (!) 143/57  Pulse: 63 72  65  Resp: 20 16  16   Temp: (!) 97.3 F (36.3 C) 99.1 F (37.3 C)  99.8 F (37.7 C)  TempSrc: Oral Oral  Oral  SpO2: 95% 93%  91%  Weight:   167 lb 15.9 oz (76.2 kg)   Height:        Intake/Output Summary (Last 24 hours) at 06/15/2017 0832 Last data filed at 06/15/2017 0600 Gross per 24 hour  Intake 1299.5 ml  Output -  Net 1299.5 ml   Filed Weights   06/09/17 1212 06/14/17 2308  Weight: 166 lb 9.6 oz (75.6 kg) 167 lb 15.9 oz (76.2 kg)    Telemetry    NSR - Personally Reviewed  Physical  Exam   GEN: No acute distress.  HEENT: Normocephalic, atraumatic, sclera non-icteric. Neck: No JVD or bruits. Cardiac: RRR no murmurs, rubs, or gallops.  Radials/DP/PT 1+ and equal bilaterally.  Respiratory: Clear to auscultation bilaterally. Breathing is unlabored. GI: Soft, nontender, non-distended, BS +x 4. MS: no deformity. Extremities: No clubbing or cyanosis. No edema. Distal pedal pulses are 2+ and equal bilaterally. Neuro:  AAOx3. Follows commands. Psych:  Responds to questions appropriately with a normal affect.  Labs    Chemistry Recent Labs  Lab 06/11/17 0454 06/12/17 0223 06/15/17 0502  NA 141 139 142  K 4.4 3.9 3.4*  CL 111 111 109  CO2 23 20* 25  GLUCOSE 95 90 116*  BUN 41* 23* 9  CREATININE 1.22* 0.91 0.78  CALCIUM 8.2* 8.4* 8.6*  GFRNONAA 41* 58* >60  GFRAA 48* >60 >60  ANIONGAP 7 8 8      Hematology Recent Labs  Lab 06/13/17 0426 06/14/17 0508 06/15/17 0502  WBC 11.7* 10.1 10.6*  RBC 3.30* 3.47* 3.38*  HGB 9.9* 10.6* 9.9*  HCT 30.2* 32.0* 31.1*  MCV 91.5 92.2 92.0  MCH 30.0 30.5 29.3  MCHC 32.8 33.1 31.8  RDW 14.6 14.8 14.6  PLT 213 222 272    Cardiac Enzymes Recent Labs  Lab 06/09/17 1806 06/10/17 1006 06/10/17 1526 06/10/17 2149  TROPONINI 19.37* 9.77* 7.57* 6.35*   No results for input(s): TROPIPOC in the last 168 hours.   BNP Recent Labs  Lab 06/14/17 0508  BNP 326.1*     DDimer No results for input(s): DDIMER in the last 168 hours.   Radiology    No results found.  Cardiac Studies   2D Echo 06/10/17 - Left ventricle: The estimated ejection fraction was 55%. Doppler   parameters are consistent with both elevated ventricular   end-diastolic filling pressure and elevated left atrial filling   pressure. - Mitral valve: Calcified annulus. Mildly thickened leaflets . - Left atrium: The atrium was mildly dilated. - Atrial septum: There was increased thickness of the septum,   consistent with lipomatous hypertrophy. No  defect or patent   foramen ovale was identified. - Pulmonary arteries: PA peak pressure: 34 mm Hg (S).  Patient Profile     82 y.o. female with past medical history of hypertension, asymptomatic PVCs, noncardiac chest pain was admitted with left ureteral stone and UTI. Underwent cystoscopy with left ureteral stent placement. Following extubation in the PACU she developed dyspnea/acute respiratory failure and tachycardia requiring sedation and re-intubation. Post-op course notable for E Coli bacteremia requiring antibiotics, acute kidney injury, and mild diarrhea. Cardiology following for NSTEMI with peak troponin of 19. 2D echo was above - normal LVEF, elevated LVEDP.   Assessment & Plan    1. Obstructing left ureteral stone s/p surgery 08/03/72, complicated by the above respiratory failure, UTI and E coli bacteremia, mild diarrhea - per primary teams. Urology note indicates eventual plan to address ureteral stone once she has completed her course of antibiotics. Diarrhea improved. Recheck temp 98.8 this AM.  2. NSTEMI - currently on board for cath today. EKGs earlier in admission certainly were concerning for evolving MI, but needed to address infectious issues first. Now much improved. Continue heparin, aspirin, beta blocker, statin. Suspect significant underlying CAD given the degree of troponin elevation. Will check baseline liver function with CMET in AM along with lipid panel. Risks and benefits of cardiac catheterization have been discussed with the patient.  These include bleeding, infection, kidney damage, stroke, heart attack, death.  The patient understands these risks and is willing to proceed. She watched video and feels well informed. Of note, echo showed elevated LVEDP and left atrial filling pressure, done shortly after IV fluid resuscitation for sepsis. She appears euvolemic today and does not have any orthopnea or dyspnea. Per cath lab nurse there were 2 sets of pre cath orders in; one  set was discontinued by cath lab. Agree with pre-cath fluids of 75/hr for now.  3. HTN - blood pressure mildly elevated today, diastolic on the lower side. Follow and consider titration of amlodipine.  4. Anemia - ? Related to critical illness during admission - hemoglobin relatively stable, follow. Further per IM. Place order for hemoccult as a precaution.  5. Acute kidney injury - resolved. Repleted K today for hypokalemia (3.4 -> 43meq po x 1).  For questions or updates, please contact [place] Please consult [email or web address] for contact info under Cardiology/STEMI.  Signed, Charlie Pitter, PA-C 06/15/2017, 8:32 AM    Patient examined chart reviewed. For cath today SEMI troponin 19 preserved EF. Post uretal stent and Rx For ecoli bacteremia and urosepsis Echo with preserved EF. Exam with clear lungs and no murmur no edema And good radial pulse Further recommendations based on diagnostic cath today  Collier Salina  [employer]

## 2017-06-15 NOTE — Interval H&P Note (Signed)
Cath Lab Visit (complete for each Cath Lab visit)  Clinical Evaluation Leading to the Procedure:   ACS: Yes.    Non-ACS:    Anginal Classification: CCS III  Anti-ischemic medical therapy: No Therapy  Non-Invasive Test Results: No non-invasive testing performed  Prior CABG: No previous CABG      History and Physical Interval Note:  06/15/2017 2:55 PM  Jacqueline Burke  has presented today for surgery, with the diagnosis of nstemi  The various methods of treatment have been discussed with the patient and family. After consideration of risks, benefits and other options for treatment, the patient has consented to  Procedure(s): LEFT HEART CATH AND CORONARY ANGIOGRAPHY (N/A) as a surgical intervention .  The patient's history has been reviewed, patient examined, no change in status, stable for surgery.  I have reviewed the patient's chart and labs.  Questions were answered to the patient's satisfaction.     Quay Burow

## 2017-06-15 NOTE — Progress Notes (Signed)
Urine output noted pinkish to reddish  In color,denies discomfort in urination Dr Teena Dunk notified with new IV order given. Kept monitored, endorsed to incoming RN

## 2017-06-15 NOTE — Progress Notes (Signed)
ANTICOAGULATION CONSULT NOTE - Follow Up Consult  Pharmacy Consult for heparin Indication: chest pain/ACS  Allergies  Allergen Reactions  . Ciprofloxacin Hives  . Codeine Hives  . Demerol [Meperidine] Hives  . Darvon [Propoxyphene]     Other reaction(s): OTHER  . Hydrogen Peroxide     White blisters  . Tramadol     "ITCHY ON THE INSIDE"    Patient Measurements: Height: 5\' 1"  (154.9 cm) Weight: 167 lb 15.9 oz (76.2 kg) IBW/kg (Calculated) : 47.8   Vital Signs: Temp: 99.8 F (37.7 C) (01/14 0557) Temp Source: Oral (01/14 0557) BP: 143/57 (01/14 0557) Pulse Rate: 65 (01/14 0557)  Labs: Recent Labs    06/13/17 0426 06/14/17 0508 06/14/17 1353 06/15/17 0502  HGB 9.9* 10.6*  --  9.9*  HCT 30.2* 32.0*  --  31.1*  PLT 213 222  --  272  LABPROT  --   --   --  13.5  INR  --   --   --  1.04  HEPARINUNFRC 0.42 0.15* 0.42 0.53  CREATININE  --   --   --  0.78    Estimated Creatinine Clearance: 53.3 mL/min (by C-G formula based on SCr of 0.78 mg/dL).   Assessment: 27 YOF presenting for L ureteral stent for L ureteral calculus on 1/8. She developed SOB in PACU requiring intubation.  Troponin was elevated on 1/9 with heparin started for NSTEMI.  Cardiology team recommends cardiac cath procedure on 1/14.   Today, 06/15/2017: - Heparin level therapeutic at 0.53 on 1550 units/hr - H/H stable.  Pltc WNL - no bleeding reported - cath lab scheduled for 1500 today  Goal of Therapy:  Heparin level 0.3-0.7 units/ml Monitor platelets by anticoagulation protocol: Yes   Plan:  -Continue heparin at present rate, 1550 units/hr -Daily heparin level and CBC - f/u after cath to see if heparin is resumed  Eudelia Bunch, Pharm.D. [phone] 06/15/2017 8:15 AM

## 2017-06-15 NOTE — Progress Notes (Signed)
Patient ID: Jacqueline Burke, female   DOB: Jul 14, 1935, 82 y.o.   MRN: [id number]  PROGRESS NOTE    Jacqueline Burke  YHC:[id number] DOB: [birth year] DOA: 06/09/2017 PCP: Darcus Austin, MD   Brief Narrative:  82 year old female with history of allergic rhinitis, diverticulosis, hyperlipidemia, hypertension, osteoarthritis presented with left ureteral stone and UTI. She underwent cystoscopy and left ureteral stent placement with stone removal. Hospital course was complicated with intubation due to respiratory failure. Patient was in ICU. She was found to have non-STEMI. Cardiology was consulted, heparin drip was started. She was successfully extubated on 06/10/2017 and was transferred out of ICU on the following day. Care was subsequently taken over by hospitalist team. She was found to have Escherichia coli bacteremia for which she was on intravenous antibiotics which has been switched to oral antibiotics. She is planned for cardiac catheterization today.   Assessment & Plan:   Active Problems:   Ureteral calculus   Chronic chest pain   Acute respiratory failure with hypoxemia (HCC)   E coli bacteremia   Pyelonephritis   NSTEMI (non-ST elevated myocardial infarction) ([place])   1. Sepsis ? Resolved. Hemodynamically stable 2. Acute respiratory failure with hypoxemia ? Resolved. Currently in room air 3. E Coli Bacteremia with pyelonephritis  Continue cefpodoxime until 06/26/2017 to complete 14 days of antibiotic treatment 4. Ureteral Calculus  Status post Ureteral stent placement.  Urology following. Outpatient follow-up with urology 6. NSTEMI ? Continue heparin drip ? Cardiology following. Plan for today ? Continue Beta-blocker, baby aspirin, Norvasc, Lipitor high-dose     DVT prophylaxis: Heparin drip Code Status:  Full Family Communication: None at bedside Disposition Plan: Depends on clinical outcome  Consultants: Urology and cardiology  Procedures: Cystoscopy and ureteral stent  placement on 06/09/2017   Antimicrobials:  Anti-infectives (From admission, onward)   Start     Dose/Rate Route Frequency Ordered Stop   06/12/17 1400  cefpodoxime (VANTIN) tablet 200 mg     200 mg Oral Every 12 hours 06/12/17 1354 06/23/17 0959   06/12/17 1315  sulfamethoxazole-trimethoprim (BACTRIM,SEPTRA) 400-80 MG per tablet 1 tablet  Status:  Discontinued     1 tablet Oral Every 12 hours 06/12/17 1314 06/12/17 1352   06/11/17 0800  vancomycin (VANCOCIN) 500 mg in sodium chloride 0.9 % 100 mL IVPB  Status:  Discontinued     500 mg 100 mL/hr over 60 Minutes Intravenous Every 36 hours 06/09/17 1900 06/10/17 1204   06/10/17 1400  cefTRIAXone (ROCEPHIN) 2 g in dextrose 5 % 50 mL IVPB  Status:  Discontinued     2 g 100 mL/hr over 30 Minutes Intravenous Every 24 hours 06/10/17 1204 06/12/17 1314   06/10/17 1200  piperacillin-tazobactam (ZOSYN) IVPB 3.375 g  Status:  Discontinued     3.375 g 12.5 mL/hr over 240 Minutes Intravenous Every 8 hours 06/10/17 1042 06/10/17 1204   06/09/17 2000  piperacillin-tazobactam (ZOSYN) IVPB 2.25 g  Status:  Discontinued     2.25 g 100 mL/hr over 30 Minutes Intravenous Every 6 hours 06/09/17 1900 06/10/17 1042   06/09/17 1915  vancomycin (VANCOCIN) 1,500 mg in sodium chloride 0.9 % 500 mL IVPB     1,500 mg 250 mL/hr over 120 Minutes Intravenous  Once 06/09/17 1840 06/09/17 2325   06/09/17 1830  piperacillin-tazobactam (ZOSYN) IVPB 3.375 g  Status:  Discontinued     3.375 g 12.5 mL/hr over 240 Minutes Intravenous Every 8 hours 06/09/17 1806 06/09/17 1900   06/09/17 1730  cefTRIAXone (ROCEPHIN) 2 g  in dextrose 5 % 50 mL IVPB  Status:  Discontinued     2 g 100 mL/hr over 30 Minutes Intravenous  Once 06/09/17 1703 06/09/17 1815   06/09/17 1345  ceFAZolin (ANCEF) IVPB 2g/100 mL premix     2 g 200 mL/hr over 30 Minutes Intravenous  Once 06/09/17 1331 06/09/17 1457         Subjective: Patient seen and examined at bedside. She denies any current chest  pain. No overnight fever or vomiting.  Objective: Vitals:   06/14/17 2149 06/14/17 2308 06/15/17 0557 06/15/17 0848  BP: (!) 159/68  (!) 143/57   Pulse: 72  65   Resp: 16  16   Temp: 99.1 F (37.3 C)  99.8 F (37.7 C) 98.8 F (37.1 C)  TempSrc: Oral  Oral   SpO2: 93%  91%   Weight:  76.2 kg (167 lb 15.9 oz)    Height:        Intake/Output Summary (Last 24 hours) at 06/15/2017 1057 Last data filed at 06/15/2017 0600 Gross per 24 hour  Intake 997.5 ml  Output -  Net 997.5 ml   Filed Weights   06/09/17 1212 06/14/17 2308  Weight: 75.6 kg (166 lb 9.6 oz) 76.2 kg (167 lb 15.9 oz)    Examination:  General exam: Appears calm and comfortable  Respiratory system: Bilateral decreased breath sound at bases Cardiovascular system: S1 & S2 heard, rate controlled.  Gastrointestinal system: Abdomen is nondistended, soft and nontender. Normal bowel sounds heard. Extremities: No cyanosis, clubbing, edema     Data Reviewed: I have personally reviewed following labs and imaging studies  CBC: Recent Labs  Lab 06/09/17 1517  06/11/17 0454 06/12/17 0223 06/13/17 0426 06/14/17 0508 06/15/17 0502  WBC 3.9*   < > 20.6* 13.9* 11.7* 10.1 10.6*  NEUTROABS 2.7  --   --   --   --   --   --   HGB 11.4*   < > 10.6* 10.6* 9.9* 10.6* 9.9*  HCT 36.5   < > 32.7* 32.0* 30.2* 32.0* 31.1*  MCV 94.1   < > 93.7 92.2 91.5 92.2 92.0  PLT 178   < > 176 178 213 222 272   < > = values in this interval not displayed.   Basic Metabolic Panel: Recent Labs  Lab 06/09/17 1517 06/09/17 1656 06/10/17 0515 06/11/17 0454 06/12/17 0223 06/15/17 0502  NA 137 135 138 141 139 142  K 4.2 3.9 4.1 4.4 3.9 3.4*  CL 103  --  108 111 111 109  CO2 22  --  22 23 20* 25  GLUCOSE 106*  --  134* 95 90 116*  BUN 49*  --  41* 41* 23* 9  CREATININE 2.28*  --  1.69* 1.22* 0.91 0.78  CALCIUM 8.7*  --  8.1* 8.2* 8.4* 8.6*   GFR: Estimated Creatinine Clearance: 53.3 mL/min (by C-G formula based on SCr of 0.78  mg/dL). Liver Function Tests: No results for input(s): AST, ALT, ALKPHOS, BILITOT, PROT, ALBUMIN in the last 168 hours. No results for input(s): LIPASE, AMYLASE in the last 168 hours. No results for input(s): AMMONIA in the last 168 hours. Coagulation Profile: Recent Labs  Lab 06/09/17 1806 06/15/17 0502  INR 1.15 1.04   Cardiac Enzymes: Recent Labs  Lab 06/09/17 1806 06/10/17 1006 06/10/17 1526 06/10/17 2149  TROPONINI 19.37* 9.77* 7.57* 6.35*   BNP (last 3 results) No results for input(s): PROBNP in the last 8760 hours. HbA1C: No results for  input(s): HGBA1C in the last 72 hours. CBG: No results for input(s): GLUCAP in the last 168 hours. Lipid Profile: No results for input(s): CHOL, HDL, LDLCALC, TRIG, CHOLHDL, LDLDIRECT in the last 72 hours. Thyroid Function Tests: No results for input(s): TSH, T4TOTAL, FREET4, T3FREE, THYROIDAB in the last 72 hours. Anemia Panel: No results for input(s): VITAMINB12, FOLATE, FERRITIN, TIBC, IRON, RETICCTPCT in the last 72 hours. Sepsis Labs: Recent Labs  Lab 06/09/17 1806 06/09/17 2146  PROCALCITON 41.45  --   LATICACIDVEN 1.4 1.1    Recent Results (from the past 240 hour(s))  Urine culture     Status: Abnormal   Collection Time: 06/07/17  7:51 PM  Result Value Ref Range Status   Specimen Description URINE, RANDOM  Final   Special Requests NONE  Final   Culture 20,000 COLONIES/mL ESCHERICHIA COLI (A)  Final   Report Status 06/10/2017 FINAL  Final   Organism ID, Bacteria ESCHERICHIA COLI (A)  Final      Susceptibility   Escherichia coli - MIC*    AMPICILLIN >=32 RESISTANT Resistant     CEFAZOLIN >=64 RESISTANT Resistant     CEFTRIAXONE <=1 SENSITIVE Sensitive     CIPROFLOXACIN <=0.25 SENSITIVE Sensitive     GENTAMICIN <=1 SENSITIVE Sensitive     IMIPENEM <=0.25 SENSITIVE Sensitive     NITROFURANTOIN <=16 SENSITIVE Sensitive     TRIMETH/SULFA <=20 SENSITIVE Sensitive     AMPICILLIN/SULBACTAM >=32 RESISTANT Resistant      PIP/TAZO <=4 SENSITIVE Sensitive     Extended ESBL NEGATIVE Sensitive     * 20,000 COLONIES/mL ESCHERICHIA COLI  Culture, Urine     Status: Abnormal   Collection Time: 06/09/17  3:26 PM  Result Value Ref Range Status   Specimen Description URINE, CATHETERIZED  Final   Special Requests NONE  Final   Culture >=100,000 COLONIES/mL ESCHERICHIA COLI (A)  Final   Report Status 06/11/2017 FINAL  Final   Organism ID, Bacteria ESCHERICHIA COLI (A)  Final      Susceptibility   Escherichia coli - MIC*    AMPICILLIN >=32 RESISTANT Resistant     CEFAZOLIN >=64 RESISTANT Resistant     CEFTRIAXONE <=1 SENSITIVE Sensitive     CIPROFLOXACIN <=0.25 SENSITIVE Sensitive     GENTAMICIN <=1 SENSITIVE Sensitive     IMIPENEM <=0.25 SENSITIVE Sensitive     NITROFURANTOIN <=16 SENSITIVE Sensitive     TRIMETH/SULFA <=20 SENSITIVE Sensitive     AMPICILLIN/SULBACTAM >=32 RESISTANT Resistant     PIP/TAZO <=4 SENSITIVE Sensitive     Extended ESBL NEGATIVE Sensitive     * >=100,000 COLONIES/mL ESCHERICHIA COLI  Culture, blood (x 2)     Status: None   Collection Time: 06/09/17  6:06 PM  Result Value Ref Range Status   Specimen Description BLOOD CENTRAL LINE  Final   Special Requests   Final    BOTTLES DRAWN AEROBIC ONLY Blood Culture adequate volume   Culture   Final    NO GROWTH 5 DAYS Performed at [facility], [address] [place], [place] [address]    Report Status 06/14/2017 FINAL  Final  Culture, blood (x 2)     Status: Abnormal   Collection Time: 06/09/17  6:29 PM  Result Value Ref Range Status   Specimen Description BLOOD RIGHT HAND  Final   Special Requests IN PEDIATRIC BOTTLE Blood Culture adequate volume  Final   Culture  Setup Time   Final    GRAM NEGATIVE RODS IN PEDIATRIC BOTTLE  CRITICAL RESULT CALLED TO, READ BACK BY AND VERIFIED WITH: J. Scherrie November Pharm.D. 11:40 06/10/17 ([email or web address]) Performed at [facility], [place] [address] [place], [place] [address]    Culture ESCHERICHIA COLI  (A)  Final   Report Status 06/12/2017 FINAL  Final   Organism ID, Bacteria ESCHERICHIA COLI  Final      Susceptibility   Escherichia coli - MIC*    AMPICILLIN >=32 RESISTANT Resistant     CEFAZOLIN >=64 RESISTANT Resistant     CEFEPIME <=1 SENSITIVE Sensitive     CEFTAZIDIME <=1 SENSITIVE Sensitive     CEFTRIAXONE <=1 SENSITIVE Sensitive     CIPROFLOXACIN <=0.25 SENSITIVE Sensitive     GENTAMICIN <=1 SENSITIVE Sensitive     IMIPENEM <=0.25 SENSITIVE Sensitive     TRIMETH/SULFA <=20 SENSITIVE Sensitive     AMPICILLIN/SULBACTAM >=32 RESISTANT Resistant     PIP/TAZO <=4 SENSITIVE Sensitive     Extended ESBL NEGATIVE Sensitive     * ESCHERICHIA COLI  Blood Culture ID Panel (Reflexed)     Status: Abnormal   Collection Time: 06/09/17  6:29 PM  Result Value Ref Range Status   Enterococcus species NOT DETECTED NOT DETECTED Final   Listeria monocytogenes NOT DETECTED NOT DETECTED Final   Staphylococcus species NOT DETECTED NOT DETECTED Final   Staphylococcus aureus NOT DETECTED NOT DETECTED Final   Streptococcus species NOT DETECTED NOT DETECTED Final   Streptococcus agalactiae NOT DETECTED NOT DETECTED Final   Streptococcus pneumoniae NOT DETECTED NOT DETECTED Final   Streptococcus pyogenes NOT DETECTED NOT DETECTED Final   Acinetobacter baumannii NOT DETECTED NOT DETECTED Final   Enterobacteriaceae species DETECTED (A) NOT DETECTED Final    Comment: Enterobacteriaceae represent a large family of gram-negative bacteria, not a single organism. CRITICAL RESULT CALLED TO, READ BACK BY AND VERIFIED WITH: J. Scherrie November Pharm.D. 11:40 06/10/17 ([email or web address])    Enterobacter cloacae complex NOT DETECTED NOT DETECTED Final   Escherichia coli DETECTED (A) NOT DETECTED Final    Comment: CRITICAL RESULT CALLED TO, READ BACK BY AND VERIFIED WITH: J. Scherrie November Pharm.D. 11:40 06/10/17 ([email or web address])    Klebsiella oxytoca NOT DETECTED NOT DETECTED Final   Klebsiella pneumoniae NOT DETECTED NOT DETECTED Final   Proteus  species NOT DETECTED NOT DETECTED Final   Serratia marcescens NOT DETECTED NOT DETECTED Final   Carbapenem resistance NOT DETECTED NOT DETECTED Final   Haemophilus influenzae NOT DETECTED NOT DETECTED Final   Neisseria meningitidis NOT DETECTED NOT DETECTED Final   Pseudomonas aeruginosa NOT DETECTED NOT DETECTED Final   Candida albicans NOT DETECTED NOT DETECTED Final   Candida glabrata NOT DETECTED NOT DETECTED Final   Candida krusei NOT DETECTED NOT DETECTED Final   Candida parapsilosis NOT DETECTED NOT DETECTED Final   Candida tropicalis NOT DETECTED NOT DETECTED Final  MRSA PCR Screening     Status: None   Collection Time: 06/09/17  9:24 PM  Result Value Ref Range Status   MRSA by PCR NEGATIVE NEGATIVE Final    Comment:        The GeneXpert MRSA Assay (FDA approved for NASAL specimens only), is one component of a comprehensive MRSA colonization surveillance program. It is not intended to diagnose MRSA infection nor to guide or monitor treatment for MRSA infections.   C difficile quick scan w PCR reflex     Status: None   Collection Time: 06/14/17  5:24 PM  Result Value Ref Range Status   C Diff antigen NEGATIVE NEGATIVE Final   C Diff toxin NEGATIVE  NEGATIVE Final   C Diff interpretation No C. difficile detected.  Final         Radiology Studies: No results found.      Scheduled Meds: . amitriptyline  20 mg Oral QHS  . amLODipine  5 mg Oral Daily  . aspirin EC  81 mg Oral Daily  . atorvastatin  80 mg Oral q1800  . cefpodoxime  200 mg Oral Q12H  . docusate sodium  100 mg Oral BID  . gabapentin  100 mg Oral QHS  . mouth rinse  15 mL Mouth Rinse BID  . metoprolol tartrate  25 mg Oral BID  . mirabegron ER  50 mg Oral Daily  . sodium chloride flush  3 mL Intravenous Q12H   Continuous Infusions: . sodium chloride Stopped (06/15/17 0557)  . sodium chloride    . sodium chloride 75 mL/hr at 06/15/17 0556  . heparin 1,550 Units/hr (06/14/17 1929)     LOS: 5  days        Aline August, MD [facility] Pager [phone]  If 7PM-7AM, please contact night-coverage [email or web address] Password [facility] 06/15/2017, 10:57 AM

## 2017-06-16 ENCOUNTER — Encounter (HOSPITAL_COMMUNITY): Payer: Self-pay | Admitting: Cardiovascular Disease

## 2017-06-16 DIAGNOSIS — Z955 Presence of coronary angioplasty implant and graft: Secondary | ICD-10-CM

## 2017-06-16 LAB — COMPREHENSIVE METABOLIC PANEL
ALK PHOS: 34 U/L — AB (ref 38–126)
ALT: 47 U/L (ref 14–54)
AST: 34 U/L (ref 15–41)
Albumin: 2.4 g/dL — ABNORMAL LOW (ref 3.5–5.0)
Anion gap: 9 (ref 5–15)
BUN: 7 mg/dL (ref 6–20)
CALCIUM: 8.5 mg/dL — AB (ref 8.9–10.3)
CO2: 24 mmol/L (ref 22–32)
CREATININE: 0.86 mg/dL (ref 0.44–1.00)
Chloride: 108 mmol/L (ref 101–111)
GFR calc non Af Amer: >60 mL/min (ref >60)
Glucose, Bld: 94 mg/dL (ref 65–99)
Potassium: 3.7 mmol/L (ref 3.5–5.1)
SODIUM: 141 mmol/L (ref 135–145)
Total Bilirubin: 0.6 mg/dL (ref 0.3–1.2)
Total Protein: 5.5 g/dL — ABNORMAL LOW (ref 6.5–8.1)

## 2017-06-16 LAB — CBC
HCT: 32 % — ABNORMAL LOW (ref 36.0–46.0)
Hemoglobin: 10.1 g/dL — ABNORMAL LOW (ref 12.0–15.0)
MCH: 29.3 pg (ref 26.0–34.0)
MCHC: 31.6 g/dL (ref 30.0–36.0)
MCV: 92.8 fL (ref 78.0–100.0)
PLATELETS: 285 10*3/uL (ref 150–400)
RBC: 3.45 MIL/uL — ABNORMAL LOW (ref 3.87–5.11)
RDW: 14.7 % (ref 11.5–15.5)
WBC: 9.9 10*3/uL (ref 4.0–10.5)

## 2017-06-16 LAB — LIPID PANEL
Cholesterol: 79 mg/dL (ref 0–200)
HDL: 23 mg/dL — AB (ref >40)
LDL Cholesterol: 33 mg/dL (ref 0–99)
Total CHOL/HDL Ratio: 3.4 RATIO
Triglycerides: 116 mg/dL (ref <150)
VLDL: 23 mg/dL (ref 0–40)

## 2017-06-16 LAB — MAGNESIUM: Magnesium: 1.6 mg/dL — ABNORMAL LOW (ref 1.7–2.4)

## 2017-06-16 MED ORDER — ANGIOPLASTY BOOK
Freq: Once | Status: AC
  Administered 2017-06-16: 1
  Filled 2017-06-16: qty 1

## 2017-06-16 MED ORDER — HEART ATTACK BOUNCING BOOK
Freq: Once | Status: AC
  Administered 2017-06-16: 02:00:00 1
  Filled 2017-06-16: qty 1

## 2017-06-16 MED ORDER — MAGNESIUM SULFATE 2 GM/50ML IV SOLN
2.0000 g | Freq: Once | INTRAVENOUS | Status: AC
  Administered 2017-06-16: 10:00:00 2 g via INTRAVENOUS
  Filled 2017-06-16: qty 50

## 2017-06-16 MED ORDER — ATORVASTATIN CALCIUM 80 MG PO TABS: 80.0000 mg | ORAL_TABLET | Freq: Every day | ORAL | 0 refills | Status: DC

## 2017-06-16 MED ORDER — CEFPODOXIME PROXETIL 200 MG PO TABS: 200.0000 mg | ORAL_TABLET | Freq: Two times a day (BID) | ORAL | 0 refills | Status: AC

## 2017-06-16 MED ORDER — METOPROLOL TARTRATE 25 MG PO TABS: 25.0000 mg | ORAL_TABLET | Freq: Two times a day (BID) | ORAL | 0 refills | Status: DC

## 2017-06-16 MED ORDER — ASPIRIN EC 81 MG PO TBEC: 81.0000 mg | DELAYED_RELEASE_TABLET | Freq: Every day | ORAL | 0 refills | Status: AC

## 2017-06-16 MED ORDER — TICAGRELOR 90 MG PO TABS: 90.0000 mg | ORAL_TABLET | Freq: Two times a day (BID) | ORAL | 0 refills | Status: DC

## 2017-06-16 MED FILL — Nitroglycerin IV Soln 100 MCG/ML in D5W: INTRA_ARTERIAL | Qty: 10 | Status: AC

## 2017-06-16 NOTE — Progress Notes (Signed)
TR BAND REMOVAL  LOCATION:    right radial  DEFLATED PER PROTOCOL:    Yes.    TIME BAND OFF / DRESSING APPLIED:    00:00   SITE UPON ARRIVAL:    Level 0  SITE AFTER BAND REMOVAL:    Level 0  CIRCULATION SENSATION AND MOVEMENT:    Within Normal Limits   Yes.    COMMENTS:   Post TR band instructions given. Pt tolerated well.

## 2017-06-16 NOTE — Progress Notes (Signed)
Progress Note  Patient Name: Jacqueline Burke Date of Encounter: 06/16/2017  Primary Cardiologist: Dr. Radford Pax  Subjective   No chest pain wants to go home   Inpatient Medications    Scheduled Meds: . amitriptyline  20 mg Oral QHS  . amLODipine  5 mg Oral Daily  . aspirin  81 mg Oral Daily  . atorvastatin  80 mg Oral q1800  . cefpodoxime  200 mg Oral Q12H  . docusate sodium  100 mg Oral BID  . gabapentin  100 mg Oral QHS  . mouth rinse  15 mL Mouth Rinse BID  . metoprolol tartrate  25 mg Oral BID  . mirabegron ER  50 mg Oral Daily  . sodium chloride flush  3 mL Intravenous Q12H  . ticagrelor  90 mg Oral BID   Continuous Infusions: . sodium chloride Stopped (06/15/17 0557)  . sodium chloride    . sodium chloride 75 mL/hr at 06/16/17 0700  . magnesium sulfate 1 - 4 g bolus IVPB     PRN Meds: sodium chloride, acetaminophen, diphenhydrAMINE **OR** diphenhydrAMINE, fentaNYL (SUBLIMAZE) injection, HYDROcodone-acetaminophen, loperamide, morphine injection, neomycin-bacitracin-polymyxin, ondansetron (ZOFRAN) IV, opium-belladonna, oxybutynin, sodium chloride flush   Vital Signs    Vitals:   06/15/17 1630 06/15/17 1936 06/15/17 2300 06/16/17 0632  BP:  (!) 143/75 (!) 155/54 (!) 148/50  Pulse:  64  67  Resp:  20 (!) 22 (!) 22  Temp: 98.4 F (36.9 C) 98.4 F (36.9 C)  98.5 F (36.9 C)  TempSrc: Oral Oral  Oral  SpO2:  98% 97% 96%  Weight:    164 lb 10.9 oz (74.7 kg)  Height:        Intake/Output Summary (Last 24 hours) at 06/16/2017 0839 Last data filed at 06/16/2017 0700 Gross per 24 hour  Intake 1282.75 ml  Output 2400 ml  Net -1117.25 ml   Filed Weights   06/09/17 1212 06/14/17 2308 06/16/17 6546  Weight: 166 lb 9.6 oz (75.6 kg) 167 lb 15.9 oz (76.2 kg) 164 lb 10.9 oz (74.7 kg)    Telemetry    NSR 06/16/2017  - Personally Reviewed  Physical Exam   Affect appropriate Pale elderly white female  HEENT: normal Neck supple with no adenopathy JVP normal no  bruits no thyromegaly Lungs clear with no wheezing and good diaphragmatic motion Heart:  S1/S2 AS  murmur, no rub, gallop or click PMI normal Abdomen: benighn, BS positve, no tenderness, no AAA no bruit.  No HSM or HJR Distal pulses intact with no bruits No edema Neuro non-focal Skin warm and dry No muscular weakness Right radial sight ok mild swelling of forearm above cath sight    Labs    Chemistry Recent Labs  Lab 06/12/17 0223 06/15/17 0502 06/16/17 0535  NA 139 142 141  K 3.9 3.4* 3.7  CL 111 109 108  CO2 20* 25 24  GLUCOSE 90 116* 94  BUN 23* 9 7  CREATININE 0.91 0.78 0.86  CALCIUM 8.4* 8.6* 8.5*  PROT  --   --  5.5*  ALBUMIN  --   --  2.4*  AST  --   --  34  ALT  --   --  47  ALKPHOS  --   --  34*  BILITOT  --   --  0.6  GFRNONAA 58* >60 >60  GFRAA >60 >60 >60  ANIONGAP 8 8 9      Hematology Recent Labs  Lab 06/14/17 0508 06/15/17 0502 06/16/17 0535  WBC 10.1 10.6* 9.9  RBC 3.47* 3.38* 3.45*  HGB 10.6* 9.9* 10.1*  HCT 32.0* 31.1* 32.0*  MCV 92.2 92.0 92.8  MCH 30.5 29.3 29.3  MCHC 33.1 31.8 31.6  RDW 14.8 14.6 14.7  PLT 222 272 285    Cardiac Enzymes Recent Labs  Lab 06/09/17 1806 06/10/17 1006 06/10/17 1526 06/10/17 2149  TROPONINI 19.37* 9.77* 7.57* 6.35*   No results for input(s): TROPIPOC in the last 168 hours.   BNP Recent Labs  Lab 06/14/17 0508  BNP 326.1*     DDimer No results for input(s): DDIMER in the last 168 hours.   Radiology    No results found.  Cardiac Studies   2D Echo 06/10/17 - Left ventricle: The estimated ejection fraction was 55%. Doppler   parameters are consistent with both elevated ventricular   end-diastolic filling pressure and elevated left atrial filling   pressure. - Mitral valve: Calcified annulus. Mildly thickened leaflets . - Left atrium: The atrium was mildly dilated. - Atrial septum: There was increased thickness of the septum,   consistent with lipomatous hypertrophy. No defect or  patent   foramen ovale was identified. - Pulmonary arteries: PA peak pressure: 34 mm Hg (S).  Patient Profile     82 y.o. female with past medical history of hypertension, asymptomatic PVCs, noncardiac chest pain was admitted with left ureteral stone and UTI. Underwent cystoscopy with left ureteral stent placement. Following extubation in the PACU she developed dyspnea/acute respiratory failure and tachycardia requiring sedation and re-intubation. Post-op course notable for E Coli bacteremia requiring antibiotics, acute kidney injury, and mild diarrhea. Cardiology following for NSTEMI with peak troponin of 19. 2D echo was above - normal LVEF, elevated LVEDP.   Assessment & Plan    1. Obstructing left ureteral stone s/p surgery 08/06/62, complicated by the above respiratory failure, UTI and E coli bacteremia, mild diarrhea - per primary teams. Urology note indicates eventual plan to address ureteral stone once she has completed her course of antibiotics. Diarrhea improved.    2. NSTEMI - post stenting of 95% lesion in RCA DAT outpatient f/u with Dr Radford Pax   3. HTN - Well controlled.  Continue current medications and low sodium Dash type diet.    4. Anemia - stable 32 post cath   5. Acute kidney injury - resolved. Cr .8 post cath   [facility] to d/c home will arrange outpatient f/u with Dr Onnie Boer

## 2017-06-16 NOTE — Progress Notes (Signed)
CARDIAC REHAB PHASE I   PRE:  Rate/Rhythm: 74 SR    BP: sitting 157/74    SaO2: 89-92 RA  MODE:  Ambulation: 250 ft   POST:  Rate/Rhythm: 87 SR    BP: sitting 150/65     SaO2: 90-91 RA  Pt weak and borderline SaO2 on RA. Used RW without c/o. SaO2 spotchecked while walking, never below 89 RA. Instructed pt to use RW for 1-2 days at home and practice IS (gave her another). Pt to recliner, very receptive to education. Understands importance of Brilinta. Will send referral to [facility].  [place], ACSM 06/16/2017 9:19 AM

## 2017-06-16 NOTE — Progress Notes (Signed)
#    12  S/W MONICA  @ [employer] # [id number] OPT- 2    BRILINTA  90 MG BID   COVER- YES  CO-PAY- $ 45.00  TIER- 3 DRUG  PRIOR APPROVAL- NO  NO DEDUCTIBLE   PREFERRED PHARMACY : [facility] , [facility] AND [facility]

## 2017-06-16 NOTE — Discharge Summary (Signed)
Discharge Summary  Jacqueline Burke VOH:[id number] DOB: 1935-06-19  PCP: Darcus Austin, MD  Admit date: 06/09/2017 Discharge date: 06/16/2017  Time spent: >30 mins   Recommendations for Outpatient Follow-up:  1. PCP 2. Cardiology 3. Urology  Discharge Diagnoses:  Active Hospital Problems   Diagnosis Date Noted  . NSTEMI (non-ST elevated myocardial infarction) ([place]) 06/12/2017  . Acute respiratory failure with hypoxemia ([facility] [facility])   . E coli bacteremia   . Pyelonephritis   . Chronic chest pain   . Ureteral calculus 06/09/2017    Resolved Hospital Problems  No resolved problems to display.    Discharge Condition: Stable  Diet recommendation: Heart healthy  Vitals:   06/16/17 0632 06/16/17 0813  BP: (!) 148/50 (!) 157/74  Pulse: 67 70  Resp: (!) 22 (!) 21  Temp: 98.5 F (36.9 C) (!) 97.3 F (36.3 C)  SpO2: 96% 93%    History of present illness:  82 year old female with history of allergic rhinitis, diverticulosis, hyperlipidemia, hypertension, osteoarthritis presented with left ureteral stone and UTI. She underwent cystoscopy and left ureteral stent placement with stone removal. Hospital course was complicated with intubation due to respiratory failure. Patient was in ICU. She was found to have non-STEMI. Cardiology was consulted, heparin drip was started. She was successfully extubated on 06/10/2017 and was transferred out of ICU on the following day. Care was subsequently taken over by hospitalist team. She was found to have Escherichia coli bacteremia for which she was on intravenous antibiotics which has been switched to oral antibiotics. Pt had cardiac catheterization and is stable for discharge.  Today patient denied any new complaints, denied any chest pain, shortness of breath, fever/chills, nausea/vomiting, dizziness.  Patient is stable for discharge.  Hospital Course:  Active Problems:   Ureteral calculus   Chronic chest pain   Acute respiratory failure with hypoxemia  (HCC)   E coli bacteremia   Pyelonephritis   NSTEMI (non-ST elevated myocardial infarction) ([place])  1. Sepsis ? Resolved. Hemodynamically stable 2. Acute respiratory failure with hypoxemia ? Resolved. Currently in room air 3. E Coli Bacteremia with pyelonephritis  Continue cefpodoxime until 06/26/2017 to complete 14 days of antibiotic treatment 4. Ureteral Calculus  Status post Ureteral stent placement.  Urology following. Outpatient follow-up with urology 5.   NSTEMI ? Cardiac cath done on 06/15/17 showed: Mid RCA lesion with 95% stenosed, a DES was placed. LVEF was 55-65% ? Continue DAPT for 1 yr uninterrupted: Beta-blocker, aspirin, brillanta, Lipitor high-dose     Procedures:  Cardiac cath on 06/15/17  Consultations:  Cardiology  Urology   Discharge Exam: BP (!) 157/74 (BP Location: Left Arm)   Pulse 70   Temp (!) 97.3 F (36.3 C) (Oral)   Resp (!) 21   Ht 5\' 1"  (1.549 m)   Wt 74.7 kg (164 lb 10.9 oz)   SpO2 93%   BMI 31.12 kg/m   General: Alert, awake, oriented, not in acute distress Cardiovascular: S1, S2 present Respiratory: Chest clear bilaterally  Discharge Instructions You were cared for by a hospitalist during your hospital stay. If you have any questions about your discharge medications or the care you received while you were in the hospital after you are discharged, you can call the unit and asked to speak with the hospitalist on call if the hospitalist that took care of you is not available. Once you are discharged, your primary care physician will handle any further medical issues. Please note that NO REFILLS for any discharge medications will be authorized  once you are discharged, as it is imperative that you return to your primary care physician (or establish a relationship with a primary care physician if you do not have one) for your aftercare needs so that they can reassess your need for medications and monitor your lab values.  Discharge  Instructions    Amb Referral to Cardiac Rehabilitation   Complete by:  As directed    Diagnosis:   Coronary Stents NSTEMI PTCA     Diet - low sodium heart healthy   Complete by:  As directed    Increase activity slowly   Complete by:  As directed      Allergies as of 06/16/2017      Reactions   Ciprofloxacin Hives   Codeine Hives   Demerol [meperidine] Hives   Darvon [propoxyphene]    Other reaction(s): OTHER   Hydrogen Peroxide    White blisters   Tramadol    "ITCHY ON THE INSIDE"      Medication List    STOP taking these medications   cephALEXin 500 MG capsule Commonly known as:  KEFLEX     TAKE these medications   ACETAMIN PO Take 200 mg by mouth as directed. 2-6 Tablets daily----Arthritis   amitriptyline 10 MG tablet Commonly known as:  ELAVIL Take 20 mg by mouth at bedtime. Uses for Chronic Pain   amLODipine 5 MG tablet Commonly known as:  NORVASC Take 1 tablet (5 mg total) by mouth daily.   aspirin EC 81 MG tablet Take 1 tablet (81 mg total) by mouth daily.   atorvastatin 80 MG tablet Commonly known as:  LIPITOR Take 1 tablet (80 mg total) by mouth daily at 6 PM. What changed:    medication strength  how much to take  when to take this   cefpodoxime 200 MG tablet Commonly known as:  VANTIN Take 1 tablet (200 mg total) by mouth every 12 (twelve) hours for 21 doses.   cholecalciferol 1000 units tablet Commonly known as:  VITAMIN D Take 1,000 Units by mouth daily.   docusate sodium 100 MG capsule Commonly known as:  COLACE Take 300 mg by mouth daily.   FIBER PO Take 2 tablets by mouth daily.   fluticasone 50 MCG/ACT nasal spray Commonly known as:  FLONASE Place 2 sprays into both nostrils daily.   gabapentin 100 MG capsule Commonly known as:  NEURONTIN Take 100 mg by mouth at bedtime.   metoprolol tartrate 25 MG tablet Commonly known as:  LOPRESSOR Take 1 tablet (25 mg total) by mouth 2 (two) times daily.   MYRBETRIQ 50 MG Tb24  tablet Generic drug:  mirabegron ER Take 50 mg by mouth daily.   ondansetron 4 MG disintegrating tablet Commonly known as:  ZOFRAN ODT Take 1 tablet (4 mg total) by mouth every 8 (eight) hours as needed for nausea or vomiting.   oxyCODONE-acetaminophen 5-325 MG tablet Commonly known as:  PERCOCET/ROXICET Take 1-2 tablets by mouth every 4 (four) hours as needed for severe pain.   ticagrelor 90 MG Tabs tablet Commonly known as:  BRILINTA Take 1 tablet (90 mg total) by mouth 2 (two) times daily. Start taking on:  06/17/2017   VITAMIN C PO Take 1 tablet by mouth daily.      Allergies  Allergen Reactions  . Ciprofloxacin Hives  . Codeine Hives  . Demerol [Meperidine] Hives  . Darvon [Propoxyphene]     Other reaction(s): OTHER  . Hydrogen Peroxide     White blisters  .  Tramadol     "ITCHY ON THE INSIDE"   Follow-up Information    Darcus Austin, MD. Schedule an appointment as soon as possible for a visit in 1 week(s).   Specialty:  Family Medicine Contact information: [place] [address] [phone]        Sueanne Margarita, MD .   Specialty:  Cardiology Contact information: [phone] [address] [place] [address] [phone]        Lucas Mallow, MD In 1 week.   Specialty:  Urology Why:  Call to schedule kidney stone surgery  Contact information: [place] [address] [phone]            The results of significant diagnostics from this hospitalization (including imaging, microbiology, ancillary and laboratory) are listed below for reference.    Significant Diagnostic Studies: X-ray Chest Pa Or Ap  Result Date: 06/09/2017 CLINICAL DATA:  Respiratory distress. Tachycardia. Sepsis. Intubated. EXAM: CHEST 1 VIEW COMPARISON:  08/14/2014 FINDINGS: Endotracheal tube tip 2.5 cm above the carina. Pacemaker overlies the chest. Chronic lung disease with widespread interstitial markings. No  sign of acute consolidation or collapse. No effusions. Bony structures unremarkable. IMPRESSION: Endotracheal tube well positioned. Diffuse chronic fibrotic lung markings. Electronically Signed   By: Nelson Chimes M.D.   On: 06/09/2017 15:50   Dg Abd 1 View  Result Date: 06/09/2017 CLINICAL DATA:  Encounter for orogastric tube EXAM: ABDOMEN - 1 VIEW COMPARISON:  Abdominal CT 06/07/2017 FINDINGS: An orogastric tube is coiled in the fundus with tip at the antrum. Left ureteral stent with visualized portion in expected location. Left nephrolithiasis measuring 1 cm. Symmetric lung aeration with nonspecific bilateral reticulation. IMPRESSION: Orogastric tube with tip at the distal stomach. Electronically Signed   By: Monte Fantasia M.D.   On: 06/09/2017 20:10   Portable Chest Xray  Result Date: 06/10/2017 CLINICAL DATA:  Respiratory failure. EXAM: PORTABLE CHEST 1 VIEW COMPARISON:  06/19/2017. FINDINGS: Low lung volumes. Unchanged ETT 2.6 cm above carina. Cardiac enlargement. Mild vascular congestion. No pneumothorax. IMPRESSION: Similar appearance to priors. Mild vascular congestion. Stable ETT. Electronically Signed   By: Staci Righter M.D.   On: 06/10/2017 07:12   Dg C-arm 1-60 Min-no Report  Result Date: 06/09/2017 Fluoroscopy was utilized by the requesting physician.  No radiographic interpretation.   Ct Renal Stone Study  Result Date: 06/07/2017 CLINICAL DATA:  Right-sided abdominal pain radiates to the back. EXAM: CT ABDOMEN AND PELVIS WITHOUT CONTRAST TECHNIQUE: Multidetector CT imaging of the abdomen and pelvis was performed following the standard protocol without IV contrast. COMPARISON:  04/03/2010 FINDINGS: Lower chest:  No acute findings. Hepatobiliary: No focal abnormality in the liver on this study without intravenous contrast. There is no evidence for gallstones, gallbladder wall thickening, or pericholecystic fluid. No intrahepatic or extrahepatic biliary dilation. Pancreas: No focal mass  lesion. No dilatation of the main duct. No intraparenchymal cyst. No peripancreatic edema. Spleen: No splenomegaly. No focal mass lesion. Adrenals/Urinary Tract: No adrenal nodule or mass. 2 mm nonobstructing stone identified interpolar right kidney. No right ureteral stone. 7 x 6 x 10 mm stone is identified in the proximal left ureter causing mild left hydronephrosis with perinephric edema. No other left ureteral stone. No bladder stone. Stomach/Bowel: Small hiatal hernia. Stomach is nondistended. No gastric wall thickening. No evidence of outlet obstruction. Duodenum is normally positioned as is the ligament of Treitz. No small bowel wall thickening. No small bowel dilatation. The  appendix is normal. No gross colonic mass. No colonic wall thickening. Diffuse colonic diverticulosis is most advanced in the left colon. No evidence for diverticulitis. Vascular/Lymphatic: There is abdominal aortic atherosclerosis without aneurysm. There is no gastrohepatic or hepatoduodenal ligament lymphadenopathy. No intraperitoneal or retroperitoneal lymphadenopathy. No pelvic sidewall lymphadenopathy. Reproductive: Uterus unremarkable. There is no adnexal mass. 18 mm cystic focus in the left ovary was 3.4 cm on the prior study. Other: No intraperitoneal free fluid. Musculoskeletal: Degenerative changes noted lumbar spine with left-sided pars interarticularis defect at L5. IMPRESSION: 1. 7 x 6 x 10 mm stone in the proximal left ureter causes mild left hydronephrosis and prominent left perinephric edema. 2. Tiny nonobstructing stone right kidney. 3. Diffuse colonic diverticulosis without diverticulitis. 4.  Aortic Atherosclerois (ICD10-170.0) Electronically Signed   By: Misty Stanley M.D.   On: 06/07/2017 18:52    Microbiology: Recent Results (from the past 240 hour(s))  Urine culture     Status: Abnormal   Collection Time: 06/07/17  7:51 PM  Result Value Ref Range Status   Specimen Description URINE, RANDOM  Final   Special  Requests NONE  Final   Culture 20,000 COLONIES/mL ESCHERICHIA COLI (A)  Final   Report Status 06/10/2017 FINAL  Final   Organism ID, Bacteria ESCHERICHIA COLI (A)  Final      Susceptibility   Escherichia coli - MIC*    AMPICILLIN >=32 RESISTANT Resistant     CEFAZOLIN >=64 RESISTANT Resistant     CEFTRIAXONE <=1 SENSITIVE Sensitive     CIPROFLOXACIN <=0.25 SENSITIVE Sensitive     GENTAMICIN <=1 SENSITIVE Sensitive     IMIPENEM <=0.25 SENSITIVE Sensitive     NITROFURANTOIN <=16 SENSITIVE Sensitive     TRIMETH/SULFA <=20 SENSITIVE Sensitive     AMPICILLIN/SULBACTAM >=32 RESISTANT Resistant     PIP/TAZO <=4 SENSITIVE Sensitive     Extended ESBL NEGATIVE Sensitive     * 20,000 COLONIES/mL ESCHERICHIA COLI  Culture, Urine     Status: Abnormal   Collection Time: 06/09/17  3:26 PM  Result Value Ref Range Status   Specimen Description URINE, CATHETERIZED  Final   Special Requests NONE  Final   Culture >=100,000 COLONIES/mL ESCHERICHIA COLI (A)  Final   Report Status 06/11/2017 FINAL  Final   Organism ID, Bacteria ESCHERICHIA COLI (A)  Final      Susceptibility   Escherichia coli - MIC*    AMPICILLIN >=32 RESISTANT Resistant     CEFAZOLIN >=64 RESISTANT Resistant     CEFTRIAXONE <=1 SENSITIVE Sensitive     CIPROFLOXACIN <=0.25 SENSITIVE Sensitive     GENTAMICIN <=1 SENSITIVE Sensitive     IMIPENEM <=0.25 SENSITIVE Sensitive     NITROFURANTOIN <=16 SENSITIVE Sensitive     TRIMETH/SULFA <=20 SENSITIVE Sensitive     AMPICILLIN/SULBACTAM >=32 RESISTANT Resistant     PIP/TAZO <=4 SENSITIVE Sensitive     Extended ESBL NEGATIVE Sensitive     * >=100,000 COLONIES/mL ESCHERICHIA COLI  Culture, blood (x 2)     Status: None   Collection Time: 06/09/17  6:06 PM  Result Value Ref Range Status   Specimen Description BLOOD CENTRAL LINE  Final   Special Requests   Final    BOTTLES DRAWN AEROBIC ONLY Blood Culture adequate volume   Culture   Final    NO GROWTH 5 DAYS Performed at [facility], [address] [place], [place] [address]    Report Status 06/14/2017 FINAL  Final  Culture, blood (x 2)  Status: Abnormal   Collection Time: 06/09/17  6:29 PM  Result Value Ref Range Status   Specimen Description BLOOD RIGHT HAND  Final   Special Requests IN PEDIATRIC BOTTLE Blood Culture adequate volume  Final   Culture  Setup Time   Final    GRAM NEGATIVE RODS IN PEDIATRIC BOTTLE CRITICAL RESULT CALLED TO, READ BACK BY AND VERIFIED WITH: J. Scherrie November Pharm.D. 11:40 06/10/17 ([email or web address]) Performed at [facility], [place] [address] [place],  [address]    Culture ESCHERICHIA COLI (A)  Final   Report Status 06/12/2017 FINAL  Final   Organism ID, Bacteria ESCHERICHIA COLI  Final      Susceptibility   Escherichia coli - MIC*    AMPICILLIN >=32 RESISTANT Resistant     CEFAZOLIN >=64 RESISTANT Resistant     CEFEPIME <=1 SENSITIVE Sensitive     CEFTAZIDIME <=1 SENSITIVE Sensitive     CEFTRIAXONE <=1 SENSITIVE Sensitive     CIPROFLOXACIN <=0.25 SENSITIVE Sensitive     GENTAMICIN <=1 SENSITIVE Sensitive     IMIPENEM <=0.25 SENSITIVE Sensitive     TRIMETH/SULFA <=20 SENSITIVE Sensitive     AMPICILLIN/SULBACTAM >=32 RESISTANT Resistant     PIP/TAZO <=4 SENSITIVE Sensitive     Extended ESBL NEGATIVE Sensitive     * ESCHERICHIA COLI  Blood Culture ID Panel (Reflexed)     Status: Abnormal   Collection Time: 06/09/17  6:29 PM  Result Value Ref Range Status   Enterococcus species NOT DETECTED NOT DETECTED Final   Listeria monocytogenes NOT DETECTED NOT DETECTED Final   Staphylococcus species NOT DETECTED NOT DETECTED Final   Staphylococcus aureus NOT DETECTED NOT DETECTED Final   Streptococcus species NOT DETECTED NOT DETECTED Final   Streptococcus agalactiae NOT DETECTED NOT DETECTED Final   Streptococcus pneumoniae NOT DETECTED NOT DETECTED Final   Streptococcus pyogenes NOT DETECTED NOT DETECTED Final   Acinetobacter baumannii NOT DETECTED NOT DETECTED Final    Enterobacteriaceae species DETECTED (A) NOT DETECTED Final    Comment: Enterobacteriaceae represent a large family of gram-negative bacteria, not a single organism. CRITICAL RESULT CALLED TO, READ BACK BY AND VERIFIED WITH: J. Scherrie November Pharm.D. 11:40 06/10/17 ([email or web address])    Enterobacter cloacae complex NOT DETECTED NOT DETECTED Final   Escherichia coli DETECTED (A) NOT DETECTED Final    Comment: CRITICAL RESULT CALLED TO, READ BACK BY AND VERIFIED WITH: J. Scherrie November Pharm.D. 11:40 06/10/17 ([email or web address])    Klebsiella oxytoca NOT DETECTED NOT DETECTED Final   Klebsiella pneumoniae NOT DETECTED NOT DETECTED Final   Proteus species NOT DETECTED NOT DETECTED Final   Serratia marcescens NOT DETECTED NOT DETECTED Final   Carbapenem resistance NOT DETECTED NOT DETECTED Final   Haemophilus influenzae NOT DETECTED NOT DETECTED Final   Neisseria meningitidis NOT DETECTED NOT DETECTED Final   Pseudomonas aeruginosa NOT DETECTED NOT DETECTED Final   Candida albicans NOT DETECTED NOT DETECTED Final   Candida glabrata NOT DETECTED NOT DETECTED Final   Candida krusei NOT DETECTED NOT DETECTED Final   Candida parapsilosis NOT DETECTED NOT DETECTED Final   Candida tropicalis NOT DETECTED NOT DETECTED Final  MRSA PCR Screening     Status: None   Collection Time: 06/09/17  9:24 PM  Result Value Ref Range Status   MRSA by PCR NEGATIVE NEGATIVE Final    Comment:        The GeneXpert MRSA Assay (FDA approved for NASAL specimens only), is one component of a comprehensive MRSA colonization surveillance program. It is not intended to  diagnose MRSA infection nor to guide or monitor treatment for MRSA infections.   C difficile quick scan w PCR reflex     Status: None   Collection Time: 06/14/17  5:24 PM  Result Value Ref Range Status   C Diff antigen NEGATIVE NEGATIVE Final   C Diff toxin NEGATIVE NEGATIVE Final   C Diff interpretation No C. difficile detected.  Final     Labs: Basic Metabolic Panel: Recent  Labs  Lab 06/10/17 0515 06/11/17 0454 06/12/17 0223 06/15/17 0502 06/16/17 0535  NA 138 141 139 142 141  K 4.1 4.4 3.9 3.4* 3.7  CL 108 111 111 109 108  CO2 22 23 20* 25 24  GLUCOSE 134* 95 90 116* 94  BUN 41* 41* 23* 9 7  CREATININE 1.69* 1.22* 0.91 0.78 0.86  CALCIUM 8.1* 8.2* 8.4* 8.6* 8.5*  MG  --   --   --   --  1.6*   Liver Function Tests: Recent Labs  Lab 06/16/17 0535  AST 34  ALT 47  ALKPHOS 34*  BILITOT 0.6  PROT 5.5*  ALBUMIN 2.4*   No results for input(s): LIPASE, AMYLASE in the last 168 hours. No results for input(s): AMMONIA in the last 168 hours. CBC: Recent Labs  Lab 06/12/17 0223 06/13/17 0426 06/14/17 0508 06/15/17 0502 06/16/17 0535  WBC 13.9* 11.7* 10.1 10.6* 9.9  HGB 10.6* 9.9* 10.6* 9.9* 10.1*  HCT 32.0* 30.2* 32.0* 31.1* 32.0*  MCV 92.2 91.5 92.2 92.0 92.8  PLT 178 213 222 272 285   Cardiac Enzymes: Recent Labs  Lab 06/10/17 1006 06/10/17 1526 06/10/17 2149  TROPONINI 9.77* 7.57* 6.35*   BNP: BNP (last 3 results) Recent Labs    06/14/17 0508  BNP 326.1*    ProBNP (last 3 results) No results for input(s): PROBNP in the last 8760 hours.  CBG: No results for input(s): GLUCAP in the last 168 hours.     Signed:  Alma Friendly, MD [facility] 06/16/2017, 8:27 PM

## 2017-06-17 ENCOUNTER — Telehealth: Payer: Self-pay | Admitting: Cardiology

## 2017-06-17 NOTE — Consult Note (Signed)
           [facility] CM Primary Care Navigator  06/17/2017  Jacqueline Burke 27-Jun-1935 [id number]   Went to see patient to identify possible discharge needs but she was already discharged per staff report. Patient was discharged home yesterday.  Primary care provider's office is listed as providing transition of care (TOC).  Patient has discharge instruction to follow-up with primary care provider as soon as possible for a visit in 1 week.   For questions, please contact:  Dannielle Huh, BSN, RN- [facility] Primary Care Navigator  Telephone: [phone] [name]

## 2017-06-17 NOTE — Telephone Encounter (Signed)
Made an appointment for the patient to see Cecilie Kicks NP for f/u after heart cath. Patient verbalized understanding.

## 2017-06-17 NOTE — Telephone Encounter (Signed)
New message  Patient requesting follow up from hospital  for 1 week with Dr Radford Pax. Declined to schedule with APP staff. Please advise

## 2017-06-19 ENCOUNTER — Telehealth (HOSPITAL_COMMUNITY): Payer: Self-pay

## 2017-06-19 NOTE — Telephone Encounter (Signed)
Patients insurance is active and benefits verified through [place] - $15.00 co-pay, no deductible, out of pocket amount of $3,400/$0.00 has been met, no co-insurance, and no pre-authorization is required. Spoke with [employer] - Reference [id number]  Patient will be contacted and scheduled after their follow up appt with the Cardiologist office upon review by the RN Navigator.

## 2017-06-22 DIAGNOSIS — J309 Allergic rhinitis, unspecified: Secondary | ICD-10-CM | POA: Diagnosis not present

## 2017-06-22 DIAGNOSIS — I10 Essential (primary) hypertension: Secondary | ICD-10-CM | POA: Diagnosis not present

## 2017-06-22 DIAGNOSIS — Z955 Presence of coronary angioplasty implant and graft: Secondary | ICD-10-CM | POA: Diagnosis not present

## 2017-06-22 DIAGNOSIS — N200 Calculus of kidney: Secondary | ICD-10-CM | POA: Diagnosis not present

## 2017-06-22 DIAGNOSIS — I700 Atherosclerosis of aorta: Secondary | ICD-10-CM | POA: Diagnosis not present

## 2017-06-22 DIAGNOSIS — E7800 Pure hypercholesterolemia, unspecified: Secondary | ICD-10-CM | POA: Diagnosis not present

## 2017-06-22 DIAGNOSIS — I2510 Atherosclerotic heart disease of native coronary artery without angina pectoris: Secondary | ICD-10-CM | POA: Diagnosis not present

## 2017-07-01 NOTE — Progress Notes (Signed)
Cardiology Office Note   Date:  07/02/2017   ID:  Jacqueline Burke, DOB February 07, 1936, MRN [id number]  PCP:  Darcus Austin, MD  Cardiologist:  Dr. Radford Pax    Chief Complaint  Patient presents with  . Hospitalization Follow-up    NSTEMI      History of Present Illness: Jacqueline Burke is a 82 y.o. female who presents for post cath.  She has past medical history of hypertension, asymptomatic PVCs, noncardiac chest pain was admitted with left ureteral stone and UTI. Underwent cystoscopy with left ureteral stent placement.Following extubation in the PACU she developed dyspnea/acute respiratory failureand tachycardia requiring sedation and re-intubation. Post-op course notable for E Coli bacteremia requiring antibiotics, acute kidney injury, and mild diarrhea. Cardiology following for NSTEMI with peak troponin of 19. 2D echo was above - normal LVEF, elevated LVEDP. She presented 06/15/17 for diagnostic coronary angiography to define her anatomy.  She was found to have 95% RCA stenosis normal EF, EF 55-65%.  DES stent successfully placed.   PA pk pressure 34 mmHg by Echo.  Discharged on the 15th  Today she is most concerned about memory issues.  At times she is in a fog and cannot remember what she wants to.  After her morning meds it is the worst.  She has not check BP then  But will.   Today she has not taken her meds.yet.  She has had no chest pain or SOB.  She would like to go to cardiac rehab but would like to wait until the ureteral stent is removed.  She is having some discomfort from the stent. She is to see Dr. Gloriann Loan in a couple of weeks.  During hospitalization her magnesium was low, AKI and anemia will check today.  BP is up today but as stated did not take meds yet.   Past Medical History:  Diagnosis Date  . Allergic rhinitis   . Chronic chest pain    and DOE w normal Stress cardiolyte  . Diverticulosis   . Heart murmur    Systolic heart murmur with Aortic valve sclerosis by ECHO  .  Hyperlipidemia   . Hypertension   . Osteoarthritis    Erosive OA-MRI of R hand negative for synovitis, only OA-Dr Trudie Reed  . Osteopenia   . Pelvic prolapse   . PVC's (premature ventricular contractions)     Past Surgical History:  Procedure Laterality Date  . BUNIONECTOMY  08/2006   R foot and hammer roe right second toe  . CARPAL TUNNEL RELEASE  2002  . CORONARY STENT INTERVENTION N/A 06/15/2017   Procedure: CORONARY STENT INTERVENTION;  Surgeon: Lorretta Harp, MD;  Location: [facility];  Service: Cardiovascular;  Laterality: N/A;  . CYSTOSCOPY W/ URETERAL STENT PLACEMENT Left 06/09/2017   Procedure: CYSTOSCOPY WITH RETROGRADE PYELOGRAM/URETERAL STENT PLACEMENT;  Surgeon: Lucas Mallow, MD;  Location: [facility];  Service: Urology;  Laterality: Left;  . LEFT HEART CATH AND CORONARY ANGIOGRAPHY N/A 06/15/2017   Procedure: LEFT HEART CATH AND CORONARY ANGIOGRAPHY;  Surgeon: Lorretta Harp, MD;  Location: [facility];  Service: Cardiovascular;  Laterality: N/A;  . TOTAL KNEE ARTHROPLASTY Left 08/2007  . TOTAL KNEE ARTHROPLASTY Right 03/2010     Current Outpatient Medications  Medication Sig Dispense Refill  . Acetaminophen (ACETAMIN PO) Take 200 mg by mouth as directed. 2-6 Tablets daily----Arthritis    . amitriptyline (ELAVIL) 10 MG tablet Take 20 mg by mouth at bedtime. Uses for Chronic Pain    .  amLODipine (NORVASC) 5 MG tablet Take 1 tablet (5 mg total) by mouth daily. 90 tablet 3  . Ascorbic Acid (VITAMIN C PO) Take 1 tablet by mouth daily.    Marland Kitchen aspirin EC 81 MG tablet Take 1 tablet (81 mg total) by mouth daily. 30 tablet 0  . atorvastatin (LIPITOR) 80 MG tablet Take 1 tablet (80 mg total) by mouth daily at 6 PM. 30 tablet 0  . cholecalciferol (VITAMIN D) 1000 UNITS tablet Take 1,000 Units by mouth daily.    Marland Kitchen docusate sodium (COLACE) 100 MG capsule Take 300 mg by mouth daily.    Marland Kitchen FIBER PO Take 2 tablets by mouth daily.    . fluticasone (FLONASE) 50 MCG/ACT  nasal spray Place 2 sprays into both nostrils daily. 16 g 2  . gabapentin (NEURONTIN) 100 MG capsule Take 100 mg by mouth at bedtime.     . metoprolol tartrate (LOPRESSOR) 25 MG tablet Take 1 tablet (25 mg total) by mouth 2 (two) times daily. 60 tablet 0  . ondansetron (ZOFRAN ODT) 4 MG disintegrating tablet Take 1 tablet (4 mg total) by mouth every 8 (eight) hours as needed for nausea or vomiting. 10 tablet 0  . oxybutynin (DITROPAN) 5 MG tablet Take 5 mg by mouth 2 (two) times daily.    . ticagrelor (BRILINTA) 90 MG TABS tablet Take 1 tablet (90 mg total) by mouth 2 (two) times daily. 60 tablet 0   No current facility-administered medications for this visit.     Allergies:   Ciprofloxacin; Codeine; Demerol [meperidine]; Darvon [propoxyphene]; Hydrogen peroxide; and Tramadol    Social History:  The patient  reports that she quit smoking about 24 years ago. Her smoking use included cigarettes. She has a 25.00 pack-year smoking history. she has never used smokeless tobacco. She reports that she does not drink alcohol or use drugs.   Family History:  The patient's family history includes Breast cancer in her cousin; Diabetes in her mother; Stroke in her mother.    ROS:  General:no colds or fevers, no weight changes Skin:no rashes or ulcers HEENT:no blurred vision, no congestion CV:see HPI PUL:see HPI GI:no diarrhea constipation or melena, no indigestion GU:no hematuria, no dysuria MS:no joint pain, no claudication Neuro:no syncope, no lightheadedness Endo:no diabetes, no thyroid disease  Wt Readings from Last 3 Encounters:  07/02/17 160 lb 12.8 oz (72.9 kg)  06/16/17 164 lb 10.9 oz (74.7 kg)  06/07/17 170 lb (77.1 kg)     PHYSICAL EXAM: VS:  BP (!) 144/78   Pulse 65   Ht 5\' 1"  (1.549 m)   Wt 160 lb 12.8 oz (72.9 kg)   BMI 30.38 kg/m  , BMI Body mass index is 30.38 kg/m. General:Pleasant affect, NAD Skin:Warm and dry, brisk capillary refill HEENT:normocephalic, sclera clear,  mucus membranes moist Neck:supple, no JVD, no bruits  Heart:S1S2 RRR without murmur, gallup, rub or click Lungs:clear without rales, rhonchi, or wheezes WIO:MBTD, non tender, + BS, do not palpate liver spleen or masses Ext:no lower ext edema, 2+ pedal pulses, 2+ radial pulses Neuro:alert and oriented X 3, MAE, follows commands, + facial symmetry    EKG:  EKG is ordered today. The ekg ordered today demonstrates SR LVH with T wave inversions II.III, AVF, and V3-6.   Recent Labs: 06/14/2017: B Natriuretic Peptide 326.1 06/16/2017: ALT 47; BUN 7; Creatinine, Ser 0.86; Hemoglobin 10.1; Magnesium 1.6; Platelets 285; Potassium 3.7; Sodium 141    Lipid Panel    Component Value Date/Time  CHOL 79 06/16/2017 0535   TRIG 116 06/16/2017 0535   HDL 23 (L) 06/16/2017 0535   CHOLHDL 3.4 06/16/2017 0535   VLDL 23 06/16/2017 0535   LDLCALC 33 06/16/2017 0535       Other studies Reviewed: Additional studies/ records that were reviewed today include: . Echo 06/10/17 Study Conclusions  - Left ventricle: The estimated ejection fraction was 55%. Doppler   parameters are consistent with both elevated ventricular   end-diastolic filling pressure and elevated left atrial filling   pressure. - Mitral valve: Calcified annulus. Mildly thickened leaflets . - Left atrium: The atrium was mildly dilated. - Atrial septum: There was increased thickness of the septum,   consistent with lipomatous hypertrophy. No defect or patent   foramen ovale was identified. - Pulmonary arteries: PA peak pressure: 34 mm Hg (S).  CATH 06/15/17 Procedures   CORONARY STENT INTERVENTION  LEFT HEART CATH AND CORONARY ANGIOGRAPHY  Conclusion     Mid RCA lesion is 95% stenosed.  A stent was successfully placed.  Post intervention, there is a 0% residual stenosis.  The left ventricular systolic function is normal.  LV end diastolic pressure is normal.  The left ventricular ejection fraction is 55-65% by  visual estimate.       ASSESSMENT AND PLAN:  1.  NSTEMI in combination of Sepsis with renal stone and stent placed. AKI that improved in the hospital. Follow up with Dr. Radford Pax  2.  CAD with s/p stent to RCA normal EF. On Brilinta and ASA needs to continue for at least 1 year.  3.  HLD on statin, needs lipids recheck in 6-8 weeks.  4.  Recent ureteral stent. To see Dr. Gloriann Loan - she will be unable to stop Brilinta.  5.  HTN controlled.  May dropping she will check BP at home when she feels bad after meds to see.   6.  Anemia will check CBC  7.  hypomag will recheck   8.  AKI with sepsis improved to normal prior to cath.  Will recheck today post cath.    Current medicines are reviewed with the patient today.  The patient Has no concerns regarding medicines.  The following changes have been made:  See above Labs/ tests ordered today include:see above  Disposition:   FU:  see above  Signed, Cecilie Kicks, NP  07/02/2017 8:38 AM    [facility] [address], [place] [place], [place] Phone: [phone]; Fax: [phone]

## 2017-07-02 ENCOUNTER — Ambulatory Visit: Payer: PPO | Admitting: Cardiology

## 2017-07-02 ENCOUNTER — Encounter: Payer: Self-pay | Admitting: Cardiology

## 2017-07-02 ENCOUNTER — Encounter (INDEPENDENT_AMBULATORY_CARE_PROVIDER_SITE_OTHER): Payer: Self-pay

## 2017-07-02 VITALS — BP 144/78 | HR 65 | Ht 61.0 in | Wt 160.8 lb

## 2017-07-02 DIAGNOSIS — I214 Non-ST elevation (NSTEMI) myocardial infarction: Secondary | ICD-10-CM | POA: Diagnosis not present

## 2017-07-02 DIAGNOSIS — I119 Hypertensive heart disease without heart failure: Secondary | ICD-10-CM | POA: Diagnosis not present

## 2017-07-02 DIAGNOSIS — I493 Ventricular premature depolarization: Secondary | ICD-10-CM | POA: Diagnosis not present

## 2017-07-02 DIAGNOSIS — N289 Disorder of kidney and ureter, unspecified: Secondary | ICD-10-CM | POA: Diagnosis not present

## 2017-07-02 DIAGNOSIS — D649 Anemia, unspecified: Secondary | ICD-10-CM

## 2017-07-02 LAB — CBC
HEMATOCRIT: 34 % (ref 34.0–46.6)
HEMOGLOBIN: 11.3 g/dL (ref 11.1–15.9)
MCH: 30.1 pg (ref 26.6–33.0)
MCHC: 33.2 g/dL (ref 31.5–35.7)
MCV: 90 fL (ref 79–97)
Platelets: 256 10*3/uL (ref 150–379)
RBC: 3.76 x10E6/uL — AB (ref 3.77–5.28)
RDW: 14.3 % (ref 12.3–15.4)
WBC: 8.8 10*3/uL (ref 3.4–10.8)

## 2017-07-02 LAB — BASIC METABOLIC PANEL
BUN/Creatinine Ratio: 23 (ref 12–28)
BUN: 24 mg/dL (ref 8–27)
CALCIUM: 10.1 mg/dL (ref 8.7–10.3)
CHLORIDE: 104 mmol/L (ref 96–106)
CO2: 24 mmol/L (ref 20–29)
Creatinine, Ser: 1.06 mg/dL — ABNORMAL HIGH (ref 0.57–1.00)
GFR calc Af Amer: 58 mL/min/{1.73_m2} — ABNORMAL LOW (ref >59)
GFR calc non Af Amer: 50 mL/min/{1.73_m2} — ABNORMAL LOW (ref >59)
GLUCOSE: 107 mg/dL — AB (ref 65–99)
POTASSIUM: 4.7 mmol/L (ref 3.5–5.2)
Sodium: 143 mmol/L (ref 134–144)

## 2017-07-02 LAB — MAGNESIUM: MAGNESIUM: 1.9 mg/dL (ref 1.6–2.3)

## 2017-07-02 NOTE — Patient Instructions (Addendum)
Medication Instructions:  Your physician recommends that you continue on your current medications as directed. Please refer to the Current Medication list given to you today.   Labwork: TODAY:  BMET, CBC, & MAGNESIUM  Testing/Procedures: None ordered  Follow-Up: Your physician recommends that you schedule a follow-up appointment in: 6-8 WEEKS WITH DR. Radford Pax  Any Other Special Instructions Will Be Listed Below (If Applicable).     If you need a refill on your cardiac medications before your next appointment, please call your pharmacy.

## 2017-07-03 ENCOUNTER — Other Ambulatory Visit: Payer: Self-pay | Admitting: *Deleted

## 2017-07-03 DIAGNOSIS — R748 Abnormal levels of other serum enzymes: Secondary | ICD-10-CM

## 2017-07-06 ENCOUNTER — Telehealth (HOSPITAL_COMMUNITY): Payer: Self-pay

## 2017-07-06 NOTE — Telephone Encounter (Signed)
Called patient to see if interested in the Cardiac Rehab Program - Patient is interested. Patient as Ureteral Stent in and goes to see Dr.Bell who placed stent in on Thursday. Patient stated that she doesn't know if she will be getting it out that day. Will follow up with patient Thursday afternoon or Friday morning.

## 2017-07-09 ENCOUNTER — Telehealth: Payer: Self-pay | Admitting: Cardiology

## 2017-07-09 DIAGNOSIS — R350 Frequency of micturition: Secondary | ICD-10-CM | POA: Diagnosis not present

## 2017-07-09 DIAGNOSIS — N3946 Mixed incontinence: Secondary | ICD-10-CM | POA: Diagnosis not present

## 2017-07-09 DIAGNOSIS — N201 Calculus of ureter: Secondary | ICD-10-CM | POA: Diagnosis not present

## 2017-07-09 NOTE — Telephone Encounter (Signed)
   Primary Cardiologist: Fransico Him, MD  Chart reviewed as part of pre-operative protocol coverage. Patient was contacted 07/09/2017 in reference to pre-operative risk assessment for pending surgery as outlined below.  Jacqueline Burke was last seen on 07/02/17 by Cecilie Kicks. During that visit, she was doing well and denied chest pain. She knows that she can't interrupt ASA and brilinta for 12 months.  Therefore, based on ACC/AHA guidelines, the patient would be at acceptable risk for the planned procedure without further cardiovascular testing.   I will route this recommendation to the requesting party via Epic fax function and remove from pre-op pool.  Please call with questions.  Tami Lin Duke, PA 07/09/2017, 3:12 PM

## 2017-07-09 NOTE — Telephone Encounter (Signed)
° °  [facility] Pre-operative Risk Assessment    Request for surgical clearance:  1. What type of surgery is being performed? Ureteroscopy   2. When is this surgery scheduled? Pending   3. What type of clearance is required (medical clearance vs. Pharmacy clearance to hold med vs. Both)?General Cardiac Clearance  4. Are there any medications that need to be held prior to surgery and how long?Pt can stay on all medicine   5. Practice name and name of physician performing surgery? Dr Link Snuffer   6. What is your office phone and fax number?[phone] and fax is [phone]   7. Anesthesia type (None, local, MAC, general) ? General   Jacqueline Burke 07/09/2017, 2:52 PM  _________________________________________________________________   (provider comments below)

## 2017-07-10 ENCOUNTER — Telehealth (HOSPITAL_COMMUNITY): Payer: Self-pay

## 2017-07-10 NOTE — Telephone Encounter (Signed)
Called to follow up with patient in regards to stent that needs to be taken out - Patient stated that her PCP said she cannot be put to sleep right now to get stent taken out. Dr.Bell stated he was going to get in touch with Dr.Turner to move forward. Will continue to follow. Patient paperwork in file cabinet.

## 2017-07-13 ENCOUNTER — Other Ambulatory Visit: Payer: Self-pay | Admitting: Urology

## 2017-07-13 ENCOUNTER — Other Ambulatory Visit: Payer: Self-pay | Admitting: *Deleted

## 2017-07-13 MED ORDER — TICAGRELOR 90 MG PO TABS: 90.0000 mg | ORAL_TABLET | Freq: Two times a day (BID) | ORAL | 10 refills | Status: DC

## 2017-07-13 NOTE — Telephone Encounter (Signed)
Patient called and requested a refill on brilinta which she was started on recently in the hospital. See note associated with the order, just wanted to clarify okay before I send in the rx as I have not encountered this previously. Please advise. Thanks, MI

## 2017-07-15 ENCOUNTER — Telehealth: Payer: Self-pay | Admitting: Cardiology

## 2017-07-15 NOTE — Telephone Encounter (Signed)
Patient calling in regards to lab work, Patient is scheduled to go to [facility] for preadmission and will have to have blood work. Patient would like to know if the blood work that she is having for preadmission could be used for this office as well. Please advise.

## 2017-07-15 NOTE — Telephone Encounter (Signed)
Patient is scheduled to have repeat BMET 2/15 at [facility] (see 1/31 BMET notes). She is scheduled for preadmission testing on Friday as well.  She requests to not come to [facility] and to see if labs drawn at testing are fine. Informed her Dr. Theodosia Blender nurse will watch for BMET results on Friday and will call her if further lab work needs to be drawn. Patient was grateful for call.

## 2017-07-17 ENCOUNTER — Other Ambulatory Visit: Payer: Self-pay | Admitting: Cardiology

## 2017-07-17 ENCOUNTER — Encounter (INDEPENDENT_AMBULATORY_CARE_PROVIDER_SITE_OTHER): Payer: Self-pay

## 2017-07-17 ENCOUNTER — Other Ambulatory Visit: Payer: Self-pay

## 2017-07-17 ENCOUNTER — Encounter (HOSPITAL_COMMUNITY): Payer: Self-pay

## 2017-07-17 ENCOUNTER — Encounter (HOSPITAL_COMMUNITY)
Admission: RE | Admit: 2017-07-17 | Discharge: 2017-07-17 | Disposition: A | Discharge status: Home / Self Care | Payer: PPO | Source: Ambulatory Visit | Attending: Urology | Admitting: Urology

## 2017-07-17 ENCOUNTER — Other Ambulatory Visit: Payer: PPO

## 2017-07-17 DIAGNOSIS — Z7982 Long term (current) use of aspirin: Secondary | ICD-10-CM | POA: Diagnosis not present

## 2017-07-17 DIAGNOSIS — R011 Cardiac murmur, unspecified: Secondary | ICD-10-CM | POA: Diagnosis not present

## 2017-07-17 DIAGNOSIS — Z888 Allergy status to other drugs, medicaments and biological substances status: Secondary | ICD-10-CM | POA: Diagnosis not present

## 2017-07-17 DIAGNOSIS — Z87891 Personal history of nicotine dependence: Secondary | ICD-10-CM | POA: Diagnosis not present

## 2017-07-17 DIAGNOSIS — Z885 Allergy status to narcotic agent status: Secondary | ICD-10-CM | POA: Diagnosis not present

## 2017-07-17 DIAGNOSIS — K219 Gastro-esophageal reflux disease without esophagitis: Secondary | ICD-10-CM | POA: Diagnosis not present

## 2017-07-17 DIAGNOSIS — Z881 Allergy status to other antibiotic agents status: Secondary | ICD-10-CM | POA: Diagnosis not present

## 2017-07-17 DIAGNOSIS — Z96653 Presence of artificial knee joint, bilateral: Secondary | ICD-10-CM | POA: Diagnosis not present

## 2017-07-17 DIAGNOSIS — I252 Old myocardial infarction: Secondary | ICD-10-CM | POA: Diagnosis not present

## 2017-07-17 DIAGNOSIS — Z79899 Other long term (current) drug therapy: Secondary | ICD-10-CM | POA: Diagnosis not present

## 2017-07-17 DIAGNOSIS — M8580 Other specified disorders of bone density and structure, unspecified site: Secondary | ICD-10-CM | POA: Diagnosis not present

## 2017-07-17 DIAGNOSIS — N201 Calculus of ureter: Secondary | ICD-10-CM | POA: Diagnosis not present

## 2017-07-17 DIAGNOSIS — Z8719 Personal history of other diseases of the digestive system: Secondary | ICD-10-CM | POA: Diagnosis not present

## 2017-07-17 DIAGNOSIS — I493 Ventricular premature depolarization: Secondary | ICD-10-CM | POA: Diagnosis not present

## 2017-07-17 DIAGNOSIS — Z955 Presence of coronary angioplasty implant and graft: Secondary | ICD-10-CM | POA: Diagnosis not present

## 2017-07-17 DIAGNOSIS — I10 Essential (primary) hypertension: Secondary | ICD-10-CM | POA: Diagnosis not present

## 2017-07-17 DIAGNOSIS — E785 Hyperlipidemia, unspecified: Secondary | ICD-10-CM | POA: Diagnosis not present

## 2017-07-17 DIAGNOSIS — M19041 Primary osteoarthritis, right hand: Secondary | ICD-10-CM | POA: Diagnosis not present

## 2017-07-17 DIAGNOSIS — Z7902 Long term (current) use of antithrombotics/antiplatelets: Secondary | ICD-10-CM | POA: Diagnosis not present

## 2017-07-17 HISTORY — DX: Personal history of urinary calculi: Z87.442

## 2017-07-17 HISTORY — DX: Gastro-esophageal reflux disease without esophagitis: K21.9

## 2017-07-17 HISTORY — DX: Acute myocardial infarction, unspecified: I21.9

## 2017-07-17 LAB — BASIC METABOLIC PANEL
ANION GAP: 11 (ref 5–15)
BUN: 23 mg/dL — ABNORMAL HIGH (ref 6–20)
CO2: 25 mmol/L (ref 22–32)
Calcium: 10 mg/dL (ref 8.9–10.3)
Chloride: 108 mmol/L (ref 101–111)
Creatinine, Ser: 1.06 mg/dL — ABNORMAL HIGH (ref 0.44–1.00)
GFR calc Af Amer: 56 mL/min — ABNORMAL LOW (ref >60)
GFR, EST NON AFRICAN AMERICAN: 49 mL/min — AB (ref >60)
Glucose, Bld: 107 mg/dL — ABNORMAL HIGH (ref 65–99)
POTASSIUM: 4.7 mmol/L (ref 3.5–5.1)
Sodium: 144 mmol/L (ref 135–145)

## 2017-07-17 MED ORDER — METOPROLOL TARTRATE 25 MG PO TABS: 25.0000 mg | ORAL_TABLET | Freq: Two times a day (BID) | ORAL | 3 refills | Status: DC

## 2017-07-17 NOTE — Telephone Encounter (Signed)
Pt's medication was sent to pt's pharmacy as requested. Confirmation received.  °

## 2017-07-17 NOTE — Progress Notes (Signed)
Clearance 07-09-17 cards epic  Echo 06-10-17 epic  Cath 06-15-17 Dr. Gwenlyn Found epic  cxr 1 view epic  ekg 07-02-17 epic  Cbc,bmp 07-02-17 epic

## 2017-07-17 NOTE — Patient Instructions (Signed)
Jacqueline Burke  07/17/2017   Your procedure is scheduled on: 07-20-17  Report to [facility] Main  Entrance  Report to admitting at     0800 AM   Call this number if you have problems the morning of surgery [phone]    Remember: Do not eat food or drink liquids :After Midnight.     Take these medicines the morning of surgery with A SIP OF WATER: Oxybutin, metoprolol, gabapentin, flonase, amlodipine, Brillinta                                You may not have any metal on your body including hair pins and              piercings  Do not wear jewelry, make-up, lotions, powders or perfumes, deodorant             Do not wear nail polish.  Do not shave  48 hours prior to surgery.     Do not bring valuables to the hospital. [place].  Contacts, dentures or bridgework may not be worn into surgery.      Patients discharged the day of surgery will not be allowed to drive home.  Name and phone number of your driver:  Special Instructions: N/A              Please read over the following fact sheets you were given: _____________________________________________________________________             [facility] - Preparing for Surgery Before surgery, you can play an important role.  Because skin is not sterile, your skin needs to be as free of germs as possible.  You can reduce the number of germs on your skin by washing with CHG (chlorahexidine gluconate) soap before surgery.  CHG is an antiseptic cleaner which kills germs and bonds with the skin to continue killing germs even after washing. Please DO NOT use if you have an allergy to CHG or antibacterial soaps.  If your skin becomes reddened/irritated stop using the CHG and inform your nurse when you arrive at Short Stay. Do not shave (including legs and underarms) for at least 48 hours prior to the first CHG shower.  You may shave your face/neck. Please follow these  instructions carefully:  1.  Shower with CHG Soap the night before surgery and the  morning of Surgery.  2.  If you choose to wash your hair, wash your hair first as usual with your  normal  shampoo.  3.  After you shampoo, rinse your hair and body thoroughly to remove the  shampoo.                           4.  Use CHG as you would any other liquid soap.  You can apply chg directly  to the skin and wash                       Gently with a scrungie or clean washcloth.  5.  Apply the CHG Soap to your body ONLY FROM THE NECK DOWN.   Do not use on face/ open  Wound or open sores. Avoid contact with eyes, ears mouth and genitals (private parts).                       Wash face,  Genitals (private parts) with your normal soap.             6.  Wash thoroughly, paying special attention to the area where your surgery  will be performed.  7.  Thoroughly rinse your body with warm water from the neck down.  8.  DO NOT shower/wash with your normal soap after using and rinsing off  the CHG Soap.                9.  Pat yourself dry with a clean towel.            10.  Wear clean pajamas.            11.  Place clean sheets on your bed the night of your first shower and do not  sleep with pets. Day of Surgery : Do not apply any lotions/deodorants the morning of surgery.  Please wear clean clothes to the hospital/surgery center.  FAILURE TO FOLLOW THESE INSTRUCTIONS MAY RESULT IN THE CANCELLATION OF YOUR SURGERY PATIENT SIGNATURE_________________________________  NURSE SIGNATURE__________________________________  ________________________________________________________________________

## 2017-07-20 ENCOUNTER — Ambulatory Visit (HOSPITAL_COMMUNITY): Payer: PPO

## 2017-07-20 ENCOUNTER — Encounter (HOSPITAL_COMMUNITY): Payer: Self-pay | Admitting: Emergency Medicine

## 2017-07-20 ENCOUNTER — Other Ambulatory Visit: Payer: Self-pay

## 2017-07-20 ENCOUNTER — Ambulatory Visit (HOSPITAL_COMMUNITY)
Admission: RE | Admit: 2017-07-20 | Discharge: 2017-07-20 | Disposition: A | Discharge status: Home / Self Care | Payer: PPO | Source: Ambulatory Visit | Attending: Urology | Admitting: Urology

## 2017-07-20 ENCOUNTER — Ambulatory Visit (HOSPITAL_COMMUNITY): Payer: PPO | Admitting: Anesthesiology

## 2017-07-20 ENCOUNTER — Encounter (HOSPITAL_COMMUNITY)
Admission: RE | Disposition: A | Discharge status: Home / Self Care | Payer: Self-pay | Source: Ambulatory Visit | Attending: Urology

## 2017-07-20 DIAGNOSIS — R011 Cardiac murmur, unspecified: Secondary | ICD-10-CM | POA: Diagnosis not present

## 2017-07-20 DIAGNOSIS — Z96653 Presence of artificial knee joint, bilateral: Secondary | ICD-10-CM | POA: Insufficient documentation

## 2017-07-20 DIAGNOSIS — M19041 Primary osteoarthritis, right hand: Secondary | ICD-10-CM | POA: Diagnosis not present

## 2017-07-20 DIAGNOSIS — I493 Ventricular premature depolarization: Secondary | ICD-10-CM | POA: Diagnosis not present

## 2017-07-20 DIAGNOSIS — Z79899 Other long term (current) drug therapy: Secondary | ICD-10-CM | POA: Insufficient documentation

## 2017-07-20 DIAGNOSIS — Z885 Allergy status to narcotic agent status: Secondary | ICD-10-CM | POA: Insufficient documentation

## 2017-07-20 DIAGNOSIS — N201 Calculus of ureter: Secondary | ICD-10-CM | POA: Insufficient documentation

## 2017-07-20 DIAGNOSIS — I10 Essential (primary) hypertension: Secondary | ICD-10-CM | POA: Diagnosis not present

## 2017-07-20 DIAGNOSIS — Z955 Presence of coronary angioplasty implant and graft: Secondary | ICD-10-CM | POA: Insufficient documentation

## 2017-07-20 DIAGNOSIS — Z7982 Long term (current) use of aspirin: Secondary | ICD-10-CM | POA: Insufficient documentation

## 2017-07-20 DIAGNOSIS — Z881 Allergy status to other antibiotic agents status: Secondary | ICD-10-CM | POA: Insufficient documentation

## 2017-07-20 DIAGNOSIS — Z8719 Personal history of other diseases of the digestive system: Secondary | ICD-10-CM | POA: Diagnosis not present

## 2017-07-20 DIAGNOSIS — M8580 Other specified disorders of bone density and structure, unspecified site: Secondary | ICD-10-CM | POA: Insufficient documentation

## 2017-07-20 DIAGNOSIS — I252 Old myocardial infarction: Secondary | ICD-10-CM | POA: Insufficient documentation

## 2017-07-20 DIAGNOSIS — I119 Hypertensive heart disease without heart failure: Secondary | ICD-10-CM | POA: Diagnosis not present

## 2017-07-20 DIAGNOSIS — J9601 Acute respiratory failure with hypoxia: Secondary | ICD-10-CM | POA: Diagnosis not present

## 2017-07-20 DIAGNOSIS — Z7902 Long term (current) use of antithrombotics/antiplatelets: Secondary | ICD-10-CM | POA: Insufficient documentation

## 2017-07-20 DIAGNOSIS — Z87891 Personal history of nicotine dependence: Secondary | ICD-10-CM | POA: Insufficient documentation

## 2017-07-20 DIAGNOSIS — K219 Gastro-esophageal reflux disease without esophagitis: Secondary | ICD-10-CM | POA: Diagnosis not present

## 2017-07-20 DIAGNOSIS — Z888 Allergy status to other drugs, medicaments and biological substances status: Secondary | ICD-10-CM | POA: Insufficient documentation

## 2017-07-20 DIAGNOSIS — E785 Hyperlipidemia, unspecified: Secondary | ICD-10-CM | POA: Insufficient documentation

## 2017-07-20 HISTORY — PX: CYSTOSCOPY WITH RETROGRADE PYELOGRAM, URETEROSCOPY AND STENT PLACEMENT: SHX5789

## 2017-07-20 HISTORY — PX: HOLMIUM LASER APPLICATION: SHX5852

## 2017-07-20 SURGERY — CYSTOURETEROSCOPY, WITH RETROGRADE PYELOGRAM AND STENT INSERTION
Anesthesia: General | Laterality: Left

## 2017-07-20 MED ORDER — SCOPOLAMINE 1 MG/3DAYS TD PT72
1.0000 | MEDICATED_PATCH | TRANSDERMAL | Status: DC
  Administered 2017-07-20: 1.5 mg via TRANSDERMAL
  Filled 2017-07-20: qty 1

## 2017-07-20 MED ORDER — HYDROCODONE-ACETAMINOPHEN 5-325 MG PO TABS: 1.0000 | ORAL_TABLET | ORAL | 0 refills | Status: DC | PRN

## 2017-07-20 MED ORDER — CEFTRIAXONE SODIUM 2 G IJ SOLR
2.0000 g | INTRAMUSCULAR | Status: AC
  Administered 2017-07-20: 2 g via INTRAVENOUS
  Filled 2017-07-20: qty 20

## 2017-07-20 MED ORDER — FENTANYL CITRATE (PF) 100 MCG/2ML IJ SOLN: 25.0000 ug | INTRAMUSCULAR | Status: DC | PRN

## 2017-07-20 MED ORDER — LIDOCAINE 2% (20 MG/ML) 5 ML SYRINGE
INTRAMUSCULAR | Status: AC
  Filled 2017-07-20: qty 5

## 2017-07-20 MED ORDER — EPHEDRINE SULFATE 50 MG/ML IJ SOLN
INTRAMUSCULAR | Status: DC | PRN
  Administered 2017-07-20 (×3): 10 mg via INTRAVENOUS

## 2017-07-20 MED ORDER — FENTANYL CITRATE (PF) 100 MCG/2ML IJ SOLN
INTRAMUSCULAR | Status: DC | PRN
  Administered 2017-07-20: 50 ug via INTRAVENOUS

## 2017-07-20 MED ORDER — TAMSULOSIN HCL 0.4 MG PO CAPS: 0.4000 mg | ORAL_CAPSULE | Freq: Every day | ORAL | 3 refills | Status: DC

## 2017-07-20 MED ORDER — TAMSULOSIN HCL 0.4 MG PO CAPS
0.4000 mg | ORAL_CAPSULE | Freq: Every day | ORAL | Status: DC
  Administered 2017-07-20: 0.4 mg via ORAL
  Filled 2017-07-20: qty 1

## 2017-07-20 MED ORDER — PROPOFOL 10 MG/ML IV BOLUS
INTRAVENOUS | Status: AC
  Filled 2017-07-20: qty 20

## 2017-07-20 MED ORDER — PROMETHAZINE HCL 25 MG/ML IJ SOLN: 6.2500 mg | INTRAMUSCULAR | Status: DC | PRN

## 2017-07-20 MED ORDER — GLYCOPYRROLATE 0.2 MG/ML IJ SOLN
INTRAMUSCULAR | Status: DC | PRN
  Administered 2017-07-20: 0.2 mg via INTRAVENOUS

## 2017-07-20 MED ORDER — LIDOCAINE 2% (20 MG/ML) 5 ML SYRINGE
INTRAMUSCULAR | Status: DC | PRN
  Administered 2017-07-20: 100 mg via INTRAVENOUS

## 2017-07-20 MED ORDER — FENTANYL CITRATE (PF) 100 MCG/2ML IJ SOLN
INTRAMUSCULAR | Status: AC
  Filled 2017-07-20: qty 2

## 2017-07-20 MED ORDER — PROPOFOL 10 MG/ML IV BOLUS
INTRAVENOUS | Status: DC | PRN
  Administered 2017-07-20: 150 mg via INTRAVENOUS

## 2017-07-20 MED ORDER — ONDANSETRON HCL 4 MG/2ML IJ SOLN
INTRAMUSCULAR | Status: DC | PRN
  Administered 2017-07-20: 4 mg via INTRAVENOUS

## 2017-07-20 MED ORDER — SODIUM CHLORIDE 0.9 % IV SOLN
INTRAVENOUS | Status: DC | PRN
  Administered 2017-07-20: 25 ug/min via INTRAVENOUS

## 2017-07-20 MED ORDER — SODIUM CHLORIDE 0.9 % IR SOLN
Status: DC | PRN
  Administered 2017-07-20 (×2): 3000 mL via INTRAVESICAL

## 2017-07-20 MED ORDER — ONDANSETRON HCL 4 MG/2ML IJ SOLN
INTRAMUSCULAR | Status: AC
  Filled 2017-07-20: qty 2

## 2017-07-20 MED ORDER — LACTATED RINGERS IV SOLN
INTRAVENOUS | Status: DC
  Administered 2017-07-20: 09:00:00 via INTRAVENOUS

## 2017-07-20 MED ORDER — PHENYLEPHRINE HCL 10 MG/ML IJ SOLN
INTRAMUSCULAR | Status: AC
  Filled 2017-07-20: qty 1

## 2017-07-20 MED ORDER — IOHEXOL 300 MG/ML  SOLN
INTRAMUSCULAR | Status: DC | PRN
  Administered 2017-07-20: 10 mL via INTRAVENOUS

## 2017-07-20 SURGICAL SUPPLY — 26 items
BAG URO CATCHER STRL LF (MISCELLANEOUS) ×3 IMPLANT
BASKET LASER NITINOL 1.9FR (BASKET) ×3 IMPLANT
BASKET ZERO TIP NITINOL 2.4FR (BASKET) IMPLANT
BSKT STON RTRVL 120 1.9FR (BASKET) ×1
BSKT STON RTRVL ZERO TP 2.4FR (BASKET)
CATH INTERMIT  6FR 70CM (CATHETERS) IMPLANT
CATH URET 5FR 28IN CONE TIP (BALLOONS)
CATH URET 5FR 70CM CONE TIP (BALLOONS) IMPLANT
CLOTH BEACON ORANGE TIMEOUT ST (SAFETY) ×6 IMPLANT
COVER FOOTSWITCH UNIV (MISCELLANEOUS) ×3 IMPLANT
COVER SURGICAL LIGHT HANDLE (MISCELLANEOUS) ×3 IMPLANT
FIBER LASER FLEXIVA 365 (UROLOGICAL SUPPLIES) IMPLANT
FIBER LASER TRAC TIP (UROLOGICAL SUPPLIES) ×2 IMPLANT
GOWN STRL REUS W/TWL LRG LVL3 (GOWN DISPOSABLE) ×3 IMPLANT
GOWN STRL REUS W/TWL XL LVL3 (GOWN DISPOSABLE) IMPLANT
GUIDEWIRE ANG ZIPWIRE 038X150 (WIRE) IMPLANT
GUIDEWIRE STR DUAL SENSOR (WIRE) ×6 IMPLANT
INFUSOR MANOMETER BAG 3000ML (MISCELLANEOUS) ×1 IMPLANT
MANIFOLD NEPTUNE II (INSTRUMENTS) ×3 IMPLANT
PACK CYSTO (CUSTOM PROCEDURE TRAY) ×3 IMPLANT
SHEATH ACCESS URETERAL 24CM (SHEATH) IMPLANT
SHEATH ACCESS URETERAL 54CM (SHEATH) IMPLANT
SHEATH URETERAL 12FRX35CM (MISCELLANEOUS) ×2 IMPLANT
STENT URET 6FRX24 CONTOUR (STENTS) ×2 IMPLANT
SYRINGE 10CC LL (SYRINGE) ×3 IMPLANT
TUBE FEEDING 8FR 16IN STR KANG (MISCELLANEOUS) ×1 IMPLANT

## 2017-07-20 NOTE — H&P (Signed)
H&P  Chief Complaint: Left ureteral calculus  History of Present Illness: 82 year old female status post left ureteral stent placement emergently for a left ureteral stone with associated sepsis.  Postoperatively this was complicated by severe sepsis, ICU stay, reintubation, and an MI.  She had a cardiac stent placed after her MI and is now on aspirin and Plavix.  She has had surgical clearance from her cardiologist.  She is to remain on her aspirin and Plavix for the planned left ureteroscopy with laser lithotripsy and ureteral stent placement.  She presents today to have this done.  She has no complaints except for occasional vaginal discomfort as a result of the stent.  Past Medical History:  Diagnosis Date  . Allergic rhinitis   . Chronic chest pain    and DOE w normal Stress cardiolyte  . Diverticulosis   . GERD (gastroesophageal reflux disease)   . Heart murmur    Systolic heart murmur with Aortic valve sclerosis by ECHO  . History of kidney stones   . Hyperlipidemia   . Hypertension   . Myocardial infarction ([place])    Stent x 1  ( 06-15-17)  . Osteoarthritis    Erosive OA-MRI of R hand negative for synovitis, only OA-Dr Trudie Reed  . Osteopenia   . Pelvic prolapse   . PVC's (premature ventricular contractions)    Past Surgical History:  Procedure Laterality Date  . BUNIONECTOMY  08/2006   R foot and hammer roe right second toe  . CARPAL TUNNEL RELEASE  2002   bil hands  . CORONARY STENT INTERVENTION N/A 06/15/2017   Procedure: CORONARY STENT INTERVENTION;  Surgeon: Lorretta Harp, MD;  Location: [place] [facility];  Service: Cardiovascular;  Laterality: N/A;  . CYSTOSCOPY     with laser lithotripsy Dr. Gloriann Loan 07-20-17   . CYSTOSCOPY W/ URETERAL STENT PLACEMENT Left 06/09/2017   Procedure: CYSTOSCOPY WITH RETROGRADE PYELOGRAM/URETERAL STENT PLACEMENT;  Surgeon: Lucas Mallow, MD;  Location: [facility];  Service: Urology;  Laterality: Left;  . LEFT HEART CATH AND CORONARY  ANGIOGRAPHY N/A 06/15/2017   Procedure: LEFT HEART CATH AND CORONARY ANGIOGRAPHY;  Surgeon: Lorretta Harp, MD;  Location: [place] [facility];  Service: Cardiovascular;  Laterality: N/A;  . TOTAL KNEE ARTHROPLASTY Left 08/2007  . TOTAL KNEE ARTHROPLASTY Right 03/2010    Home Medications:  Medications Prior to Admission  Medication Sig Dispense Refill Last Dose  . acetaminophen (TYLENOL) 500 MG tablet Take 1,000 mg by mouth every 8 (eight) hours as needed for moderate pain or headache.   Past Week at Unknown time  . amitriptyline (ELAVIL) 10 MG tablet Take 20 mg by mouth at bedtime.    07/19/2017 at Unknown time  . amLODipine (NORVASC) 5 MG tablet Take 1 tablet (5 mg total) by mouth daily. 90 tablet 3 07/20/2017 at 0715  . Ascorbic Acid (VITAMIN C PO) Take 1,000 mg by mouth daily.    07/19/2017 at Unknown time  . atorvastatin (LIPITOR) 80 MG tablet Take 1 tablet (80 mg total) by mouth daily at 6 PM. 30 tablet 0 07/19/2017 at Unknown time  . Cholecalciferol (VITAMIN D PO) Take 5,000 Units by mouth daily.    07/19/2017 at Unknown time  . docusate sodium (COLACE) 100 MG capsule Take 200 mg by mouth daily.    07/19/2017 at Unknown time  . FIBER PO Take 2 tablets by mouth daily.   07/19/2017 at Unknown time  . fluticasone (FLONASE) 50 MCG/ACT nasal spray Place 2 sprays  into both nostrils daily. 16 g 2 07/20/2017 at 0715  . gabapentin (NEURONTIN) 100 MG capsule Take 100 mg by mouth 2 (two) times daily.    07/20/2017 at [place]  . metoprolol tartrate (LOPRESSOR) 25 MG tablet Take 1 tablet (25 mg total) by mouth 2 (two) times daily. 180 tablet 3 07/20/2017 at 0715  . oxybutynin (DITROPAN) 5 MG tablet Take 5 mg by mouth 2 (two) times daily.   07/20/2017 at [facility]  . ticagrelor (BRILINTA) 90 MG TABS tablet Take 1 tablet (90 mg total) by mouth 2 (two) times daily. 60 tablet 10 07/20/2017 at 0715  . aspirin EC 81 MG tablet Take 81 mg by mouth daily. On hold for procedure   07/15/2017  . ondansetron (ZOFRAN ODT) 4 MG  disintegrating tablet Take 1 tablet (4 mg total) by mouth every 8 (eight) hours as needed for nausea or vomiting. (Patient not taking: Reported on 07/13/2017) 10 tablet 0 Completed Course at Unknown time   Allergies:  Allergies  Allergen Reactions  . Ciprofloxacin Hives  . Codeine Hives  . Demerol [Meperidine] Hives  . Darvon [Propoxyphene] Other (See Comments)    Unknown  . Hydrogen Peroxide Other (See Comments)    White blisters  . Tramadol Other (See Comments)    "ITCHY ON THE INSIDE"    Family History  Problem Relation Age of Onset  . Stroke Mother   . Diabetes Mother   . Breast cancer Cousin    Social History:  reports that she quit smoking about 24 years ago. Her smoking use included cigarettes. She has a 25.00 pack-year smoking history. she has never used smokeless tobacco. She reports that she does not drink alcohol or use drugs.  ROS: A complete review of systems was performed.  All systems are negative except for pertinent findings as noted. ROS   Physical Exam:  Vital signs in last 24 hours: Temp:  [97.6 F (36.4 C)] 97.6 F (36.4 C) (02/18 0907) Pulse Rate:  [66] 66 (02/18 0812) Resp:  [16] 16 (02/18 0812) BP: (167-180)/(81) 167/81 (02/18 0818) SpO2:  [95 %] 95 % (02/18 0812) Weight:  [73 kg (161 lb)] 73 kg (161 lb) (02/18 0907) General:  Alert and oriented, No acute distress HEENT: Normocephalic, atraumatic Neck: No JVD or lymphadenopathy Cardiovascular: Regular rate and rhythm Lungs: Regular rate and effort Abdomen: Soft, nontender, nondistended, no abdominal masses Back: No CVA tenderness Extremities: No edema Neurologic: Grossly intact  Laboratory Data:  No results found for this or any previous visit (from the past 24 hour(s)). No results found for this or any previous visit (from the past 240 hour(s)). Creatinine: Recent Labs    07/17/17 0925  CREATININE 1.06*    Impression/Assessment:  Left ureteral calculus  Plan:  Proceed with  cystoscopy, left retrograde pyelogram, left ureteroscopy, laser lithotripsy, ureteral stent exchange.  We have previously discussed the potential risk involved she has no further questions today.  Marton Redwood, III 07/20/2017, 9:57 AM

## 2017-07-20 NOTE — Op Note (Signed)
Operative Note  Preoperative diagnosis:  1.  Left ureteral calculus   Postoperative diagnosis: 1.  Left ureteral calculus  Procedure(s): 1.  Cystoscopy with left retrograde pyelogram, left ureteroscopy with laser lithotripsy, ureteral stent exchange  Surgeon: Link Snuffer, MD  Assistants: None  Anesthesia: Normal  Complications: None immediate  EBL: Minimal  Specimens: 1.  None  Drains/Catheters: 1.  6 x 24 double-J ureteral stent without a string  Intraoperative findings: Normal urethra and bladder.  Left retrograde pyelogram revealed a well opacified kidney without obvious filling defect except for where the presence of the stone was.  The stone was radiopaque.  It was fragmented to less than 1 mm fragments.  Indication: 82 year old female status post emergent left ureteral stent placement for a obstructing left ureteral calculus and associated sepsis.  Postoperatively this was complicated by reintubation, severe sepsis, ICU stay, MI.  She underwent a cardiac stent placement in the hospital and is now on aspirin and ticagrelor.  She presents for the previously mentioned operation.  Description of procedure:  The patient was identified and consent was obtained.  The patient was taken to the operating room and placed in the supine position.  The patient was placed under general anesthesia.  Perioperative antibiotics were administered.  The patient was placed in dorsal lithotomy.  Patient was prepped and draped in a standard sterile fashion and a timeout was performed.  A 21 French rigid cystoscope was advanced into the urethra and into the bladder.  The stent was grasped and pulled just beyond the urethral meatus.  A sensor wire was advanced through the stent and up to the kidney under fluoroscopic guidance.  The remainder of the stent was withdrawn.  Secured the wire to the drape as a safety wire.  A semirigid ureteroscope was advanced alongside the wire and up to the level of the  renal pelvis.  No stone in the ureter was seen indicating that the stone had been pushed back into the left kidney.  I shot a retrograde pyelogram through the scope.  I then advanced a second sensor wire through the scope and up to the kidney under fluoroscopic guidance.  I withdrew the scope.  I then advanced a 12 x 14 ureteral access sheath over the wire under continuous fluoroscopic guidance and withdrew the inner portion of the sheath along with the wire.  Flexible ureteroscopy was performed and the stone was encountered.  This was fragmented to less than 1 mm fragments.  I performed a complete pyeloscopy and no other significant stone fragments were seen.  I then carefully withdrew the scope along with the access sheath and no significant ureteral injury or ureteral calculi were seen.  I then backloaded the wire onto a rigid cystoscope and advanced that into the bladder.  I then placed a 6 x 24 double-J ureteral stent and withdrew the wire.  Fluoroscopy confirmed good placement within the upper pole of the kidney and direct visualization confirmed a good coil within the bladder.  The bladder was drained and the scope withdrawn.  This concluded the operation.  Patient tolerated procedure well was stable postoperatively.  Plan: Patient will return in 1 week for a stent removal.  She would then need to follow-up with me in 4-6 weeks for a renal ultrasound and KUB.

## 2017-07-20 NOTE — OR Nursing (Signed)
PREVIOUS URETERAL STENT REMOVED BY MD AT 1017

## 2017-07-20 NOTE — Anesthesia Procedure Notes (Signed)
Procedure Name: LMA Insertion Date/Time: 07/20/2017 10:10 AM Performed by: Maxwell Caul, CRNA Pre-anesthesia Checklist: Patient identified, Emergency Drugs available, Suction available and Patient being monitored Patient Re-evaluated:Patient Re-evaluated prior to induction Oxygen Delivery Method: Circle system utilized Preoxygenation: Pre-oxygenation with 100% oxygen Induction Type: IV induction LMA: LMA inserted LMA Size: 4.0 Number of attempts: 1 Placement Confirmation: positive ETCO2 and breath sounds checked- equal and bilateral Tube secured with: Tape Dental Injury: Teeth and Oropharynx as per pre-operative assessment

## 2017-07-20 NOTE — Discharge Instructions (Addendum)

## 2017-07-20 NOTE — Anesthesia Postprocedure Evaluation (Signed)
Anesthesia Post Note  Patient: Jacqueline Burke  Procedure(s) Performed: CYSTOSCOPY WITH RETROGRADE PYELOGRAM, LEFT URETEROSCOPY HOLMIUM LASER LITHO  AND STENT EXCHANGE (Left ) HOLMIUM LASER APPLICATION (Left )     Patient location during evaluation: PACU Anesthesia Type: General Level of consciousness: awake and alert Pain management: pain level controlled Vital Signs Assessment: post-procedure vital signs reviewed and stable Respiratory status: spontaneous breathing, nonlabored ventilation, respiratory function stable and patient connected to nasal cannula oxygen Cardiovascular status: blood pressure returned to baseline and stable Postop Assessment: no apparent nausea or vomiting Anesthetic complications: no    Last Vitals:  Vitals:   07/20/17 1115 07/20/17 1130  BP: 128/67 135/67  Pulse: 70 67  Resp: 17 16  Temp:    SpO2: 100% 100%    Last Pain:  Vitals:   07/20/17 1100  TempSrc:   PainSc: 0-No pain                 Analisa Sledd S

## 2017-07-20 NOTE — Anesthesia Preprocedure Evaluation (Addendum)
Anesthesia Evaluation  Patient identified by MRN, date of birth, ID band Patient awake    Reviewed: Allergy & Precautions, NPO status , Patient's Chart, lab work & pertinent test results, reviewed documented beta blocker date and time   History of Anesthesia Complications Negative for: history of anesthetic complications  Airway Mallampati: II  TM Distance: >3 FB Neck ROM: Full    Dental no notable dental hx. (+) Dental Advisory Given   Pulmonary neg pulmonary ROS, former smoker,    Pulmonary exam normal breath sounds clear to auscultation       Cardiovascular hypertension, Pt. on medications and Pt. on home beta blockers + Past MI and + Cardiac Stents   Rhythm:Regular Rate:Normal + Systolic murmurs 9509 Echo: Nl LV, Valves ok   Neuro/Psych negative neurological ROS  negative psych ROS   GI/Hepatic Neg liver ROS, GERD  ,  Endo/Other  negative endocrine ROS  Renal/GU negative Renal ROS  negative genitourinary   Musculoskeletal negative musculoskeletal ROS (+)   Abdominal   Peds negative pediatric ROS (+)  Hematology negative hematology ROS (+)   Anesthesia Other Findings   Reproductive/Obstetrics negative OB ROS                            Anesthesia Physical  Anesthesia Plan  ASA: III  Anesthesia Plan: General   Post-op Pain Management:    Induction: Intravenous  PONV Risk Score and Plan: 2 and Treatment may vary due to age or medical condition, Ondansetron, Dexamethasone and Scopolamine patch - Pre-op  Airway Management Planned: LMA  Additional Equipment:   Intra-op Plan:   Post-operative Plan: Extubation in OR  Informed Consent: I have reviewed the patients History and Physical, chart, labs and discussed the procedure including the risks, benefits and alternatives for the proposed anesthesia with the patient or authorized representative who has indicated his/her  understanding and acceptance.   Dental advisory given  Plan Discussed with: CRNA, Anesthesiologist and Surgeon  Anesthesia Plan Comments:        Anesthesia Quick Evaluation

## 2017-07-20 NOTE — Interval H&P Note (Signed)
History and Physical Interval Note:  07/20/2017 9:59 AM  Jacqueline Burke  has presented today for surgery, with the diagnosis of LEFT URETERAL STONE  The various methods of treatment have been discussed with the patient and family. After consideration of risks, benefits and other options for treatment, the patient has consented to  Procedure(s): CYSTOSCOPY WITH RETROGRADE PYELOGRAM, LEFT URETEROSCOPY HOLMIUM LASER LITHO  AND STENT PLACEMENT (Left) HOLMIUM LASER APPLICATION (Left) as a surgical intervention .  The patient's history has been reviewed, patient examined, no change in status, stable for surgery.  I have reviewed the patient's chart and labs.  Questions were answered to the patient's satisfaction.     Marton Redwood, III

## 2017-07-20 NOTE — Transfer of Care (Signed)
Immediate Anesthesia Transfer of Care Note  Patient: Jacqueline Burke  Procedure(s) Performed: CYSTOSCOPY WITH RETROGRADE PYELOGRAM, LEFT URETEROSCOPY HOLMIUM LASER LITHO  AND STENT EXCHANGE (Left ) HOLMIUM LASER APPLICATION (Left )  Patient Location: PACU  Anesthesia Type:General  Level of Consciousness: awake, alert  and oriented  Airway & Oxygen Therapy: Patient Spontanous Breathing and Patient connected to face mask oxygen  Post-op Assessment: Report given to RN and Post -op Vital signs reviewed and stable  Post vital signs: Reviewed and stable  Last Vitals:  Vitals:   07/20/17 0818 07/20/17 0907  BP: (!) 167/81   Pulse:    Resp:    Temp:  36.4 C  SpO2:      Last Pain:  Vitals:   07/20/17 0812  TempSrc: Oral      Patients Stated Pain Goal: 4 (60/15/61 5379)  Complications: No apparent anesthesia complications

## 2017-07-21 ENCOUNTER — Encounter (HOSPITAL_COMMUNITY): Payer: Self-pay | Admitting: Urology

## 2017-07-27 DIAGNOSIS — N201 Calculus of ureter: Secondary | ICD-10-CM | POA: Diagnosis not present

## 2017-08-18 ENCOUNTER — Telehealth (HOSPITAL_COMMUNITY): Payer: Self-pay

## 2017-08-18 NOTE — Telephone Encounter (Signed)
Patient called to inform us that the stent has been taken out and procedure went well. Passed referral to [employer].

## 2017-08-20 DIAGNOSIS — I700 Atherosclerosis of aorta: Secondary | ICD-10-CM | POA: Diagnosis not present

## 2017-08-20 DIAGNOSIS — Z0000 Encounter for general adult medical examination without abnormal findings: Secondary | ICD-10-CM | POA: Diagnosis not present

## 2017-08-20 DIAGNOSIS — Z955 Presence of coronary angioplasty implant and graft: Secondary | ICD-10-CM | POA: Diagnosis not present

## 2017-08-20 DIAGNOSIS — E7800 Pure hypercholesterolemia, unspecified: Secondary | ICD-10-CM | POA: Diagnosis not present

## 2017-08-20 DIAGNOSIS — E559 Vitamin D deficiency, unspecified: Secondary | ICD-10-CM | POA: Diagnosis not present

## 2017-08-20 DIAGNOSIS — K5900 Constipation, unspecified: Secondary | ICD-10-CM | POA: Diagnosis not present

## 2017-08-20 DIAGNOSIS — I10 Essential (primary) hypertension: Secondary | ICD-10-CM | POA: Diagnosis not present

## 2017-08-20 DIAGNOSIS — I2510 Atherosclerotic heart disease of native coronary artery without angina pectoris: Secondary | ICD-10-CM | POA: Diagnosis not present

## 2017-08-20 DIAGNOSIS — M810 Age-related osteoporosis without current pathological fracture: Secondary | ICD-10-CM | POA: Diagnosis not present

## 2017-08-20 DIAGNOSIS — E669 Obesity, unspecified: Secondary | ICD-10-CM | POA: Diagnosis not present

## 2017-08-20 DIAGNOSIS — M154 Erosive (osteo)arthritis: Secondary | ICD-10-CM | POA: Diagnosis not present

## 2017-08-20 DIAGNOSIS — I6529 Occlusion and stenosis of unspecified carotid artery: Secondary | ICD-10-CM | POA: Diagnosis not present

## 2017-08-25 ENCOUNTER — Encounter: Payer: Self-pay | Admitting: Cardiology

## 2017-08-25 ENCOUNTER — Ambulatory Visit: Payer: PPO | Admitting: Cardiology

## 2017-08-25 VITALS — BP 138/80 | HR 66 | Ht 61.0 in | Wt 166.0 lb

## 2017-08-25 DIAGNOSIS — E785 Hyperlipidemia, unspecified: Secondary | ICD-10-CM

## 2017-08-25 DIAGNOSIS — I2510 Atherosclerotic heart disease of native coronary artery without angina pectoris: Secondary | ICD-10-CM | POA: Diagnosis not present

## 2017-08-25 DIAGNOSIS — I119 Hypertensive heart disease without heart failure: Secondary | ICD-10-CM

## 2017-08-25 NOTE — Patient Instructions (Signed)
Medication Instructions:  Your physician recommends that you continue on your current medications as directed. Please refer to the Current Medication list given to you today.  If you need a refill on your cardiac medications, please contact your pharmacy first.  Labwork: None ordered   Testing/Procedures: None ordered   Follow-Up: Your physician wants you to follow-up in: 6 month with Dr. Radford Pax. You will receive a reminder letter in the mail two months in advance. If you don't receive a letter, please call our office to schedule the follow-up appointment.  Any Other Special Instructions Will Be Listed Below (If Applicable).   Thank you for choosing [facility], RN  [phone]  If you need a refill on your cardiac medications before your next appointment, please call your pharmacy.

## 2017-08-25 NOTE — Progress Notes (Signed)
Cardiology Office Note:    Date:  08/25/2017   ID:  Jacqueline Burke, DOB 1935-10-18, MRN [id number]  PCP:  Darcus Austin, MD  Cardiologist:  Fransico Him, MD    Referring MD: Darcus Austin, MD   Chief Complaint  Patient presents with  . Coronary Artery Disease  . Hypertension  . Hyperlipidemia    History of Present Illness:    Jacqueline Burke is a 82 y.o. female with a hx of hypertension, asymptomatic PVCs, ASCAD s/p NSTEMI with peak troponin of 19 in setting of acute respiratory failure and E Coli bacteremia in January 2019. 2D echo with normal LVEF, elevated LVEDP and cath with  95% RCA stenosis normal EF, EF 55-65% s/p DES to RCA. She is here today for followup and is doing well.  She denies any chest pain or pressure, PND, orthopnea, dizziness, palpitations or syncope. She has some LE edema if she stands for long periods of time.   She is compliant with her meds and is tolerating meds with no SE.  She has had a chronic cough since her hospitalization.  She has chronic DOE since her MI but is very sporadic and only when exerting too much.    Past Medical History:  Diagnosis Date  . Allergic rhinitis   . CAD (coronary artery disease), native coronary artery    NSTEMI with 95% RCA s/p DES to RCA 06/2017  . Diverticulosis   . GERD (gastroesophageal reflux disease)   . Heart murmur    Systolic heart murmur with Aortic valve sclerosis by ECHO  . History of kidney stones   . Hyperlipidemia   . Hypertension   . Osteoarthritis    Erosive OA-MRI of R hand negative for synovitis, only OA-Dr Trudie Reed  . Osteopenia   . Pelvic prolapse   . PVC's (premature ventricular contractions)     Past Surgical History:  Procedure Laterality Date  . BUNIONECTOMY  08/2006   R foot and hammer roe right second toe  . CARPAL TUNNEL RELEASE  2002   bil hands  . CORONARY STENT INTERVENTION N/A 06/15/2017   Procedure: CORONARY STENT INTERVENTION;  Surgeon: Lorretta Harp, MD;  Location: [facility];  Service: Cardiovascular;  Laterality: N/A;  . CYSTOSCOPY     with laser lithotripsy Dr. Gloriann Loan 07-20-17   . CYSTOSCOPY W/ URETERAL STENT PLACEMENT Left 06/09/2017   Procedure: CYSTOSCOPY WITH RETROGRADE PYELOGRAM/URETERAL STENT PLACEMENT;  Surgeon: Lucas Mallow, MD;  Location: [facility];  Service: Urology;  Laterality: Left;  . CYSTOSCOPY WITH RETROGRADE PYELOGRAM, URETEROSCOPY AND STENT PLACEMENT Left 07/20/2017   Procedure: CYSTOSCOPY WITH RETROGRADE PYELOGRAM, LEFT URETEROSCOPY HOLMIUM LASER LITHO  AND STENT EXCHANGE;  Surgeon: Lucas Mallow, MD;  Location: [facility];  Service: Urology;  Laterality: Left;  . HOLMIUM LASER APPLICATION Left 6/64/4034   Procedure: HOLMIUM LASER APPLICATION;  Surgeon: Lucas Mallow, MD;  Location: [facility];  Service: Urology;  Laterality: Left;  . LEFT HEART CATH AND CORONARY ANGIOGRAPHY N/A 06/15/2017   Procedure: LEFT HEART CATH AND CORONARY ANGIOGRAPHY;  Surgeon: Lorretta Harp, MD;  Location: [facility];  Service: Cardiovascular;  Laterality: N/A;  . TOTAL KNEE ARTHROPLASTY Left 08/2007  . TOTAL KNEE ARTHROPLASTY Right 03/2010    Current Medications: No outpatient medications have been marked as taking for the 08/25/17 encounter (Office Visit) with Sueanne Margarita, MD.     Allergies:   Ciprofloxacin; Codeine; Demerol [meperidine]; Darvon [propoxyphene]; Hydrogen peroxide; and  Tramadol   Social History   Socioeconomic History  . Marital status: Married    Spouse name: Not on file  . Number of children: Not on file  . Years of education: Not on file  . Highest education level: Not on file  Occupational History  . Not on file  Social Needs  . Financial resource strain: Not on file  . Food insecurity:    Worry: Not on file    Inability: Not on file  . Transportation needs:    Medical: Not on file    Non-medical: Not on file  Tobacco Use  . Smoking status: Former Smoker    Packs/day: 1.00    Years: 25.00    Pack years: 25.00      Types: Cigarettes    Last attempt to quit: 06/02/1993    Years since quitting: 24.2  . Smokeless tobacco: Never Used  Substance and Sexual Activity  . Alcohol use: No  . Drug use: No  . Sexual activity: Not Currently  Lifestyle  . Physical activity:    Days per week: Not on file    Minutes per session: Not on file  . Stress: Not on file  Relationships  . Social connections:    Talks on phone: Not on file    Gets together: Not on file    Attends religious service: Not on file    Active member of club or organization: Not on file    Attends meetings of clubs or organizations: Not on file    Relationship status: Not on file  Other Topics Concern  . Not on file  Social History Narrative  . Not on file     Family History: The patient's family history includes Breast cancer in her cousin; Diabetes in her mother; Stroke in her mother.  ROS:   Please see the history of present illness.    ROS  All other systems reviewed and negative.   EKGs/Labs/Other Studies Reviewed:    The following studies were reviewed today: none  EKG:  EKG is not ordered today.    Recent Labs: 06/14/2017: B Natriuretic Peptide 326.1 06/16/2017: ALT 47 07/02/2017: Hemoglobin 11.3; Magnesium 1.9; Platelets 256 07/17/2017: BUN 23; Creatinine, Ser 1.06; Potassium 4.7; Sodium 144   Recent Lipid Panel    Component Value Date/Time   CHOL 79 06/16/2017 0535   TRIG 116 06/16/2017 0535   HDL 23 (L) 06/16/2017 0535   CHOLHDL 3.4 06/16/2017 0535   VLDL 23 06/16/2017 0535   LDLCALC 33 06/16/2017 0535    Physical Exam:    VS:  BP 138/80   Pulse 66   Ht 5\' 1"  (1.549 m)   Wt 166 lb (75.3 kg)   SpO2 97%   BMI 31.37 kg/m     Wt Readings from Last 3 Encounters:  08/25/17 166 lb (75.3 kg)  07/20/17 161 lb (73 kg)  07/17/17 161 lb (73 kg)     GEN:  Well nourished, well developed in no acute distress HEENT: Normal NECK: No JVD; No carotid bruits LYMPHATICS: No lymphadenopathy CARDIAC: RRR, no  murmurs, rubs, gallops RESPIRATORY:  Clear to auscultation without rales, wheezing or rhonchi  ABDOMEN: Soft, non-tender, non-distended MUSCULOSKELETAL:  No edema; No deformity  SKIN: Warm and dry NEUROLOGIC:  Alert and oriented x 3 PSYCHIATRIC:  Normal affect   ASSESSMENT:    1. Coronary artery disease involving native coronary artery of native heart without angina pectoris   2. Benign hypertensive heart disease without heart failure  3. Hyperlipidemia LDL goal <70    PLAN:    In order of problems listed above:  1.  ASCAD - s/p NSTEMI in the setting of acute respiratory failure and bacteremia.  Cath with 95% RCA s/p DES.  She denies any angina sx.  She will continue on ASA 81mg  daily, Brilinta 90mg  BID, BB and statin.   2.  HTN - BP is well controlled on exam today.  SHe will continue on lopressor 25mg  BID and amlodipine 5mg  daily.    3. Hyperlipidemia with LDL goal < 70.  She will continue on atorvastatin 80mg  daily.  LDL 33 in January 2019.     Medication Adjustments/Labs and Tests Ordered: Current medicines are reviewed at length with the patient today.  Concerns regarding medicines are outlined above.  No orders of the defined types were placed in this encounter.  No orders of the defined types were placed in this encounter.   Signed, Fransico Him, MD  08/25/2017 11:38 AM    [facility]

## 2017-09-01 ENCOUNTER — Telehealth (HOSPITAL_COMMUNITY): Payer: Self-pay

## 2017-09-01 DIAGNOSIS — N3941 Urge incontinence: Secondary | ICD-10-CM | POA: Diagnosis not present

## 2017-09-01 DIAGNOSIS — N201 Calculus of ureter: Secondary | ICD-10-CM | POA: Diagnosis not present

## 2017-09-01 NOTE — Telephone Encounter (Signed)
Attempted to call patient in regards Cardiac Rehab - lm on vm

## 2017-09-22 DIAGNOSIS — N183 Chronic kidney disease, stage 3 (moderate): Secondary | ICD-10-CM | POA: Diagnosis not present

## 2017-10-21 ENCOUNTER — Telehealth (HOSPITAL_COMMUNITY): Payer: Self-pay | Admitting: Pharmacist

## 2017-10-21 NOTE — Telephone Encounter (Signed)
Cardiac Rehab Medication Review by a Pharmacist  Does the patient  feel that his/her medications are working for him/her?  yes  Has the patient been experiencing any side effects to the medications prescribed?  no  Does the patient measure his/her own blood pressure or blood glucose at home?  Yes, blood pressure meter at home  Does the patient have any problems obtaining medications due to transportation or finances?   no  Understanding of regimen: good Understanding of indications: good Potential of compliance: good    Pharmacist comments: Jacqueline Burke is a 82 y.o. female in high spirits this evening while discussing her medications and compliance for her upcoming cardiac rehab appointment. She had no concerns about her medications at this time and no further questions for me regarding medications or her appointment.     Jalene Mullet, Pharm.D. PGY1 Pharmacy Resident 10/21/2017 6:28 PM Main Pharmacy: [phone]

## 2017-10-29 ENCOUNTER — Encounter (HOSPITAL_COMMUNITY)
Admission: RE | Admit: 2017-10-29 | Discharge: 2017-10-29 | Disposition: A | Discharge status: Home / Self Care | Payer: PPO | Source: Ambulatory Visit | Attending: Cardiology | Admitting: Cardiology

## 2017-10-29 ENCOUNTER — Encounter (HOSPITAL_COMMUNITY): Payer: Self-pay

## 2017-10-29 VITALS — BP 142/82 | HR 61 | Ht 61.0 in | Wt 166.4 lb

## 2017-10-29 DIAGNOSIS — Z955 Presence of coronary angioplasty implant and graft: Secondary | ICD-10-CM | POA: Diagnosis not present

## 2017-10-29 DIAGNOSIS — I214 Non-ST elevation (NSTEMI) myocardial infarction: Secondary | ICD-10-CM

## 2017-10-29 NOTE — Progress Notes (Signed)
JASMAIN AHLBERG 82 y.o. female DOB: [birth year] MRN: [id number]      Nutrition Note  1. NSTEMI (non-ST elevated myocardial infarction) ([place]) 06/11/17   2. Status post coronary artery stent placement 06/15/17    Past Medical History:  Diagnosis Date  . Allergic rhinitis   . CAD (coronary artery disease), native coronary artery    NSTEMI with 95% RCA s/p DES to RCA 06/2017  . Diverticulosis   . GERD (gastroesophageal reflux disease)   . Heart murmur    Systolic heart murmur with Aortic valve sclerosis by ECHO  . History of kidney stones   . Hyperlipidemia   . Hypertension   . Osteoarthritis    Erosive OA-MRI of R hand negative for synovitis, only OA-Dr Trudie Reed  . Osteopenia   . Pelvic prolapse   . PVC's (premature ventricular contractions)    Meds reviewed.   HT: Ht Readings from Last 1 Encounters:  10/29/17 5\' 1"  (1.549 m)    WT: Wt Readings from Last 5 Encounters:  10/29/17 166 lb 7.2 oz (75.5 kg)  08/25/17 166 lb (75.3 kg)  07/20/17 161 lb (73 kg)  07/17/17 161 lb (73 kg)  07/02/17 160 lb 12.8 oz (72.9 kg)     Body mass index is 31.45 kg/m.   Current tobacco use? No    Labs:  Lipid Panel     Component Value Date/Time   CHOL 79 06/16/2017 0535   TRIG 116 06/16/2017 0535   HDL 23 (L) 06/16/2017 0535   CHOLHDL 3.4 06/16/2017 0535   VLDL 23 06/16/2017 0535   LDLCALC 33 06/16/2017 0535   No results found for: HGBA1C CBG (last 3)  No results for input(s): GLUCAP in the last 72 hours.  Nutrition Note Spoke with pt. Nutrition plan and goals reviewed with pt. Pt is following Step 2 of the Therapeutic Lifestyle Changes diet. Age-appropriate nutrition recommendations discussed. Pt expressed understanding of the information reviewed. Pt aware of nutrition education classes offered.  Nutrition Diagnosis ? Food-and nutrition-related knowledge deficit related to lack of exposure to information as related to diagnosis of: ? CVD ? Obesity related to excessive energy intake as  evidenced by a Body mass index is 31.45 kg/m.  Nutrition Intervention ? Pt's individual nutrition plan and goals reviewed with pt. ? Pt given handouts for: ? Nutrition I class ? Nutrition II class   Nutrition Goal(s):  ? Pt to identify food quantities necessary to achieve weight loss of 6-24 lb at graduation from cardiac rehab.   Plan:  Pt to attend nutrition classes ? Nutrition I ? Nutrition II ? Portion Distortion  Will provide client-centered nutrition education as part of interdisciplinary care.   Monitor and evaluate progress toward nutrition goal with team.  Derek Mound, M.Ed, RD, LDN, CDE 10/29/2017 3:42 PM

## 2017-10-29 NOTE — Progress Notes (Addendum)
Cardiac Individual Treatment Plan  Patient Details  Name: Jacqueline Burke MRN: [id number] Date of Birth: 03/26/1936 Referring Provider:     [facility] from 10/29/2017 in [place]  Referring Provider  Fransico Him R MD       Initial Encounter Date:    CARDIAC REHAB PHASE II ORIENTATION from 10/29/2017 in [place]  Date  10/29/17  Referring Provider  Fransico Him R MD       Visit Diagnosis: NSTEMI (non-ST elevated myocardial infarction) ([place]) 06/11/17  Status post coronary artery stent placement 06/15/17  Patient's Home Medications on Admission:  Current Outpatient Medications:  .  acetaminophen (TYLENOL) 500 MG tablet, Take 1,000 mg by mouth every 8 (eight) hours as needed for moderate pain or headache., Disp: , Rfl:  .  amitriptyline (ELAVIL) 10 MG tablet, Take 20 mg by mouth at bedtime. , Disp: , Rfl:  .  amLODipine (NORVASC) 5 MG tablet, Take 1 tablet (5 mg total) by mouth daily., Disp: 90 tablet, Rfl: 3 .  Ascorbic Acid (VITAMIN C PO), Take 1,000 mg by mouth daily. , Disp: , Rfl:  .  aspirin EC 81 MG tablet, Take 81 mg by mouth daily. On hold for procedure, Disp: , Rfl:  .  atorvastatin (LIPITOR) 80 MG tablet, Take 1 tablet (80 mg total) by mouth daily at 6 PM., Disp: 30 tablet, Rfl: 0 .  cetirizine (ZYRTEC) 10 MG chewable tablet, Chew 10 mg by mouth daily., Disp: , Rfl:  .  Cholecalciferol (VITAMIN D PO), Take 3,000 Units by mouth daily. , Disp: , Rfl:  .  docusate sodium (COLACE) 100 MG capsule, Take 200 mg by mouth daily. , Disp: , Rfl:  .  FIBER PO, Take 2 tablets by mouth daily., Disp: , Rfl:  .  fluticasone (FLONASE) 50 MCG/ACT nasal spray, Place 2 sprays into both nostrils daily., Disp: 16 g, Rfl: 2 .  gabapentin (NEURONTIN) 100 MG capsule, Take 100 mg by mouth 2 (two) times daily. , Disp: , Rfl:  .  HYDROcodone-acetaminophen (NORCO) 5-325 MG tablet, Take 1 tablet by mouth every 4  (four) hours as needed for moderate pain. (Patient not taking: Reported on 10/21/2017), Disp: 10 tablet, Rfl: 0 .  metoprolol tartrate (LOPRESSOR) 25 MG tablet, Take 1 tablet (25 mg total) by mouth 2 (two) times daily., Disp: 180 tablet, Rfl: 3 .  ondansetron (ZOFRAN ODT) 4 MG disintegrating tablet, Take 1 tablet (4 mg total) by mouth every 8 (eight) hours as needed for nausea or vomiting. (Patient not taking: Reported on 10/21/2017), Disp: 10 tablet, Rfl: 0 .  oxybutynin (DITROPAN) 5 MG tablet, Take 5 mg by mouth 2 (two) times daily., Disp: , Rfl:  .  tamsulosin (FLOMAX) 0.4 MG CAPS capsule, Take 1 capsule (0.4 mg total) by mouth daily., Disp: 10 capsule, Rfl: 3 .  ticagrelor (BRILINTA) 90 MG TABS tablet, Take 1 tablet (90 mg total) by mouth 2 (two) times daily., Disp: 60 tablet, Rfl: 10 .  trospium (SANCTURA) 20 MG tablet, Take 60 mg by mouth daily., Disp: , Rfl:   Past Medical History: Past Medical History:  Diagnosis Date  . Allergic rhinitis   . CAD (coronary artery disease), native coronary artery    NSTEMI with 95% RCA s/p DES to RCA 06/2017  . Diverticulosis   . GERD (gastroesophageal reflux disease)   . Heart murmur    Systolic heart murmur with Aortic valve sclerosis by ECHO  .  History of kidney stones   . Hyperlipidemia   . Hypertension   . Osteoarthritis    Erosive OA-MRI of R hand negative for synovitis, only OA-Dr Trudie Reed  . Osteopenia   . Pelvic prolapse   . PVC's (premature ventricular contractions)     Tobacco Use: Social History   Tobacco Use  Smoking Status Former Smoker  . Packs/day: 1.00  . Years: 25.00  . Pack years: 25.00  . Types: Cigarettes  . Last attempt to quit: 06/02/1993  . Years since quitting: 24.4  Smokeless Tobacco Never Used    Labs: Recent Chemical engineer    Labs for ITP Cardiac and Pulmonary Rehab Latest Ref Rng & Units 06/09/2017 06/16/2017   Cholestrol 0 - 200 mg/dL - 79   LDLCALC 0 - 99 mg/dL - 33   HDL >40 mg/dL - 23(L)    Trlycerides <150 mg/dL - 116   PHART 7.350 - 7.450 7.366 -   PCO2ART 32.0 - 48.0 mmHg 39.0 -   HCO3 20.0 - 28.0 mmol/L 21.9 -   TCO2 22 - 32 mmol/L 23 -   ACIDBASEDEF 0.0 - 2.0 mmol/L 3.0(H) -   O2SAT % 100.0 -      Capillary Blood Glucose: No results found for: GLUCAP   Exercise Target Goals: Date: 10/29/17  Exercise Program Goal: Individual exercise prescription set using results from initial 6 min walk test and THRR while considering  patient's activity barriers and safety.   Exercise Prescription Goal: Initial exercise prescription builds to 30-45 minutes a day of aerobic activity, 2-3 days per week.  Home exercise guidelines will be given to patient during program as part of exercise prescription that the participant will acknowledge.  Activity Barriers & Risk Stratification: Activity Barriers & Cardiac Risk Stratification - 10/29/17 1506      Activity Barriers & Cardiac Risk Stratification   Activity Barriers  Back Problems;Arthritis;Left Knee Replacement;Right Knee Replacement    Cardiac Risk Stratification  High       6 Minute Walk: 6 Minute Walk    Row Name 10/29/17 1454         6 Minute Walk   Phase  Initial     Distance  1239 feet     Walk Time  6 minutes     # of Rest Breaks  0     MPH  2.35     METS  1.99     RPE  13     Perceived Dyspnea   2     VO2 Peak  6.96     Symptoms  Yes (comment)     Comments  Sciatic Nerve Pain 6/10, SOB +2     Resting HR  61 bpm     Resting BP  142/82     Resting Oxygen Saturation   96 %     Exercise Oxygen Saturation  during 6 min walk  97 %     Max Ex. HR  88 bpm     Max Ex. BP  148/60     2 Minute Post BP  110/68        Oxygen Initial Assessment:   Oxygen Re-Evaluation:   Oxygen Discharge (Final Oxygen Re-Evaluation):   Initial Exercise Prescription: Initial Exercise Prescription - 10/29/17 1500      Date of Initial Exercise RX and Referring Provider   Date  10/29/17    Referring Provider  Sueanne Margarita MD       Recumbant Bike  Level  1.5    Watts  5    Minutes  10    METs  2      NuStep   Level  1    SPM  75    Minutes  10    METs  1.5      Track   Laps  8    Minutes  10    METs  2.38      Prescription Details   Frequency (times per week)  3x    Duration  Progress to 30 minutes of continuous aerobic without signs/symptoms of physical distress      Intensity   THRR 40-80% of Max Heartrate  56-113    Ratings of Perceived Exertion  11-13    Perceived Dyspnea  0-4      Progression   Progression  Continue progressive overload as per policy without signs/symptoms or physical distress.      Resistance Training   Training Prescription  Yes    Weight  2lbs    Reps  10-15       Perform Capillary Blood Glucose checks as needed.  Exercise Prescription Changes:   Exercise Comments:   Exercise Goals and Review:   Exercise Goals Re-Evaluation :    Discharge Exercise Prescription (Final Exercise Prescription Changes):   Nutrition:  Target Goals: Understanding of nutrition guidelines, daily intake of sodium 1500mg , cholesterol 200mg , calories 30% from fat and 7% or less from saturated fats, daily to have 5 or more servings of fruits and vegetables.  Biometrics: Pre Biometrics - 10/29/17 1506      Pre Biometrics   Height  5\' 1"  (1.549 m)    Weight  166 lb 7.2 oz (75.5 kg)    Waist Circumference  37 inches    Hip Circumference  43 inches    Waist to Hip Ratio  0.86 %    BMI (Calculated)  31.47    Triceps Skinfold  34 mm    % Body Fat  44.1 %    Grip Strength  24 kg    Flexibility  12 in    Single Leg Stand  15.5 seconds        Nutrition Therapy Plan and Nutrition Goals:   Nutrition Assessments:   Nutrition Goals Re-Evaluation:   Nutrition Goals Re-Evaluation:   Nutrition Goals Discharge (Final Nutrition Goals Re-Evaluation):   Psychosocial: Target Goals: Acknowledge presence or absence of significant depression and/or stress,  maximize coping skills, provide positive support system. Participant is able to verbalize types and ability to use techniques and skills needed for reducing stress and depression.  Initial Review & Psychosocial Screening: Initial Psych Review & Screening - 10/29/17 1544      Initial Review   Current issues with  None Identified      Family Dynamics   Good Support System?  Yes Collie Siad has her husband and children for support      Barriers   Psychosocial barriers to participate in program  There are no identifiable barriers or psychosocial needs.      Screening Interventions   Interventions  Encouraged to exercise       Quality of Life Scores: Quality of Life - 10/29/17 1512      Quality of Life Scores   Health/Function Pre  30 %    Socioeconomic Pre  30 %    Psych/Spiritual Pre  30 %    Family Pre  30 %    GLOBAL Pre  30 %  Scores of 19 and below usually indicate a poorer quality of life in these areas.  A difference of  2-3 points is a clinically meaningful difference.  A difference of 2-3 points in the total score of the Quality of Life Index has been associated with significant improvement in overall quality of life, self-image, physical symptoms, and general health in studies assessing change in quality of life.  PHQ-9: Recent Review Flowsheet Data    There is no flowsheet data to display.     Interpretation of Total Score  Total Score Depression Severity:  1-4 = Minimal depression, 5-9 = Mild depression, 10-14 = Moderate depression, 15-19 = Moderately severe depression, 20-27 = Severe depression   Psychosocial Evaluation and Intervention:   Psychosocial Re-Evaluation:   Psychosocial Discharge (Final Psychosocial Re-Evaluation):   Vocational Rehabilitation: Provide vocational rehab assistance to qualifying candidates.   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 Participant will state understanding/return demonstration of topics presented.  Learning Barriers/Preferences: Learning Barriers/Preferences - 10/29/17 1508      Learning Barriers/Preferences   Learning Barriers  Sight    Learning Preferences  Skilled Demonstration;Individual Instruction;Verbal Instruction;Written Material;Video       Education Topics: Count Your Pulse:  -Group instruction provided by verbal instruction, demonstration, patient participation and written materials to support subject.  Instructors address importance of being able to find your pulse and how to count your pulse when at home without a heart monitor.  Patients get hands on experience counting their pulse with staff help and individually.   Heart Attack, Angina, and Risk Factor Modification:  -Group instruction provided by verbal instruction, video, and written materials to support subject.  Instructors address signs and symptoms of angina and heart attacks.    Also discuss risk factors for heart disease and how to make changes to improve heart health risk factors.   Functional Fitness:  -Group instruction provided by verbal instruction, demonstration, patient participation, and written materials to support subject.  Instructors address safety measures for doing things around the house.  Discuss how to get up and down off the floor, how to pick things up properly, how to safely get out of a chair without assistance, and balance training.   Meditation and Mindfulness:  -Group instruction provided by verbal instruction, patient participation, and written materials to support subject.  Instructor addresses importance of mindfulness and meditation practice to help  reduce stress and improve awareness.  Instructor also leads participants through a meditation exercise.    Stretching for Flexibility and Mobility:  -Group instruction provided by verbal instruction, patient participation, and written materials to support subject.  Instructors lead participants through series of stretches that are designed to increase flexibility thus improving mobility.  These stretches are additional exercise for major muscle groups that are typically performed during regular warm up and cool down.   Hands Only CPR:  -Group verbal, video, and participation provides a basic overview of AHA guidelines for community CPR. Role-play of emergencies allow participants the opportunity to practice calling for help and chest compression technique with discussion of AED use.   Hypertension: -Group verbal and written instruction that provides a basic  overview of hypertension including the most recent diagnostic guidelines, risk factor reduction with self-care instructions and medication management.    Nutrition I class: Heart Healthy Eating:  -Group instruction provided by PowerPoint slides, verbal discussion, and written materials to support subject matter. The instructor gives an explanation and review of the Therapeutic Lifestyle Changes diet recommendations, which includes a discussion on lipid goals, dietary fat, sodium, fiber, plant stanol/sterol esters, sugar, and the components of a well-balanced, healthy diet.   Nutrition II class: Lifestyle Skills:  -Group instruction provided by PowerPoint slides, verbal discussion, and written materials to support subject matter. The instructor gives an explanation and review of label reading, grocery shopping for heart health, heart healthy recipe modifications, and ways to make healthier choices when eating out.   Diabetes Question & Answer:  -Group instruction provided by PowerPoint slides, verbal discussion, and written materials to  support subject matter. The instructor gives an explanation and review of diabetes co-morbidities, pre- and post-prandial blood glucose goals, pre-exercise blood glucose goals, signs, symptoms, and treatment of hypoglycemia and hyperglycemia, and foot care basics.   Diabetes Blitz:  -Group instruction provided by PowerPoint slides, verbal discussion, and written materials to support subject matter. The instructor gives an explanation and review of the physiology behind type 1 and type 2 diabetes, diabetes medications and rational behind using different medications, pre- and post-prandial blood glucose recommendations and Hemoglobin A1c goals, diabetes diet, and exercise including blood glucose guidelines for exercising safely.    Portion Distortion:  -Group instruction provided by PowerPoint slides, verbal discussion, written materials, and food models to support subject matter. The instructor gives an explanation of serving size versus portion size, changes in portions sizes over the last 20 years, and what consists of a serving from each food group.   Stress Management:  -Group instruction provided by verbal instruction, video, and written materials to support subject matter.  Instructors review role of stress in heart disease and how to cope with stress positively.     Exercising on Your Own:  -Group instruction provided by verbal instruction, power point, and written materials to support subject.  Instructors discuss benefits of exercise, components of exercise, frequency and intensity of exercise, and end points for exercise.  Also discuss use of nitroglycerin and activating EMS.  Review options of places to exercise outside of rehab.  Review guidelines for sex with heart disease.   Cardiac Drugs I:  -Group instruction provided by verbal instruction and written materials to support subject.  Instructor reviews cardiac drug classes: antiplatelets, anticoagulants, beta blockers, and statins.   Instructor discusses reasons, side effects, and lifestyle considerations for each drug class.   Cardiac Drugs II:  -Group instruction provided by verbal instruction and written materials to support subject.  Instructor reviews cardiac drug classes: angiotensin converting enzyme inhibitors (ACE-I), angiotensin II receptor blockers (ARBs), nitrates, and calcium channel blockers.  Instructor discusses reasons, side effects, and lifestyle considerations for each drug class.   Anatomy and Physiology of the Circulatory System:  Group verbal and written instruction and models provide basic cardiac anatomy and physiology, with the coronary electrical and arterial systems. Review of: AMI, Angina, Valve disease, Heart Failure, Peripheral Artery Disease, Cardiac Arrhythmia, Pacemakers, and the ICD.   Other Education:  -Group or individual verbal, written, or video instructions that support the educational goals of the cardiac rehab program.   Holiday Eating Survival Tips:  -Group instruction provided by PowerPoint slides, verbal discussion, and written materials to support subject matter. The instructor gives  patients tips, tricks, and techniques to help them not only survive but enjoy the holidays despite the onslaught of food that accompanies the holidays.   Knowledge Questionnaire Score: Knowledge Questionnaire Score - 10/29/17 1510      Knowledge Questionnaire Score   Pre Score  16/24       Core Components/Risk Factors/Patient Goals at Admission: Personal Goals and Risk Factors at Admission - 10/29/17 1452      Core Components/Risk Factors/Patient Goals on Admission    Weight Management  Yes;Weight Loss    Intervention  Weight Management: Develop a combined nutrition and exercise program designed to reach desired caloric intake, while maintaining appropriate intake of nutrient and fiber, sodium and fats, and appropriate energy expenditure required for the weight goal.;Weight Management:  Provide education and appropriate resources to help participant work on and attain dietary goals.;Weight Management/Obesity: Establish reasonable short term and long term weight goals.    Admit Weight  166 lb 7.2 oz (75.5 kg)    Goal Weight: Short Term  163 lb (73.9 kg)    Goal Weight: Long Term  160 lb (72.6 kg)    Expected Outcomes  Short Term: Continue to assess and modify interventions until short term weight is achieved;Long Term: Adherence to nutrition and physical activity/exercise program aimed toward attainment of established weight goal;Weight Maintenance: Understanding of the daily nutrition guidelines, which includes 25-35% calories from fat, 7% or less cal from saturated fats, less than 200mg  cholesterol, less than 1.5gm of sodium, & 5 or more servings of fruits and vegetables daily;Weight Loss: Understanding of general recommendations for a balanced deficit meal plan, which promotes 1-2 lb weight loss per week and includes a negative energy balance of [phone] kcal/d;Understanding recommendations for meals to include 15-35% energy as protein, 25-35% energy from fat, 35-60% energy from carbohydrates, less than 200mg  of dietary cholesterol, 20-35 gm of total fiber daily;Understanding of distribution of calorie intake throughout the day with the consumption of 4-5 meals/snacks    Hypertension  Yes    Intervention  Monitor prescription use compliance.;Provide education on lifestyle modifcations including regular physical activity/exercise, weight management, moderate sodium restriction and increased consumption of fresh fruit, vegetables, and low fat dairy, alcohol moderation, and smoking cessation.    Expected Outcomes  Short Term: Continued assessment and intervention until BP is < 140/37mm HG in hypertensive participants. < 130/38mm HG in hypertensive participants with diabetes, heart failure or chronic kidney disease.;Long Term: Maintenance of blood pressure at goal levels.    Lipids  Yes     Intervention  Provide education and support for participant on nutrition & aerobic/resistive exercise along with prescribed medications to achieve LDL 70mg , HDL >40mg .    Expected Outcomes  Short Term: Participant states understanding of desired cholesterol values and is compliant with medications prescribed. Participant is following exercise prescription and nutrition guidelines.;Long Term: Cholesterol controlled with medications as prescribed, with individualized exercise RX and with personalized nutrition plan. Value goals: LDL < 70mg , HDL > 40 mg.       Core Components/Risk Factors/Patient Goals Review:    Core Components/Risk Factors/Patient Goals at Discharge (Final Review):    ITP Comments: ITP Comments    Row Name 10/29/17 1402           ITP Comments  Dr. Fransico Him, Medical Director          Comments: Collie Siad attended orientation from 1335 to 1502 to review rules and guidelines for program. Completed 6 minute walk test, Intitial ITP, and exercise prescription.  Sue's resting and exertional systolic BP was in the 846'N.Exit blood pressure 110/68. Will continue to monitor BP's. Telemetry-Sinus Rhythm with a rare PAC. Collie Siad did report some sciatic pain in her left hip area and some shortness of breath that resolved with rest.Maria Whitaker, RN,BSN 10/29/2017 4:00 PM

## 2017-11-02 ENCOUNTER — Encounter (HOSPITAL_COMMUNITY): Payer: PPO

## 2017-11-04 ENCOUNTER — Encounter (HOSPITAL_COMMUNITY): Payer: PPO

## 2017-11-04 ENCOUNTER — Encounter (HOSPITAL_COMMUNITY)
Admission: RE | Admit: 2017-11-04 | Discharge: 2017-11-04 | Disposition: A | Discharge status: Home / Self Care | Payer: PPO | Source: Ambulatory Visit | Attending: Cardiology | Admitting: Cardiology

## 2017-11-04 DIAGNOSIS — Z955 Presence of coronary angioplasty implant and graft: Secondary | ICD-10-CM

## 2017-11-04 DIAGNOSIS — I214 Non-ST elevation (NSTEMI) myocardial infarction: Secondary | ICD-10-CM | POA: Insufficient documentation

## 2017-11-04 NOTE — Progress Notes (Signed)
Daily Session Note  Patient Details  Name: Jacqueline Burke MRN: [id number] Date of Birth: 06-26-1935 Referring Provider:     CARDIAC REHAB PHASE II ORIENTATION from 10/29/2017 in [place]  Referring Provider  Sueanne Margarita MD       Encounter Date: 11/04/2017  Check In: Session Check In - 11/04/17 1059      Check-In   Location  [facility]    Staff Present  Jiles Garter, RN BSN;Olinty Celesta Aver, MS, ACSM CEP, Exercise Physiologist;Chiquetta Langner, RN, Mosie Epstein, MS,ACSM CEP, Exercise Physiologist;Amber Fair, MS, ACSM RCEP, Exercise Physiologist    Supervising physician immediately available to respond to emergencies  [facility] immediately available    Physician(s)  Dr. Tana Coast    Medication changes reported      No    Fall or balance concerns reported     No    Tobacco Cessation  No Change    Warm-up and Cool-down  Performed as group-led instruction    Resistance Training Performed  No    VAD Patient?  No      Pain Assessment   Currently in Pain?  No/denies    Multiple Pain Sites  No       Capillary Blood Glucose: No results found for this or any previous visit (from the past 24 hour(s)).    Social History   Tobacco Use  Smoking Status Former Smoker  . Packs/day: 1.00  . Years: 25.00  . Pack years: 25.00  . Types: Cigarettes  . Last attempt to quit: 06/02/1993  . Years since quitting: 24.4  Smokeless Tobacco Never Used    Goals Met:  Exercise tolerated well  Goals Unmet:  Not Applicable  Comments: Pt started cardiac rehab today.  Pt tolerated light exercise without difficulty. VSS, telemetry-Sinus Rhythm, asymptomatic.  Medication list reconciled. Pt denies barriers to medicaiton compliance.  PSYCHOSOCIAL ASSESSMENT:  PHQ-0. Pt exhibits positive coping skills, hopeful outlook with supportive family. No psychosocial needs identified at this time, no psychosocial interventions necessary.    Pt enjoys spending  time with her family and taking care of her boxers.   Pt oriented to exercise equipment and routine.    Understanding verbalized.Barnet Pall, RN,BSN 11/04/2017 12:03 PM  Dr. Fransico Him is Medical Director for Cardiac Rehab at [facility].

## 2017-11-06 ENCOUNTER — Encounter (HOSPITAL_COMMUNITY): Payer: PPO

## 2017-11-06 ENCOUNTER — Encounter (HOSPITAL_COMMUNITY)
Admission: RE | Admit: 2017-11-06 | Discharge: 2017-11-06 | Disposition: A | Discharge status: Home / Self Care | Payer: PPO | Source: Ambulatory Visit | Attending: Cardiology | Admitting: Cardiology

## 2017-11-06 DIAGNOSIS — Z955 Presence of coronary angioplasty implant and graft: Secondary | ICD-10-CM

## 2017-11-06 DIAGNOSIS — I214 Non-ST elevation (NSTEMI) myocardial infarction: Secondary | ICD-10-CM

## 2017-11-09 ENCOUNTER — Encounter (HOSPITAL_COMMUNITY): Payer: PPO

## 2017-11-09 ENCOUNTER — Telehealth: Payer: Self-pay | Admitting: Cardiology

## 2017-11-09 ENCOUNTER — Encounter (HOSPITAL_COMMUNITY)
Admission: RE | Admit: 2017-11-09 | Discharge: 2017-11-09 | Disposition: A | Discharge status: Home / Self Care | Payer: PPO | Source: Ambulatory Visit | Attending: Cardiology | Admitting: Cardiology

## 2017-11-09 DIAGNOSIS — R079 Chest pain, unspecified: Secondary | ICD-10-CM

## 2017-11-09 DIAGNOSIS — Z955 Presence of coronary angioplasty implant and graft: Secondary | ICD-10-CM

## 2017-11-09 DIAGNOSIS — I214 Non-ST elevation (NSTEMI) myocardial infarction: Secondary | ICD-10-CM

## 2017-11-09 MED ORDER — AMLODIPINE BESYLATE 5 MG PO TABS: 7.5000 mg | ORAL_TABLET | Freq: Every day | ORAL | 3 refills | Status: DC

## 2017-11-09 NOTE — Telephone Encounter (Signed)
Pt states she is having some sob that has been ongoing since her MI om 06/09/17. She also states she has been having sporadic episodes chest discomfort since the MI. Sometimes its at the left breast area and sometimes in the middle of the chest. pt states the last episodes was 2 days ago, and only last for a few seconds. Today she was at cardiac rehab, pt SBP elevated at 180 with mild exercise. Cardiac rehab nurse instructed pt to stop and follow up with cardiologist. Pt is scheduled with Ellen Henri, PA on 11/25/17(first available). She is in agreement with plan and thankful for the call

## 2017-11-09 NOTE — Progress Notes (Signed)
Reviewed home exercise guidelines with patient including endpoints, temperature precautions, target heart rate and rate of perceived exertion. Pt plans to walk as her mode of home exercise. Pt voices understanding of instructions given. Undray Allman M Masahiro Iglesia, MS, ACSM CEP  

## 2017-11-09 NOTE — Telephone Encounter (Signed)
Increase amlodipine to 7.5mg  daily and check BP daily for a week and call with results.  Please set up for lexiscan myoview

## 2017-11-09 NOTE — Telephone Encounter (Signed)
New message    Pt c/o Shortness Of Breath: STAT if SOB developed within the last 24 hours or pt is noticeably SOB on the phone  1. Are you currently SOB (can you hear that pt is SOB on the phone)? NO  2. How long have you been experiencing SOB? Off and on since January  3. Are you SOB when sitting or when up moving around? Moving around  4. Are you currently experiencing any other symptoms? SOB and cardiac rehab. Patient also states she is unsure why she is taking 3 high dose medications

## 2017-11-09 NOTE — Telephone Encounter (Signed)
I informed pt that Dr. Radford Pax recommends increasing amlodipine to 7.5 mg daily, check blood pressure daily and call next Monday with blood pressure readings. I also informed pt that Dr. Radford Pax recommends getting a stress test due to chest discomfort. She is in agreement with plan, pt aware our schedulers will call to schedule appt. Instructions reviewed with pt. She states understanding and thankful for the call.

## 2017-11-11 ENCOUNTER — Encounter (HOSPITAL_COMMUNITY)
Admission: RE | Admit: 2017-11-11 | Discharge: 2017-11-11 | Disposition: A | Discharge status: Home / Self Care | Payer: PPO | Source: Ambulatory Visit | Attending: Cardiology | Admitting: Cardiology

## 2017-11-11 ENCOUNTER — Encounter (HOSPITAL_COMMUNITY): Payer: PPO

## 2017-11-11 DIAGNOSIS — Z955 Presence of coronary angioplasty implant and graft: Secondary | ICD-10-CM

## 2017-11-11 DIAGNOSIS — I214 Non-ST elevation (NSTEMI) myocardial infarction: Secondary | ICD-10-CM

## 2017-11-12 ENCOUNTER — Telehealth (HOSPITAL_COMMUNITY): Payer: Self-pay | Admitting: *Deleted

## 2017-11-12 DIAGNOSIS — M545 Low back pain: Secondary | ICD-10-CM | POA: Diagnosis not present

## 2017-11-12 DIAGNOSIS — M154 Erosive (osteo)arthritis: Secondary | ICD-10-CM | POA: Diagnosis not present

## 2017-11-12 DIAGNOSIS — M150 Primary generalized (osteo)arthritis: Secondary | ICD-10-CM | POA: Diagnosis not present

## 2017-11-12 DIAGNOSIS — Z6830 Body mass index (BMI) 30.0-30.9, adult: Secondary | ICD-10-CM | POA: Diagnosis not present

## 2017-11-12 DIAGNOSIS — E669 Obesity, unspecified: Secondary | ICD-10-CM | POA: Diagnosis not present

## 2017-11-12 NOTE — Telephone Encounter (Signed)
Patient given detailed instructions per Myocardial Perfusion Study Information Sheet for the test on 11/17/17. Patient notified to arrive 15 minutes early and that it is imperative to arrive on time for appointment to keep from having the test rescheduled.  If you need to cancel or reschedule your appointment, please call the office within 24 hours of your appointment. . Patient verbalized understanding. [name]

## 2017-11-12 NOTE — Progress Notes (Signed)
Cardiac Individual Treatment Plan  Patient Details  Name: Jacqueline Burke MRN: [id number] Date of Birth: [birth year] Referring Provider:     [place] [facility] from 10/29/2017 in [place]  Referring Provider  Fransico Him R MD       Initial Encounter Date:    CARDIAC REHAB PHASE II ORIENTATION from 10/29/2017 in [place]  Date  10/29/17  Referring Provider  Fransico Him R MD       Visit Diagnosis: Status post coronary artery stent placement 06/15/17  NSTEMI (non-ST elevated myocardial infarction) ([place]) 06/11/17  Patient's Home Medications on Admission:  Current Outpatient Medications:  .  acetaminophen (TYLENOL) 500 MG tablet, Take 1,000 mg by mouth every 8 (eight) hours as needed for moderate pain or headache., Disp: , Rfl:  .  amitriptyline (ELAVIL) 10 MG tablet, Take 20 mg by mouth at bedtime. , Disp: , Rfl:  .  amLODipine (NORVASC) 5 MG tablet, Take 1.5 tablets (7.5 mg total) by mouth daily., Disp: 120 tablet, Rfl: 3 .  Ascorbic Acid (VITAMIN C PO), Take 1,000 mg by mouth daily. , Disp: , Rfl:  .  aspirin EC 81 MG tablet, Take 81 mg by mouth daily. On hold for procedure, Disp: , Rfl:  .  atorvastatin (LIPITOR) 80 MG tablet, Take 1 tablet (80 mg total) by mouth daily at 6 PM., Disp: 30 tablet, Rfl: 0 .  cetirizine (ZYRTEC) 10 MG chewable tablet, Chew 10 mg by mouth daily., Disp: , Rfl:  .  Cholecalciferol (VITAMIN D PO), Take 3,000 Units by mouth daily. , Disp: , Rfl:  .  docusate sodium (COLACE) 100 MG capsule, Take 200 mg by mouth daily. , Disp: , Rfl:  .  FIBER PO, Take 2 tablets by mouth daily., Disp: , Rfl:  .  fluticasone (FLONASE) 50 MCG/ACT nasal spray, Place 2 sprays into both nostrils daily., Disp: 16 g, Rfl: 2 .  gabapentin (NEURONTIN) 100 MG capsule, Take 100 mg by mouth 2 (two) times daily. , Disp: , Rfl:  .  HYDROcodone-acetaminophen (NORCO) 5-325 MG tablet, Take 1 tablet by mouth every  4 (four) hours as needed for moderate pain. (Patient not taking: Reported on 10/21/2017), Disp: 10 tablet, Rfl: 0 .  metoprolol tartrate (LOPRESSOR) 25 MG tablet, Take 1 tablet (25 mg total) by mouth 2 (two) times daily., Disp: 180 tablet, Rfl: 3 .  oxybutynin (DITROPAN) 5 MG tablet, Take 5 mg by mouth 2 (two) times daily., Disp: , Rfl:  .  tamsulosin (FLOMAX) 0.4 MG CAPS capsule, Take 1 capsule (0.4 mg total) by mouth daily., Disp: 10 capsule, Rfl: 3 .  ticagrelor (BRILINTA) 90 MG TABS tablet, Take 1 tablet (90 mg total) by mouth 2 (two) times daily., Disp: 60 tablet, Rfl: 10 .  trospium (SANCTURA) 20 MG tablet, Take 60 mg by mouth daily., Disp: , Rfl:   Past Medical History: Past Medical History:  Diagnosis Date  . Allergic rhinitis   . CAD (coronary artery disease), native coronary artery    NSTEMI with 95% RCA s/p DES to RCA 06/2017  . Diverticulosis   . GERD (gastroesophageal reflux disease)   . Heart murmur    Systolic heart murmur with Aortic valve sclerosis by ECHO  . History of kidney stones   . Hyperlipidemia   . Hypertension   . Osteoarthritis    Erosive OA-MRI of R hand negative for synovitis, only OA-Dr Trudie Reed  . Osteopenia   .  Pelvic prolapse   . PVC's (premature ventricular contractions)     Tobacco Use: Social History   Tobacco Use  Smoking Status Former Smoker  . Packs/day: 1.00  . Years: 25.00  . Pack years: 25.00  . Types: Cigarettes  . Last attempt to quit: 06/02/1993  . Years since quitting: 24.4  Smokeless Tobacco Never Used    Labs: Recent Chemical engineer    Labs for ITP Cardiac and Pulmonary Rehab Latest Ref Rng & Units 06/09/2017 06/16/2017   Cholestrol 0 - 200 mg/dL - 79   LDLCALC 0 - 99 mg/dL - 33   HDL >40 mg/dL - 23(L)   Trlycerides <150 mg/dL - 116   PHART 7.350 - 7.450 7.366 -   PCO2ART 32.0 - 48.0 mmHg 39.0 -   HCO3 20.0 - 28.0 mmol/L 21.9 -   TCO2 22 - 32 mmol/L 23 -   ACIDBASEDEF 0.0 - 2.0 mmol/L 3.0(H) -   O2SAT % 100.0 -       Capillary Blood Glucose: No results found for: GLUCAP   Exercise Target Goals:    Exercise Program Goal: Individual exercise prescription set using results from initial 6 min walk test and THRR while considering  patient's activity barriers and safety.   Exercise Prescription Goal: Initial exercise prescription builds to 30-45 minutes a day of aerobic activity, 2-3 days per week.  Home exercise guidelines will be given to patient during program as part of exercise prescription that the participant will acknowledge.  Activity Barriers & Risk Stratification: Activity Barriers & Cardiac Risk Stratification - 10/29/17 1506      Activity Barriers & Cardiac Risk Stratification   Activity Barriers  Back Problems;Arthritis;Left Knee Replacement;Right Knee Replacement    Cardiac Risk Stratification  High       6 Minute Walk: 6 Minute Walk    Row Name 10/29/17 1454         6 Minute Walk   Phase  Initial     Distance  1239 feet     Walk Time  6 minutes     # of Rest Breaks  0     MPH  2.35     METS  1.99     RPE  13     Perceived Dyspnea   2     VO2 Peak  6.96     Symptoms  Yes (comment)     Comments  Sciatic Nerve Pain 6/10, SOB +2     Resting HR  61 bpm     Resting BP  142/82     Resting Oxygen Saturation   96 %     Exercise Oxygen Saturation  during 6 min walk  97 %     Max Ex. HR  88 bpm     Max Ex. BP  148/60     2 Minute Post BP  110/68        Oxygen Initial Assessment:   Oxygen Re-Evaluation:   Oxygen Discharge (Final Oxygen Re-Evaluation):   Initial Exercise Prescription: Initial Exercise Prescription - 10/29/17 1500      Date of Initial Exercise RX and Referring Provider   Date  10/29/17    Referring Provider  Sueanne Margarita MD       Recumbant Bike   Level  1.5    Watts  5    Minutes  10    METs  2      NuStep   Level  1    SPM  75    Minutes  10    METs  1.5      Track   Laps  8    Minutes  10    METs  2.38      Prescription  Details   Frequency (times per week)  3x    Duration  Progress to 30 minutes of continuous aerobic without signs/symptoms of physical distress      Intensity   THRR 40-80% of Max Heartrate  56-113    Ratings of Perceived Exertion  11-13    Perceived Dyspnea  0-4      Progression   Progression  Continue progressive overload as per policy without signs/symptoms or physical distress.      Resistance Training   Training Prescription  Yes    Weight  2lbs    Reps  10-15       Perform Capillary Blood Glucose checks as needed.  Exercise Prescription Changes: Exercise Prescription Changes    Row Name 11/04/17 [phone]             Response to Exercise   Blood Pressure (Admit)  138/74       Blood Pressure (Exercise)  158/72       Blood Pressure (Exit)  112/72       Heart Rate (Admit)  68 bpm       Heart Rate (Exercise)  93 bpm       Heart Rate (Exit)  53 bpm       Rating of Perceived Exertion (Exercise)  13       Symptoms  none       Duration  Progress to 30 minutes of  aerobic without signs/symptoms of physical distress       Intensity  THRR unchanged         Progression   Progression  Continue to progress workloads to maintain intensity without signs/symptoms of physical distress.       Average METs  2         Resistance Training   Training Prescription  No Relaxation day, no weights.         Interval Training   Interval Training  No         Recumbant Bike   Level  -       Watts  -       Minutes  10       METs  -         NuStep   Level  1       SPM  75       Minutes  10       METs  1.7         Arm Ergometer   Level  1       Minutes  10         Track   Laps  8       Minutes  10       METs  2.39          Exercise Comments: Exercise Comments    Row Name 11/04/17 1010 11/09/17 1027         Exercise Comments  Patient is off to a good start with exercise. Reviewed RPE scale with patient.  Reviewed home exercise guidelines, METs and goals with patient.          Exercise Goals and Review:   Exercise Goals Re-Evaluation : Exercise Goals Re-Evaluation    Row Name 11/04/17  1000 11/09/17 1027           Exercise Goal Re-Evaluation   Exercise Goals Review  Able to understand and use rate of perceived exertion (RPE) scale;Increase Physical Activity;Increase Strength and Stamina  Able to understand and use rate of perceived exertion (RPE) scale;Knowledge and understanding of Target Heart Rate Range (THRR);Understanding of Exercise Prescription      Comments  Patient wants to increase stamina and decrease SOB when doing activities. Reviewed RPE scale, and pt understands and is able to use the RPE scale appropriately.  Reviewed home exercise guidelines with patient including THRR, RPE scale, and endpoints for exercise.      Expected Outcomes  Increase workloads to help achieve goal of increased stamina and decreased SOB with activity.  Pt will add 1-2 days walking in addition to exercise at [facility].          Discharge Exercise Prescription (Final Exercise Prescription Changes): Exercise Prescription Changes - 11/04/17 0953      Response to Exercise   Blood Pressure (Admit)  138/74    Blood Pressure (Exercise)  158/72    Blood Pressure (Exit)  112/72    Heart Rate (Admit)  68 bpm    Heart Rate (Exercise)  93 bpm    Heart Rate (Exit)  53 bpm    Rating of Perceived Exertion (Exercise)  13    Symptoms  none    Duration  Progress to 30 minutes of  aerobic without signs/symptoms of physical distress    Intensity  THRR unchanged      Progression   Progression  Continue to progress workloads to maintain intensity without signs/symptoms of physical distress.    Average METs  2      Resistance Training   Training Prescription  No Relaxation day, no weights.      Interval Training   Interval Training  No      Recumbant Bike   Level  --    Watts  --    Minutes  10    METs  --      NuStep   Level  1    SPM  75    Minutes  10    METs  1.7       Arm Ergometer   Level  1    Minutes  10      Track   Laps  8    Minutes  10    METs  2.39       Nutrition:  Target Goals: Understanding of nutrition guidelines, daily intake of sodium 1500mg , cholesterol 200mg , calories 30% from fat and 7% or less from saturated fats, daily to have 5 or more servings of fruits and vegetables.  Biometrics: Pre Biometrics - 10/29/17 1506      Pre Biometrics   Height  5\' 1"  (1.549 m)    Weight  166 lb 7.2 oz (75.5 kg)    Waist Circumference  37 inches    Hip Circumference  43 inches    Waist to Hip Ratio  0.86 %    BMI (Calculated)  31.47    Triceps Skinfold  34 mm    % Body Fat  44.1 %    Grip Strength  24 kg    Flexibility  12 in    Single Leg Stand  15.5 seconds        Nutrition Therapy Plan and Nutrition Goals: Nutrition Therapy & Goals - 11/02/17 1031      Nutrition  Therapy   Diet  Heart Healthy      Personal Nutrition Goals   Nutrition Goal  Pt to identify food quantities necessary to achieve weight loss of 6-24 lb at graduation from cardiac rehab.       Intervention Plan   Intervention  Prescribe, educate and counsel regarding individualized specific dietary modifications aiming towards targeted core components such as weight, hypertension, lipid management, diabetes, heart failure and other comorbidities.    Expected Outcomes  Short Term Goal: Understand basic principles of dietary content, such as calories, fat, sodium, cholesterol and nutrients.;Long Term Goal: Adherence to prescribed nutrition plan.       Nutrition Assessments: Nutrition Assessments - 11/02/17 1522      MEDFICTS Scores   Pre Score  26       Nutrition Goals Re-Evaluation:   Nutrition Goals Re-Evaluation:   Nutrition Goals Discharge (Final Nutrition Goals Re-Evaluation):   Psychosocial: Target Goals: Acknowledge presence or absence of significant depression and/or stress, maximize coping skills, provide positive support system.  Participant is able to verbalize types and ability to use techniques and skills needed for reducing stress and depression.  Initial Review & Psychosocial Screening: Initial Psych Review & Screening - 10/29/17 1544      Initial Review   Current issues with  None Identified      Family Dynamics   Good Support System?  Yes Jacqueline Burke has her husband and children for support      Barriers   Psychosocial barriers to participate in program  There are no identifiable barriers or psychosocial needs.      Screening Interventions   Interventions  Encouraged to exercise       Quality of Life Scores: Quality of Life - 10/29/17 1512      Quality of Life Scores   Health/Function Pre  30 %    Socioeconomic Pre  30 %    Psych/Spiritual Pre  30 %    Family Pre  30 %    GLOBAL Pre  30 %      Scores of 19 and below usually indicate a poorer quality of life in these areas.  A difference of  2-3 points is a clinically meaningful difference.  A difference of 2-3 points in the total score of the Quality of Life Index has been associated with significant improvement in overall quality of life, self-image, physical symptoms, and general health in studies assessing change in quality of life.  PHQ-9: Recent Review Flowsheet Data    Depression screen [facility] 2/9 11/04/2017   Decreased Interest 0   Down, Depressed, Hopeless 0   PHQ - 2 Score 0     Interpretation of Total Score  Total Score Depression Severity:  1-4 = Minimal depression, 5-9 = Mild depression, 10-14 = Moderate depression, 15-19 = Moderately severe depression, 20-27 = Severe depression   Psychosocial Evaluation and Intervention:   Psychosocial Re-Evaluation: Psychosocial Re-Evaluation    [place] Name 11/12/17 1118             Psychosocial Re-Evaluation   Current issues with  None Identified       Interventions  Encouraged to attend Cardiac Rehabilitation for the exercise       Continue Psychosocial Services   No Follow up required           Psychosocial Discharge (Final Psychosocial Re-Evaluation): Psychosocial Re-Evaluation - 11/12/17 1118      Psychosocial Re-Evaluation   Current issues with  None Identified    Interventions  Encouraged to attend Cardiac Rehabilitation for the exercise    Continue Psychosocial Services   No Follow up required       Vocational Rehabilitation: Provide vocational rehab assistance to qualifying candidates.   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 Participant will state understanding/return demonstration of topics presented.  Learning Barriers/Preferences: Learning Barriers/Preferences - 10/29/17 1508      Learning Barriers/Preferences   Learning Barriers  Sight    Learning Preferences  Skilled Demonstration;Individual Instruction;Verbal Instruction;Written Material;Video       Education Topics: Count Your Pulse:  -Group instruction provided by verbal instruction, demonstration, patient participation and written materials to support subject.  Instructors address importance of being able to find your pulse and how to count your pulse when at home without a heart monitor.  Patients get hands on experience counting their pulse with staff help and individually.   Heart Attack, Angina, and Risk Factor Modification:  -Group instruction provided by verbal instruction, video, and written materials to support subject.  Instructors address signs and symptoms of angina and heart attacks.    Also discuss risk factors for heart disease and how to make changes to improve heart health risk factors.   Functional Fitness:  -Group instruction  provided by verbal instruction, demonstration, patient participation, and written materials to support subject.  Instructors address safety measures for doing things around the house.  Discuss how to get up and down off the floor, how to pick things up properly, how to safely get out of a chair without assistance, and balance training.   Meditation and Mindfulness:  -Group instruction provided by verbal instruction, patient participation, and written materials to support subject.  Instructor addresses importance of mindfulness and meditation practice to help reduce stress and improve awareness.  Instructor also leads participants through a meditation exercise.    Stretching for Flexibility and Mobility:  -Group instruction provided by verbal instruction, patient participation, and written materials to support subject.  Instructors lead participants through series of stretches that are designed to increase flexibility thus improving mobility.  These stretches are additional exercise for major muscle groups that are typically performed during regular warm up and cool down.   Hands Only CPR:  -Group verbal, video, and participation provides a basic overview of AHA guidelines for community CPR. Role-play of emergencies allow participants the opportunity to practice calling for help and chest compression technique with discussion of AED use.   Hypertension: -Group verbal and written instruction that provides a basic overview of hypertension including the most recent diagnostic guidelines, risk factor reduction with self-care instructions and medication management.    Nutrition I class: Heart Healthy Eating:  -Group instruction provided by PowerPoint slides, verbal discussion, and written materials to support subject matter. The instructor gives an explanation and review of the Therapeutic Lifestyle Changes diet recommendations, which includes a discussion on lipid goals, dietary fat, sodium, fiber,  plant stanol/sterol esters, sugar, and the components of a well-balanced, healthy diet.   CARDIAC REHAB PHASE II EXERCISE from 11/11/2017 in [place]  Date  10/29/17  Educator  RD      Nutrition II class:  Lifestyle Skills:  -Group instruction provided by PowerPoint slides, verbal discussion, and written materials to support subject matter. The instructor gives an explanation and review of label reading, grocery shopping for heart health, heart healthy recipe modifications, and ways to make healthier choices when eating out.   CARDIAC REHAB PHASE II EXERCISE from 11/11/2017 in [place]  Date  10/29/17  Educator  RD      Diabetes Question & Answer:  -Group instruction provided by PowerPoint slides, verbal discussion, and written materials to support subject matter. The instructor gives an explanation and review of diabetes co-morbidities, pre- and post-prandial blood glucose goals, pre-exercise blood glucose goals, signs, symptoms, and treatment of hypoglycemia and hyperglycemia, and foot care basics.   Diabetes Blitz:  -Group instruction provided by PowerPoint slides, verbal discussion, and written materials to support subject matter. The instructor gives an explanation and review of the physiology behind type 1 and type 2 diabetes, diabetes medications and rational behind using different medications, pre- and post-prandial blood glucose recommendations and Hemoglobin A1c goals, diabetes diet, and exercise including blood glucose guidelines for exercising safely.    Portion Distortion:  -Group instruction provided by PowerPoint slides, verbal discussion, written materials, and food models to support subject matter. The instructor gives an explanation of serving size versus portion size, changes in portions sizes over the last 20 years, and what consists of a serving from each food group.   Stress Management:  -Group  instruction provided by verbal instruction, video, and written materials to support subject matter.  Instructors review role of stress in heart disease and how to cope with stress positively.     Exercising on Your Own:  -Group instruction provided by verbal instruction, power point, and written materials to support subject.  Instructors discuss benefits of exercise, components of exercise, frequency and intensity of exercise, and end points for exercise.  Also discuss use of nitroglycerin and activating EMS.  Review options of places to exercise outside of rehab.  Review guidelines for sex with heart disease.   Cardiac Drugs I:  -Group instruction provided by verbal instruction and written materials to support subject.  Instructor reviews cardiac drug classes: antiplatelets, anticoagulants, beta blockers, and statins.  Instructor discusses reasons, side effects, and lifestyle considerations for each drug class.   Cardiac Drugs II:  -Group instruction provided by verbal instruction and written materials to support subject.  Instructor reviews cardiac drug classes: angiotensin converting enzyme inhibitors (ACE-I), angiotensin II receptor blockers (ARBs), nitrates, and calcium channel blockers.  Instructor discusses reasons, side effects, and lifestyle considerations for each drug class.   CARDIAC REHAB PHASE II EXERCISE from 11/11/2017 in [place]  Date  11/11/17  Instruction Review Code  2- Demonstrated Understanding      Anatomy and Physiology of the Circulatory System:  Group verbal and written instruction and models provide basic cardiac anatomy and physiology, with the coronary electrical and arterial systems. Review of: AMI, Angina, Valve disease, Heart Failure, Peripheral Artery Disease, Cardiac Arrhythmia, Pacemakers, and the ICD.   Other Education:  -Group or individual verbal, written, or video instructions that support the educational goals of the  cardiac rehab program.   Holiday Eating Survival Tips:  -Group instruction provided by PowerPoint slides, verbal discussion, and written materials to support subject matter. The instructor gives patients tips, tricks, and techniques to help them not only survive but enjoy the holidays despite the onslaught of food that accompanies the holidays.   Knowledge  Questionnaire Score: Knowledge Questionnaire Score - 10/29/17 1510      Knowledge Questionnaire Score   Pre Score  16/24       Core Components/Risk Factors/Patient Goals at Admission: Personal Goals and Risk Factors at Admission - 10/29/17 1452      Core Components/Risk Factors/Patient Goals on Admission    Weight Management  Yes;Weight Loss    Intervention  Weight Management: Develop a combined nutrition and exercise program designed to reach desired caloric intake, while maintaining appropriate intake of nutrient and fiber, sodium and fats, and appropriate energy expenditure required for the weight goal.;Weight Management: Provide education and appropriate resources to help participant work on and attain dietary goals.;Weight Management/Obesity: Establish reasonable short term and long term weight goals.    Admit Weight  166 lb 7.2 oz (75.5 kg)    Goal Weight: Short Term  163 lb (73.9 kg)    Goal Weight: Long Term  160 lb (72.6 kg)    Expected Outcomes  Short Term: Continue to assess and modify interventions until short term weight is achieved;Long Term: Adherence to nutrition and physical activity/exercise program aimed toward attainment of established weight goal;Weight Maintenance: Understanding of the daily nutrition guidelines, which includes 25-35% calories from fat, 7% or less cal from saturated fats, less than 200mg  cholesterol, less than 1.5gm of sodium, & 5 or more servings of fruits and vegetables daily;Weight Loss: Understanding of general recommendations for a balanced deficit meal plan, which promotes 1-2 lb weight loss per  week and includes a negative energy balance of [phone] kcal/d;Understanding recommendations for meals to include 15-35% energy as protein, 25-35% energy from fat, 35-60% energy from carbohydrates, less than 200mg  of dietary cholesterol, 20-35 gm of total fiber daily;Understanding of distribution of calorie intake throughout the day with the consumption of 4-5 meals/snacks    Hypertension  Yes    Intervention  Monitor prescription use compliance.;Provide education on lifestyle modifcations including regular physical activity/exercise, weight management, moderate sodium restriction and increased consumption of fresh fruit, vegetables, and low fat dairy, alcohol moderation, and smoking cessation.    Expected Outcomes  Short Term: Continued assessment and intervention until BP is < 140/48mm HG in hypertensive participants. < 130/59mm HG in hypertensive participants with diabetes, heart failure or chronic kidney disease.;Long Term: Maintenance of blood pressure at goal levels.    Lipids  Yes    Intervention  Provide education and support for participant on nutrition & aerobic/resistive exercise along with prescribed medications to achieve LDL 70mg , HDL >40mg .    Expected Outcomes  Short Term: Participant states understanding of desired cholesterol values and is compliant with medications prescribed. Participant is following exercise prescription and nutrition guidelines.;Long Term: Cholesterol controlled with medications as prescribed, with individualized exercise RX and with personalized nutrition plan. Value goals: LDL < 70mg , HDL > 40 mg.       Core Components/Risk Factors/Patient Goals Review:  Goals and Risk Factor Review    Row Name 11/12/17 1115             Core Components/Risk Factors/Patient Goals Review   Personal Goals Review  Weight Management/Obesity;Lipids       Review  Jacqueline Burke is off to a good start to exercise will continue to monitor blood pressures as Jacqueline Burke has had some moderate  variations.       Expected Outcomes  Jacqueline Burke will continue to take her medications as presribed and participate in phase 2 cardiac rehab.          Core Components/Risk Factors/Patient  Goals at Discharge (Final Review):  Goals and Risk Factor Review - 11/12/17 1115      Core Components/Risk Factors/Patient Goals Review   Personal Goals Review  Weight Management/Obesity;Lipids    Review  Jacqueline Burke is off to a good start to exercise will continue to monitor blood pressures as Jacqueline Burke has had some moderate variations.    Expected Outcomes  Jacqueline Burke will continue to take her medications as presribed and participate in phase 2 cardiac rehab.       ITP Comments: ITP Comments    Row Name 10/29/17 1402 11/12/17 1112         ITP Comments  Dr. Fransico Him, Medical Director  30 Day ITP review. Patient is off to a good start to exercise despite dealing with chronic sciatica and hip pain         Comments: See ITP comments. Jacqueline Burke is off to a good start to exercise and takes rest breaks as needed for chronic sciatica and hip pain when walking the track.Barnet Pall, RN,BSN 11/12/2017 11:21 AM

## 2017-11-13 ENCOUNTER — Encounter (HOSPITAL_COMMUNITY): Payer: PPO

## 2017-11-13 ENCOUNTER — Encounter (HOSPITAL_COMMUNITY)
Admission: RE | Admit: 2017-11-13 | Discharge: 2017-11-13 | Disposition: A | Discharge status: Home / Self Care | Payer: PPO | Source: Ambulatory Visit | Attending: Cardiology | Admitting: Cardiology

## 2017-11-13 DIAGNOSIS — Z955 Presence of coronary angioplasty implant and graft: Secondary | ICD-10-CM

## 2017-11-13 DIAGNOSIS — I214 Non-ST elevation (NSTEMI) myocardial infarction: Secondary | ICD-10-CM

## 2017-11-16 ENCOUNTER — Encounter (HOSPITAL_COMMUNITY)
Admission: RE | Admit: 2017-11-16 | Discharge: 2017-11-16 | Disposition: A | Discharge status: Home / Self Care | Payer: PPO | Source: Ambulatory Visit | Attending: Cardiology | Admitting: Cardiology

## 2017-11-16 ENCOUNTER — Encounter (HOSPITAL_COMMUNITY): Payer: PPO

## 2017-11-16 DIAGNOSIS — Z955 Presence of coronary angioplasty implant and graft: Secondary | ICD-10-CM | POA: Diagnosis not present

## 2017-11-16 DIAGNOSIS — I214 Non-ST elevation (NSTEMI) myocardial infarction: Secondary | ICD-10-CM

## 2017-11-17 ENCOUNTER — Encounter (HOSPITAL_COMMUNITY): Payer: Self-pay | Admitting: *Deleted

## 2017-11-17 ENCOUNTER — Ambulatory Visit (HOSPITAL_COMMUNITY): Payer: PPO | Attending: Cardiovascular Disease

## 2017-11-17 DIAGNOSIS — R079 Chest pain, unspecified: Secondary | ICD-10-CM | POA: Diagnosis not present

## 2017-11-17 MED ORDER — TECHNETIUM TC 99M TETROFOSMIN IV KIT
30.5000 | PACK | Freq: Once | INTRAVENOUS | Status: AC | PRN
  Administered 2017-11-17: 30.5 via INTRAVENOUS
  Filled 2017-11-17: qty 31

## 2017-11-18 ENCOUNTER — Encounter (HOSPITAL_COMMUNITY): Payer: PPO

## 2017-11-18 ENCOUNTER — Encounter (HOSPITAL_COMMUNITY)
Admission: RE | Admit: 2017-11-18 | Discharge: 2017-11-18 | Disposition: A | Discharge status: Home / Self Care | Payer: PPO | Source: Ambulatory Visit | Attending: Cardiology | Admitting: Cardiology

## 2017-11-18 DIAGNOSIS — Z955 Presence of coronary angioplasty implant and graft: Secondary | ICD-10-CM

## 2017-11-18 DIAGNOSIS — I214 Non-ST elevation (NSTEMI) myocardial infarction: Secondary | ICD-10-CM

## 2017-11-18 NOTE — Progress Notes (Signed)
Jacqueline Burke 82 y.o. female Nutrition Note Spoke with pt. Nutrition Plan and Nutrition Survey goals reviewed with pt. Pt is following a Heart Healthy diet. Pt wants to lose wt. Pt has been trying to lose wt by following heart healthy diet, and by moderating portion sizes. Discussed heart healthy shopping tips and distributed handout to patient today. Discussed reducing baked goods consumed and replacing them with complex carbohydrate options instead. Pt expressed understanding of the information reviewed. Pt aware of nutrition education classes offered.   Wt Readings from Last 3 Encounters:  11/17/17 166 lb (75.3 kg)  10/29/17 166 lb 7.2 oz (75.5 kg)  08/25/17 166 lb (75.3 kg)     Nutrition Diagnosis ? Food-and nutrition-related knowledge deficit related to lack of exposure to information as related to diagnosis of: ? CVD  ? Obesity related to excessive energy intake as evidenced by a BMI of 31.37  Nutrition Intervention ? Pt's individual nutrition plan reviewed with pt.  Goal(s)  ? Pt to identify food quantities necessary to achieve weight loss of 6-24 lb at graduation from cardiac rehab.   Laurina Bustle, MS, RD, LDN 11/18/2017 11:01 AM

## 2017-11-20 ENCOUNTER — Encounter (HOSPITAL_COMMUNITY): Payer: PPO

## 2017-11-20 ENCOUNTER — Encounter (HOSPITAL_COMMUNITY)
Admission: RE | Admit: 2017-11-20 | Discharge: 2017-11-20 | Disposition: A | Discharge status: Home / Self Care | Payer: PPO | Source: Ambulatory Visit | Attending: Cardiology | Admitting: Cardiology

## 2017-11-20 DIAGNOSIS — Z955 Presence of coronary angioplasty implant and graft: Secondary | ICD-10-CM | POA: Diagnosis not present

## 2017-11-20 DIAGNOSIS — I214 Non-ST elevation (NSTEMI) myocardial infarction: Secondary | ICD-10-CM

## 2017-11-23 ENCOUNTER — Encounter (HOSPITAL_COMMUNITY): Payer: PPO

## 2017-11-23 ENCOUNTER — Ambulatory Visit (HOSPITAL_COMMUNITY): Payer: PPO | Attending: Internal Medicine

## 2017-11-23 ENCOUNTER — Encounter (HOSPITAL_COMMUNITY)
Admission: RE | Admit: 2017-11-23 | Discharge: 2017-11-23 | Disposition: A | Discharge status: Home / Self Care | Payer: PPO | Source: Ambulatory Visit | Attending: Cardiology | Admitting: Cardiology

## 2017-11-23 DIAGNOSIS — R079 Chest pain, unspecified: Secondary | ICD-10-CM | POA: Insufficient documentation

## 2017-11-23 DIAGNOSIS — Z955 Presence of coronary angioplasty implant and graft: Secondary | ICD-10-CM | POA: Diagnosis not present

## 2017-11-23 DIAGNOSIS — I214 Non-ST elevation (NSTEMI) myocardial infarction: Secondary | ICD-10-CM

## 2017-11-23 LAB — MYOCARDIAL PERFUSION IMAGING
CHL CUP NUCLEAR SDS: 1
CHL CUP NUCLEAR SRS: 10
CHL CUP NUCLEAR SSS: 7
LHR: 0.36
LV dias vol: 79 mL (ref 46–106)
LVSYSVOL: 24 mL
Peak HR: 76 {beats}/min
Rest HR: 166 {beats}/min
TID: 1.15

## 2017-11-23 MED ORDER — REGADENOSON 0.4 MG/5ML IV SOLN
0.4000 mg | Freq: Once | INTRAVENOUS | Status: AC
  Administered 2017-11-23: 0.4 mg via INTRAVENOUS

## 2017-11-23 MED ORDER — TECHNETIUM TC 99M TETROFOSMIN IV KIT
31.0000 | PACK | Freq: Once | INTRAVENOUS | Status: AC | PRN
  Administered 2017-11-23: 31 via INTRAVENOUS
  Filled 2017-11-23: qty 31

## 2017-11-24 DIAGNOSIS — N183 Chronic kidney disease, stage 3 (moderate): Secondary | ICD-10-CM | POA: Diagnosis not present

## 2017-11-25 ENCOUNTER — Encounter (HOSPITAL_COMMUNITY): Payer: PPO

## 2017-11-25 ENCOUNTER — Encounter (HOSPITAL_COMMUNITY)
Admission: RE | Admit: 2017-11-25 | Discharge: 2017-11-25 | Disposition: A | Discharge status: Home / Self Care | Payer: PPO | Source: Ambulatory Visit | Attending: Cardiology | Admitting: Cardiology

## 2017-11-25 DIAGNOSIS — I214 Non-ST elevation (NSTEMI) myocardial infarction: Secondary | ICD-10-CM

## 2017-11-25 DIAGNOSIS — Z955 Presence of coronary angioplasty implant and graft: Secondary | ICD-10-CM | POA: Diagnosis not present

## 2017-11-26 ENCOUNTER — Encounter: Payer: Self-pay | Admitting: Cardiology

## 2017-11-26 ENCOUNTER — Ambulatory Visit (INDEPENDENT_AMBULATORY_CARE_PROVIDER_SITE_OTHER): Payer: PPO | Admitting: Cardiology

## 2017-11-26 VITALS — BP 138/72 | HR 75 | Ht 61.0 in | Wt 166.0 lb

## 2017-11-26 DIAGNOSIS — I2510 Atherosclerotic heart disease of native coronary artery without angina pectoris: Secondary | ICD-10-CM | POA: Diagnosis not present

## 2017-11-26 DIAGNOSIS — I10 Essential (primary) hypertension: Secondary | ICD-10-CM | POA: Diagnosis not present

## 2017-11-26 NOTE — Progress Notes (Signed)
11/26/2017 Jacqueline Burke   14-Jan-1936  [id number]  Primary Physician Darcus Austin, MD Primary Cardiologist: Dr. Radford Pax   Reason for Visit/CC: Chest pain and HTN  HPI:  Jacqueline Burke is a 82 y.o. female, followed by Dr. Radford Pax, who presents to clinic today for chest pain during cardiac rehab as well as for recent increase in BP.   In summary, she has a hx of hypertension, asymptomatic PVCs, ASCAD s/p NSTEMI with peak troponin of 19 in setting of acute respiratory failure and E Coli bacteremia in January 2019. 2D echo at that time showed normal LVEF, elevated LVEDP and cath with  95% RCA stenosis, treated with PCI + DES. She was last seen by Dr. Radford Pax in March and was doing well w/o any cardiac symptoms.   Just recently while in cardiac rehab, she experienced chest discomfort, described as a burning sensation and also has noticed recent increases in her BP (in the 170s at CR). Dr. Radford Pax was notified and recommended that she increase her amlodipine to 7.5 mg daily and to monitor her BP at home, record a log and bring readings to today's OV for review. Dr. Radford Pax also ordered and NST to r/o ischemia. NST was performed on 11/23/17 and was a low risk scan w/o ischemia.   Today in f/u, she reports that she has done well. She denies any recurrent CP. Her BP is better. 138/72 today. Has been monitoring BP at home and SBPs have been in the 130s, occasional 140s. She is tolerating higher dose of amlodipine well w/o side effects.   Cardiac Studies  2D Echo 06/2017 Study Conclusions  - Left ventricle: The estimated ejection fraction was 55%. Doppler   parameters are consistent with both elevated ventricular   end-diastolic filling pressure and elevated left atrial filling   pressure. - Mitral valve: Calcified annulus. Mildly thickened leaflets . - Left atrium: The atrium was mildly dilated. - Atrial septum: There was increased thickness of the septum,   consistent with lipomatous hypertrophy. No  defect or patent   foramen ovale was identified. - Pulmonary arteries: PA peak pressure: 34 mm Hg (S).   [facility] 06/2017 Procedures   CORONARY STENT INTERVENTION  LEFT HEART CATH AND CORONARY ANGIOGRAPHY  Conclusion     Mid RCA lesion is 95% stenosed.  A stent was successfully placed.  Post intervention, there is a 0% residual stenosis.  The left ventricular systolic function is normal.  LV end diastolic pressure is normal.  The left ventricular ejection fraction is 55-65% by visual estimate.       NST 11/23/17 Study Highlights    Nuclear stress EF: 70%.  Normal perfusion with minimal apical thinning No ischemia or scar  Low risk scan     Current Meds  Medication Sig  . acetaminophen (TYLENOL) 500 MG tablet Take 1,000 mg by mouth every 8 (eight) hours as needed for moderate pain or headache.  Marland Kitchen amitriptyline (ELAVIL) 10 MG tablet Take 20 mg by mouth at bedtime.   Marland Kitchen amLODipine (NORVASC) 5 MG tablet Take 1.5 tablets (7.5 mg total) by mouth daily.  . Ascorbic Acid (VITAMIN C PO) Take 1,000 mg by mouth daily.   Marland Kitchen aspirin EC 81 MG tablet Take 81 mg by mouth daily. On hold for procedure  . atorvastatin (LIPITOR) 80 MG tablet Take 1 tablet (80 mg total) by mouth daily at 6 PM.  . cetirizine (ZYRTEC) 10 MG chewable tablet Chew 10 mg by mouth daily.  . Cholecalciferol (VITAMIN  D PO) Take 3,000 Units by mouth daily.   Marland Kitchen docusate sodium (COLACE) 100 MG capsule Take 200 mg by mouth daily.   Marland Kitchen FIBER PO Take 2 tablets by mouth daily.  . fluticasone (FLONASE) 50 MCG/ACT nasal spray Place 2 sprays into both nostrils daily.  Marland Kitchen gabapentin (NEURONTIN) 100 MG capsule Take 100 mg by mouth 2 (two) times daily.   Marland Kitchen HYDROcodone-acetaminophen (NORCO) 5-325 MG tablet Take 1 tablet by mouth every 4 (four) hours as needed for moderate pain.  . metoprolol tartrate (LOPRESSOR) 25 MG tablet Take 1 tablet (25 mg total) by mouth 2 (two) times daily.  . tamsulosin (FLOMAX) 0.4 MG CAPS capsule Take  1 capsule (0.4 mg total) by mouth daily.  . ticagrelor (BRILINTA) 90 MG TABS tablet Take 1 tablet (90 mg total) by mouth 2 (two) times daily.  Marland Kitchen tolterodine (DETROL LA) 4 MG 24 hr capsule Take 4 mg by mouth daily.  . trospium (SANCTURA) 20 MG tablet Take 60 mg by mouth daily.   Allergies  Allergen Reactions  . Ciprofloxacin Hives  . Codeine Hives  . Demerol [Meperidine] Hives  . Darvon [Propoxyphene] Other (See Comments)    Unknown  . Hydrogen Peroxide Other (See Comments)    White blisters  . Tramadol Other (See Comments)    "ITCHY ON THE INSIDE"   Past Medical History:  Diagnosis Date  . Allergic rhinitis   . CAD (coronary artery disease), native coronary artery    NSTEMI with 95% RCA s/p DES to RCA 06/2017  . Diverticulosis   . GERD (gastroesophageal reflux disease)   . Heart murmur    Systolic heart murmur with Aortic valve sclerosis by ECHO  . History of kidney stones   . Hyperlipidemia   . Hypertension   . Osteoarthritis    Erosive OA-MRI of R hand negative for synovitis, only OA-Dr Trudie Reed  . Osteopenia   . Pelvic prolapse   . PVC's (premature ventricular contractions)    Family History  Problem Relation Age of Onset  . Stroke Mother   . Diabetes Mother   . Breast cancer Cousin    Past Surgical History:  Procedure Laterality Date  . BUNIONECTOMY  08/2006   R foot and hammer roe right second toe  . CARDIAC CATHETERIZATION    . CARPAL TUNNEL RELEASE  2002   bil hands  . CORONARY STENT INTERVENTION N/A 06/15/2017   Procedure: CORONARY STENT INTERVENTION;  Surgeon: Lorretta Harp, MD;  Location: [facility];  Service: Cardiovascular;  Laterality: N/A;  . CYSTOSCOPY     with laser lithotripsy Dr. Gloriann Loan 07-20-17   . CYSTOSCOPY W/ URETERAL STENT PLACEMENT Left 06/09/2017   Procedure: CYSTOSCOPY WITH RETROGRADE PYELOGRAM/URETERAL STENT PLACEMENT;  Surgeon: Lucas Mallow, MD;  Location: [facility];  Service: Urology;  Laterality: Left;  . CYSTOSCOPY WITH  RETROGRADE PYELOGRAM, URETEROSCOPY AND STENT PLACEMENT Left 07/20/2017   Procedure: CYSTOSCOPY WITH RETROGRADE PYELOGRAM, LEFT URETEROSCOPY HOLMIUM LASER LITHO  AND STENT EXCHANGE;  Surgeon: Lucas Mallow, MD;  Location: [facility];  Service: Urology;  Laterality: Left;  . HOLMIUM LASER APPLICATION Left 11/20/3084   Procedure: HOLMIUM LASER APPLICATION;  Surgeon: Lucas Mallow, MD;  Location: [facility];  Service: Urology;  Laterality: Left;  . LEFT HEART CATH AND CORONARY ANGIOGRAPHY N/A 06/15/2017   Procedure: LEFT HEART CATH AND CORONARY ANGIOGRAPHY;  Surgeon: Lorretta Harp, MD;  Location: [facility];  Service: Cardiovascular;  Laterality: N/A;  .  TOTAL KNEE ARTHROPLASTY Left 08/2007  . TOTAL KNEE ARTHROPLASTY Right 03/2010   Social History   Socioeconomic History  . Marital status: Married    Spouse name: Not on file  . Number of children: Not on file  . Years of education: Not on file  . Highest education level: Not on file  Occupational History  . Occupation: Retired  Scientific laboratory technician  . Financial resource strain: Not on file  . Food insecurity:    Worry: Never true    Inability: Never true  . Transportation needs:    Medical: No    Non-medical: No  Tobacco Use  . Smoking status: Former Smoker    Packs/day: 1.00    Years: 25.00    Pack years: 25.00    Types: Cigarettes    Last attempt to quit: 06/02/1993    Years since quitting: 24.5  . Smokeless tobacco: Never Used  Substance and Sexual Activity  . Alcohol use: No  . Drug use: No  . Sexual activity: Not Currently  Lifestyle  . Physical activity:    Days per week: 0 days    Minutes per session: 0 min  . Stress: Only a little  Relationships  . Social connections:    Talks on phone: Not on file    Gets together: Not on file    Attends religious service: Not on file    Active member of club or organization: Not on file    Attends meetings of clubs or organizations: Not on file    Relationship status: Not on  file  . Intimate partner violence:    Fear of current or ex partner: Not on file    Emotionally abused: Not on file    Physically abused: Not on file    Forced sexual activity: Not on file  Other Topics Concern  . Not on file  Social History Narrative  . Not on file     Review of Systems: General: negative for chills, fever, night sweats or weight changes.  Cardiovascular: negative for chest pain, dyspnea on exertion, edema, orthopnea, palpitations, paroxysmal nocturnal dyspnea or shortness of breath Dermatological: negative for rash Respiratory: negative for cough or wheezing Urologic: negative for hematuria Abdominal: negative for nausea, vomiting, diarrhea, bright red blood per rectum, melena, or hematemesis Neurologic: negative for visual changes, syncope, or dizziness All other systems reviewed and are otherwise negative except as noted above.   Physical Exam:  Blood pressure 138/72, pulse 75, height 5\' 1"  (1.549 m), weight 166 lb (75.3 kg), SpO2 95 %.  General appearance: alert, cooperative and no distress Neck: no carotid bruit and no JVD Lungs: clear to auscultation bilaterally Heart: regular rate and rhythm, S1, S2 normal, no murmur, click, rub or gallop Extremities: extremities normal, atraumatic, no cyanosis or edema Pulses: 2+ and symmetric Skin: Skin color, texture, turgor normal. No rashes or lesions Neurologic: Grossly normal  EKG not performed -- personally reviewed   ASSESSMENT AND PLAN:   1. CAD: single vessel CAD noted on cath 06/2017, involving the RCA, 95% stenosis in mid portion, treated with PCI + DES. NST 11/23/17 negative for ischemia. No recurrent CP since increasing amlodipine to 7.5 mg daily. She reports full compliance w/ ASA and Brilinta, + BB and statin. Continue cardiac rehab.   2. HTN: controlled. Improved after increase in amlodipine to 7.5 mg daily. No changes made today.    Follow-Up: keep f/u with Dr. Radford Pax in Sep.   [place]  PA-C, [facility]  11/26/2017 11:19 AM

## 2017-11-26 NOTE — Patient Instructions (Addendum)
Medication Instructions:   Your physician recommends that you continue on your current medications as directed. Please refer to the Current Medication list given to you today.   If you need a refill on your cardiac medications before your next appointment, please call your pharmacy.  Labwork: NONE ORDERED  TODAY     Testing/Procedures:  NONE ORDERED  TODAY    Follow-Up: AS SCHEULED   Any Other Special Instructions Will Be Listed Below (If Applicable).                                                                                                                                                   

## 2017-11-27 ENCOUNTER — Encounter (HOSPITAL_COMMUNITY): Payer: PPO

## 2017-11-27 ENCOUNTER — Encounter (HOSPITAL_COMMUNITY)
Admission: RE | Admit: 2017-11-27 | Discharge: 2017-11-27 | Disposition: A | Discharge status: Home / Self Care | Payer: PPO | Source: Ambulatory Visit | Attending: Cardiology | Admitting: Cardiology

## 2017-11-27 DIAGNOSIS — I214 Non-ST elevation (NSTEMI) myocardial infarction: Secondary | ICD-10-CM

## 2017-11-27 DIAGNOSIS — Z955 Presence of coronary angioplasty implant and graft: Secondary | ICD-10-CM

## 2017-11-30 ENCOUNTER — Encounter (HOSPITAL_COMMUNITY)
Admission: RE | Admit: 2017-11-30 | Discharge: 2017-11-30 | Disposition: A | Discharge status: Home / Self Care | Payer: PPO | Source: Ambulatory Visit | Attending: Cardiology | Admitting: Cardiology

## 2017-11-30 ENCOUNTER — Encounter (HOSPITAL_COMMUNITY): Payer: PPO

## 2017-11-30 DIAGNOSIS — Z955 Presence of coronary angioplasty implant and graft: Secondary | ICD-10-CM | POA: Diagnosis not present

## 2017-11-30 DIAGNOSIS — I214 Non-ST elevation (NSTEMI) myocardial infarction: Secondary | ICD-10-CM | POA: Diagnosis not present

## 2017-12-02 ENCOUNTER — Encounter (HOSPITAL_COMMUNITY)
Admission: RE | Admit: 2017-12-02 | Discharge: 2017-12-02 | Disposition: A | Discharge status: Home / Self Care | Payer: PPO | Source: Ambulatory Visit | Attending: Cardiology | Admitting: Cardiology

## 2017-12-02 ENCOUNTER — Encounter (HOSPITAL_COMMUNITY): Payer: PPO

## 2017-12-02 DIAGNOSIS — I214 Non-ST elevation (NSTEMI) myocardial infarction: Secondary | ICD-10-CM

## 2017-12-02 DIAGNOSIS — Z955 Presence of coronary angioplasty implant and graft: Secondary | ICD-10-CM

## 2017-12-04 ENCOUNTER — Encounter (HOSPITAL_COMMUNITY)
Admission: RE | Admit: 2017-12-04 | Discharge: 2017-12-04 | Disposition: A | Discharge status: Home / Self Care | Payer: PPO | Source: Ambulatory Visit | Attending: Cardiology | Admitting: Cardiology

## 2017-12-04 ENCOUNTER — Encounter (HOSPITAL_COMMUNITY): Payer: PPO

## 2017-12-04 DIAGNOSIS — Z955 Presence of coronary angioplasty implant and graft: Secondary | ICD-10-CM

## 2017-12-04 DIAGNOSIS — I214 Non-ST elevation (NSTEMI) myocardial infarction: Secondary | ICD-10-CM

## 2017-12-07 ENCOUNTER — Encounter (HOSPITAL_COMMUNITY): Payer: PPO

## 2017-12-07 ENCOUNTER — Encounter (HOSPITAL_COMMUNITY)
Admission: RE | Admit: 2017-12-07 | Discharge: 2017-12-07 | Disposition: A | Discharge status: Home / Self Care | Payer: PPO | Source: Ambulatory Visit | Attending: Cardiology | Admitting: Cardiology

## 2017-12-07 DIAGNOSIS — I214 Non-ST elevation (NSTEMI) myocardial infarction: Secondary | ICD-10-CM

## 2017-12-07 DIAGNOSIS — Z955 Presence of coronary angioplasty implant and graft: Secondary | ICD-10-CM | POA: Diagnosis not present

## 2017-12-08 NOTE — Progress Notes (Signed)
Cardiac Individual Treatment Plan  Patient Details  Name: Jacqueline Burke MRN: [id number] Date of Birth: 1935-11-28 Referring Provider:     [facility] from 10/29/2017 in [place]  Referring Provider  Fransico Him R MD       Initial Encounter Date:    CARDIAC REHAB PHASE II ORIENTATION from 10/29/2017 in [facility]  Date  10/29/17      Visit Diagnosis: NSTEMI (non-ST elevated myocardial infarction) ([place]) 06/11/17  Status post coronary artery stent placement 06/15/17  Patient's Home Medications on Admission:  Current Outpatient Medications:  .  acetaminophen (TYLENOL) 500 MG tablet, Take 1,000 mg by mouth every 8 (eight) hours as needed for moderate pain or headache., Disp: , Rfl:  .  amitriptyline (ELAVIL) 10 MG tablet, Take 20 mg by mouth at bedtime. , Disp: , Rfl:  .  amLODipine (NORVASC) 5 MG tablet, Take 1.5 tablets (7.5 mg total) by mouth daily., Disp: 120 tablet, Rfl: 3 .  Ascorbic Acid (VITAMIN C PO), Take 1,000 mg by mouth daily. , Disp: , Rfl:  .  aspirin EC 81 MG tablet, Take 81 mg by mouth daily. On hold for procedure, Disp: , Rfl:  .  atorvastatin (LIPITOR) 80 MG tablet, Take 1 tablet (80 mg total) by mouth daily at 6 PM., Disp: 30 tablet, Rfl: 0 .  cetirizine (ZYRTEC) 10 MG chewable tablet, Chew 10 mg by mouth daily., Disp: , Rfl:  .  Cholecalciferol (VITAMIN D PO), Take 3,000 Units by mouth daily. , Disp: , Rfl:  .  docusate sodium (COLACE) 100 MG capsule, Take 200 mg by mouth daily. , Disp: , Rfl:  .  FIBER PO, Take 2 tablets by mouth daily., Disp: , Rfl:  .  fluticasone (FLONASE) 50 MCG/ACT nasal spray, Place 2 sprays into both nostrils daily., Disp: 16 g, Rfl: 2 .  gabapentin (NEURONTIN) 100 MG capsule, Take 100 mg by mouth 2 (two) times daily. , Disp: , Rfl:  .  HYDROcodone-acetaminophen (NORCO) 5-325 MG tablet, Take 1 tablet by mouth every 4 (four) hours as needed for moderate  pain., Disp: 10 tablet, Rfl: 0 .  metoprolol tartrate (LOPRESSOR) 25 MG tablet, Take 1 tablet (25 mg total) by mouth 2 (two) times daily., Disp: 180 tablet, Rfl: 3 .  tamsulosin (FLOMAX) 0.4 MG CAPS capsule, Take 1 capsule (0.4 mg total) by mouth daily., Disp: 10 capsule, Rfl: 3 .  ticagrelor (BRILINTA) 90 MG TABS tablet, Take 1 tablet (90 mg total) by mouth 2 (two) times daily., Disp: 60 tablet, Rfl: 10 .  tolterodine (DETROL LA) 4 MG 24 hr capsule, Take 4 mg by mouth daily., Disp: , Rfl:  .  trospium (SANCTURA) 20 MG tablet, Take 60 mg by mouth daily., Disp: , Rfl:   Past Medical History: Past Medical History:  Diagnosis Date  . Allergic rhinitis   . CAD (coronary artery disease), native coronary artery    NSTEMI with 95% RCA s/p DES to RCA 06/2017  . Diverticulosis   . GERD (gastroesophageal reflux disease)   . Heart murmur    Systolic heart murmur with Aortic valve sclerosis by ECHO  . History of kidney stones   . Hyperlipidemia   . Hypertension   . Osteoarthritis    Erosive OA-MRI of R hand negative for synovitis, only OA-Dr Trudie Reed  . Osteopenia   . Pelvic prolapse   . PVC's (premature ventricular contractions)     Tobacco Use:  Social History   Tobacco Use  Smoking Status Former Smoker  . Packs/day: 1.00  . Years: 25.00  . Pack years: 25.00  . Types: Cigarettes  . Last attempt to quit: 06/02/1993  . Years since quitting: 24.5  Smokeless Tobacco Never Used    Labs: Recent Chemical engineer    Labs for ITP Cardiac and Pulmonary Rehab Latest Ref Rng & Units 06/09/2017 06/16/2017   Cholestrol 0 - 200 mg/dL - 79   LDLCALC 0 - 99 mg/dL - 33   HDL >40 mg/dL - 23(L)   Trlycerides <150 mg/dL - 116   PHART 7.350 - 7.450 7.366 -   PCO2ART 32.0 - 48.0 mmHg 39.0 -   HCO3 20.0 - 28.0 mmol/L 21.9 -   TCO2 22 - 32 mmol/L 23 -   ACIDBASEDEF 0.0 - 2.0 mmol/L 3.0(H) -   O2SAT % 100.0 -      Capillary Blood Glucose: No results found for: GLUCAP   Exercise Target Goals:     Exercise Program Goal: Individual exercise prescription set using results from initial 6 min walk test and THRR while considering  patient's activity barriers and safety.   Exercise Prescription Goal: Initial exercise prescription builds to 30-45 minutes a day of aerobic activity, 2-3 days per week.  Home exercise guidelines will be given to patient during program as part of exercise prescription that the participant will acknowledge.  Activity Barriers & Risk Stratification: Activity Barriers & Cardiac Risk Stratification - 10/29/17 1506      Activity Barriers & Cardiac Risk Stratification   Activity Barriers  Back Problems;Arthritis;Left Knee Replacement;Right Knee Replacement    Cardiac Risk Stratification  High       6 Minute Walk: 6 Minute Walk    Row Name 10/29/17 1454         6 Minute Walk   Phase  Initial     Distance  1239 feet     Walk Time  6 minutes     # of Rest Breaks  0     MPH  2.35     METS  1.99     RPE  13     Perceived Dyspnea   2     VO2 Peak  6.96     Symptoms  Yes (comment)     Comments  Sciatic Nerve Pain 6/10, SOB +2     Resting HR  61 bpm     Resting BP  142/82     Resting Oxygen Saturation   96 %     Exercise Oxygen Saturation  during 6 min walk  97 %     Max Ex. HR  88 bpm     Max Ex. BP  148/60     2 Minute Post BP  110/68        Oxygen Initial Assessment:   Oxygen Re-Evaluation:   Oxygen Discharge (Final Oxygen Re-Evaluation):   Initial Exercise Prescription: Initial Exercise Prescription - 10/29/17 1500      Date of Initial Exercise RX and Referring Provider   Date  10/29/17    Referring Provider  Sueanne Margarita MD       Recumbant Bike   Level  1.5    Watts  5    Minutes  10    METs  2      NuStep   Level  1    SPM  75    Minutes  10    METs  1.5  Track   Laps  8    Minutes  10    METs  2.38      Prescription Details   Frequency (times per week)  3x    Duration  Progress to 30 minutes of continuous  aerobic without signs/symptoms of physical distress      Intensity   THRR 40-80% of Max Heartrate  56-113    Ratings of Perceived Exertion  11-13    Perceived Dyspnea  0-4      Progression   Progression  Continue progressive overload as per policy without signs/symptoms or physical distress.      Resistance Training   Training Prescription  Yes    Weight  2lbs    Reps  10-15       Perform Capillary Blood Glucose checks as needed.  Exercise Prescription Changes: Exercise Prescription Changes    Row Name 11/04/17 [phone] 11/16/17 0949 11/30/17 0950         Response to Exercise   Blood Pressure (Admit)  138/74  120/82  104/68     Blood Pressure (Exercise)  158/72  142/78  142/62     Blood Pressure (Exit)  112/72  128/82  138/60     Heart Rate (Admit)  68 bpm  58 bpm  62 bpm     Heart Rate (Exercise)  93 bpm  75 bpm  89 bpm     Heart Rate (Exit)  53 bpm  57 bpm  59 bpm     Rating of Perceived Exertion (Exercise)  13  13  14      Symptoms  none  left hip pain from sciatica  left hip pain from sciatica     Duration  Progress to 30 minutes of  aerobic without signs/symptoms of physical distress  Progress to 30 minutes of  aerobic without signs/symptoms of physical distress  Progress to 30 minutes of  aerobic without signs/symptoms of physical distress     Intensity  THRR unchanged  THRR unchanged  THRR unchanged       Progression   Progression  Continue to progress workloads to maintain intensity without signs/symptoms of physical distress.  Continue to progress workloads to maintain intensity without signs/symptoms of physical distress.  Continue to progress workloads to maintain intensity without signs/symptoms of physical distress.     Average METs  2  2.4  2.8       Resistance Training   Training Prescription  No Relaxation day, no weights.  Yes  Yes     Weight  -  4lbs  4lbs     Reps  -  10-15  10-15     Time  -  10 Minutes  10 Minutes       Interval Training   Interval  Training  No  No  No       Recumbant Bike   Level  -  -  -     Watts  -  -  -     Minutes  10  -  -     METs  -  -  -       NuStep   Level  1  4  5      SPM  75  85  85     Minutes  10  10  10      METs  1.7  2.4  2.6       Arm Ergometer   Level  1  1  2  Minutes  10  10  10        Track   Laps  8  8  11      Minutes  10  10  10      METs  2.39  2.39  2.91       Home Exercise Plan   Plans to continue exercise at  -  Home (comment)  Home (comment)     Frequency  -  Add 1 additional day to program exercise sessions.  Add 1 additional day to program exercise sessions.     Initial Home Exercises Provided  -  11/09/17  11/09/17        Exercise Comments: Exercise Comments    Row Name 11/04/17 1010 11/09/17 1027 11/16/17 1012 11/30/17 1000     Exercise Comments  Patient is off to a good start with exercise. Reviewed RPE scale with patient.  Reviewed home exercise guidelines, METs and goals with patient.  Reviewed METs with patient.  Reviewed METs and goals with patient.       Exercise Goals and Review:   Exercise Goals Re-Evaluation : Exercise Goals Re-Evaluation    Row Name 11/04/17 1000 11/09/17 1027 11/30/17 1000         Exercise Goal Re-Evaluation   Exercise Goals Review  Able to understand and use rate of perceived exertion (RPE) scale;Increase Physical Activity;Increase Strength and Stamina  Able to understand and use rate of perceived exertion (RPE) scale;Knowledge and understanding of Target Heart Rate Range (THRR);Understanding of Exercise Prescription  Increase Strength and Stamina     Comments  Patient wants to increase stamina and decrease SOB when doing activities. Reviewed RPE scale, and pt understands and is able to use the RPE scale appropriately.  Reviewed home exercise guidelines with patient including THRR, RPE scale, and endpoints for exercise.  Patient states that she was able to walk from valet parking to the cardiac rehab department for the first time.  Pt is proud of being able to walk this distance. Pt states that her SOB has gotten "somewhat" better. Discussed patient's exercise plans, and she is doing some walking on her non-cardiac rehab days, but walking is limited by sciatic nerve pain.     Expected Outcomes  Increase workloads to help achieve goal of increased stamina and decreased SOB with activity.  Pt will add 1-2 days walking in addition to exercise at [facility].  Continue to increase workloads as tolerated to help improve strength and stamina and help decrease the amount of SOB experienced.         Discharge Exercise Prescription (Final Exercise Prescription Changes): Exercise Prescription Changes - 11/30/17 0950      Response to Exercise   Blood Pressure (Admit)  104/68    Blood Pressure (Exercise)  142/62    Blood Pressure (Exit)  138/60    Heart Rate (Admit)  62 bpm    Heart Rate (Exercise)  89 bpm    Heart Rate (Exit)  59 bpm    Rating of Perceived Exertion (Exercise)  14    Symptoms  left hip pain from sciatica    Duration  Progress to 30 minutes of  aerobic without signs/symptoms of physical distress    Intensity  THRR unchanged      Progression   Progression  Continue to progress workloads to maintain intensity without signs/symptoms of physical distress.    Average METs  2.8      Resistance Training   Training Prescription  Yes  Weight  4lbs    Reps  10-15    Time  10 Minutes      Interval Training   Interval Training  No      NuStep   Level  5    SPM  85    Minutes  10    METs  2.6      Arm Ergometer   Level  2    Minutes  10      Track   Laps  11    Minutes  10    METs  2.91      Home Exercise Plan   Plans to continue exercise at  Home (comment)    Frequency  Add 1 additional day to program exercise sessions.    Initial Home Exercises Provided  11/09/17       Nutrition:  Target Goals: Understanding of nutrition guidelines, daily intake of sodium 1500mg , cholesterol 200mg , calories 30% from  fat and 7% or less from saturated fats, daily to have 5 or more servings of fruits and vegetables.  Biometrics: Pre Biometrics - 10/29/17 1506      Pre Biometrics   Height  5\' 1"  (1.549 m)    Weight  166 lb 7.2 oz (75.5 kg)    Waist Circumference  37 inches    Hip Circumference  43 inches    Waist to Hip Ratio  0.86 %    BMI (Calculated)  31.47    Triceps Skinfold  34 mm    % Body Fat  44.1 %    Grip Strength  24 kg    Flexibility  12 in    Single Leg Stand  15.5 seconds        Nutrition Therapy Plan and Nutrition Goals: Nutrition Therapy & Goals - 11/02/17 1031      Nutrition Therapy   Diet  Heart Healthy      Personal Nutrition Goals   Nutrition Goal  Pt to identify food quantities necessary to achieve weight loss of 6-24 lb at graduation from cardiac rehab.       Intervention Plan   Intervention  Prescribe, educate and counsel regarding individualized specific dietary modifications aiming towards targeted core components such as weight, hypertension, lipid management, diabetes, heart failure and other comorbidities.    Expected Outcomes  Short Term Goal: Understand basic principles of dietary content, such as calories, fat, sodium, cholesterol and nutrients.;Long Term Goal: Adherence to prescribed nutrition plan.       Nutrition Assessments: Nutrition Assessments - 11/02/17 1522      MEDFICTS Scores   Pre Score  26       Nutrition Goals Re-Evaluation:   Nutrition Goals Re-Evaluation:   Nutrition Goals Discharge (Final Nutrition Goals Re-Evaluation):   Psychosocial: Target Goals: Acknowledge presence or absence of significant depression and/or stress, maximize coping skills, provide positive support system. Participant is able to verbalize types and ability to use techniques and skills needed for reducing stress and depression.  Initial Review & Psychosocial Screening: Initial Psych Review & Screening - 10/29/17 1544      Initial Review   Current issues  with  None Identified      Family Dynamics   Good Support System?  Yes Collie Siad has her husband and children for support      Barriers   Psychosocial barriers to participate in program  There are no identifiable barriers or psychosocial needs.      Screening Interventions   Interventions  Encouraged to exercise  Quality of Life Scores: Quality of Life - 10/29/17 1512      Quality of Life Scores   Health/Function Pre  30 %    Socioeconomic Pre  30 %    Psych/Spiritual Pre  30 %    Family Pre  30 %    GLOBAL Pre  30 %      Scores of 19 and below usually indicate a poorer quality of life in these areas.  A difference of  2-3 points is a clinically meaningful difference.  A difference of 2-3 points in the total score of the Quality of Life Index has been associated with significant improvement in overall quality of life, self-image, physical symptoms, and general health in studies assessing change in quality of life.  PHQ-9: Recent Review Flowsheet Data    Depression screen [facility] 2/9 11/04/2017   Decreased Interest 0   Down, Depressed, Hopeless 0   PHQ - 2 Score 0     Interpretation of Total Score  Total Score Depression Severity:  1-4 = Minimal depression, 5-9 = Mild depression, 10-14 = Moderate depression, 15-19 = Moderately severe depression, 20-27 = Severe depression   Psychosocial Evaluation and Intervention:   Psychosocial Re-Evaluation: Psychosocial Re-Evaluation    [place] Name 11/12/17 1118 12/08/17 1100           Psychosocial Re-Evaluation   Current issues with  None Identified  None Identified      Interventions  Encouraged to attend Cardiac Rehabilitation for the exercise  Encouraged to attend Cardiac Rehabilitation for the exercise      Continue Psychosocial Services   No Follow up required  No Follow up required         Psychosocial Discharge (Final Psychosocial Re-Evaluation): Psychosocial Re-Evaluation - 12/08/17 1100      Psychosocial Re-Evaluation    Current issues with  None Identified    Interventions  Encouraged to attend Cardiac Rehabilitation for the exercise    Continue Psychosocial Services   No Follow up required       Vocational Rehabilitation: Provide vocational rehab assistance to qualifying candidates.   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 Participant will state understanding/return demonstration of topics presented.  Learning Barriers/Preferences: Learning Barriers/Preferences - 10/29/17 1508      Learning Barriers/Preferences   Learning Barriers  Sight    Learning Preferences  Skilled Demonstration;Individual Instruction;Verbal Instruction;Written Material;Video       Education Topics: Count Your Pulse:  -Group instruction provided by verbal instruction, demonstration, patient participation and written materials to support subject.  Instructors address importance of being able to find your pulse and how to count your pulse when at home without a heart monitor.  Patients get hands on experience counting their pulse with staff help and individually.   Heart Attack, Angina, and Risk Factor Modification:  -Group instruction provided by verbal instruction, video, and written materials to support subject.  Instructors address signs and symptoms of angina and heart attacks.    Also discuss risk factors for heart disease and how to make changes to improve heart health risk factors.   CARDIAC REHAB PHASE II EXERCISE from 11/25/2017 in [facility]  [place]  Date  11/18/17  Instruction Review Code  2- Demonstrated Understanding      Functional  Fitness:  -Group instruction provided by verbal instruction, demonstration, patient participation, and written materials to support subject.  Instructors address safety measures for doing things around the house.  Discuss how to get up and down off the floor, how to pick things up properly, how to safely get out of a chair without assistance, and balance training.   Meditation and Mindfulness:  -Group instruction provided by verbal instruction, patient participation, and written materials to support subject.  Instructor addresses importance of mindfulness and meditation practice to help reduce stress and improve awareness.  Instructor also leads participants through a meditation exercise.    Stretching for Flexibility and Mobility:  -Group instruction provided by verbal instruction, patient participation, and written materials to support subject.  Instructors lead participants through series of stretches that are designed to increase flexibility thus improving mobility.  These stretches are additional exercise for major muscle groups that are typically performed during regular warm up and cool down.   Hands Only CPR:  -Group verbal, video, and participation provides a basic overview of AHA guidelines for community CPR. Role-play of emergencies allow participants the opportunity to practice calling for help and chest compression technique with discussion of AED use.   Hypertension: -Group verbal and written instruction that provides a basic overview of hypertension including the most recent diagnostic guidelines, risk factor reduction with self-care instructions and medication management.    Nutrition I class: Heart Healthy Eating:  -Group instruction provided by PowerPoint slides, verbal discussion, and written materials to support subject matter. The instructor gives an explanation and review of the Therapeutic Lifestyle Changes diet recommendations, which includes a discussion on lipid goals,  dietary fat, sodium, fiber, plant stanol/sterol esters, sugar, and the components of a well-balanced, healthy diet.   CARDIAC REHAB PHASE II EXERCISE from 11/25/2017 in [place]  Date  10/29/17  Educator  RD      Nutrition II class: Lifestyle Skills:  -Group instruction provided by PowerPoint slides, verbal discussion, and written materials to support subject matter. The instructor gives an explanation and review of label reading, grocery shopping for heart health, heart healthy recipe modifications, and ways to make healthier choices when eating out.   CARDIAC REHAB PHASE II EXERCISE from 11/25/2017 in [place]  Date  10/29/17  Educator  RD      Diabetes Question & Answer:  -Group instruction provided by PowerPoint slides, verbal discussion, and written materials to support subject matter. The instructor gives an explanation and review of diabetes co-morbidities, pre- and post-prandial blood glucose goals, pre-exercise blood glucose goals, signs, symptoms, and treatment of hypoglycemia and hyperglycemia, and foot care basics.   CARDIAC REHAB PHASE II EXERCISE from 11/25/2017 in [place]  Date  11/25/17  Educator  RD  Instruction Review Code  2- Demonstrated Understanding      Diabetes Blitz:  -Group instruction provided by PowerPoint slides, verbal discussion, and written materials to support subject matter. The instructor gives an explanation and review of the physiology behind type 1 and type 2 diabetes, diabetes medications and rational behind using different medications, pre- and post-prandial blood glucose recommendations and Hemoglobin A1c goals, diabetes diet, and exercise including blood glucose guidelines for exercising safely.    Portion Distortion:  -Group instruction provided by PowerPoint slides, verbal discussion, written materials, and  food models to support subject matter.  The instructor gives an explanation of serving size versus portion size, changes in portions sizes over the last 20 years, and what consists of a serving from each food group.   Stress Management:  -Group instruction provided by verbal instruction, video, and written materials to support subject matter.  Instructors review role of stress in heart disease and how to cope with stress positively.     Exercising on Your Own:  -Group instruction provided by verbal instruction, power point, and written materials to support subject.  Instructors discuss benefits of exercise, components of exercise, frequency and intensity of exercise, and end points for exercise.  Also discuss use of nitroglycerin and activating EMS.  Review options of places to exercise outside of rehab.  Review guidelines for sex with heart disease.   CARDIAC REHAB PHASE II EXERCISE from 11/25/2017 in [place]  Date  11/20/17  Instruction Review Code  2- Demonstrated Understanding      Cardiac Drugs I:  -Group instruction provided by verbal instruction and written materials to support subject.  Instructor reviews cardiac drug classes: antiplatelets, anticoagulants, beta blockers, and statins.  Instructor discusses reasons, side effects, and lifestyle considerations for each drug class.   Cardiac Drugs II:  -Group instruction provided by verbal instruction and written materials to support subject.  Instructor reviews cardiac drug classes: angiotensin converting enzyme inhibitors (ACE-I), angiotensin II receptor blockers (ARBs), nitrates, and calcium channel blockers.  Instructor discusses reasons, side effects, and lifestyle considerations for each drug class.   CARDIAC REHAB PHASE II EXERCISE from 11/25/2017 in [place]  Date  11/11/17  Instruction Review Code  2- Demonstrated Understanding      Anatomy and Physiology of the Circulatory System:  Group verbal and  written instruction and models provide basic cardiac anatomy and physiology, with the coronary electrical and arterial systems. Review of: AMI, Angina, Valve disease, Heart Failure, Peripheral Artery Disease, Cardiac Arrhythmia, Pacemakers, and the ICD.   Other Education:  -Group or individual verbal, written, or video instructions that support the educational goals of the cardiac rehab program.   Holiday Eating Survival Tips:  -Group instruction provided by PowerPoint slides, verbal discussion, and written materials to support subject matter. The instructor gives patients tips, tricks, and techniques to help them not only survive but enjoy the holidays despite the onslaught of food that accompanies the holidays.   Knowledge Questionnaire Score: Knowledge Questionnaire Score - 10/29/17 1510      Knowledge Questionnaire Score   Pre Score  16/24       Core Components/Risk Factors/Patient Goals at Admission: Personal Goals and Risk Factors at Admission - 10/29/17 1452      Core Components/Risk Factors/Patient Goals on Admission    Weight Management  Yes;Weight Loss    Intervention  Weight Management: Develop a combined nutrition and exercise program designed to reach desired caloric intake, while maintaining appropriate intake of nutrient and fiber, sodium and fats, and appropriate energy expenditure required for the weight goal.;Weight Management: Provide education and appropriate resources to help participant work on and attain dietary goals.;Weight Management/Obesity: Establish reasonable short term and long term weight goals.    Admit Weight  166 lb 7.2 oz (75.5 kg)    Goal Weight: Short Term  163 lb (73.9 kg)    Goal Weight: Long Term  160 lb (72.6 kg)    Expected Outcomes  Short Term: Continue to assess and modify interventions until  short term weight is achieved;Long Term: Adherence to nutrition and physical activity/exercise program aimed toward attainment of established weight  goal;Weight Maintenance: Understanding of the daily nutrition guidelines, which includes 25-35% calories from fat, 7% or less cal from saturated fats, less than 200mg  cholesterol, less than 1.5gm of sodium, & 5 or more servings of fruits and vegetables daily;Weight Loss: Understanding of general recommendations for a balanced deficit meal plan, which promotes 1-2 lb weight loss per week and includes a negative energy balance of [phone] kcal/d;Understanding recommendations for meals to include 15-35% energy as protein, 25-35% energy from fat, 35-60% energy from carbohydrates, less than 200mg  of dietary cholesterol, 20-35 gm of total fiber daily;Understanding of distribution of calorie intake throughout the day with the consumption of 4-5 meals/snacks    Hypertension  Yes    Intervention  Monitor prescription use compliance.;Provide education on lifestyle modifcations including regular physical activity/exercise, weight management, moderate sodium restriction and increased consumption of fresh fruit, vegetables, and low fat dairy, alcohol moderation, and smoking cessation.    Expected Outcomes  Short Term: Continued assessment and intervention until BP is < 140/19mm HG in hypertensive participants. < 130/69mm HG in hypertensive participants with diabetes, heart failure or chronic kidney disease.;Long Term: Maintenance of blood pressure at goal levels.    Lipids  Yes    Intervention  Provide education and support for participant on nutrition & aerobic/resistive exercise along with prescribed medications to achieve LDL 70mg , HDL >40mg .    Expected Outcomes  Short Term: Participant states understanding of desired cholesterol values and is compliant with medications prescribed. Participant is following exercise prescription and nutrition guidelines.;Long Term: Cholesterol controlled with medications as prescribed, with individualized exercise RX and with personalized nutrition plan. Value goals: LDL < 70mg , HDL >  40 mg.       Core Components/Risk Factors/Patient Goals Review:  Goals and Risk Factor Review    Row Name 11/12/17 1115 12/08/17 1058           Core Components/Risk Factors/Patient Goals Review   Personal Goals Review  Weight Management/Obesity;Lipids  Weight Management/Obesity;Lipids      Review  Collie Siad is off to a good start to exercise will continue to monitor blood pressures as Collie Siad has had some moderate variations.  Sue's vital signs have been stable at cardiac rehab. Collie Siad is doing well with exercise and rest as needed. Sue's weights have been stable.      Expected Outcomes  Collie Siad will continue to take her medications as presribed and participate in phase 2 cardiac rehab.  Collie Siad will continue to take her medications as presribed and participate in phase 2 cardiac rehab.         Core Components/Risk Factors/Patient Goals at Discharge (Final Review):  Goals and Risk Factor Review - 12/08/17 1058      Core Components/Risk Factors/Patient Goals Review   Personal Goals Review  Weight Management/Obesity;Lipids    Review  Sue's vital signs have been stable at cardiac rehab. Collie Siad is doing well with exercise and rest as needed. Sue's weights have been stable.    Expected Outcomes  Collie Siad will continue to take her medications as presribed and participate in phase 2 cardiac rehab.       ITP Comments: ITP Comments    Row Name 10/29/17 1402 11/12/17 1112 12/08/17 1057       ITP Comments  Dr. Fransico Him, Medical Director  30 Day ITP review. Patient is off to a good start to exercise despite dealing with chronic sciatica  and hip pain  30 Day ITP review. Patient has good attendance and participation in phase 2 cardiac rehab        Comments: See ITP comments.Barnet Pall, RN,BSN 12/08/2017 11:03 AM

## 2017-12-09 ENCOUNTER — Encounter (HOSPITAL_COMMUNITY)
Admission: RE | Admit: 2017-12-09 | Discharge: 2017-12-09 | Disposition: A | Discharge status: Home / Self Care | Payer: PPO | Source: Ambulatory Visit | Attending: Cardiology | Admitting: Cardiology

## 2017-12-09 ENCOUNTER — Encounter (HOSPITAL_COMMUNITY): Payer: PPO

## 2017-12-09 DIAGNOSIS — Z955 Presence of coronary angioplasty implant and graft: Secondary | ICD-10-CM | POA: Diagnosis not present

## 2017-12-11 ENCOUNTER — Encounter (HOSPITAL_COMMUNITY)
Admission: RE | Admit: 2017-12-11 | Discharge: 2017-12-11 | Disposition: A | Discharge status: Home / Self Care | Payer: PPO | Source: Ambulatory Visit | Attending: Cardiology | Admitting: Cardiology

## 2017-12-11 ENCOUNTER — Encounter (HOSPITAL_COMMUNITY): Payer: PPO

## 2017-12-11 DIAGNOSIS — I214 Non-ST elevation (NSTEMI) myocardial infarction: Secondary | ICD-10-CM

## 2017-12-11 DIAGNOSIS — Z955 Presence of coronary angioplasty implant and graft: Secondary | ICD-10-CM | POA: Diagnosis not present

## 2017-12-14 ENCOUNTER — Encounter (HOSPITAL_COMMUNITY): Payer: PPO

## 2017-12-14 ENCOUNTER — Encounter (HOSPITAL_COMMUNITY)
Admission: RE | Admit: 2017-12-14 | Discharge: 2017-12-14 | Disposition: A | Discharge status: Home / Self Care | Payer: PPO | Source: Ambulatory Visit | Attending: Cardiology | Admitting: Cardiology

## 2017-12-14 DIAGNOSIS — I214 Non-ST elevation (NSTEMI) myocardial infarction: Secondary | ICD-10-CM

## 2017-12-14 DIAGNOSIS — Z955 Presence of coronary angioplasty implant and graft: Secondary | ICD-10-CM

## 2017-12-16 ENCOUNTER — Encounter (HOSPITAL_COMMUNITY): Payer: PPO

## 2017-12-16 ENCOUNTER — Encounter (HOSPITAL_COMMUNITY)
Admission: RE | Admit: 2017-12-16 | Discharge: 2017-12-16 | Disposition: A | Discharge status: Home / Self Care | Payer: PPO | Source: Ambulatory Visit | Attending: Cardiology | Admitting: Cardiology

## 2017-12-16 DIAGNOSIS — Z955 Presence of coronary angioplasty implant and graft: Secondary | ICD-10-CM | POA: Diagnosis not present

## 2017-12-16 DIAGNOSIS — I214 Non-ST elevation (NSTEMI) myocardial infarction: Secondary | ICD-10-CM

## 2017-12-17 DIAGNOSIS — M154 Erosive (osteo)arthritis: Secondary | ICD-10-CM | POA: Diagnosis not present

## 2017-12-17 DIAGNOSIS — E669 Obesity, unspecified: Secondary | ICD-10-CM | POA: Diagnosis not present

## 2017-12-17 DIAGNOSIS — M2550 Pain in unspecified joint: Secondary | ICD-10-CM | POA: Diagnosis not present

## 2017-12-17 DIAGNOSIS — M7061 Trochanteric bursitis, right hip: Secondary | ICD-10-CM | POA: Diagnosis not present

## 2017-12-17 DIAGNOSIS — Z6830 Body mass index (BMI) 30.0-30.9, adult: Secondary | ICD-10-CM | POA: Diagnosis not present

## 2017-12-17 DIAGNOSIS — M150 Primary generalized (osteo)arthritis: Secondary | ICD-10-CM | POA: Diagnosis not present

## 2017-12-18 ENCOUNTER — Encounter (HOSPITAL_COMMUNITY)
Admission: RE | Admit: 2017-12-18 | Discharge: 2017-12-18 | Disposition: A | Discharge status: Home / Self Care | Payer: PPO | Source: Ambulatory Visit | Attending: Cardiology | Admitting: Cardiology

## 2017-12-18 ENCOUNTER — Encounter (HOSPITAL_COMMUNITY): Payer: PPO

## 2017-12-18 DIAGNOSIS — I214 Non-ST elevation (NSTEMI) myocardial infarction: Secondary | ICD-10-CM

## 2017-12-18 DIAGNOSIS — Z955 Presence of coronary angioplasty implant and graft: Secondary | ICD-10-CM | POA: Diagnosis not present

## 2017-12-21 ENCOUNTER — Encounter (HOSPITAL_COMMUNITY)
Admission: RE | Admit: 2017-12-21 | Discharge: 2017-12-21 | Disposition: A | Discharge status: Home / Self Care | Payer: PPO | Source: Ambulatory Visit | Attending: Cardiology | Admitting: Cardiology

## 2017-12-21 ENCOUNTER — Encounter (HOSPITAL_COMMUNITY): Payer: PPO

## 2017-12-21 DIAGNOSIS — Z955 Presence of coronary angioplasty implant and graft: Secondary | ICD-10-CM

## 2017-12-21 DIAGNOSIS — I214 Non-ST elevation (NSTEMI) myocardial infarction: Secondary | ICD-10-CM

## 2017-12-23 ENCOUNTER — Encounter (HOSPITAL_COMMUNITY)
Admission: RE | Admit: 2017-12-23 | Discharge: 2017-12-23 | Disposition: A | Discharge status: Home / Self Care | Payer: PPO | Source: Ambulatory Visit | Attending: Cardiology | Admitting: Cardiology

## 2017-12-23 ENCOUNTER — Encounter (HOSPITAL_COMMUNITY): Payer: PPO

## 2017-12-23 DIAGNOSIS — I214 Non-ST elevation (NSTEMI) myocardial infarction: Secondary | ICD-10-CM

## 2017-12-23 DIAGNOSIS — Z955 Presence of coronary angioplasty implant and graft: Secondary | ICD-10-CM

## 2017-12-25 ENCOUNTER — Encounter (HOSPITAL_COMMUNITY)
Admission: RE | Admit: 2017-12-25 | Discharge: 2017-12-25 | Disposition: A | Discharge status: Home / Self Care | Payer: PPO | Source: Ambulatory Visit | Attending: Cardiology | Admitting: Cardiology

## 2017-12-25 ENCOUNTER — Encounter (HOSPITAL_COMMUNITY): Payer: PPO

## 2017-12-25 DIAGNOSIS — I214 Non-ST elevation (NSTEMI) myocardial infarction: Secondary | ICD-10-CM

## 2017-12-25 DIAGNOSIS — Z955 Presence of coronary angioplasty implant and graft: Secondary | ICD-10-CM | POA: Diagnosis not present

## 2017-12-28 ENCOUNTER — Encounter (HOSPITAL_COMMUNITY): Admission: RE | Admit: 2017-12-28 | Payer: PPO | Source: Ambulatory Visit

## 2017-12-28 ENCOUNTER — Encounter (HOSPITAL_COMMUNITY): Payer: PPO

## 2017-12-28 DIAGNOSIS — J329 Chronic sinusitis, unspecified: Secondary | ICD-10-CM | POA: Diagnosis not present

## 2017-12-30 ENCOUNTER — Encounter (HOSPITAL_COMMUNITY): Payer: PPO

## 2018-01-01 ENCOUNTER — Encounter (HOSPITAL_COMMUNITY): Payer: PPO

## 2018-01-04 ENCOUNTER — Encounter (HOSPITAL_COMMUNITY)
Admission: RE | Admit: 2018-01-04 | Discharge: 2018-01-04 | Disposition: A | Discharge status: Home / Self Care | Payer: PPO | Source: Ambulatory Visit | Attending: Cardiology | Admitting: Cardiology

## 2018-01-04 ENCOUNTER — Encounter (HOSPITAL_COMMUNITY): Payer: PPO

## 2018-01-04 DIAGNOSIS — Z955 Presence of coronary angioplasty implant and graft: Secondary | ICD-10-CM | POA: Diagnosis not present

## 2018-01-04 DIAGNOSIS — I214 Non-ST elevation (NSTEMI) myocardial infarction: Secondary | ICD-10-CM

## 2018-01-05 NOTE — Progress Notes (Signed)
Cardiac Individual Treatment Plan  Patient Details  Name: Jacqueline Burke MRN: [id number] Date of Birth: 08/04/1935 Referring Provider:     [facility] from 10/29/2017 in [place]  Referring Provider  Fransico Him R MD       Initial Encounter Date:    CARDIAC REHAB PHASE II ORIENTATION from 10/29/2017 in [place] [facility]  Date  10/29/17      Visit Diagnosis: NSTEMI (non-ST elevated myocardial infarction) ([place]) 06/11/17  Status post coronary artery stent placement 06/15/17  Patient's Home Medications on Admission:  Current Outpatient Medications:  .  acetaminophen (TYLENOL) 500 MG tablet, Take 1,000 mg by mouth every 8 (eight) hours as needed for moderate pain or headache., Disp: , Rfl:  .  amitriptyline (ELAVIL) 10 MG tablet, Take 20 mg by mouth at bedtime. , Disp: , Rfl:  .  amLODipine (NORVASC) 5 MG tablet, Take 1.5 tablets (7.5 mg total) by mouth daily., Disp: 120 tablet, Rfl: 3 .  Ascorbic Acid (VITAMIN C PO), Take 1,000 mg by mouth daily. , Disp: , Rfl:  .  aspirin EC 81 MG tablet, Take 81 mg by mouth daily. On hold for procedure, Disp: , Rfl:  .  atorvastatin (LIPITOR) 80 MG tablet, Take 1 tablet (80 mg total) by mouth daily at 6 PM., Disp: 30 tablet, Rfl: 0 .  cetirizine (ZYRTEC) 10 MG chewable tablet, Chew 10 mg by mouth daily., Disp: , Rfl:  .  Cholecalciferol (VITAMIN D PO), Take 3,000 Units by mouth daily. , Disp: , Rfl:  .  docusate sodium (COLACE) 100 MG capsule, Take 200 mg by mouth daily. , Disp: , Rfl:  .  FIBER PO, Take 2 tablets by mouth daily., Disp: , Rfl:  .  fluticasone (FLONASE) 50 MCG/ACT nasal spray, Place 2 sprays into both nostrils daily., Disp: 16 g, Rfl: 2 .  gabapentin (NEURONTIN) 100 MG capsule, Take 100 mg by mouth 2 (two) times daily. , Disp: , Rfl:  .  HYDROcodone-acetaminophen (NORCO) 5-325 MG tablet, Take 1 tablet by mouth every 4 (four) hours as needed for moderate  pain., Disp: 10 tablet, Rfl: 0 .  metoprolol tartrate (LOPRESSOR) 25 MG tablet, Take 1 tablet (25 mg total) by mouth 2 (two) times daily., Disp: 180 tablet, Rfl: 3 .  tamsulosin (FLOMAX) 0.4 MG CAPS capsule, Take 1 capsule (0.4 mg total) by mouth daily., Disp: 10 capsule, Rfl: 3 .  ticagrelor (BRILINTA) 90 MG TABS tablet, Take 1 tablet (90 mg total) by mouth 2 (two) times daily., Disp: 60 tablet, Rfl: 10 .  tolterodine (DETROL LA) 4 MG 24 hr capsule, Take 4 mg by mouth daily., Disp: , Rfl:  .  trospium (SANCTURA) 20 MG tablet, Take 60 mg by mouth daily., Disp: , Rfl:   Past Medical History: Past Medical History:  Diagnosis Date  . Allergic rhinitis   . CAD (coronary artery disease), native coronary artery    NSTEMI with 95% RCA s/p DES to RCA 06/2017  . Diverticulosis   . GERD (gastroesophageal reflux disease)   . Heart murmur    Systolic heart murmur with Aortic valve sclerosis by ECHO  . History of kidney stones   . Hyperlipidemia   . Hypertension   . Osteoarthritis    Erosive OA-MRI of R hand negative for synovitis, only OA-Dr Trudie Reed  . Osteopenia   . Pelvic prolapse   . PVC's (premature ventricular contractions)     Tobacco Use:  Social History   Tobacco Use  Smoking Status Former Smoker  . Packs/day: 1.00  . Years: 25.00  . Pack years: 25.00  . Types: Cigarettes  . Last attempt to quit: 06/02/1993  . Years since quitting: 24.6  Smokeless Tobacco Never Used    Labs: Recent Chemical engineer    Labs for ITP Cardiac and Pulmonary Rehab Latest Ref Rng & Units 06/09/2017 06/16/2017   Cholestrol 0 - 200 mg/dL - 79   LDLCALC 0 - 99 mg/dL - 33   HDL >40 mg/dL - 23(L)   Trlycerides <150 mg/dL - 116   PHART 7.350 - 7.450 7.366 -   PCO2ART 32.0 - 48.0 mmHg 39.0 -   HCO3 20.0 - 28.0 mmol/L 21.9 -   TCO2 22 - 32 mmol/L 23 -   ACIDBASEDEF 0.0 - 2.0 mmol/L 3.0(H) -   O2SAT % 100.0 -      Capillary Blood Glucose: No results found for: GLUCAP   Exercise Target Goals:     Exercise Program Goal: Individual exercise prescription set using results from initial 6 min walk test and THRR while considering  patient's activity barriers and safety.   Exercise Prescription Goal: Initial exercise prescription builds to 30-45 minutes a day of aerobic activity, 2-3 days per week.  Home exercise guidelines will be given to patient during program as part of exercise prescription that the participant will acknowledge.  Activity Barriers & Risk Stratification: Activity Barriers & Cardiac Risk Stratification - 10/29/17 1506      Activity Barriers & Cardiac Risk Stratification   Activity Barriers  Back Problems;Arthritis;Left Knee Replacement;Right Knee Replacement    Cardiac Risk Stratification  High       6 Minute Walk: 6 Minute Walk    Row Name 10/29/17 1454         6 Minute Walk   Phase  Initial     Distance  1239 feet     Walk Time  6 minutes     # of Rest Breaks  0     MPH  2.35     METS  1.99     RPE  13     Perceived Dyspnea   2     VO2 Peak  6.96     Symptoms  Yes (comment)     Comments  Sciatic Nerve Pain 6/10, SOB +2     Resting HR  61 bpm     Resting BP  142/82     Resting Oxygen Saturation   96 %     Exercise Oxygen Saturation  during 6 min walk  97 %     Max Ex. HR  88 bpm     Max Ex. BP  148/60     2 Minute Post BP  110/68        Oxygen Initial Assessment:   Oxygen Re-Evaluation:   Oxygen Discharge (Final Oxygen Re-Evaluation):   Initial Exercise Prescription: Initial Exercise Prescription - 10/29/17 1500      Date of Initial Exercise RX and Referring Provider   Date  10/29/17    Referring Provider  Sueanne Margarita MD       Recumbant Bike   Level  1.5    Watts  5    Minutes  10    METs  2      NuStep   Level  1    SPM  75    Minutes  10    METs  1.5  Track   Laps  8    Minutes  10    METs  2.38      Prescription Details   Frequency (times per week)  3x    Duration  Progress to 30 minutes of continuous  aerobic without signs/symptoms of physical distress      Intensity   THRR 40-80% of Max Heartrate  56-113    Ratings of Perceived Exertion  11-13    Perceived Dyspnea  0-4      Progression   Progression  Continue progressive overload as per policy without signs/symptoms or physical distress.      Resistance Training   Training Prescription  Yes    Weight  2lbs    Reps  10-15       Perform Capillary Blood Glucose checks as needed.  Exercise Prescription Changes: Exercise Prescription Changes    Row Name 11/04/17 [phone] 11/16/17 0949 11/30/17 0950 12/14/17 0947 01/04/18 0949     Response to Exercise   Blood Pressure (Admit)  138/74  120/82  104/68  122/62  122/72   Blood Pressure (Exercise)  158/72  142/78  142/62  142/54  148/86   Blood Pressure (Exit)  112/72  128/82  138/60  114/60  122/60   Heart Rate (Admit)  68 bpm  58 bpm  62 bpm  72 bpm  74 bpm   Heart Rate (Exercise)  93 bpm  75 bpm  89 bpm  94 bpm  112 bpm   Heart Rate (Exit)  53 bpm  57 bpm  59 bpm  70 bpm  64 bpm   Rating of Perceived Exertion (Exercise)  13  13  14  12  13    Symptoms  none  left hip pain from sciatica  left hip pain from sciatica  -  -   Comments  -  -  -  -  Pt has been out the last week due to bronchitis and sinusitis.   Duration  Progress to 30 minutes of  aerobic without signs/symptoms of physical distress  Progress to 30 minutes of  aerobic without signs/symptoms of physical distress  Progress to 30 minutes of  aerobic without signs/symptoms of physical distress  Progress to 30 minutes of  aerobic without signs/symptoms of physical distress  Progress to 30 minutes of  aerobic without signs/symptoms of physical distress   Intensity  THRR unchanged  THRR unchanged  THRR unchanged  THRR unchanged  THRR unchanged     Progression   Progression  Continue to progress workloads to maintain intensity without signs/symptoms of physical distress.  Continue to progress workloads to maintain intensity without  signs/symptoms of physical distress.  Continue to progress workloads to maintain intensity without signs/symptoms of physical distress.  Continue to progress workloads to maintain intensity without signs/symptoms of physical distress.  Continue to progress workloads to maintain intensity without signs/symptoms of physical distress.   Average METs  2  2.4  2.8  2.9  2.7     Resistance Training   Training Prescription  No Relaxation day, no weights.  Yes  Yes  Yes  Yes   Weight  -  4lbs  4lbs  4lbs  4lbs   Reps  -  10-15  10-15  10-15  10-15   Time  -  10 Minutes  10 Minutes  10 Minutes  10 Minutes     Interval Training   Interval Training  No  No  No  No  No  Recumbant Bike   Level  -  -  -  -  -   Watts  -  -  -  -  -   Minutes  10  -  -  -  -   METs  -  -  -  -  -     NuStep   Level  1  4  5  5  5    SPM  75  85  85  85  85   Minutes  10  10  10  10  10    METs  1.7  2.4  2.6  2.7  2.3     Arm Ergometer   Level  1  1  2  2  3    Minutes  10  10  10  10  10      Track   Laps  8  8  11  12  12    Minutes  10  10  10  10  10    METs  2.39  2.39  2.91  3.09  3.09     Home Exercise Plan   Plans to continue exercise at  -  Home (comment)  Home (comment)  Home (comment)  Home (comment)   Frequency  -  Add 1 additional day to program exercise sessions.  Add 1 additional day to program exercise sessions.  Add 1 additional day to program exercise sessions.  Add 1 additional day to program exercise sessions.   Initial Home Exercises Provided  -  11/09/17  11/09/17  11/09/17  11/09/17      Exercise Comments: Exercise Comments    Row Name 11/04/17 1010 11/09/17 1027 11/16/17 1012 11/30/17 1000 12/14/17 1021   Exercise Comments  Patient is off to a good start with exercise. Reviewed RPE scale with patient.  Reviewed home exercise guidelines, METs and goals with patient.  Reviewed METs with patient.  Reviewed METs and goals with patient.  Reviewed METs with patient.    [place] Name 01/04/18  1000           Exercise Comments  Reviewed METs and goals with patient.           Exercise Goals and Review:   Exercise Goals Re-Evaluation : Exercise Goals Re-Evaluation    Row Name 11/04/17 1000 11/09/17 1027 11/30/17 1000 01/04/18 1000       Exercise Goal Re-Evaluation   Exercise Goals Review  Able to understand and use rate of perceived exertion (RPE) scale;Increase Physical Activity;Increase Strength and Stamina  Able to understand and use rate of perceived exertion (RPE) scale;Knowledge and understanding of Target Heart Rate Range (THRR);Understanding of Exercise Prescription  Increase Strength and Stamina  Increase Physical Activity    Comments  Patient wants to increase stamina and decrease SOB when doing activities. Reviewed RPE scale, and pt understands and is able to use the RPE scale appropriately.  Reviewed home exercise guidelines with patient including THRR, RPE scale, and endpoints for exercise.  Patient states that she was able to walk from valet parking to the cardiac rehab department for the first time. Pt is proud of being able to walk this distance. Pt states that her SOB has gotten "somewhat" better. Discussed patient's exercise plans, and she is doing some walking on her non-cardiac rehab days, but walking is limited by sciatic nerve pain.  Patient has been doing well with exercise, and pt feels she's making progress with her exercise. Pt was out the past week  with bronchitis and sinusitis.    Expected Outcomes  Increase workloads to help achieve goal of increased stamina and decreased SOB with activity.  Pt will add 1-2 days walking in addition to exercise at [facility].  Continue to increase workloads as tolerated to help improve strength and stamina and help decrease the amount of SOB experienced.  Continue exercise routine to help maintain health and fitness improvements.        Discharge Exercise Prescription (Final Exercise Prescription Changes): Exercise Prescription  Changes - 01/04/18 0949      Response to Exercise   Blood Pressure (Admit)  122/72    Blood Pressure (Exercise)  148/86    Blood Pressure (Exit)  122/60    Heart Rate (Admit)  74 bpm    Heart Rate (Exercise)  112 bpm    Heart Rate (Exit)  64 bpm    Rating of Perceived Exertion (Exercise)  13    Comments  Pt has been out the last week due to bronchitis and sinusitis.    Duration  Progress to 30 minutes of  aerobic without signs/symptoms of physical distress    Intensity  THRR unchanged      Progression   Progression  Continue to progress workloads to maintain intensity without signs/symptoms of physical distress.    Average METs  2.7      Resistance Training   Training Prescription  Yes    Weight  4lbs    Reps  10-15    Time  10 Minutes      Interval Training   Interval Training  No      NuStep   Level  5    SPM  85    Minutes  10    METs  2.3      Arm Ergometer   Level  3    Minutes  10      Track   Laps  12    Minutes  10    METs  3.09      Home Exercise Plan   Plans to continue exercise at  Home (comment)    Frequency  Add 1 additional day to program exercise sessions.    Initial Home Exercises Provided  11/09/17       Nutrition:  Target Goals: Understanding of nutrition guidelines, daily intake of sodium 1500mg , cholesterol 200mg , calories 30% from fat and 7% or less from saturated fats, daily to have 5 or more servings of fruits and vegetables.  Biometrics: Pre Biometrics - 10/29/17 1506      Pre Biometrics   Height  5\' 1"  (1.549 m)    Weight  166 lb 7.2 oz (75.5 kg)    Waist Circumference  37 inches    Hip Circumference  43 inches    Waist to Hip Ratio  0.86 %    BMI (Calculated)  31.47    Triceps Skinfold  34 mm    % Body Fat  44.1 %    Grip Strength  24 kg    Flexibility  12 in    Single Leg Stand  15.5 seconds        Nutrition Therapy Plan and Nutrition Goals: Nutrition Therapy & Goals - 11/02/17 1031      Nutrition Therapy   Diet   Heart Healthy      Personal Nutrition Goals   Nutrition Goal  Pt to identify food quantities necessary to achieve weight loss of 6-24 lb at graduation from cardiac rehab.  Intervention Plan   Intervention  Prescribe, educate and counsel regarding individualized specific dietary modifications aiming towards targeted core components such as weight, hypertension, lipid management, diabetes, heart failure and other comorbidities.    Expected Outcomes  Short Term Goal: Understand basic principles of dietary content, such as calories, fat, sodium, cholesterol and nutrients.;Long Term Goal: Adherence to prescribed nutrition plan.       Nutrition Assessments: Nutrition Assessments - 11/02/17 1522      MEDFICTS Scores   Pre Score  26       Nutrition Goals Re-Evaluation:   Nutrition Goals Re-Evaluation:   Nutrition Goals Discharge (Final Nutrition Goals Re-Evaluation):   Psychosocial: Target Goals: Acknowledge presence or absence of significant depression and/or stress, maximize coping skills, provide positive support system. Participant is able to verbalize types and ability to use techniques and skills needed for reducing stress and depression.  Initial Review & Psychosocial Screening: Initial Psych Review & Screening - 10/29/17 1544      Initial Review   Current issues with  None Identified      Family Dynamics   Good Support System?  Yes Jacqueline Burke has her husband and children for support      Barriers   Psychosocial barriers to participate in program  There are no identifiable barriers or psychosocial needs.      Screening Interventions   Interventions  Encouraged to exercise       Quality of Life Scores: Quality of Life - 10/29/17 1512      Quality of Life Scores   Health/Function Pre  30 %    Socioeconomic Pre  30 %    Psych/Spiritual Pre  30 %    Family Pre  30 %    GLOBAL Pre  30 %      Scores of 19 and below usually indicate a poorer quality of life in these  areas.  A difference of  2-3 points is a clinically meaningful difference.  A difference of 2-3 points in the total score of the Quality of Life Index has been associated with significant improvement in overall quality of life, self-image, physical symptoms, and general health in studies assessing change in quality of life.  PHQ-9: Recent Review Flowsheet Data    Depression screen [facility] 2/9 11/04/2017   Decreased Interest 0   Down, Depressed, Hopeless 0   PHQ - 2 Score 0     Interpretation of Total Score  Total Score Depression Severity:  1-4 = Minimal depression, 5-9 = Mild depression, 10-14 = Moderate depression, 15-19 = Moderately severe depression, 20-27 = Severe depression   Psychosocial Evaluation and Intervention:   Psychosocial Re-Evaluation: Psychosocial Re-Evaluation    [place] Name 11/12/17 1118 12/08/17 1100 01/05/18 1545         Psychosocial Re-Evaluation   Current issues with  None Identified  None Identified  None Identified     Interventions  Encouraged to attend Cardiac Rehabilitation for the exercise  Encouraged to attend Cardiac Rehabilitation for the exercise  Encouraged to attend Cardiac Rehabilitation for the exercise     Continue Psychosocial Services   No Follow up required  No Follow up required  No Follow up required        Psychosocial Discharge (Final Psychosocial Re-Evaluation): Psychosocial Re-Evaluation - 01/05/18 1545      Psychosocial Re-Evaluation   Current issues with  None Identified    Interventions  Encouraged to attend Cardiac Rehabilitation for the exercise    Continue Psychosocial Services   No  Follow up required       Vocational Rehabilitation: Provide vocational rehab assistance to qualifying candidates.   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 Participant will state understanding/return demonstration of topics presented.  Learning Barriers/Preferences: Learning Barriers/Preferences - 10/29/17 1508      Learning Barriers/Preferences   Learning Barriers  Sight    Learning Preferences  Skilled Demonstration;Individual Instruction;Verbal Instruction;Written Material;Video       Education Topics: Count Your Pulse:  -Group instruction provided by verbal instruction, demonstration, patient participation and written materials to support subject.  Instructors address importance of being able to find your pulse and how to count your pulse when at home without a heart monitor.  Patients get hands on experience counting their pulse with staff help and individually.   Heart Attack, Angina, and Risk Factor Modification:  -Group instruction provided by verbal instruction, video, and written materials to support subject.  Instructors address signs and symptoms of angina and heart attacks.    Also discuss risk factors for heart disease and how to make changes to improve heart health risk factors.   CARDIAC REHAB PHASE II EXERCISE from 12/23/2017 in [place]  Date  11/18/17  Instruction Review Code  2- Demonstrated Understanding      Functional Fitness:  -Group instruction provided by verbal instruction, demonstration, patient participation, and written materials to support subject.  Instructors address safety measures for doing things around the house.  Discuss how to get up and down off the floor, how to pick things up properly, how to safely get out of a chair without assistance, and balance training.   CARDIAC REHAB PHASE II EXERCISE from 12/23/2017 in [place]  Date  12/11/17  Instruction Review Code  2- Demonstrated  Understanding      Meditation and Mindfulness:  -Group instruction provided by verbal instruction, patient participation, and written materials to support subject.  Instructor addresses importance of mindfulness and meditation practice to help reduce stress and improve awareness.  Instructor also leads participants through a meditation exercise.    CARDIAC REHAB PHASE II EXERCISE from 12/23/2017 in [place]  Date  12/16/17  Instruction Review Code  2- Demonstrated Understanding      Stretching for Flexibility and Mobility:  -Group instruction provided by verbal instruction, patient participation, and written materials to support subject.  Instructors lead participants through series of stretches that are designed to increase flexibility thus improving mobility.  These stretches are additional exercise for major muscle groups that are typically performed during regular warm up and cool down.   Hands Only CPR:  -Group verbal, video, and participation provides a basic overview of AHA guidelines for community CPR. Role-play of emergencies allow participants the opportunity to practice calling for help and chest compression technique with discussion of AED use.   Hypertension: -Group verbal and written instruction that provides a basic overview of hypertension including the most recent diagnostic guidelines, risk factor reduction with self-care instructions and medication management.   CARDIAC REHAB PHASE II EXERCISE from 12/23/2017 in [place]  Date  12/18/17  Instruction Review Code  2- Demonstrated Understanding       Nutrition I class: Heart Healthy Eating:  -Group instruction provided by PowerPoint slides, verbal discussion, and written materials to support subject matter. The instructor gives an explanation and review of the Therapeutic Lifestyle Changes diet recommendations, which includes a discussion on lipid goals,  dietary fat, sodium, fiber, plant stanol/sterol esters, sugar, and the components of a well-balanced, healthy diet.   CARDIAC REHAB PHASE II EXERCISE from 12/23/2017 in [place]  Date  10/29/17  Educator  RD      Nutrition II class: Lifestyle Skills:  -Group instruction provided by PowerPoint slides, verbal discussion, and written materials to support subject matter. The instructor gives an explanation and review of label reading, grocery shopping for heart health, heart healthy recipe modifications, and ways to make healthier choices when eating out.   CARDIAC REHAB PHASE II EXERCISE from 12/23/2017 in [place]  Date  10/29/17  Educator  RD      Diabetes Question & Answer:  -Group instruction provided by PowerPoint slides, verbal discussion, and written materials to support subject matter. The instructor gives an explanation and review of diabetes co-morbidities, pre- and post-prandial blood glucose goals, pre-exercise blood glucose goals, signs, symptoms, and treatment of hypoglycemia and hyperglycemia, and foot care basics.   CARDIAC REHAB PHASE II EXERCISE from 12/23/2017 in [place]  Date  11/25/17  Educator  RD  Instruction Review Code  2- Demonstrated Understanding      Diabetes Blitz:  -Group instruction provided by PowerPoint slides, verbal discussion, and written materials to support subject matter. The instructor gives an explanation and review of the physiology behind type 1 and type 2 diabetes, diabetes medications and rational behind using different medications, pre- and post-prandial blood glucose recommendations and Hemoglobin A1c goals, diabetes diet, and exercise including blood glucose guidelines for exercising safely.    Portion Distortion:  -Group instruction provided by PowerPoint slides, verbal discussion, written materials, and food models to support subject matter.  The instructor gives an explanation of serving size versus portion size, changes in portions sizes over the last 20 years, and what consists of a serving from each food group.   Stress Management:  -Group instruction provided by verbal instruction, video, and written materials to support subject matter.  Instructors review role of stress in heart disease and how to cope with stress positively.     CARDIAC REHAB PHASE II EXERCISE from 12/23/2017 in [place]  Date  12/23/17  Educator  RN  Instruction Review Code  2- Demonstrated Understanding      Exercising on Your Own:  -Group instruction provided by verbal instruction, power point, and written materials to support subject.  Instructors discuss benefits of exercise, components of exercise, frequency and intensity of exercise, and end points for exercise.  Also discuss use of nitroglycerin and activating EMS.  Review options of places to exercise outside of rehab.  Review guidelines for sex with heart disease.   CARDIAC REHAB PHASE II EXERCISE from 12/23/2017 in [place]  Date  11/20/17  Instruction Review Code  2- Demonstrated Understanding      Cardiac Drugs I:  -Group instruction provided by verbal instruction and written materials to support subject.  Instructor reviews cardiac drug classes: antiplatelets, anticoagulants, beta blockers, and statins.  Instructor discusses reasons, side effects, and lifestyle considerations for each  drug class.   CARDIAC REHAB PHASE II EXERCISE from 12/23/2017 in [place]  Date  12/09/17  Instruction Review Code  2- Demonstrated Understanding      Cardiac Drugs II:  -Group instruction provided by verbal instruction and written materials to support subject.  Instructor reviews cardiac drug classes: angiotensin converting enzyme inhibitors (ACE-I), angiotensin II receptor blockers (ARBs), nitrates, and  calcium channel blockers.  Instructor discusses reasons, side effects, and lifestyle considerations for each drug class.   CARDIAC REHAB PHASE II EXERCISE from 12/23/2017 in [place]  Date  11/11/17  Instruction Review Code  2- Demonstrated Understanding      Anatomy and Physiology of the Circulatory System:  Group verbal and written instruction and models provide basic cardiac anatomy and physiology, with the coronary electrical and arterial systems. Review of: AMI, Angina, Valve disease, Heart Failure, Peripheral Artery Disease, Cardiac Arrhythmia, Pacemakers, and the ICD.   Other Education:  -Group or individual verbal, written, or video instructions that support the educational goals of the cardiac rehab program.   Holiday Eating Survival Tips:  -Group instruction provided by PowerPoint slides, verbal discussion, and written materials to support subject matter. The instructor gives patients tips, tricks, and techniques to help them not only survive but enjoy the holidays despite the onslaught of food that accompanies the holidays.   Knowledge Questionnaire Score: Knowledge Questionnaire Score - 10/29/17 1510      Knowledge Questionnaire Score   Pre Score  16/24       Core Components/Risk Factors/Patient Goals at Admission: Personal Goals and Risk Factors at Admission - 10/29/17 1452      Core Components/Risk Factors/Patient Goals on Admission    Weight Management  Yes;Weight Loss    Intervention  Weight Management: Develop a combined nutrition and exercise program designed to reach desired caloric intake, while maintaining appropriate intake of nutrient and fiber, sodium and fats, and appropriate energy expenditure required for the weight goal.;Weight Management: Provide education and appropriate resources to help participant work on and attain dietary goals.;Weight Management/Obesity: Establish reasonable short term and long term weight goals.     Admit Weight  166 lb 7.2 oz (75.5 kg)    Goal Weight: Short Term  163 lb (73.9 kg)    Goal Weight: Long Term  160 lb (72.6 kg)    Expected Outcomes  Short Term: Continue to assess and modify interventions until short term weight is achieved;Long Term: Adherence to nutrition and physical activity/exercise program aimed toward attainment of established weight goal;Weight Maintenance: Understanding of the daily nutrition guidelines, which includes 25-35% calories from fat, 7% or less cal from saturated fats, less than 200mg  cholesterol, less than 1.5gm of sodium, & 5 or more servings of fruits and vegetables daily;Weight Loss: Understanding of general recommendations for a balanced deficit meal plan, which promotes 1-2 lb weight loss per week and includes a negative energy balance of [phone] kcal/d;Understanding recommendations for meals to include 15-35% energy as protein, 25-35% energy from fat, 35-60% energy from carbohydrates, less than 200mg  of dietary cholesterol, 20-35 gm of total fiber daily;Understanding of distribution of calorie intake throughout the day with the consumption of 4-5 meals/snacks    Hypertension  Yes    Intervention  Monitor prescription use compliance.;Provide education on lifestyle modifcations including regular physical activity/exercise, weight management, moderate sodium restriction and increased consumption of fresh fruit, vegetables, and low fat dairy, alcohol moderation, and smoking cessation.    Expected Outcomes  Short  Term: Continued assessment and intervention until BP is < 140/66mm HG in hypertensive participants. < 130/69mm HG in hypertensive participants with diabetes, heart failure or chronic kidney disease.;Long Term: Maintenance of blood pressure at goal levels.    Lipids  Yes    Intervention  Provide education and support for participant on nutrition & aerobic/resistive exercise along with prescribed medications to achieve LDL 70mg , HDL >40mg .    Expected Outcomes   Short Term: Participant states understanding of desired cholesterol values and is compliant with medications prescribed. Participant is following exercise prescription and nutrition guidelines.;Long Term: Cholesterol controlled with medications as prescribed, with individualized exercise RX and with personalized nutrition plan. Value goals: LDL < 70mg , HDL > 40 mg.       Core Components/Risk Factors/Patient Goals Review:  Goals and Risk Factor Review    Row Name 11/12/17 1115 12/08/17 1058 01/05/18 1545         Core Components/Risk Factors/Patient Goals Review   Personal Goals Review  Weight Management/Obesity;Lipids  Weight Management/Obesity;Lipids  Weight Management/Obesity;Lipids     Review  Jacqueline Burke is off to a good start to exercise will continue to monitor blood pressures as Jacqueline Burke has had some moderate variations.  Jacqueline Burke's vital signs have been stable at cardiac rehab. Jacqueline Burke is doing well with exercise and rest as needed. Jacqueline Burke's weights have been stable.  Jacqueline Burke's vital signs have been stable at cardiac rehab. Jacqueline Burke is doing well with exercise and rest as needed. Jacqueline Burke's weights have been stable.     Expected Outcomes  Jacqueline Burke will continue to take her medications as presribed and participate in phase 2 cardiac rehab.  Jacqueline Burke will continue to take her medications as presribed and participate in phase 2 cardiac rehab.  Jacqueline Burke will continue to take her medications as presribed and participate in phase 2 cardiac rehab.        Core Components/Risk Factors/Patient Goals at Discharge (Final Review):  Goals and Risk Factor Review - 01/05/18 1545      Core Components/Risk Factors/Patient Goals Review   Personal Goals Review  Weight Management/Obesity;Lipids    Review  Jacqueline Burke's vital signs have been stable at cardiac rehab. Jacqueline Burke is doing well with exercise and rest as needed. Jacqueline Burke's weights have been stable.    Expected Outcomes  Jacqueline Burke will continue to take her medications as presribed and participate in phase 2 cardiac rehab.        ITP Comments: ITP Comments    Row Name 10/29/17 1402 11/12/17 1112 12/08/17 1057 01/05/18 1545     ITP Comments  Dr. Fransico Him, Medical Director  30 Day ITP review. Patient is off to a good start to exercise despite dealing with chronic sciatica and hip pain  30 Day ITP review. Patient has good attendance and participation in phase 2 cardiac rehab  30 Day ITP review. Patient has good attendance and participation in phase 2 cardiac rehab       Comments: See ITP comments. Jacqueline Burke returned to exercise this week after being out with bronchitis last week.Barnet Pall, RN,BSN 01/05/2018 3:50 PM

## 2018-01-06 ENCOUNTER — Encounter (HOSPITAL_COMMUNITY)
Admission: RE | Admit: 2018-01-06 | Discharge: 2018-01-06 | Disposition: A | Discharge status: Home / Self Care | Payer: PPO | Source: Ambulatory Visit | Attending: Cardiology | Admitting: Cardiology

## 2018-01-06 ENCOUNTER — Encounter (HOSPITAL_COMMUNITY): Payer: PPO

## 2018-01-06 DIAGNOSIS — I214 Non-ST elevation (NSTEMI) myocardial infarction: Secondary | ICD-10-CM

## 2018-01-06 DIAGNOSIS — Z955 Presence of coronary angioplasty implant and graft: Secondary | ICD-10-CM

## 2018-01-08 ENCOUNTER — Encounter (HOSPITAL_COMMUNITY): Payer: PPO

## 2018-01-08 ENCOUNTER — Encounter (HOSPITAL_COMMUNITY)
Admission: RE | Admit: 2018-01-08 | Discharge: 2018-01-08 | Disposition: A | Discharge status: Home / Self Care | Payer: PPO | Source: Ambulatory Visit | Attending: Cardiology | Admitting: Cardiology

## 2018-01-08 DIAGNOSIS — I214 Non-ST elevation (NSTEMI) myocardial infarction: Secondary | ICD-10-CM

## 2018-01-08 DIAGNOSIS — Z955 Presence of coronary angioplasty implant and graft: Secondary | ICD-10-CM

## 2018-01-11 ENCOUNTER — Encounter (HOSPITAL_COMMUNITY)
Admission: RE | Admit: 2018-01-11 | Discharge: 2018-01-11 | Disposition: A | Discharge status: Home / Self Care | Payer: PPO | Source: Ambulatory Visit | Attending: Cardiology | Admitting: Cardiology

## 2018-01-11 ENCOUNTER — Encounter (HOSPITAL_COMMUNITY): Payer: PPO

## 2018-01-11 DIAGNOSIS — Z955 Presence of coronary angioplasty implant and graft: Secondary | ICD-10-CM

## 2018-01-11 DIAGNOSIS — I214 Non-ST elevation (NSTEMI) myocardial infarction: Secondary | ICD-10-CM

## 2018-01-13 ENCOUNTER — Telehealth: Payer: Self-pay | Admitting: *Deleted

## 2018-01-13 ENCOUNTER — Encounter (HOSPITAL_COMMUNITY)
Admission: RE | Admit: 2018-01-13 | Discharge: 2018-01-13 | Disposition: A | Discharge status: Home / Self Care | Payer: PPO | Source: Ambulatory Visit | Attending: Cardiology | Admitting: Cardiology

## 2018-01-13 ENCOUNTER — Encounter (HOSPITAL_COMMUNITY): Payer: PPO

## 2018-01-13 DIAGNOSIS — I214 Non-ST elevation (NSTEMI) myocardial infarction: Secondary | ICD-10-CM

## 2018-01-13 DIAGNOSIS — Z955 Presence of coronary angioplasty implant and graft: Secondary | ICD-10-CM | POA: Diagnosis not present

## 2018-01-13 NOTE — Telephone Encounter (Signed)
Patient called and stated that per her pharmacy, she is in the donut hole. She needs to refill the brilinta as she only has two days supply on hand but she states that she will not be able to afford it and would like to know if there are other options. I went over the patient assistance process with her and she would like to proceed. She is aware that I will place samples along with an application at the front desk. She verbalized her understanding and appreciation.

## 2018-01-15 ENCOUNTER — Encounter (HOSPITAL_COMMUNITY): Payer: PPO

## 2018-01-15 ENCOUNTER — Encounter (HOSPITAL_COMMUNITY)
Admission: RE | Admit: 2018-01-15 | Discharge: 2018-01-15 | Disposition: A | Discharge status: Home / Self Care | Payer: PPO | Source: Ambulatory Visit | Attending: Cardiology | Admitting: Cardiology

## 2018-01-15 DIAGNOSIS — I214 Non-ST elevation (NSTEMI) myocardial infarction: Secondary | ICD-10-CM

## 2018-01-15 DIAGNOSIS — Z955 Presence of coronary angioplasty implant and graft: Secondary | ICD-10-CM

## 2018-01-18 ENCOUNTER — Encounter (HOSPITAL_COMMUNITY)
Admission: RE | Admit: 2018-01-18 | Discharge: 2018-01-18 | Disposition: A | Discharge status: Home / Self Care | Payer: PPO | Source: Ambulatory Visit | Attending: Cardiology | Admitting: Cardiology

## 2018-01-18 ENCOUNTER — Encounter (HOSPITAL_COMMUNITY): Payer: PPO

## 2018-01-18 DIAGNOSIS — Z955 Presence of coronary angioplasty implant and graft: Secondary | ICD-10-CM | POA: Diagnosis not present

## 2018-01-18 DIAGNOSIS — I214 Non-ST elevation (NSTEMI) myocardial infarction: Secondary | ICD-10-CM

## 2018-01-20 ENCOUNTER — Encounter (HOSPITAL_COMMUNITY)
Admission: RE | Admit: 2018-01-20 | Discharge: 2018-01-20 | Disposition: A | Discharge status: Home / Self Care | Payer: PPO | Source: Ambulatory Visit | Attending: Cardiology | Admitting: Cardiology

## 2018-01-20 ENCOUNTER — Encounter (HOSPITAL_COMMUNITY): Payer: PPO

## 2018-01-20 DIAGNOSIS — Z955 Presence of coronary angioplasty implant and graft: Secondary | ICD-10-CM | POA: Diagnosis not present

## 2018-01-20 DIAGNOSIS — I214 Non-ST elevation (NSTEMI) myocardial infarction: Secondary | ICD-10-CM

## 2018-01-22 ENCOUNTER — Encounter (HOSPITAL_COMMUNITY): Payer: PPO

## 2018-01-22 ENCOUNTER — Encounter (HOSPITAL_COMMUNITY)
Admission: RE | Admit: 2018-01-22 | Discharge: 2018-01-22 | Disposition: A | Discharge status: Home / Self Care | Payer: PPO | Source: Ambulatory Visit | Attending: Cardiology | Admitting: Cardiology

## 2018-01-22 ENCOUNTER — Telehealth: Payer: Self-pay

## 2018-01-22 DIAGNOSIS — I214 Non-ST elevation (NSTEMI) myocardial infarction: Secondary | ICD-10-CM

## 2018-01-22 DIAGNOSIS — Z955 Presence of coronary angioplasty implant and graft: Secondary | ICD-10-CM | POA: Diagnosis not present

## 2018-01-22 NOTE — Telephone Encounter (Signed)
The pt returned her completed [place] and [place] pt asst application along with her 2018 proof of income and her 2019 out of pocket expense report to the office today. I have completed the provider part of the application and placed it in Dr. Montez Morita mail bin awaiting her signature.

## 2018-01-25 ENCOUNTER — Encounter (HOSPITAL_COMMUNITY): Payer: PPO

## 2018-01-25 ENCOUNTER — Encounter (HOSPITAL_COMMUNITY)
Admission: RE | Admit: 2018-01-25 | Discharge: 2018-01-25 | Disposition: A | Discharge status: Home / Self Care | Payer: PPO | Source: Ambulatory Visit | Attending: Cardiology | Admitting: Cardiology

## 2018-01-25 DIAGNOSIS — Z955 Presence of coronary angioplasty implant and graft: Secondary | ICD-10-CM

## 2018-01-25 DIAGNOSIS — I214 Non-ST elevation (NSTEMI) myocardial infarction: Secondary | ICD-10-CM

## 2018-01-27 ENCOUNTER — Encounter (HOSPITAL_COMMUNITY)
Admission: RE | Admit: 2018-01-27 | Discharge: 2018-01-27 | Disposition: A | Discharge status: Home / Self Care | Payer: PPO | Source: Ambulatory Visit | Attending: Cardiology | Admitting: Cardiology

## 2018-01-27 ENCOUNTER — Encounter (HOSPITAL_COMMUNITY): Payer: PPO

## 2018-01-27 VITALS — BP 130/62 | HR 96 | Ht 61.0 in | Wt 161.4 lb

## 2018-01-27 DIAGNOSIS — Z955 Presence of coronary angioplasty implant and graft: Secondary | ICD-10-CM | POA: Diagnosis not present

## 2018-01-27 DIAGNOSIS — I214 Non-ST elevation (NSTEMI) myocardial infarction: Secondary | ICD-10-CM

## 2018-01-27 NOTE — Telephone Encounter (Signed)
**Note De-Identified  Obfuscation** Dr Radford Pax has signed the pts [place] and [place] pt asst application and I have faxed all to [place] and [place].

## 2018-01-28 ENCOUNTER — Other Ambulatory Visit: Payer: Self-pay | Admitting: *Deleted

## 2018-01-28 NOTE — Patient Outreach (Signed)
[facility]) Care Management  01/28/2018  MAIZIE GARNO 1935-07-22 [id number]  Referral via [place]; Reason: patient needs medication assistance; in coverage gap.  Telephone call to patient who was advised of reason for call. HIPPA verification received. Patient voices that she can "only talk briefly because on the way out of town due to death in family."   Patient voices that she had heart attack in January and has just completed cardiac rehab classes.   Patient voices that major concern is that she is in coverage gap for medication and will not be able to afford medication for extended period of time. States cost of Brilinta 90mg  twice daily is $93 per month.  States she usually pays $42 per month.   States she realized when she had refill on Brilinta,  she was in coverage gap. States she did not need to get refill on other medications at that time but states she will get refill next week.   States she needs assistance with getting medications if possible. Patient consents to referral to [facility] Pharmacist.  Plan: Refer to Pharmacist for medication assistance.  Sherrin Daisy, RN BSN [place] Management Coordinator [facility] Care Management  [phone]

## 2018-01-29 ENCOUNTER — Encounter (HOSPITAL_COMMUNITY): Payer: PPO

## 2018-02-03 ENCOUNTER — Encounter (HOSPITAL_COMMUNITY): Payer: PPO

## 2018-02-03 ENCOUNTER — Ambulatory Visit: Payer: Self-pay | Admitting: Pharmacist

## 2018-02-04 ENCOUNTER — Ambulatory Visit: Payer: Self-pay | Admitting: Pharmacist

## 2018-02-05 ENCOUNTER — Other Ambulatory Visit: Payer: Self-pay | Admitting: Pharmacist

## 2018-02-05 NOTE — Patient Outreach (Signed)
[facility]) Care Management  02/05/2018  Jacqueline Burke Nov 18, 1935 [id number]  82 year old female referred to [place] [facility] via [place] for medication assistance with Brilinta.  PMH significant for: CAD, NSTEMI, HLD.  SUBJECTIVE: Successful patient outreach call to Jacqueline Burke with HIPAA identifiers verified x3.  Patient agreeable to reviewed medications telephonically.  She reports 100% compliance and is very aware of her medications.  She reports that she is now in the coverage gap and her Brilinta RX has double in price to ~$100/month.  Of note, patient has been denied from [employer], but thinks she has now met her TROOP (3%) and wants to try for patient assistance again.  Medications Reviewed Today    Reviewed by Lavera Guise, [facility] (Pharmacist) on 02/05/18 at 1444  Med List Status: <None>  Medication Order Taking? Sig Documenting Provider Last Dose Status Informant  acetaminophen (TYLENOL) 500 MG tablet [id number]  Take 1,000 mg by mouth every 8 (eight) hours as needed for moderate pain or headache. [provider]  Active Self           Med Note Karle Starch, Glorious Peach Jan 27, 2018  9:41 AM) Dewaine Conger twice a day  amitriptyline (ELAVIL) 10 MG tablet [id number] Yes Take 20 mg by mouth at bedtime.  [provider] Taking Active Self  amLODipine (NORVASC) 5 MG tablet [id number] Yes Take 1.5 tablets (7.5 mg total) by mouth daily. Sueanne Margarita, MD Taking Active   Ascorbic Acid (VITAMIN C PO) [id number] Yes Take 1,000 mg by mouth daily.  [provider] Taking Active Self  aspirin EC 81 MG tablet [id number] Yes Take 81 mg by mouth daily. On hold for procedure [provider] Taking Active Self  atorvastatin (LIPITOR) 80 MG tablet [id number] Yes Take 1 tablet (80 mg total) by mouth daily at 6 PM. Alma Friendly, MD Taking Active Self  cetirizine (ZYRTEC) 10 MG chewable tablet [id number] Yes Chew 10 mg by mouth daily. [provider] Taking Active Self  Cholecalciferol (VITAMIN D PO) [id number] Yes Take 3,000 Units by mouth daily.  [provider] Taking Active Self  docusate sodium (COLACE) 100 MG capsule [id number] Yes Take 200 mg by mouth daily.  [provider] Taking Active Self  FIBER PO [id number] Yes Take 2 tablets by mouth daily. [provider] Taking Active Self  fluticasone (FLONASE) 50 MCG/ACT nasal spray [id number] Yes Place 2 sprays into both nostrils daily. Collene Gobble, MD Taking Active Self  gabapentin (NEURONTIN) 100 MG capsule [id number] Yes Take 100 mg by mouth 2 (two) times daily.  [provider] Taking Active Self  metoprolol tartrate (LOPRESSOR) 25 MG tablet [id number] Yes Take 1 tablet (25 mg total) by mouth 2 (two) times daily. Isaiah Serge, NP Taking Active   ticagrelor (BRILINTA) 90 MG TABS tablet [id number] Yes Take 1 tablet (90 mg total) by mouth 2 (two) times daily. Isaiah Serge, NP Taking Active Self  trospium (SANCTURA) 20 MG tablet [id number] Yes Take 60 mg by mouth daily. [provider] Taking Active Self        ASSESSMENT: Drugs sorted by system:  Neurologic/Psychologic: amitryptyline, gabapentin Cardiovascular: apirin, ticagrelor, atorvastatin, amlodipine, metoprolol tartrate Pulmonary/Allergy: ceitirizine, flonase nasal Gastrointestinal: fiber, docusate Pain: APAP PRN Vitamins/Minerals: vit C, vit D Miscellaneous: tropsium (SANCTURA)  Medications to avoid in the elderly:  amitryptyline (patient on low dose and denies having any issues Duplications in therapy:  -tolterodine has been  removed and replaced with tropsium (SANTUCRA) -tamsulosin (FLOMAX) has been discontinued  **MEDICATION LIST IN [facility] HAS BEEN UPDATED TO REFLECT THE ABOVE **   Medication assistance:  Patient unable to afford the following medication  1.[place] programs reviewed with patient.  -Extra Help:patient does not qualify for  LIS/Extra Help based on reported income -PAPs:       -Financial trader and requires 3% TROOP.  Patient believes she has met this requirement  Plan: -I will route PAP letter to [facility], Etter Sjogren who will assist with contacting provider & patient to obtain necessary application requirements -I will follow up with patient in 1-2 weeks to ensure PAP applications have arrived   Regina Eck, PharmD, [place]  [phone]

## 2018-02-08 ENCOUNTER — Ambulatory Visit: Payer: Self-pay | Admitting: Pharmacist

## 2018-02-08 ENCOUNTER — Other Ambulatory Visit: Payer: Self-pay | Admitting: Pharmacy Technician

## 2018-02-08 NOTE — Patient Outreach (Signed)
[place] [facility]) Care Management  02/08/2018  Jacqueline Burke 1936-05-24 [id number]   Received [place] patient assistance referral from [place] for [place]. Prepared patient portion to be mailed and faxed provider portion to Dr. Radford Pax.   Will follow up with patient in 7-10 business days to confirm application has been received.  Maud Deed [place], [place] Management [phone]

## 2018-02-19 ENCOUNTER — Other Ambulatory Visit: Payer: Self-pay | Admitting: Pharmacist

## 2018-02-22 ENCOUNTER — Ambulatory Visit: Payer: Self-pay | Admitting: Pharmacist

## 2018-02-22 ENCOUNTER — Encounter: Payer: Self-pay | Admitting: Pharmacy Technician

## 2018-02-22 ENCOUNTER — Other Ambulatory Visit: Payer: Self-pay | Admitting: Pharmacist

## 2018-02-22 NOTE — Patient Outreach (Signed)
[facility]) Care Management  02/22/2018  Jacqueline Burke 08/16/1935 [id number]   Incoming call from patient with HIPAA identifiers verified.  Patient was returning [facility] Pharmacist call from Friday 02/19/18.  Patient was confused about missing information for PAP (Brilinta).  Instructed patient to include financial information as discussed.  Based on reported joint income, patient should not be excluded or denied based on financials.  Instructed patient that return envelop will be mailed to her home within 7 business days (HTA envelop)  PLAN: -I will f/u with patient within 7 business days to ensure envelop received and returned  Regina Eck, PharmD, [place]  [phone]

## 2018-02-24 ENCOUNTER — Ambulatory Visit: Payer: PPO | Admitting: Cardiology

## 2018-02-24 ENCOUNTER — Encounter: Payer: Self-pay | Admitting: Cardiology

## 2018-02-24 ENCOUNTER — Other Ambulatory Visit: Payer: Self-pay | Admitting: Family Medicine

## 2018-02-24 VITALS — BP 118/72 | HR 58 | Ht 61.0 in | Wt 159.4 lb

## 2018-02-24 DIAGNOSIS — N183 Chronic kidney disease, stage 3 (moderate): Secondary | ICD-10-CM | POA: Diagnosis not present

## 2018-02-24 DIAGNOSIS — Z955 Presence of coronary angioplasty implant and graft: Secondary | ICD-10-CM | POA: Diagnosis not present

## 2018-02-24 DIAGNOSIS — M810 Age-related osteoporosis without current pathological fracture: Secondary | ICD-10-CM | POA: Diagnosis not present

## 2018-02-24 DIAGNOSIS — E785 Hyperlipidemia, unspecified: Secondary | ICD-10-CM

## 2018-02-24 DIAGNOSIS — I119 Hypertensive heart disease without heart failure: Secondary | ICD-10-CM | POA: Diagnosis not present

## 2018-02-24 DIAGNOSIS — E7800 Pure hypercholesterolemia, unspecified: Secondary | ICD-10-CM | POA: Diagnosis not present

## 2018-02-24 DIAGNOSIS — M154 Erosive (osteo)arthritis: Secondary | ICD-10-CM | POA: Diagnosis not present

## 2018-02-24 DIAGNOSIS — Z1231 Encounter for screening mammogram for malignant neoplasm of breast: Secondary | ICD-10-CM

## 2018-02-24 DIAGNOSIS — I700 Atherosclerosis of aorta: Secondary | ICD-10-CM | POA: Diagnosis not present

## 2018-02-24 DIAGNOSIS — R0602 Shortness of breath: Secondary | ICD-10-CM | POA: Diagnosis not present

## 2018-02-24 DIAGNOSIS — I10 Essential (primary) hypertension: Secondary | ICD-10-CM | POA: Diagnosis not present

## 2018-02-24 DIAGNOSIS — I2510 Atherosclerotic heart disease of native coronary artery without angina pectoris: Secondary | ICD-10-CM | POA: Diagnosis not present

## 2018-02-24 MED ORDER — CLOPIDOGREL BISULFATE 75 MG PO TABS: 75.0000 mg | ORAL_TABLET | Freq: Every day | ORAL | 3 refills | Status: DC

## 2018-02-24 NOTE — Patient Instructions (Signed)
Medication Instructions:  Start: Plavix 75 mg, daily by mouth  Stop: Brilinta  Labwork:  Today: CBC, TSH and ProBNP  Follow-Up: Your physician recommends that you schedule a follow-up appointment in: 4 weeks with Ermalinda Barrios, PA  Your physician wants you to follow-up in: 1 year with Dr. Radford Pax. You will receive a reminder letter in the mail two months in advance. If you don't receive a letter, please call our office to schedule the follow-up appointment.  If you need a refill on your cardiac medications before your next appointment, please call your pharmacy.

## 2018-02-24 NOTE — Addendum Note (Signed)
Encounter addended by: Magda Kiel, RN on: 02/24/2018 5:16 PM  Actions taken: Sign clinical note, Episode resolved

## 2018-02-24 NOTE — Progress Notes (Signed)
Discharge Progress Report  Patient Details  Name: Jacqueline Burke MRN: [id number] Date of Birth: 1936/01/10 Referring Provider:     [facility] from 10/29/2017 in [place]  Referring Provider  Jacqueline Margarita MD        Number of Visits: 35  Reason for Discharge:  Patient independent in their exercise. Patient has met program and personal goals.  Smoking History:  Social History   Tobacco Use  Smoking Status Former Smoker  . Packs/day: 1.00  . Years: 25.00  . Pack years: 25.00  . Types: Cigarettes  . Last attempt to quit: 06/02/1993  . Years since quitting: 24.7  Smokeless Tobacco Never Used    Diagnosis:  NSTEMI (non-ST elevated myocardial infarction) ([place]) 06/11/17  Status post coronary artery stent placement 06/15/17  ADL UCSD:   Initial Exercise Prescription: Initial Exercise Prescription - 10/29/17 1500      Date of Initial Exercise RX and Referring Provider   Date  10/29/17    Referring Provider  Jacqueline Margarita MD       Recumbant Bike   Level  1.5    Watts  5    Minutes  10    METs  2      NuStep   Level  1    SPM  75    Minutes  10    METs  1.5      Track   Laps  8    Minutes  10    METs  2.38      Prescription Details   Frequency (times per week)  3x    Duration  Progress to 30 minutes of continuous aerobic without signs/symptoms of physical distress      Intensity   THRR 40-80% of Max Heartrate  56-113    Ratings of Perceived Exertion  11-13    Perceived Dyspnea  0-4      Progression   Progression  Continue progressive overload as per policy without signs/symptoms or physical distress.      Resistance Training   Training Prescription  Yes    Weight  2lbs    Reps  10-15       Discharge Exercise Prescription (Final Exercise Prescription Changes): Exercise Prescription Changes - 01/27/18 0948      Response to Exercise   Blood Pressure (Admit)  130/62    Blood Pressure  (Exercise)  148/70    Blood Pressure (Exit)  138/86    Heart Rate (Admit)  96 bpm    Heart Rate (Exercise)  116 bpm    Heart Rate (Exit)  96 bpm    Rating of Perceived Exertion (Exercise)  12    Duration  Continue with 30 min of aerobic exercise without signs/symptoms of physical distress.    Intensity  THRR unchanged      Progression   Progression  Continue to progress workloads to maintain intensity without signs/symptoms of physical distress.    Average METs  3      Resistance Training   Training Prescription  No   Relaxation day, no weights.   Weight  --    Reps  --    Time  --      Interval Training   Interval Training  No      NuStep   Level  5    SPM  85    Minutes  10    METs  2.8  Arm Ergometer   Level  4    Minutes  10      Track   Laps  13    Minutes  10    METs  3.26      Home Exercise Plan   Plans to continue exercise at  Home (comment)    Frequency  Add 1 additional day to program exercise sessions.    Initial Home Exercises Provided  11/09/17       Functional Capacity: 6 Minute Walk    Row Name 10/29/17 1454 01/18/18 1019       6 Minute Walk   Phase  Initial  Discharge    Distance  1239 feet  1439 feet    Distance % Change  -  16.14 %    Walk Time  6 minutes  6 minutes    # of Rest Breaks  0  0    MPH  2.35  2.73    METS  1.99  2.48    RPE  13  15    Perceived Dyspnea   2  -    VO2 Peak  6.96  8.68    Symptoms  Yes (comment)  Yes (comment)    Comments  Sciatic Nerve Pain 6/10, SOB +2  Left hip pain "8-9/10" on the pain scale.    Resting HR  61 bpm  58 bpm    Resting BP  142/82  118/64    Resting Oxygen Saturation   96 %  -    Exercise Oxygen Saturation  during 6 min walk  97 %  -    Max Ex. HR  88 bpm  86 bpm    Max Ex. BP  148/60  170/72    2 Minute Post BP  110/68  124/70       Psychological, QOL, Others - Outcomes: PHQ 2/9: Depression screen [facility] 2/9 01/28/2018 01/27/2018 11/04/2017  Decreased Interest 0 0 0  Down,  Depressed, Hopeless 0 0 0  PHQ - 2 Score 0 0 0    Quality of Life: Quality of Life - 01/27/18 1129      Quality of Life   Select  Quality of Life      Quality of Life Scores   Health/Function Pre  30 %    Health/Function Post  29.04 %    Health/Function % Change  -3.2 %    Socioeconomic Pre  30 %    Socioeconomic Post  30 %    Socioeconomic % Change   0 %    Psych/Spiritual Pre  30 %    Psych/Spiritual Post  30 %    Psych/Spiritual % Change  0 %    Family Pre  30 %    Family Post  30 %    Family % Change  0 %    GLOBAL Pre  30 %    GLOBAL Post  29.59 %    GLOBAL % Change  -1.37 %       Personal Goals: Goals established at orientation with interventions provided to work toward goal. Personal Goals and Risk Factors at Admission - 10/29/17 1452      Core Components/Risk Factors/Patient Goals on Admission    Weight Management  Yes;Weight Loss    Intervention  Weight Management: Develop a combined nutrition and exercise program designed to reach desired caloric intake, while maintaining appropriate intake of nutrient and fiber, sodium and fats, and appropriate energy expenditure required for the weight goal.;Weight  Management: Provide education and appropriate resources to help participant work on and attain dietary goals.;Weight Management/Obesity: Establish reasonable short term and long term weight goals.    Admit Weight  166 lb 7.2 oz (75.5 kg)    Goal Weight: Short Term  163 lb (73.9 kg)    Goal Weight: Long Term  160 lb (72.6 kg)    Expected Outcomes  Short Term: Continue to assess and modify interventions until short term weight is achieved;Long Term: Adherence to nutrition and physical activity/exercise program aimed toward attainment of established weight goal;Weight Maintenance: Understanding of the daily nutrition guidelines, which includes 25-35% calories from fat, 7% or less cal from saturated fats, less than 251m cholesterol, less than 1.5gm of sodium, & 5 or more  servings of fruits and vegetables daily;Weight Loss: Understanding of general recommendations for a balanced deficit meal plan, which promotes 1-2 lb weight loss per week and includes a negative energy balance of [phone] kcal/d;Understanding recommendations for meals to include 15-35% energy as protein, 25-35% energy from fat, 35-60% energy from carbohydrates, less than 2075mof dietary cholesterol, 20-35 gm of total fiber daily;Understanding of distribution of calorie intake throughout the day with the consumption of 4-5 meals/snacks    Hypertension  Yes    Intervention  Monitor prescription use compliance.;Provide education on lifestyle modifcations including regular physical activity/exercise, weight management, moderate sodium restriction and increased consumption of fresh fruit, vegetables, and low fat dairy, alcohol moderation, and smoking cessation.    Expected Outcomes  Short Term: Continued assessment and intervention until BP is < 140/9049mG in hypertensive participants. < 130/75m32m in hypertensive participants with diabetes, heart failure or chronic kidney disease.;Long Term: Maintenance of blood pressure at goal levels.    Lipids  Yes    Intervention  Provide education and support for participant on nutrition & aerobic/resistive exercise along with prescribed medications to achieve LDL <70mg26mL >40mg.32mExpected Outcomes  Short Term: Participant states understanding of desired cholesterol values and is compliant with medications prescribed. Participant is following exercise prescription and nutrition guidelines.;Long Term: Cholesterol controlled with medications as prescribed, with individualized exercise RX and with personalized nutrition plan. Value goals: LDL < 70mg, 29m> 40 mg.        Personal Goals Discharge: Goals and Risk Factor Review    Row Name 11/12/17 1115 12/08/17 1058 01/05/18 1545 01/27/18 1404       Core Components/Risk Factors/Patient Goals Review   Personal Goals  Review  Weight Management/Obesity;Lipids  Weight Management/Obesity;Lipids  Weight Management/Obesity;Lipids  Weight Management/Obesity;Lipids    Review  Jacqueline Burke is Jacqueline Burke to a good start to exercise will continue to monitor blood pressures as Jacqueline Burke hasCollie Siadd some moderate variations.  Jacqueline Burke's vital signs have been stable at cardiac rehab. Jacqueline Burke is Jacqueline Siadng well with exercise and rest as needed. Jacqueline Burke's weights have been stable.  Jacqueline Burke's vital signs have been stable at cardiac rehab. Jacqueline Burke is Jacqueline Siadng well with exercise and rest as needed. Jacqueline Burke's weights have been stable.  Jacqueline Burke hasCollie Siadmpleted 35 sessions of CR. She maintained her weight and vital signs throughout the program.     Expected Outcomes  Jacqueline Burke wilCollie Siadontinue to take her medications as presribed and participate in phase 2 cardiac rehab.  Jacqueline Burke wilCollie Siadontinue to take her medications as presribed and participate in phase 2 cardiac rehab.  Jacqueline Burke wilCollie Siadontinue to take her medications as presribed and participate in phase 2 cardiac rehab.  Jacqueline Burke wilCollie Siadontinue to take her medications as presribed and  exercise by participating in [place] [facility] or the Maintenance Phase of CR.       Exercise Goals and Review:   Nutrition & Weight - Outcomes: Pre Biometrics - 10/29/17 1506      Pre Biometrics   Height  _0  (1.549 m)    Weight  75.5 kg    Waist Circumference  37 inches    Hip Circumference  43 inches    Waist to Hip Ratio  0.86 %    BMI (Calculated)  31.47    Triceps Skinfold  34 mm    % Body Fat  44.1 %    Grip Strength  24 kg    Flexibility  12 in    Single Leg Stand  15.5 seconds      Post Biometrics - 01/27/18 1040       Post  Biometrics   Height  _1  (1.549 m)    Weight  73.2 kg    Waist Circumference  34.5 inches    Hip Circumference  41.5 inches    Waist to Hip Ratio  0.83 %    BMI (Calculated)  30.51    Triceps Skinfold  37 mm    % Body Fat  43.1 %    Grip Strength  23 kg    Flexibility  12 in    Single Leg Stand  7.37 seconds        Nutrition: Nutrition Therapy & Goals - 11/02/17 1031      Nutrition Therapy   Diet  Heart Healthy      Personal Nutrition Goals   Nutrition Goal  Pt to identify food quantities necessary to achieve weight loss of 6-24 lb at graduation from cardiac rehab.       Intervention Plan   Intervention  Prescribe, educate and counsel regarding individualized specific dietary modifications aiming towards targeted core components such as weight, hypertension, lipid management, diabetes, heart failure and other comorbidities.    Expected Outcomes  Short Term Goal: Understand basic principles of dietary content, such as calories, fat, sodium, cholesterol and nutrients.;Long Term Goal: Adherence to prescribed nutrition plan.       Nutrition Discharge: Nutrition Assessments - 02/18/18 0835      MEDFICTS Scores   Pre Score  26    Post Score  10    Score Difference  -16       Education Questionnaire Score: Knowledge Questionnaire Score - 01/27/18 1129      Knowledge Questionnaire Score   Pre Score  16/24    Post Score  23/24       Goals reviewed with patient; copy given to patient.Sydnei graduated from cardiac rehab program on 01/27/18 with completion of 35 exercise sessions in Phase II. Pt maintained good attendance and progressed nicely during her participation in rehab as evidenced by increased MET level.   Medication list reconciled. Repeat  PHQ score- 0  Pt has made significant lifestyle changes and should be commended for her success. Pt feels he has achieved her goals during cardiac rehab.   Pt was encouraged to continue exercise on her own. Jacqueline Burke increased her distance on her post exercise walk test and lost weight. We are proud of Jacqueline Burke's progress.Jacqueline Pall, RN,BSN 02/24/2018 5:14 PM

## 2018-02-24 NOTE — Progress Notes (Signed)
Cardiology Office Note:    Date:  02/24/2018   ID:  Jacqueline Burke, DOB November 16, 1935, MRN [id number]  PCP:  Jacqueline Austin, MD  Cardiologist:  Jacqueline Him, MD    Referring MD: Jacqueline Austin, MD   Chief Complaint  Patient presents with  . Coronary Artery Disease  . Hypertension  . Hyperlipidemia    History of Present Illness:    Jacqueline Burke is a 82 y.o. female with a hx of hypertension, asymptomatic PVCs, ASCAD s/p NSTEMI with peak troponin of 19 in setting of acute respiratory failure and E Coli bacteremia in January 2019. 2D echo with normal LVEF, elevated LVEDP and cath with  95% RCA stenosis s/p PCI of the RCA with DES and normal EF, EF 55-65% s/p DES to RCA.  Has been participating in cardiac rehab and was actually seen back in June because she experienced some chest discomfort while in cardiac rehab that was described as a burning sensation in the setting of hypertension.  Her amlodipine was increased to 7.5 mg daily and she was seen back by my extender in June with improved blood pressure results.  She is here today for followup and is doing well.  She denies any chest pain or pressure, PND, orthopnea, LE edema, dizziness, palpitations or syncope.  She continues to have some problems with shortness of breath and says that very rarely she will feel some pressure in her chest.  Symptoms did seem to improve with amlodipine.  She is compliant with her meds and is tolerating meds with no SE.    Past Medical History:  Diagnosis Date  . Allergic rhinitis   . CAD (coronary artery disease), native coronary artery    NSTEMI with 95% RCA s/p DES to RCA 06/2017  . Diverticulosis   . GERD (gastroesophageal reflux disease)   . Heart murmur    Systolic heart murmur with Aortic valve sclerosis by ECHO  . History of kidney stones   . Hyperlipidemia   . Hypertension   . Osteoarthritis    Erosive OA-MRI of R hand negative for synovitis, only OA-Dr Jacqueline Burke  . Osteopenia   . Pelvic prolapse   .  PVC's (premature ventricular contractions)     Past Surgical History:  Procedure Laterality Date  . BUNIONECTOMY  08/2006   R foot and hammer roe right second toe  . CARDIAC CATHETERIZATION    . CARPAL TUNNEL RELEASE  2002   bil hands  . CORONARY STENT INTERVENTION N/A 06/15/2017   Procedure: CORONARY STENT INTERVENTION;  Surgeon: Jacqueline Harp, MD;  Location: [facility];  Service: Cardiovascular;  Laterality: N/A;  . CYSTOSCOPY     with laser lithotripsy Dr. Gloriann Burke 07-20-17   . CYSTOSCOPY W/ URETERAL STENT PLACEMENT Left 06/09/2017   Procedure: CYSTOSCOPY WITH RETROGRADE PYELOGRAM/URETERAL STENT PLACEMENT;  Surgeon: Jacqueline Mallow, MD;  Location: [facility];  Service: Urology;  Laterality: Left;  . CYSTOSCOPY WITH RETROGRADE PYELOGRAM, URETEROSCOPY AND STENT PLACEMENT Left 07/20/2017   Procedure: CYSTOSCOPY WITH RETROGRADE PYELOGRAM, LEFT URETEROSCOPY HOLMIUM LASER LITHO  AND STENT EXCHANGE;  Surgeon: Jacqueline Mallow, MD;  Location: [facility];  Service: Urology;  Laterality: Left;  . HOLMIUM LASER APPLICATION Left 8/78/6767   Procedure: HOLMIUM LASER APPLICATION;  Surgeon: Jacqueline Mallow, MD;  Location: [facility];  Service: Urology;  Laterality: Left;  . LEFT HEART CATH AND CORONARY ANGIOGRAPHY N/A 06/15/2017   Procedure: LEFT HEART CATH AND CORONARY ANGIOGRAPHY;  Surgeon: Jacqueline Harp,  MD;  Location: [facility];  Service: Cardiovascular;  Laterality: N/A;  . TOTAL KNEE ARTHROPLASTY Left 08/2007  . TOTAL KNEE ARTHROPLASTY Right 03/2010    Current Medications: Current Meds  Medication Sig  . acetaminophen (TYLENOL) 500 MG tablet Take 1,000 mg by mouth every 8 (eight) hours as needed for moderate pain or headache.  Marland Kitchen amitriptyline (ELAVIL) 10 MG tablet Take 20 mg by mouth at bedtime.   Marland Kitchen amLODipine (NORVASC) 5 MG tablet Take 1.5 tablets (7.5 mg total) by mouth daily.  . Ascorbic Acid (VITAMIN C PO) Take 1,000 mg by mouth daily.   Marland Kitchen aspirin EC 81 MG tablet Take 81 mg by  mouth daily. On hold for procedure  . atorvastatin (LIPITOR) 80 MG tablet Take 1 tablet (80 mg total) by mouth daily at 6 PM.  . cetirizine (ZYRTEC) 10 MG chewable tablet Chew 10 mg by mouth daily.  . Cholecalciferol (VITAMIN D PO) Take 3,000 Units by mouth daily.   Marland Kitchen docusate sodium (COLACE) 100 MG capsule Take 200 mg by mouth daily.   Marland Kitchen FIBER PO Take 2 tablets by mouth daily.  . fluticasone (FLONASE) 50 MCG/ACT nasal spray Place 2 sprays into both nostrils daily.  Marland Kitchen gabapentin (NEURONTIN) 100 MG capsule Take 100 mg by mouth 2 (two) times daily.   . metoprolol tartrate (LOPRESSOR) 25 MG tablet Take 1 tablet (25 mg total) by mouth 2 (two) times daily.  . Multiple Vitamins-Minerals (ZINC PO) Take 50 mg by mouth daily.  . trospium (SANCTURA) 20 MG tablet Take 60 mg by mouth daily.  . [DISCONTINUED] ticagrelor (BRILINTA) 90 MG TABS tablet Take 1 tablet (90 mg total) by mouth 2 (two) times daily.     Allergies:   Ciprofloxacin; Codeine; Demerol [meperidine]; Other; Hydrogen peroxide; Propoxyphene; and Tramadol   Social History   Socioeconomic History  . Marital status: Married    Spouse name: Not on file  . Number of children: Not on file  . Years of education: Not on file  . Highest education level: Not on file  Occupational History  . Occupation: Retired  Scientific laboratory technician  . Financial resource strain: Not on file  . Food insecurity:    Worry: Never true    Inability: Never true  . Transportation needs:    Medical: No    Non-medical: No  Tobacco Use  . Smoking status: Former Smoker    Packs/day: 1.00    Years: 25.00    Pack years: 25.00    Types: Cigarettes    Last attempt to quit: 06/02/1993    Years since quitting: 24.7  . Smokeless tobacco: Never Used  Substance and Sexual Activity  . Alcohol use: No  . Drug use: No  . Sexual activity: Not Currently  Lifestyle  . Physical activity:    Days per week: 0 days    Minutes per session: 0 min  . Stress: Only a little    Relationships  . Social connections:    Talks on phone: Not on file    Gets together: Not on file    Attends religious service: Not on file    Active member of club or organization: Not on file    Attends meetings of clubs or organizations: Not on file    Relationship status: Not on file  Other Topics Concern  . Not on file  Social History Narrative  . Not on file     Family History: The patient's family history includes Breast cancer in  her cousin; Diabetes in her mother; Stroke in her mother.  ROS:   Please see the history of present illness.    ROS  All other systems reviewed and negative.   EKGs/Labs/Other Studies Reviewed:    The following studies were reviewed today: none  EKG:  EKG is not ordered today.    Recent Labs: 06/14/2017: B Natriuretic Peptide 326.1 06/16/2017: ALT 47 07/02/2017: Hemoglobin 11.3; Magnesium 1.9; Platelets 256 07/17/2017: BUN 23; Creatinine, Ser 1.06; Potassium 4.7; Sodium 144   Recent Lipid Panel    Component Value Date/Time   CHOL 79 06/16/2017 0535   TRIG 116 06/16/2017 0535   HDL 23 (L) 06/16/2017 0535   CHOLHDL 3.4 06/16/2017 0535   VLDL 23 06/16/2017 0535   LDLCALC 33 06/16/2017 0535    Physical Exam:    VS:  BP 118/72   Pulse (!) 58   Ht 5\' 1"  (1.549 m)   Wt 159 lb 6.4 oz (72.3 kg)   SpO2 95%   BMI 30.12 kg/m     Wt Readings from Last 3 Encounters:  02/24/18 159 lb 6.4 oz (72.3 kg)  01/27/18 161 lb 6 oz (73.2 kg)  11/26/17 166 lb (75.3 kg)     GEN:  Well nourished, well developed in no acute distress HEENT: Normal NECK: No JVD; No carotid bruits LYMPHATICS: No lymphadenopathy CARDIAC: RRR, no murmurs, rubs, gallops RESPIRATORY:  Clear to auscultation without rales, wheezing or rhonchi  ABDOMEN: Soft, non-tender, non-distended MUSCULOSKELETAL:  No edema; No deformity  SKIN: Warm and dry NEUROLOGIC:  Alert and oriented x 3 PSYCHIATRIC:  Normal affect   ASSESSMENT:    1. Coronary artery disease involving  native coronary artery of native heart without angina pectoris   2. Benign hypertensive heart disease without heart failure   3. Hyperlipidemia LDL goal <70   4. SOB (shortness of breath)   5. Shortness of breath    PLAN:    In order of problems listed above:  1.  ASCAD -  s/p NSTEMI 06/2017 with 95% RCA stenosis s/p PCI with DES.  She denies any further anginal symptoms.  She will continue on aspirin 81 mg daily,statin and beta-blocker.  Change Brilinta to Plavix 75 mg daily due to ongoing shortness of breath.  2.  HTN -BP is well controlled on exam today.  She will continue on amlodipine 7.5 mg daily, Lopressor 25 mg twice daily.  3.  Hyperlipidemia with LDL goal < 70.  Her LDL was 33 on 06/16/2017 with normal ALT.  She will continue on atorvastatin 80 mg daily.  4.  Shortness of breath - I am suspicious that this may be related to her Brilinta.  She also complained to me today that the Kary Kos is very expensive and she is in the donut hole.  Therefore I am going to stop her Brilinta and start her on Plavix 75 mg daily.  I instructed her to make sure she takes her Brilinta both in the morning in the evening the day prior to starting Plavix and stopping Brilinta.  I will check a TSH, CBC and BNP as well to assess.  We will see my extender back in 4 weeks to see if shortness of breath is improved.   Medication Adjustments/Labs and Tests Ordered: Current medicines are reviewed at length with the patient today.  Concerns regarding medicines are outlined above.  Orders Placed This Encounter  Procedures  . CBC  . TSH  . Pro b natriuretic peptide (BNP)  Meds ordered this encounter  Medications  . clopidogrel (PLAVIX) 75 MG tablet    Sig: Take 1 tablet (75 mg total) by mouth daily.    Dispense:  90 tablet    Refill:  3    Signed, Jacqueline Him, MD  02/24/2018 9:13 AM    [place]

## 2018-02-25 LAB — CBC
HEMATOCRIT: 34.4 % (ref 34.0–46.6)
Hemoglobin: 11.5 g/dL (ref 11.1–15.9)
MCH: 30.4 pg (ref 26.6–33.0)
MCHC: 33.4 g/dL (ref 31.5–35.7)
MCV: 91 fL (ref 79–97)
PLATELETS: 256 10*3/uL (ref 150–450)
RBC: 3.78 x10E6/uL (ref 3.77–5.28)
RDW: 13.8 % (ref 12.3–15.4)
WBC: 6.4 10*3/uL (ref 3.4–10.8)

## 2018-02-25 LAB — PRO B NATRIURETIC PEPTIDE: NT-PRO BNP: 156 pg/mL (ref 0–738)

## 2018-02-25 LAB — TSH: TSH: 2.36 u[IU]/mL (ref 0.450–4.500)

## 2018-02-26 NOTE — Patient Outreach (Signed)
[facility]) Care Management  02/26/2018  Jacqueline Burke 29-Jan-1936 [id number]  Unsuccessful call to [name] regarding missing financial documents for PAP packet (Brilinta).  HIPAA compliant voicemail left encouraging callback.    PLAN: -I will f/u within 4 business days unless call returned sooner  Regina Eck, PharmD, [place]  [phone]

## 2018-03-01 ENCOUNTER — Other Ambulatory Visit: Payer: Self-pay | Admitting: Pharmacist

## 2018-03-01 NOTE — Patient Outreach (Signed)
[place] [facility]) Care Management  03/01/2018  FELIZA DIVEN [birth year] [id number]  Successful outreach call to Ms. [place] with HIPAA identifiers verified.  Patient has now switched from Brilinta to Plavix.  She reported SOB that has been present since cardiac event, however it has not dissipated.  Cardiologist agreed to switch to Plavix.  She no longer needs financial assistance with medications.  Patient reports copays have gone from $90 to $10/month now that she is on Plavix.  She was very Patent attorney and thankful for [place] services.   PLAN: [facility] Pharmacist to sign off.  I'm happy to assist in the future as needed.  Regina Eck, PharmD, [place]  [phone]

## 2018-03-04 DIAGNOSIS — I10 Essential (primary) hypertension: Secondary | ICD-10-CM | POA: Diagnosis not present

## 2018-03-04 DIAGNOSIS — N183 Chronic kidney disease, stage 3 (moderate): Secondary | ICD-10-CM | POA: Diagnosis not present

## 2018-03-04 DIAGNOSIS — J309 Allergic rhinitis, unspecified: Secondary | ICD-10-CM | POA: Diagnosis not present

## 2018-03-09 ENCOUNTER — Encounter: Payer: Self-pay | Admitting: *Deleted

## 2018-03-22 DIAGNOSIS — J209 Acute bronchitis, unspecified: Secondary | ICD-10-CM | POA: Diagnosis not present

## 2018-03-23 NOTE — Progress Notes (Signed)
Cardiology Office Note    Date:  03/24/2018   ID:  Jacqueline Burke, DOB 1935/10/25, MRN [id number]  PCP:  Darcus Austin, MD  Cardiologist: Fransico Him, MD EPS: None  No chief complaint on file.   History of Present Illness:  Jacqueline Burke is a 82 y.o. female with history of CAD status post NSTEMI with peak troponin of 19 in the setting of acute respiratory failure and E. coli bacteremia 06/2017 status post DES to the RCA with normal LVEF 55 to 65%.  2D echo normal LVEF.  Also has history of hypertension, asymptomatic PVCs.  Chest pain in the setting of elevated blood pressure 10/2017 with normal nuclear stress test.  Last saw Dr. Radford Pax 02/24/2018 and had some shortness of breath that was felt might be related to Brilinta.  She was switched to Plavix. BNP that day was normal as well as CBC and TSH.  C met normal 03/04/2018.  Patient comes in for follow-up.  She says within 48 hours her shortness of breath resolved.  Since she was last seen she went to [facility] and developed an upper respiratory infection and was started on antibiotics.  She is feeling better.  She denies any cardiac complaints today.    Past Medical History:  Diagnosis Date  . Allergic rhinitis   . CAD (coronary artery disease), native coronary artery    NSTEMI with 95% RCA s/p DES to RCA 06/2017  . Diverticulosis   . GERD (gastroesophageal reflux disease)   . Heart murmur    Systolic heart murmur with Aortic valve sclerosis by ECHO  . History of kidney stones   . Hyperlipidemia   . Hypertension   . Osteoarthritis    Erosive OA-MRI of R hand negative for synovitis, only OA-Dr Trudie Reed  . Osteopenia   . Pelvic prolapse   . PVC's (premature ventricular contractions)     Past Surgical History:  Procedure Laterality Date  . BUNIONECTOMY  08/2006   R foot and hammer roe right second toe  . CARDIAC CATHETERIZATION    . CARPAL TUNNEL RELEASE  2002   bil hands  . CORONARY STENT INTERVENTION N/A 06/15/2017   Procedure: CORONARY STENT INTERVENTION;  Surgeon: Lorretta Harp, MD;  Location: [place] [facility];  Service: Cardiovascular;  Laterality: N/A;  . CYSTOSCOPY     with laser lithotripsy Dr. Gloriann Loan 07-20-17   . CYSTOSCOPY W/ URETERAL STENT PLACEMENT Left 06/09/2017   Procedure: CYSTOSCOPY WITH RETROGRADE PYELOGRAM/URETERAL STENT PLACEMENT;  Surgeon: Lucas Mallow, MD;  Location: [facility];  Service: Urology;  Laterality: Left;  . CYSTOSCOPY WITH RETROGRADE PYELOGRAM, URETEROSCOPY AND STENT PLACEMENT Left 07/20/2017   Procedure: CYSTOSCOPY WITH RETROGRADE PYELOGRAM, LEFT URETEROSCOPY HOLMIUM LASER LITHO  AND STENT EXCHANGE;  Surgeon: Lucas Mallow, MD;  Location: [facility];  Service: Urology;  Laterality: Left;  . HOLMIUM LASER APPLICATION Left 1/70/0174   Procedure: HOLMIUM LASER APPLICATION;  Surgeon: Lucas Mallow, MD;  Location: [facility];  Service: Urology;  Laterality: Left;  . LEFT HEART CATH AND CORONARY ANGIOGRAPHY N/A 06/15/2017   Procedure: LEFT HEART CATH AND CORONARY ANGIOGRAPHY;  Surgeon: Lorretta Harp, MD;  Location: [place] [facility];  Service: Cardiovascular;  Laterality: N/A;  . TOTAL KNEE ARTHROPLASTY Left 08/2007  . TOTAL KNEE ARTHROPLASTY Right 03/2010    Current Medications: Current Meds  Medication Sig  . acetaminophen (TYLENOL) 500 MG tablet Take 1,000 mg by mouth every 8 (eight) hours as needed for  moderate pain or headache.  Marland Kitchen amitriptyline (ELAVIL) 10 MG tablet Take 20 mg by mouth at bedtime.   Marland Kitchen amLODipine (NORVASC) 2.5 MG tablet Take 2.5 mg by mouth daily. Takes with 5 mg tablet  . amLODipine (NORVASC) 5 MG tablet Take 5 mg by mouth daily. Takes with 2.5 mg tablet  . amoxicillin-clavulanate (AUGMENTIN) 875-125 MG tablet TAKE 1 TABLET BY MOUTH EVERY 12 HOURS FOR 10 DAYS  . Ascorbic Acid (VITAMIN C PO) Take 1,000 mg by mouth daily.   Marland Kitchen aspirin EC 81 MG tablet Take 81 mg by mouth daily. On hold for procedure  . atorvastatin (LIPITOR) 80 MG tablet Take 1 tablet  (80 mg total) by mouth daily at 6 PM.  . cetirizine (ZYRTEC) 10 MG chewable tablet Chew 10 mg by mouth daily.  . Cholecalciferol (VITAMIN D PO) Take 3,000 Units by mouth daily.   . clopidogrel (PLAVIX) 75 MG tablet Take 1 tablet (75 mg total) by mouth daily.  Marland Kitchen docusate sodium (COLACE) 100 MG capsule Take 200 mg by mouth daily.   Marland Kitchen FIBER PO Take 2 tablets by mouth daily.  . fluticasone (FLONASE) 50 MCG/ACT nasal spray Place 2 sprays into both nostrils daily.  Marland Kitchen gabapentin (NEURONTIN) 100 MG capsule Take 100 mg by mouth 2 (two) times daily.   . metoprolol tartrate (LOPRESSOR) 25 MG tablet Take 1 tablet (25 mg total) by mouth 2 (two) times daily.  . Multiple Vitamins-Minerals (ZINC PO) Take 50 mg by mouth daily.  . trospium (SANCTURA) 20 MG tablet Take 60 mg by mouth daily.     Allergies:   Ciprofloxacin; Codeine; Demerol [meperidine]; Other; Hydrogen peroxide; Propoxyphene; and Tramadol   Social History   Socioeconomic History  . Marital status: Married    Spouse name: Not on file  . Number of children: Not on file  . Years of education: Not on file  . Highest education level: Not on file  Occupational History  . Occupation: Retired  Scientific laboratory technician  . Financial resource strain: Not on file  . Food insecurity:    Worry: Never true    Inability: Never true  . Transportation needs:    Medical: No    Non-medical: No  Tobacco Use  . Smoking status: Former Smoker    Packs/day: 1.00    Years: 25.00    Pack years: 25.00    Types: Cigarettes    Last attempt to quit: 06/02/1993    Years since quitting: 24.8  . Smokeless tobacco: Never Used  Substance and Sexual Activity  . Alcohol use: No  . Drug use: No  . Sexual activity: Not Currently  Lifestyle  . Physical activity:    Days per week: 0 days    Minutes per session: 0 min  . Stress: Only a little  Relationships  . Social connections:    Talks on phone: Not on file    Gets together: Not on file    Attends religious service:  Not on file    Active member of club or organization: Not on file    Attends meetings of clubs or organizations: Not on file    Relationship status: Not on file  Other Topics Concern  . Not on file  Social History Narrative  . Not on file     Family History:  The patient's family history includes Breast cancer in her cousin; Diabetes in her mother; Heart attack in her mother; Osteoporosis in her father; Stroke in her mother.   ROS:  Please see the history of present illness.    Review of Systems  Constitution: Negative.  HENT: Negative.   Eyes: Negative.   Cardiovascular: Negative.   Respiratory: Positive for cough.   Hematologic/Lymphatic: Negative.   Musculoskeletal: Negative.  Negative for joint pain.  Gastrointestinal: Negative.   Genitourinary: Negative.   Neurological: Negative.    All other systems reviewed and are negative.   PHYSICAL EXAM:   VS:  BP 140/78 (BP Location: Left Arm, Patient Position: Sitting, Cuff Size: Normal)   Pulse 61   Ht '5\' 1"'  (1.549 m)   Wt 162 lb 12.8 oz (73.8 kg)   SpO2 97% Comment: at rest  BMI 30.76 kg/m   Physical Exam  GEN: Well nourished, well developed, in no acute distress  Neck: no JVD, carotid bruits, or masses Cardiac:RRR; no murmurs, rubs, or gallops  Respiratory:  clear to auscultation bilaterally, normal work of breathing GI: soft, nontender, nondistended, + BS Ext: without cyanosis, clubbing, or edema, Good distal pulses bilaterally Neuro:  Alert and Oriented x 3, Psych: euthymic mood, full affect  Wt Readings from Last 3 Encounters:  03/24/18 162 lb 12.8 oz (73.8 kg)  02/24/18 159 lb 6.4 oz (72.3 kg)  01/27/18 161 lb 6 oz (73.2 kg)      Studies/Labs Reviewed:   EKG:  EKG is not ordered today.   Recent Labs: 06/14/2017: B Natriuretic Peptide 326.1 06/16/2017: ALT 47 07/02/2017: Magnesium 1.9 07/17/2017: BUN 23; Creatinine, Ser 1.06; Potassium 4.7; Sodium 144 02/24/2018: Hemoglobin 11.5; NT-Pro BNP 156; Platelets  256; TSH 2.360   Lipid Panel    Component Value Date/Time   CHOL 79 06/16/2017 0535   TRIG 116 06/16/2017 0535   HDL 23 (L) 06/16/2017 0535   CHOLHDL 3.4 06/16/2017 0535   VLDL 23 06/16/2017 0535   LDLCALC 33 06/16/2017 0535    Additional studies/ records that were reviewed today include:  Nuclear stress test 11/23/2017 Study Highlights    Nuclear stress EF: 70%.  Normal perfusion with minimal apical thinning No ischemia or scar  Low risk scan       Cardiac catheterization 06/15/2017   mid RCA lesion is 95% stenosed.  A stent was successfully placed.  Post intervention, there is a 0% residual stenosis.  The left ventricular systolic function is normal.  LV end diastolic pressure is normal.  The left ventricular ejection fraction is 55-65% by visual estimate.   Jacqueline Burke is a 82 y.o. female      [id number] LOCATION:  FACILITY: [facility]  PHYSICIAN: Quay Burow, M.D. [birth year]  2D echo 06/10/2017 Study Conclusions   - Left ventricle: The estimated ejection fraction was 55%. Doppler   parameters are consistent with both elevated ventricular   end-diastolic filling pressure and elevated left atrial filling   pressure. - Mitral valve: Calcified annulus. Mildly thickened leaflets . - Left atrium: The atrium was mildly dilated. - Atrial septum: There was increased thickness of the septum,   consistent with lipomatous hypertrophy. No defect or patent   foramen ovale was identified. - Pulmonary arteries: PA peak pressure: 34 mm Hg (S).    ASSESSMENT:    1. Coronary artery disease involving native coronary artery of native heart without angina pectoris   2. Benign hypertensive heart disease without heart failure   3. Hyperlipidemia LDL goal <70      PLAN:  In order of problems listed above:  CAD status post NSTEMI with peak troponin of 19 in the setting of acute respiratory failure  and E. coli bacteremia 06/2017 status post DES to the RCA with normal LVEF  55 to 65%.  Normal nuclear stress test 10/2017 for chest pain.  Brilinta switch to Plavix for dyspnea last office visit.  Feeling much better.  Follow-up with Dr. Radford Pax in 1 year.  Essential hypertension pressure slightly elevated today.  She is dealing with an upper respiratory infection and on antibiotics.  No changes today.  Hyperlipidemia LDL was below goal 06/2017     Medication Adjustments/Labs and Tests Ordered: Current medicines are reviewed at length with the patient today.  Concerns regarding medicines are outlined above.  Medication changes, Labs and Tests ordered today are listed in the Patient Instructions below. Patient Instructions  Medication Instructions:  Your physician recommends that you continue on your current medications as directed. Please refer to the Current Medication list given to you today.  If you need a refill on your cardiac medications before your next appointment, please call your pharmacy.   Lab work: None Ordered  If you have labs (blood work) drawn today and your tests are completely normal, you will receive your results only by: Marland Kitchen MyChart Message (if you have MyChart) OR . A paper copy in the mail If you have any lab test that is abnormal or we need to change your treatment, we will call you to review the results.  Testing/Procedures: None ordered  Follow-Up: At [facility], you and your health needs are our priority.  As part of our continuing mission to provide you with exceptional heart care, we have created designated Provider Care Teams.  These Care Teams include your primary Cardiologist (physician) and Advanced Practice Providers (APPs -  Physician Assistants and Nurse Practitioners) who all work together to provide you with the care you need, when you need it. . You will need a follow up appointment in 1 year.  Please call our office 2 months in advance to schedule this appointment.  You may see Fransico Him, MD or one of the following  Advanced Practice Providers on your designated Care Team:   . Lyda Jester, PA-C . Dayna Dunn, PA-C . Ermalinda Barrios, PA-C  Any Other Special Instructions Will Be Listed Below (If Applicable).       Sumner Boast, PA-C  03/24/2018 8:26 AM    [facility] [address], [place], [place]  [address] Phone: [phone]; Fax: [phone]

## 2018-03-24 ENCOUNTER — Encounter: Payer: Self-pay | Admitting: Physician Assistant

## 2018-03-24 ENCOUNTER — Ambulatory Visit: Payer: PPO | Admitting: Physician Assistant

## 2018-03-24 VITALS — BP 140/78 | HR 61 | Ht 61.0 in | Wt 162.8 lb

## 2018-03-24 DIAGNOSIS — E785 Hyperlipidemia, unspecified: Secondary | ICD-10-CM | POA: Diagnosis not present

## 2018-03-24 DIAGNOSIS — I119 Hypertensive heart disease without heart failure: Secondary | ICD-10-CM | POA: Diagnosis not present

## 2018-03-24 DIAGNOSIS — I2510 Atherosclerotic heart disease of native coronary artery without angina pectoris: Secondary | ICD-10-CM | POA: Diagnosis not present

## 2018-03-24 NOTE — Patient Instructions (Signed)

## 2018-04-01 ENCOUNTER — Ambulatory Visit
Admission: RE | Admit: 2018-04-01 | Discharge: 2018-04-01 | Disposition: A | Discharge status: Home / Self Care | Payer: PPO | Source: Ambulatory Visit | Attending: Family Medicine | Admitting: Family Medicine

## 2018-04-01 DIAGNOSIS — Z1231 Encounter for screening mammogram for malignant neoplasm of breast: Secondary | ICD-10-CM | POA: Diagnosis not present

## 2018-05-28 DIAGNOSIS — J069 Acute upper respiratory infection, unspecified: Secondary | ICD-10-CM | POA: Diagnosis not present

## 2018-06-14 ENCOUNTER — Encounter (INDEPENDENT_AMBULATORY_CARE_PROVIDER_SITE_OTHER): Payer: Self-pay | Admitting: Family Medicine

## 2018-06-14 ENCOUNTER — Ambulatory Visit (INDEPENDENT_AMBULATORY_CARE_PROVIDER_SITE_OTHER): Payer: PPO | Admitting: Family Medicine

## 2018-06-14 ENCOUNTER — Ambulatory Visit (INDEPENDENT_AMBULATORY_CARE_PROVIDER_SITE_OTHER): Payer: PPO

## 2018-06-14 DIAGNOSIS — M25511 Pain in right shoulder: Secondary | ICD-10-CM | POA: Diagnosis not present

## 2018-06-14 MED ORDER — METHOCARBAMOL 500 MG PO TABS: 500.0000 mg | ORAL_TABLET | Freq: Four times a day (QID) | ORAL | 1 refills | Status: DC | PRN

## 2018-06-14 MED ORDER — HYDROCODONE-ACETAMINOPHEN 5-325 MG PO TABS: 1.0000 | ORAL_TABLET | Freq: Four times a day (QID) | ORAL | 0 refills | Status: DC | PRN

## 2018-06-14 NOTE — Progress Notes (Signed)
Office Visit Note   Patient: Jacqueline Burke           Date of Birth: 1935/06/21           MRN: [id number] Visit Date: 06/14/2018 Requested by: No referring provider defined for this encounter. PCP: Darcus Austin, MD (Inactive)  Subjective: Chief Complaint  Patient presents with  . Right Shoulder - Pain    Fell yesterday - tripped while going into the kitchen. Landed on left side, but tried to catch self with the right arm on the door facing. Pain in the right shoulder since. Decreased ROM due to pain.    HPI: She is an 83 year old right-hand-dominant female with right shoulder pain.  Yesterday she tripped walking into her kitchen.  She fell on her left side.  On the way down she tried to catch herself with her right arm.  She did not have any left-sided pain but her right shoulder has been hurting ever since then.  She had trouble sleeping last night because of her pain.  She borrowed her husband's shoulder sling and now presents for evaluation.  No previous problems with her right shoulder.  She has a history of heart disease with cardiac stent placement.  She is on anticoagulants.              ROS: Other systems were reviewed and are negative.  Objective: Vital Signs: There were no vitals taken for this visit.  Physical Exam:  Right shoulder: Limited active abduction and forward flexion.  No visible bruising or swelling.  No tenderness at the [facility] joint or the long head biceps tendon.  Very tender in the lateral subacromial space.  Isometric external rotation is slightly weak and painful.  Empty can test is also painful.  Speeds test is negative.  Imaging: X-rays right shoulder: AC joint arthropathy with mild glenohumeral DJD.  Anatomic alignment with no obvious fracture.  Assessment & Plan: 1.  1 day status post fall with right shoulder pain, concerning for supraspinatus and possibly infraspinatus tears. -Shoulder sling, range of motion as tolerated.  Muscle relaxant as needed.   Physical therapy if not improving.  MRI scan if fails conservative management.  If unable to go through MRI scan, could try a one-time cortisone injection.   Follow-Up Instructions: No follow-ups on file.      Procedures: No procedures performed  No notes on file    PMFS History: Patient Active Problem List   Diagnosis Date Noted  . CAD (coronary artery disease), native coronary artery 08/25/2017  . Hyperlipidemia LDL goal <70 08/25/2017  . NSTEMI (non-ST elevated myocardial infarction) ([place]) 06/12/2017  . Acute respiratory failure with hypoxemia ([place])   . E coli bacteremia   . Pyelonephritis   . Chronic chest pain   . Ureteral calculus 06/09/2017  . Edema extremities 02/16/2014  . Sinusitis, chronic 11/11/2013  . Dyspnea 11/11/2013  . PVC's (premature ventricular contractions) 08/19/2013  . Benign hypertensive heart disease without heart failure 08/15/2013   Past Medical History:  Diagnosis Date  . Allergic rhinitis   . CAD (coronary artery disease), native coronary artery    NSTEMI with 95% RCA s/p DES to RCA 06/2017  . Diverticulosis   . GERD (gastroesophageal reflux disease)   . Heart murmur    Systolic heart murmur with Aortic valve sclerosis by ECHO  . History of kidney stones   . Hyperlipidemia   . Hypertension   . Osteoarthritis    Erosive OA-MRI of R hand  negative for synovitis, only OA-Dr Trudie Reed  . Osteopenia   . Pelvic prolapse   . PVC's (premature ventricular contractions)     Family History  Problem Relation Age of Onset  . Stroke Mother   . Diabetes Mother   . Heart attack Mother   . Osteoporosis Father   . Breast cancer Cousin     Past Surgical History:  Procedure Laterality Date  . BUNIONECTOMY  08/2006   R foot and hammer roe right second toe  . CARDIAC CATHETERIZATION    . CARPAL TUNNEL RELEASE  2002   bil hands  . CORONARY STENT INTERVENTION N/A 06/15/2017   Procedure: CORONARY STENT INTERVENTION;  Surgeon: Lorretta Harp, MD;   Location: [place] [facility];  Service: Cardiovascular;  Laterality: N/A;  . CYSTOSCOPY     with laser lithotripsy Dr. Gloriann Loan 07-20-17   . CYSTOSCOPY W/ URETERAL STENT PLACEMENT Left 06/09/2017   Procedure: CYSTOSCOPY WITH RETROGRADE PYELOGRAM/URETERAL STENT PLACEMENT;  Surgeon: Lucas Mallow, MD;  [facility];  Service: Urology;  Laterality: Left;  . CYSTOSCOPY WITH RETROGRADE PYELOGRAM, URETEROSCOPY AND STENT PLACEMENT Left 07/20/2017   Procedure: CYSTOSCOPY WITH RETROGRADE PYELOGRAM, LEFT URETEROSCOPY HOLMIUM LASER LITHO  AND STENT EXCHANGE;  Surgeon: Lucas Mallow, MD;  [facility];  Service: Urology;  Laterality: Left;  . HOLMIUM LASER APPLICATION Left 07/06/5595   Procedure: HOLMIUM LASER APPLICATION;  Surgeon: Lucas Mallow, MD;  [facility];  Service: Urology;  Laterality: Left;  . LEFT HEART CATH AND CORONARY ANGIOGRAPHY N/A 06/15/2017   Procedure: LEFT HEART CATH AND CORONARY ANGIOGRAPHY;  Surgeon: Lorretta Harp, MD;  Location: [place] [facility];  Service: Cardiovascular;  Laterality: N/A;  . TOTAL KNEE ARTHROPLASTY Left 08/2007  . TOTAL KNEE ARTHROPLASTY Right 03/2010   Social History   Occupational History  . Occupation: Retired  Tobacco Use  . Smoking status: Former Smoker    Packs/day: 1.00    Years: 25.00    Pack years: 25.00    Types: Cigarettes    Last attempt to quit: 06/02/1993    Years since quitting: 25.0  . Smokeless tobacco: Never Used  Substance and Sexual Activity  . Alcohol use: No  . Drug use: No  . Sexual activity: Not Currently

## 2018-06-16 ENCOUNTER — Ambulatory Visit (INDEPENDENT_AMBULATORY_CARE_PROVIDER_SITE_OTHER): Payer: PPO | Admitting: Family Medicine

## 2018-08-01 DIAGNOSIS — J0100 Acute maxillary sinusitis, unspecified: Secondary | ICD-10-CM | POA: Diagnosis not present

## 2018-08-28 ENCOUNTER — Other Ambulatory Visit: Payer: Self-pay | Admitting: Cardiology

## 2018-09-14 DIAGNOSIS — Z0000 Encounter for general adult medical examination without abnormal findings: Secondary | ICD-10-CM | POA: Diagnosis not present

## 2018-09-14 DIAGNOSIS — E559 Vitamin D deficiency, unspecified: Secondary | ICD-10-CM | POA: Diagnosis not present

## 2018-09-14 DIAGNOSIS — E7800 Pure hypercholesterolemia, unspecified: Secondary | ICD-10-CM | POA: Diagnosis not present

## 2018-09-21 DIAGNOSIS — Z0000 Encounter for general adult medical examination without abnormal findings: Secondary | ICD-10-CM | POA: Diagnosis not present

## 2018-09-21 DIAGNOSIS — M810 Age-related osteoporosis without current pathological fracture: Secondary | ICD-10-CM | POA: Diagnosis not present

## 2018-09-21 DIAGNOSIS — N183 Chronic kidney disease, stage 3 (moderate): Secondary | ICD-10-CM | POA: Diagnosis not present

## 2018-09-21 DIAGNOSIS — E559 Vitamin D deficiency, unspecified: Secondary | ICD-10-CM | POA: Diagnosis not present

## 2018-09-21 DIAGNOSIS — I2510 Atherosclerotic heart disease of native coronary artery without angina pectoris: Secondary | ICD-10-CM | POA: Diagnosis not present

## 2018-09-21 DIAGNOSIS — E7800 Pure hypercholesterolemia, unspecified: Secondary | ICD-10-CM | POA: Diagnosis not present

## 2018-09-21 DIAGNOSIS — I10 Essential (primary) hypertension: Secondary | ICD-10-CM | POA: Diagnosis not present

## 2018-09-23 ENCOUNTER — Other Ambulatory Visit: Payer: Self-pay | Admitting: Family Medicine

## 2018-09-23 DIAGNOSIS — M810 Age-related osteoporosis without current pathological fracture: Secondary | ICD-10-CM

## 2018-10-06 DIAGNOSIS — N3946 Mixed incontinence: Secondary | ICD-10-CM | POA: Diagnosis not present

## 2018-10-06 DIAGNOSIS — Z87442 Personal history of urinary calculi: Secondary | ICD-10-CM | POA: Diagnosis not present

## 2018-12-06 DIAGNOSIS — M2550 Pain in unspecified joint: Secondary | ICD-10-CM | POA: Diagnosis not present

## 2018-12-06 DIAGNOSIS — Z6831 Body mass index (BMI) 31.0-31.9, adult: Secondary | ICD-10-CM | POA: Diagnosis not present

## 2018-12-06 DIAGNOSIS — K5900 Constipation, unspecified: Secondary | ICD-10-CM | POA: Diagnosis not present

## 2018-12-06 DIAGNOSIS — M150 Primary generalized (osteo)arthritis: Secondary | ICD-10-CM | POA: Diagnosis not present

## 2018-12-06 DIAGNOSIS — E669 Obesity, unspecified: Secondary | ICD-10-CM | POA: Diagnosis not present

## 2018-12-06 DIAGNOSIS — M545 Low back pain: Secondary | ICD-10-CM | POA: Diagnosis not present

## 2018-12-06 DIAGNOSIS — M154 Erosive (osteo)arthritis: Secondary | ICD-10-CM | POA: Diagnosis not present

## 2018-12-14 ENCOUNTER — Other Ambulatory Visit: Payer: Self-pay | Admitting: Cardiology

## 2018-12-27 DIAGNOSIS — R300 Dysuria: Secondary | ICD-10-CM | POA: Diagnosis not present

## 2018-12-27 DIAGNOSIS — N3941 Urge incontinence: Secondary | ICD-10-CM | POA: Diagnosis not present

## 2019-01-03 ENCOUNTER — Other Ambulatory Visit: Payer: Self-pay

## 2019-01-03 ENCOUNTER — Ambulatory Visit
Admission: RE | Admit: 2019-01-03 | Discharge: 2019-01-03 | Disposition: A | Discharge status: Home / Self Care | Payer: PPO | Source: Ambulatory Visit | Attending: Family Medicine | Admitting: Family Medicine

## 2019-01-03 DIAGNOSIS — M8589 Other specified disorders of bone density and structure, multiple sites: Secondary | ICD-10-CM | POA: Diagnosis not present

## 2019-01-03 DIAGNOSIS — Z780 Asymptomatic menopausal state: Secondary | ICD-10-CM | POA: Diagnosis not present

## 2019-01-03 DIAGNOSIS — M810 Age-related osteoporosis without current pathological fracture: Secondary | ICD-10-CM

## 2019-01-17 ENCOUNTER — Other Ambulatory Visit: Payer: Self-pay | Admitting: Cardiology

## 2019-01-18 NOTE — Telephone Encounter (Signed)
Please review for refill. Thank you! 

## 2019-02-01 DIAGNOSIS — H18413 Arcus senilis, bilateral: Secondary | ICD-10-CM | POA: Diagnosis not present

## 2019-02-10 DIAGNOSIS — H18413 Arcus senilis, bilateral: Secondary | ICD-10-CM | POA: Diagnosis not present

## 2019-02-10 DIAGNOSIS — H2513 Age-related nuclear cataract, bilateral: Secondary | ICD-10-CM | POA: Diagnosis not present

## 2019-02-10 DIAGNOSIS — H2512 Age-related nuclear cataract, left eye: Secondary | ICD-10-CM | POA: Diagnosis not present

## 2019-02-10 DIAGNOSIS — H25013 Cortical age-related cataract, bilateral: Secondary | ICD-10-CM | POA: Diagnosis not present

## 2019-02-10 DIAGNOSIS — H25043 Posterior subcapsular polar age-related cataract, bilateral: Secondary | ICD-10-CM | POA: Diagnosis not present

## 2019-02-28 ENCOUNTER — Other Ambulatory Visit: Payer: Self-pay | Admitting: Family Medicine

## 2019-02-28 DIAGNOSIS — Z1231 Encounter for screening mammogram for malignant neoplasm of breast: Secondary | ICD-10-CM

## 2019-03-16 DIAGNOSIS — H2512 Age-related nuclear cataract, left eye: Secondary | ICD-10-CM | POA: Diagnosis not present

## 2019-03-16 NOTE — Progress Notes (Signed)
Cardiology Office Note:    Date:  03/17/2019   ID:  Jacqueline Burke, DOB 21-Oct-1935, MRN [id number]  PCP:  Glenis Smoker, MD  Cardiologist:  Fransico Him, MD    Referring MD: No ref. provider found   Chief Complaint  Patient presents with  . Coronary Artery Disease  . Hypertension  . Hyperlipidemia    History of Present Illness:    Jacqueline Burke is a 83 y.o. female with a hx of hypertension, asymptomatic PVCs,ASCAD s/pNSTEMI with peak troponin of 19in setting of acute respiratory failure and E Coli bacteremia in January 2019. 2D echowithnormal LVEF, elevated LVEDP and cath with95% RCA stenosis s/p PCI of the RCA with DES and normal EF, EF 55-65% s/p DES to RCA.  She is here today for followup and is doing well.  She denies any chest pain or pressure, SOB, DOE, PND, orthopnea, LE edema, dizziness, palpitations or syncope. She is compliant with her meds and is tolerating meds with no SE.    Past Medical History:  Diagnosis Date  . Allergic rhinitis   . CAD (coronary artery disease), native coronary artery    NSTEMI with 95% RCA s/p DES to RCA 06/2017  . Diverticulosis   . GERD (gastroesophageal reflux disease)   . Heart murmur    Systolic heart murmur with Aortic valve sclerosis by ECHO  . History of kidney stones   . Hyperlipidemia   . Hypertension   . Osteoarthritis    Erosive OA-MRI of R hand negative for synovitis, only OA-Dr Trudie Reed  . Osteopenia   . Pelvic prolapse   . PVC's (premature ventricular contractions)     Past Surgical History:  Procedure Laterality Date  . BUNIONECTOMY  08/2006   R foot and hammer roe right second toe  . CARDIAC CATHETERIZATION    . CARPAL TUNNEL RELEASE  2002   bil hands  . CORONARY STENT INTERVENTION N/A 06/15/2017   Procedure: CORONARY STENT INTERVENTION;  Surgeon: Lorretta Harp, MD;  Location: [place] [facility];  Service: Cardiovascular;  Laterality: N/A;  . CYSTOSCOPY     with laser lithotripsy Dr. Gloriann Loan 07-20-17    . CYSTOSCOPY W/ URETERAL STENT PLACEMENT Left 06/09/2017   Procedure: CYSTOSCOPY WITH RETROGRADE PYELOGRAM/URETERAL STENT PLACEMENT;  Surgeon: Lucas Mallow, MD;  Location: [facility];  Service: Urology;  Laterality: Left;  . CYSTOSCOPY WITH RETROGRADE PYELOGRAM, URETEROSCOPY AND STENT PLACEMENT Left 07/20/2017   Procedure: CYSTOSCOPY WITH RETROGRADE PYELOGRAM, LEFT URETEROSCOPY HOLMIUM LASER LITHO  AND STENT EXCHANGE;  Surgeon: Lucas Mallow, MD;  Location: [facility];  Service: Urology;  Laterality: Left;  . HOLMIUM LASER APPLICATION Left [id number]   Procedure: HOLMIUM LASER APPLICATION;  Surgeon: Lucas Mallow, MD;  Location: [facility];  Service: Urology;  Laterality: Left;  . LEFT HEART CATH AND CORONARY ANGIOGRAPHY N/A 06/15/2017   Procedure: LEFT HEART CATH AND CORONARY ANGIOGRAPHY;  Surgeon: Lorretta Harp, MD;  Location: [place] [facility];  Service: Cardiovascular;  Laterality: N/A;  . TOTAL KNEE ARTHROPLASTY Left 08/2007  . TOTAL KNEE ARTHROPLASTY Right 03/2010    Current Medications: Current Meds  Medication Sig  . acetaminophen (TYLENOL) 500 MG tablet Take 1,000 mg by mouth every 8 (eight) hours as needed for moderate pain or headache.  Marland Kitchen amitriptyline (ELAVIL) 10 MG tablet Take 20 mg by mouth at bedtime.   Marland Kitchen amLODipine (NORVASC) 2.5 MG tablet Take 3 tablets (7.5 mg total) by mouth daily. Pt needs to make appt  with provider by Oct for further refills  . Ascorbic Acid (VITAMIN C PO) Take 1,000 mg by mouth daily.   Marland Kitchen aspirin EC 81 MG tablet Take 81 mg by mouth daily. On hold for procedure  . atorvastatin (LIPITOR) 80 MG tablet Take 1 tablet (80 mg total) by mouth daily at 6 PM.  . cetirizine (ZYRTEC) 10 MG chewable tablet Chew 10 mg by mouth daily.  . Cholecalciferol (VITAMIN D PO) Take 3,000 Units by mouth daily.   . clopidogrel (PLAVIX) 75 MG tablet Take 1 tablet (75 mg total) by mouth daily. Pt needs to make appt with provider for further refills  . docusate sodium (COLACE)  100 MG capsule Take 200 mg by mouth daily.   Marland Kitchen FIBER PO Take 2 tablets by mouth daily.  . fluticasone (FLONASE) 50 MCG/ACT nasal spray Place 2 sprays into both nostrils daily.  Marland Kitchen gabapentin (NEURONTIN) 100 MG capsule Take 300 mg by mouth daily.   Marland Kitchen HYDROcodone-acetaminophen (NORCO/VICODIN) 5-325 MG tablet Take 1 tablet by mouth every 6 (six) hours as needed for moderate pain.  . metoprolol tartrate (LOPRESSOR) 25 MG tablet Take 1 tablet by mouth twice daily  . Multiple Vitamins-Minerals (ZINC PO) Take 50 mg by mouth daily.  . trospium (SANCTURA) 20 MG tablet Take 60 mg by mouth daily.     Allergies:   Ciprofloxacin, Codeine, Demerol [meperidine], Other, Hydrogen peroxide, Propoxyphene, and Tramadol   Social History   Socioeconomic History  . Marital status: Married    Spouse name: Not on file  . Number of children: Not on file  . Years of education: Not on file  . Highest education level: Not on file  Occupational History  . Occupation: Retired  Scientific laboratory technician  . Financial resource strain: Not on file  . Food insecurity    Worry: Never true    Inability: Never true  . Transportation needs    Medical: No    Non-medical: No  Tobacco Use  . Smoking status: Former Smoker    Packs/day: 1.00    Years: 25.00    Pack years: 25.00    Types: Cigarettes    Quit date: 06/02/1993    Years since quitting: 25.8  . Smokeless tobacco: Never Used  Substance and Sexual Activity  . Alcohol use: No  . Drug use: No  . Sexual activity: Not Currently  Lifestyle  . Physical activity    Days per week: 0 days    Minutes per session: 0 min  . Stress: Only a little  Relationships  . Social Herbalist on phone: Not on file    Gets together: Not on file    Attends religious service: Not on file    Active member of club or organization: Not on file    Attends meetings of clubs or organizations: Not on file    Relationship status: Not on file  Other Topics Concern  . Not on file   Social History Narrative  . Not on file     Family History: The patient's family history includes Breast cancer in her cousin; Diabetes in her mother; Heart attack in her mother; Osteoporosis in her father; Stroke in her mother.  ROS:   Please see the history of present illness.    ROS  All other systems reviewed and negative.   EKGs/Labs/Other Studies Reviewed:    The following studies were reviewed today:   EKG:  EKG is  ordered today.  The ekg ordered today  demonstrates NSR with nonspecific ST abnormality   Recent Labs: No results found for requested labs within last 8760 hours.   Recent Lipid Panel    Component Value Date/Time   CHOL 79 06/16/2017 0535   TRIG 116 06/16/2017 0535   HDL 23 (L) 06/16/2017 0535   CHOLHDL 3.4 06/16/2017 0535   VLDL 23 06/16/2017 0535   LDLCALC 33 06/16/2017 0535    Physical Exam:    VS:  BP (!) 142/70   Pulse 82   Ht 5' (1.524 m)   Wt 167 lb (75.8 kg)   BMI 32.61 kg/m     Wt Readings from Last 3 Encounters:  03/17/19 167 lb (75.8 kg)  03/24/18 162 lb 12.8 oz (73.8 kg)  02/24/18 159 lb 6.4 oz (72.3 kg)     GEN:  Well nourished, well developed in no acute distress HEENT: Normal NECK: No JVD; No carotid bruits LYMPHATICS: No lymphadenopathy CARDIAC: RRR, no murmurs, rubs, gallops RESPIRATORY:  Clear to auscultation without rales, wheezing or rhonchi  ABDOMEN: Soft, non-tender, non-distended MUSCULOSKELETAL:  No edema; No deformity  SKIN: Warm and dry NEUROLOGIC:  Alert and oriented x 3 PSYCHIATRIC:  Normal affect   ASSESSMENT:    1. Coronary artery disease involving native coronary artery of native heart without angina pectoris   2. Benign hypertensive heart disease without heart failure   3. Hyperlipidemia LDL goal <70   4. PVC's (premature ventricular contractions)    PLAN:    In order of problems listed above:  1.  ASCAD S/p NSTEMI with cath showing95% RCA stenosis s/p PCI of the RCA with DES -she denies any  anginal sx -continue ASA, Plavix, BB and staitn  2.  HTN -BP controlled on exam -continue amlodipine 7.5mg  daily, Lopresor 25mg  BID  3.  HLD -LDL goal < 70 -continue atorvastatin 80mg  daily -LDL 70 in April 2020  4.  PVCs -controlled on BB   Medication Adjustments/Labs and Tests Ordered: Current medicines are reviewed at length with the patient today.  Concerns regarding medicines are outlined above.  Orders Placed This Encounter  Procedures  . EKG 12-Lead   No orders of the defined types were placed in this encounter.   Signed, Fransico Him, MD  03/17/2019 10:52 AM    [place]

## 2019-03-17 ENCOUNTER — Other Ambulatory Visit: Payer: Self-pay

## 2019-03-17 ENCOUNTER — Ambulatory Visit: Payer: PPO | Admitting: Cardiology

## 2019-03-17 ENCOUNTER — Encounter: Payer: Self-pay | Admitting: Cardiology

## 2019-03-17 VITALS — BP 142/70 | HR 82 | Ht 60.0 in | Wt 167.0 lb

## 2019-03-17 DIAGNOSIS — I493 Ventricular premature depolarization: Secondary | ICD-10-CM

## 2019-03-17 DIAGNOSIS — E785 Hyperlipidemia, unspecified: Secondary | ICD-10-CM

## 2019-03-17 DIAGNOSIS — I119 Hypertensive heart disease without heart failure: Secondary | ICD-10-CM | POA: Diagnosis not present

## 2019-03-17 DIAGNOSIS — H25011 Cortical age-related cataract, right eye: Secondary | ICD-10-CM | POA: Diagnosis not present

## 2019-03-17 DIAGNOSIS — I2510 Atherosclerotic heart disease of native coronary artery without angina pectoris: Secondary | ICD-10-CM

## 2019-03-17 DIAGNOSIS — H25041 Posterior subcapsular polar age-related cataract, right eye: Secondary | ICD-10-CM | POA: Diagnosis not present

## 2019-03-17 DIAGNOSIS — H2511 Age-related nuclear cataract, right eye: Secondary | ICD-10-CM | POA: Diagnosis not present

## 2019-03-17 MED ORDER — AMLODIPINE BESYLATE 5 MG PO TABS: 7.5000 mg | ORAL_TABLET | Freq: Every day | ORAL | 3 refills | Status: DC

## 2019-03-17 NOTE — Patient Instructions (Signed)

## 2019-03-28 DIAGNOSIS — I10 Essential (primary) hypertension: Secondary | ICD-10-CM | POA: Diagnosis not present

## 2019-03-28 DIAGNOSIS — N183 Chronic kidney disease, stage 3 unspecified: Secondary | ICD-10-CM | POA: Diagnosis not present

## 2019-03-28 DIAGNOSIS — M8949 Other hypertrophic osteoarthropathy, multiple sites: Secondary | ICD-10-CM | POA: Diagnosis not present

## 2019-03-28 DIAGNOSIS — Z23 Encounter for immunization: Secondary | ICD-10-CM | POA: Diagnosis not present

## 2019-03-28 DIAGNOSIS — K5900 Constipation, unspecified: Secondary | ICD-10-CM | POA: Diagnosis not present

## 2019-03-28 DIAGNOSIS — N1832 Chronic kidney disease, stage 3b: Secondary | ICD-10-CM | POA: Diagnosis not present

## 2019-03-30 DIAGNOSIS — H2511 Age-related nuclear cataract, right eye: Secondary | ICD-10-CM | POA: Diagnosis not present

## 2019-03-31 DIAGNOSIS — H2511 Age-related nuclear cataract, right eye: Secondary | ICD-10-CM | POA: Diagnosis not present

## 2019-04-13 ENCOUNTER — Other Ambulatory Visit: Payer: Self-pay

## 2019-04-13 ENCOUNTER — Ambulatory Visit
Admission: RE | Admit: 2019-04-13 | Discharge: 2019-04-13 | Disposition: A | Discharge status: Home / Self Care | Payer: PPO | Source: Ambulatory Visit | Attending: Family Medicine | Admitting: Family Medicine

## 2019-04-13 DIAGNOSIS — Z1231 Encounter for screening mammogram for malignant neoplasm of breast: Secondary | ICD-10-CM | POA: Diagnosis not present

## 2019-04-14 ENCOUNTER — Other Ambulatory Visit: Payer: Self-pay | Admitting: *Deleted

## 2019-05-11 DIAGNOSIS — M154 Erosive (osteo)arthritis: Secondary | ICD-10-CM | POA: Diagnosis not present

## 2019-05-11 DIAGNOSIS — M545 Low back pain: Secondary | ICD-10-CM | POA: Diagnosis not present

## 2019-05-11 DIAGNOSIS — M2550 Pain in unspecified joint: Secondary | ICD-10-CM | POA: Diagnosis not present

## 2019-05-11 DIAGNOSIS — M150 Primary generalized (osteo)arthritis: Secondary | ICD-10-CM | POA: Diagnosis not present

## 2019-06-27 ENCOUNTER — Other Ambulatory Visit: Payer: Self-pay | Admitting: Cardiology

## 2019-07-04 ENCOUNTER — Other Ambulatory Visit: Payer: Self-pay | Admitting: Cardiology

## 2019-07-24 ENCOUNTER — Ambulatory Visit: Payer: PPO | Attending: Internal Medicine

## 2019-07-24 DIAGNOSIS — Z23 Encounter for immunization: Secondary | ICD-10-CM | POA: Insufficient documentation

## 2019-07-24 NOTE — Progress Notes (Signed)
   Covid-19 Vaccination Clinic  Name:  Jacqueline Burke    MRN: [id number] DOB: 06-05-1935  07/24/2019  [name] was observed post Covid-19 immunization for 15 minutes without incidence. She was provided with Vaccine Information Sheet and instruction to access the V-Safe system.   [name] was instructed to call 911 with any severe reactions post vaccine: Marland Kitchen Difficulty breathing  . Swelling of your face and throat  . A fast heartbeat  . A bad rash all over your body  . Dizziness and weakness    Immunizations Administered    Name Date Dose VIS Date Route   Pfizer COVID-19 Vaccine 07/24/2019  8:32 AM 0.3 mL 05/13/2019 Intramuscular   Manufacturer: [place]   Lot: [id number]   [place]: [id number]

## 2019-08-16 ENCOUNTER — Ambulatory Visit: Payer: PPO | Attending: Internal Medicine

## 2019-08-16 DIAGNOSIS — Z23 Encounter for immunization: Secondary | ICD-10-CM

## 2019-08-16 NOTE — Progress Notes (Signed)
   Covid-19 Vaccination Clinic  Name:  Jacqueline Burke    MRN: [id number] DOB: 1936-01-24  08/16/2019  [name] was observed post Covid-19 immunization for 15 minutes without incident. She was provided with Vaccine Information Sheet and instruction to access the V-Safe system.   [name] was instructed to call 911 with any severe reactions post vaccine: Marland Kitchen Difficulty breathing  . Swelling of face and throat  . A fast heartbeat  . A bad rash all over body  . Dizziness and weakness   Immunizations Administered    Name Date Dose VIS Date Route   Pfizer COVID-19 Vaccine 08/16/2019  3:05 PM 0.3 mL 05/13/2019 Intramuscular   Manufacturer: [place]   Lot: [id number]   [place]: [id number]

## 2019-09-29 DIAGNOSIS — I700 Atherosclerosis of aorta: Secondary | ICD-10-CM | POA: Diagnosis not present

## 2019-09-29 DIAGNOSIS — E559 Vitamin D deficiency, unspecified: Secondary | ICD-10-CM | POA: Diagnosis not present

## 2019-09-29 DIAGNOSIS — Z0000 Encounter for general adult medical examination without abnormal findings: Secondary | ICD-10-CM | POA: Diagnosis not present

## 2019-09-29 DIAGNOSIS — E7800 Pure hypercholesterolemia, unspecified: Secondary | ICD-10-CM | POA: Diagnosis not present

## 2019-09-29 DIAGNOSIS — I10 Essential (primary) hypertension: Secondary | ICD-10-CM | POA: Diagnosis not present

## 2019-09-29 DIAGNOSIS — L821 Other seborrheic keratosis: Secondary | ICD-10-CM | POA: Diagnosis not present

## 2019-09-29 DIAGNOSIS — N183 Chronic kidney disease, stage 3 unspecified: Secondary | ICD-10-CM | POA: Diagnosis not present

## 2019-09-29 DIAGNOSIS — K5900 Constipation, unspecified: Secondary | ICD-10-CM | POA: Diagnosis not present

## 2019-09-29 DIAGNOSIS — M8949 Other hypertrophic osteoarthropathy, multiple sites: Secondary | ICD-10-CM | POA: Diagnosis not present

## 2019-11-01 DIAGNOSIS — R110 Nausea: Secondary | ICD-10-CM | POA: Diagnosis not present

## 2019-11-01 DIAGNOSIS — R05 Cough: Secondary | ICD-10-CM | POA: Diagnosis not present

## 2019-11-01 DIAGNOSIS — Z1152 Encounter for screening for COVID-19: Secondary | ICD-10-CM | POA: Diagnosis not present

## 2019-11-01 DIAGNOSIS — J029 Acute pharyngitis, unspecified: Secondary | ICD-10-CM | POA: Diagnosis not present

## 2019-11-09 DIAGNOSIS — U071 COVID-19: Secondary | ICD-10-CM | POA: Diagnosis not present

## 2019-11-09 DIAGNOSIS — I10 Essential (primary) hypertension: Secondary | ICD-10-CM | POA: Diagnosis not present

## 2019-11-18 ENCOUNTER — Ambulatory Visit
Admission: RE | Admit: 2019-11-18 | Discharge: 2019-11-18 | Disposition: A | Discharge status: Home / Self Care | Payer: PPO | Source: Ambulatory Visit | Attending: Family Medicine | Admitting: Family Medicine

## 2019-11-18 ENCOUNTER — Other Ambulatory Visit: Payer: Self-pay | Admitting: Family Medicine

## 2019-11-18 DIAGNOSIS — U071 COVID-19: Secondary | ICD-10-CM | POA: Diagnosis not present

## 2019-11-18 DIAGNOSIS — J939 Pneumothorax, unspecified: Secondary | ICD-10-CM | POA: Diagnosis not present

## 2019-11-18 DIAGNOSIS — R05 Cough: Secondary | ICD-10-CM | POA: Diagnosis not present

## 2019-11-18 DIAGNOSIS — R059 Cough, unspecified: Secondary | ICD-10-CM

## 2019-11-18 DIAGNOSIS — R0602 Shortness of breath: Secondary | ICD-10-CM | POA: Diagnosis not present

## 2019-11-27 IMAGING — CT CT RENAL STONE PROTOCOL
2 of 4 series · 15 of 46 positions shown, 17 images · non-contrast
Comparison: 04/03/2010

CLINICAL DATA: Right-sided abdominal pain radiates to the back.

EXAM:
CT ABDOMEN AND PELVIS WITHOUT CONTRAST
TECHNIQUE: Multidetector CT imaging of the abdomen and pelvis was performed
following the standard protocol without IV contrast.

[Series 3: ap without · axial · non-contrast · 0.74mm/px · z∈[-60,+334]mm · 12 of 89 slices shown, 14 images]
[im 5/89  soft-tissue]
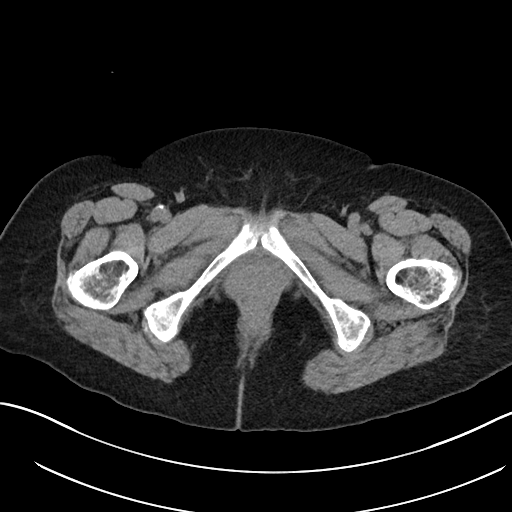
[im 5/89  bone]
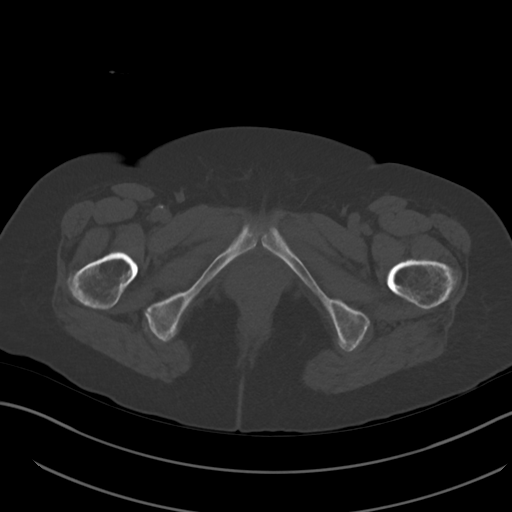
[im 15/89  soft-tissue]
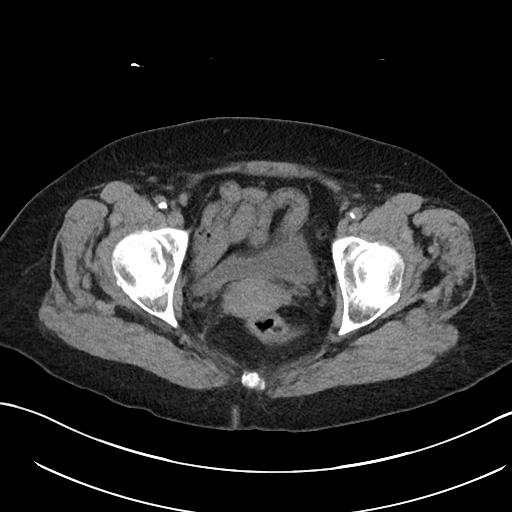
[im 20/89  soft-tissue]
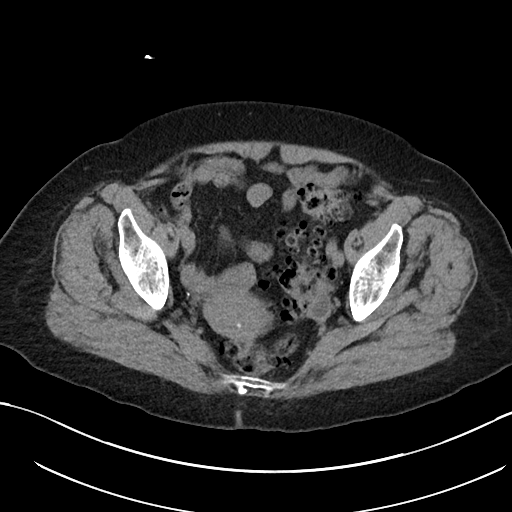
[im 25/89  soft-tissue]
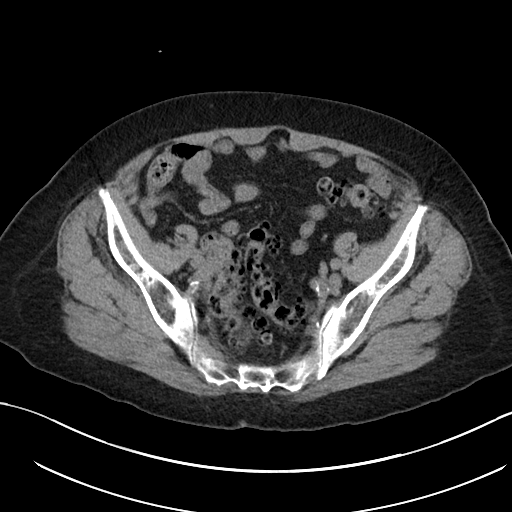
[im 35/89  soft-tissue]
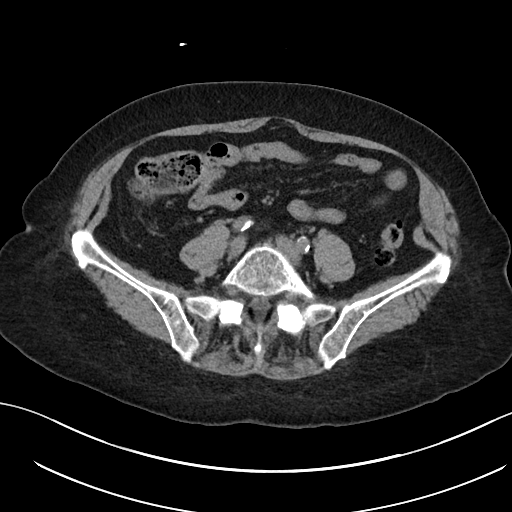
[im 40/89  soft-tissue]
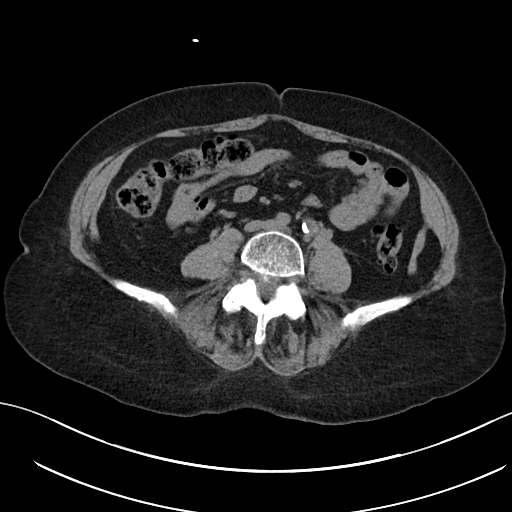
[im 49/89  soft-tissue]
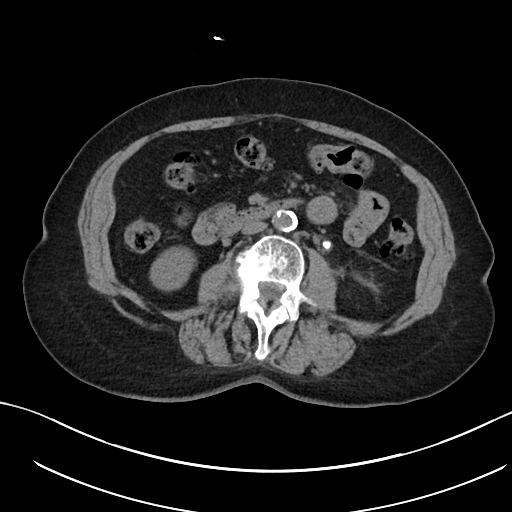
[im 54/89  soft-tissue]
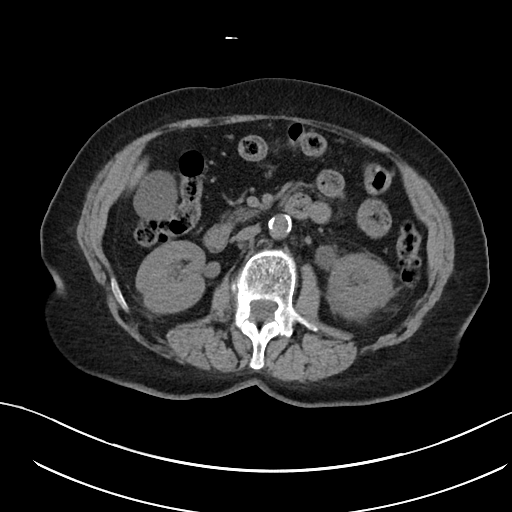
[im 64/89  soft-tissue]
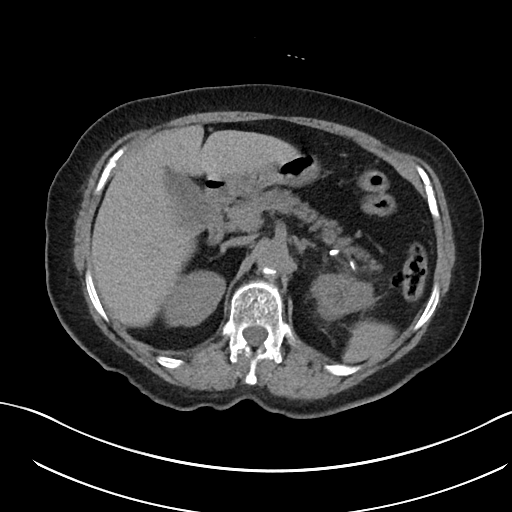
[im 64/89  bone]
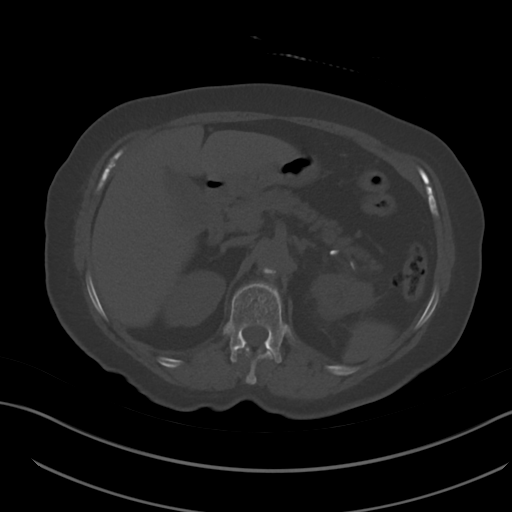
[im 69/89  soft-tissue]
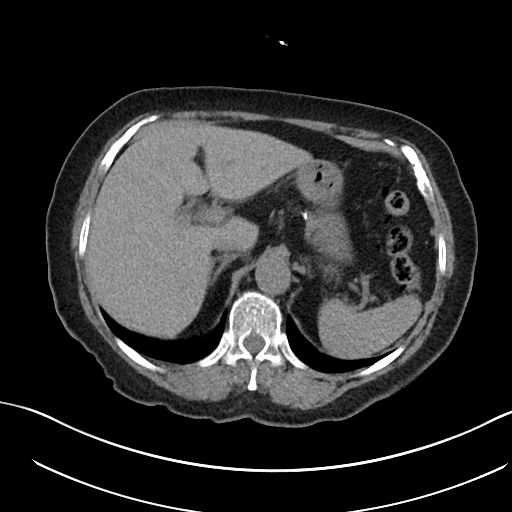
[im 74/89  soft-tissue]
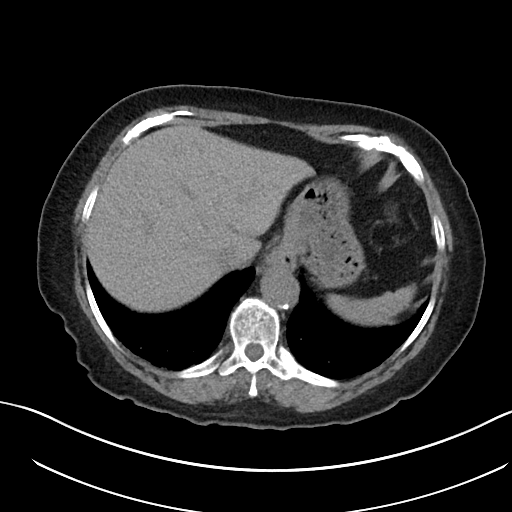
[im 84/89  soft-tissue]
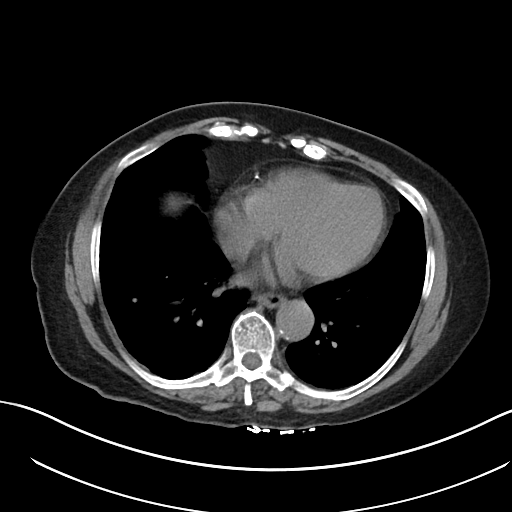

[Series 6: cor · coronal · 0.69mm/px · 3 of 73 slices shown]
[im 25/73  soft-tissue]
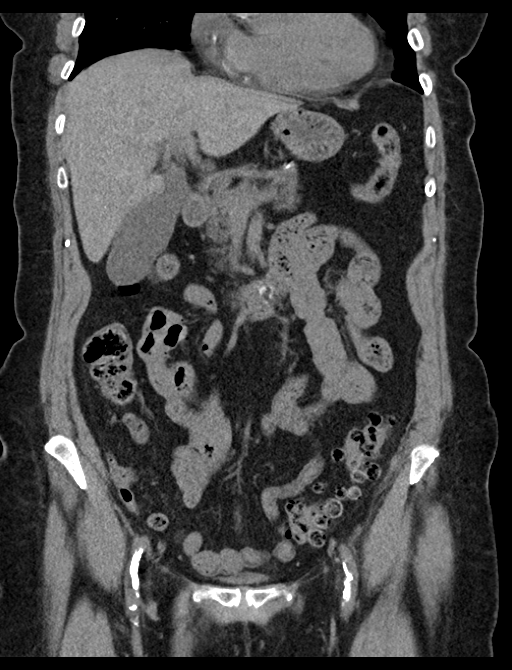
[im 33/73  soft-tissue]
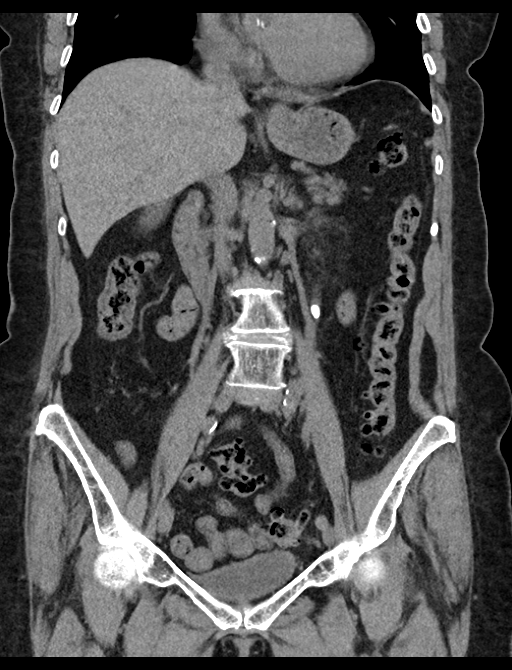
[im 41/73  soft-tissue]
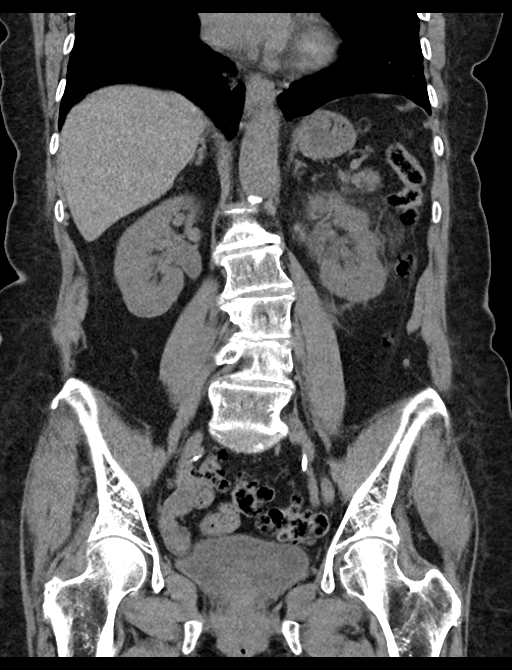

[15 of 46 positions shown; findings below may reference images not displayed]

FINDINGS: Lower chest:  No acute findings.

Hepatobiliary: No focal abnormality in the liver on this study
without intravenous contrast. There is no evidence for gallstones,
gallbladder wall thickening, or pericholecystic fluid. No
intrahepatic or extrahepatic biliary dilation.

Pancreas: No focal mass lesion. No dilatation of the main duct. No
intraparenchymal cyst. No peripancreatic edema.

Spleen: No splenomegaly. No focal mass lesion.

Adrenals/Urinary Tract: No adrenal nodule or mass. 2 mm
nonobstructing stone identified interpolar right kidney. No right
ureteral stone. 7 x 6 x 10 mm stone is identified in the proximal
left ureter causing mild left hydronephrosis with perinephric edema.
No other left ureteral stone. No bladder stone.

Stomach/Bowel: Small hiatal hernia. Stomach is nondistended. No
gastric wall thickening. No evidence of outlet obstruction. Duodenum
is normally positioned as is the ligament of Treitz. No small bowel
wall thickening. No small bowel dilatation. The appendix is normal.
No gross colonic mass. No colonic wall thickening. Diffuse colonic
diverticulosis is most advanced in the left colon. No evidence for
diverticulitis.

Vascular/Lymphatic: There is abdominal aortic atherosclerosis
without aneurysm. There is no gastrohepatic or hepatoduodenal
ligament lymphadenopathy. No intraperitoneal or retroperitoneal
lymphadenopathy. No pelvic sidewall lymphadenopathy.

Reproductive: Uterus unremarkable. There is no adnexal mass. 18 mm
cystic focus in the left ovary was 3.4 cm on the prior study.

Other: No intraperitoneal free fluid.

Musculoskeletal: Degenerative changes noted lumbar spine with
left-sided pars interarticularis defect at L5.
IMPRESSION: 1. 7 x 6 x 10 mm stone in the proximal left ureter causes mild left
hydronephrosis and prominent left perinephric edema.
2. Tiny nonobstructing stone right kidney.
3. Diffuse colonic diverticulosis without diverticulitis.
4.  Aortic Atherosclerois ([id number]-[id number])

## 2019-11-28 DIAGNOSIS — C44319 Basal cell carcinoma of skin of other parts of face: Secondary | ICD-10-CM | POA: Diagnosis not present

## 2019-11-28 DIAGNOSIS — L821 Other seborrheic keratosis: Secondary | ICD-10-CM | POA: Diagnosis not present

## 2019-11-28 DIAGNOSIS — Z85828 Personal history of other malignant neoplasm of skin: Secondary | ICD-10-CM | POA: Diagnosis not present

## 2019-11-28 DIAGNOSIS — C4441 Basal cell carcinoma of skin of scalp and neck: Secondary | ICD-10-CM | POA: Diagnosis not present

## 2019-11-29 IMAGING — DX DG ABDOMEN 1V
1 series · 1 of 1 positions shown · non-contrast
Comparison: Abdominal CT 06/07/2017

CLINICAL DATA: Encounter for orogastric tube

EXAM:
ABDOMEN - 1 VIEW

[abdomen kub]
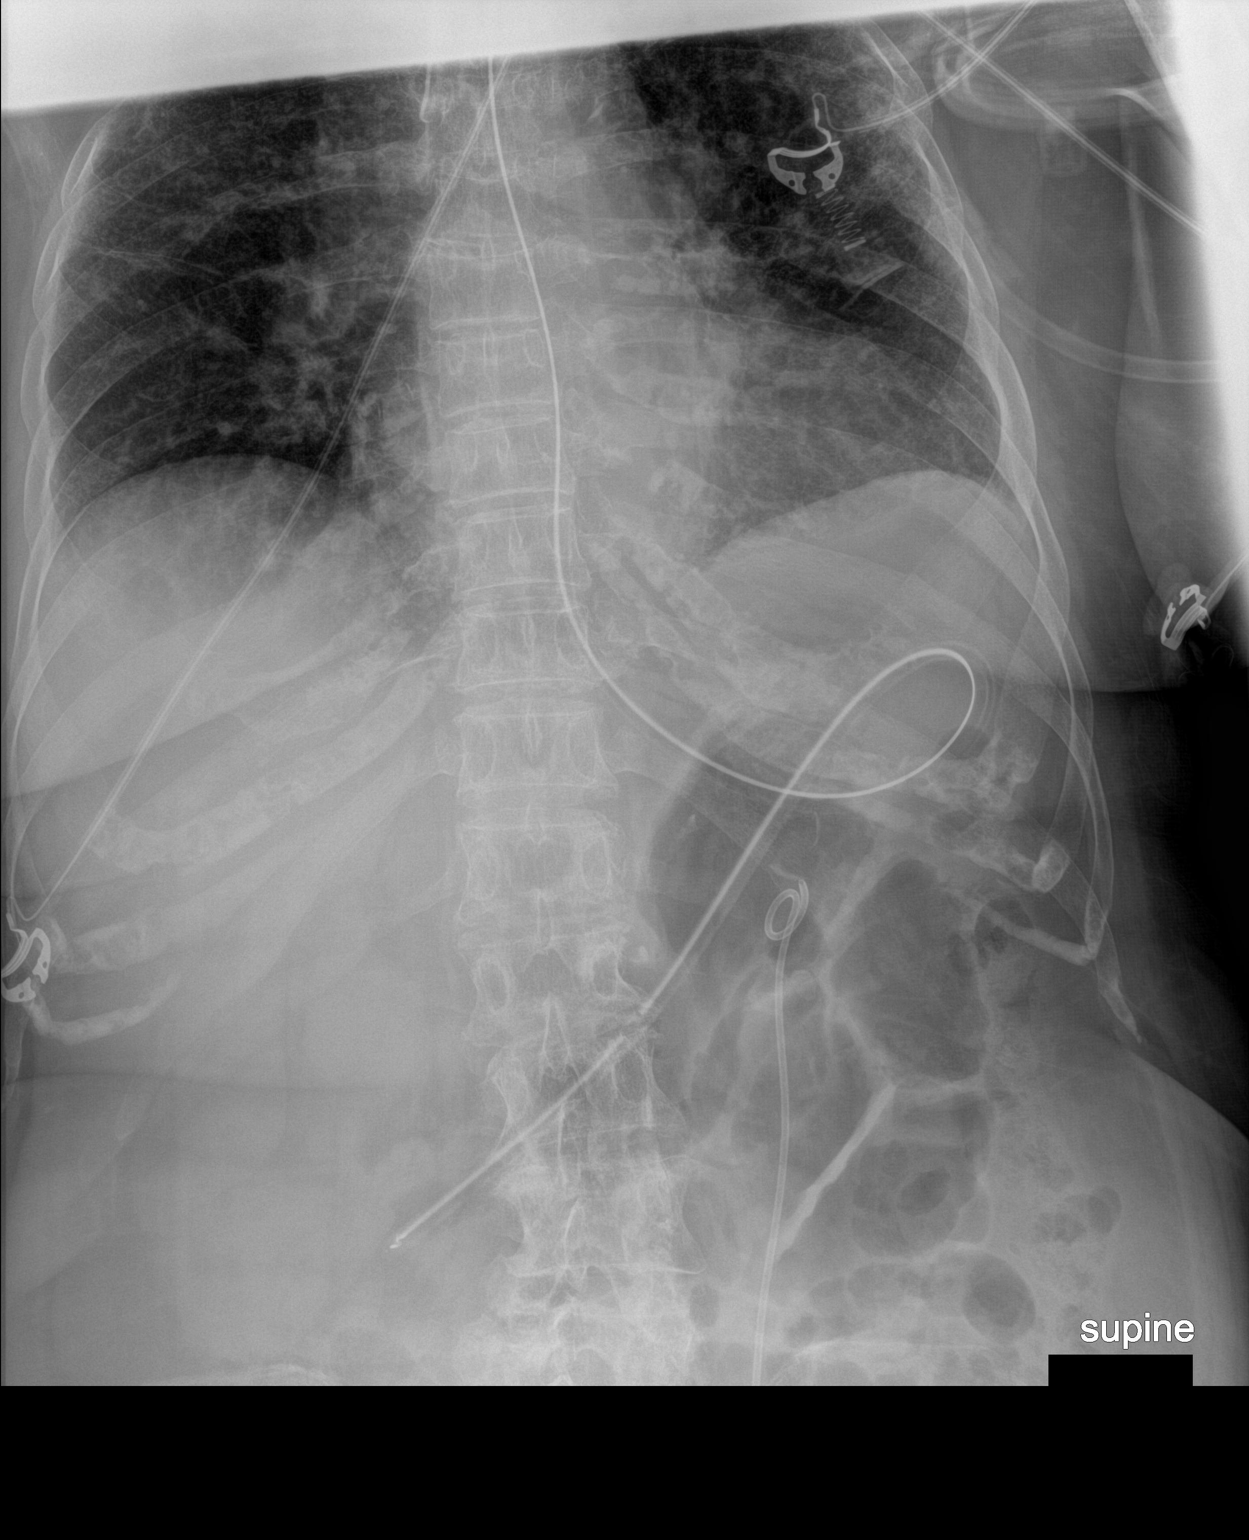

[1 of 1 positions shown; findings below may reference images not displayed]

FINDINGS: An orogastric tube is coiled in the fundus with tip at the antrum.
Left ureteral stent with visualized portion in expected location.
Left nephrolithiasis measuring 1 cm. Symmetric lung aeration with
nonspecific bilateral reticulation.
IMPRESSION: Orogastric tube with tip at the distal stomach.

## 2019-11-30 IMAGING — DX DG CHEST 1V PORT
1 series · 1 of 1 positions shown · non-contrast
Comparison: 06/19/2017.

CLINICAL DATA: Respiratory failure.

EXAM:
PORTABLE CHEST 1 VIEW

[chest ap]
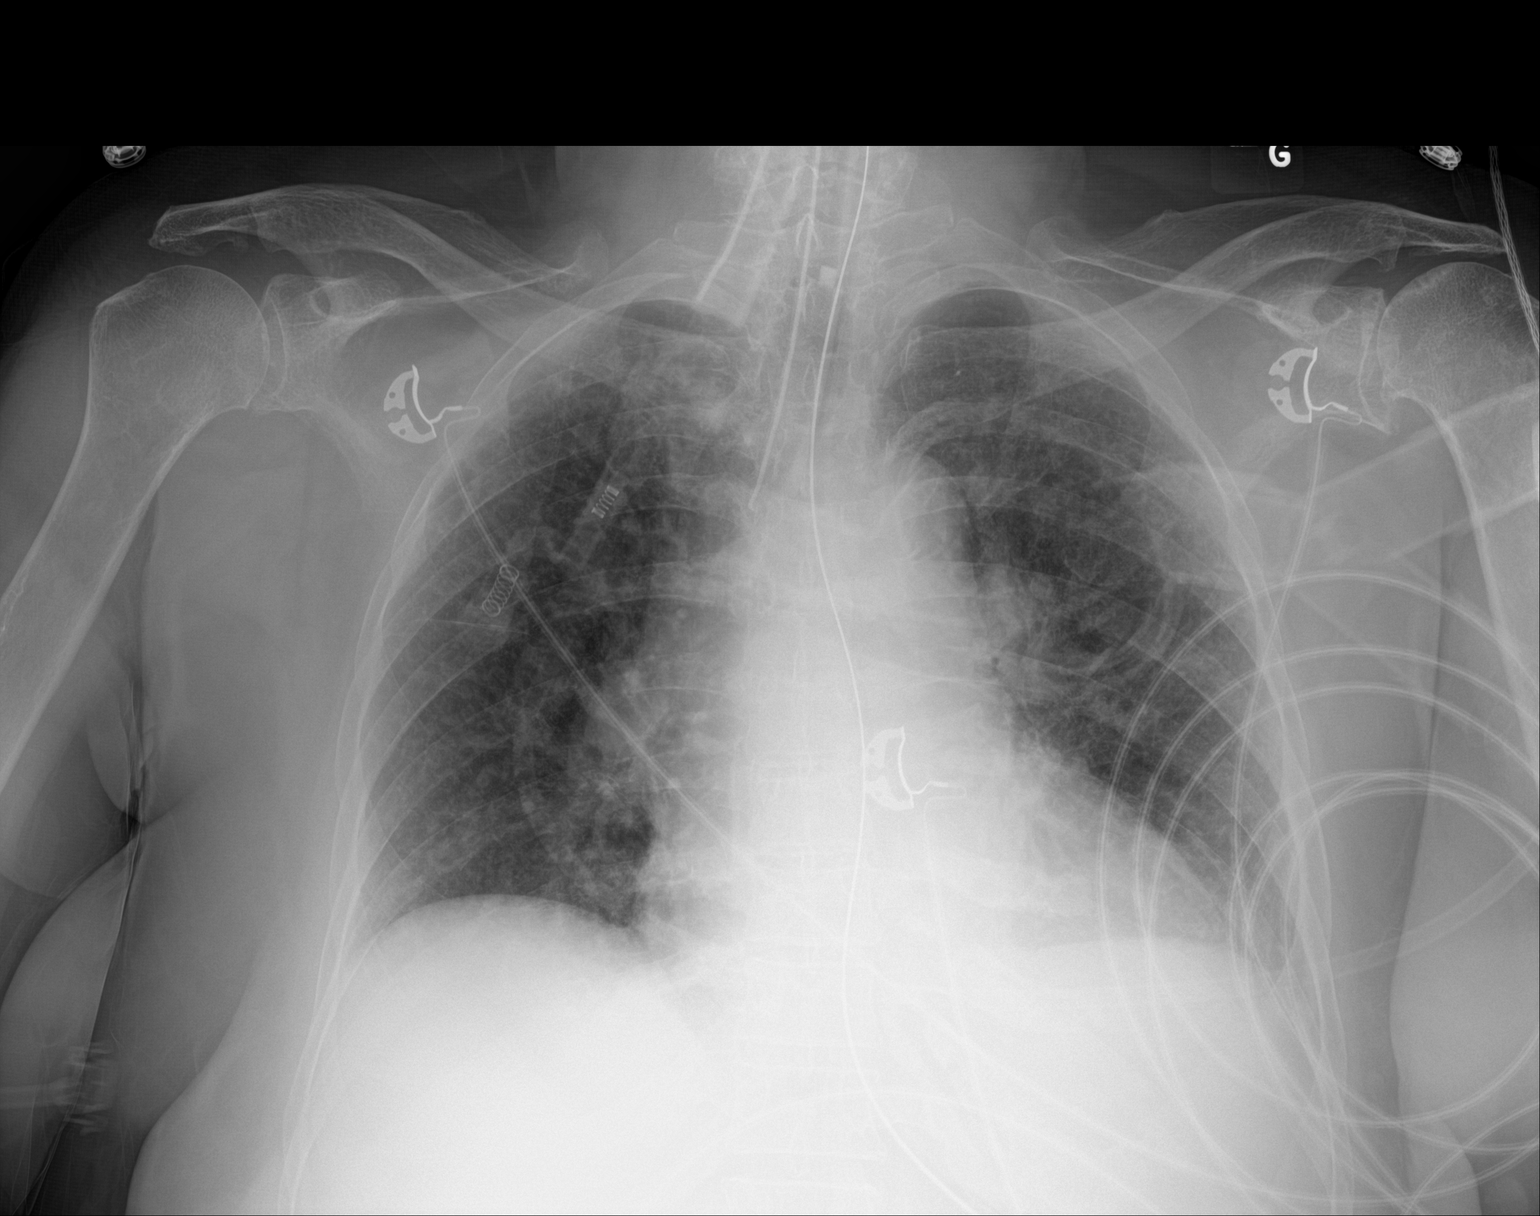

[1 of 1 positions shown; findings below may reference images not displayed]

FINDINGS: Low lung volumes. Unchanged ETT 2.6 cm above carina. Cardiac
enlargement. Mild vascular congestion. No pneumothorax.
IMPRESSION: Similar appearance to priors. Mild vascular congestion. Stable ETT.

## 2019-12-06 DIAGNOSIS — M545 Low back pain: Secondary | ICD-10-CM | POA: Diagnosis not present

## 2019-12-06 DIAGNOSIS — M2550 Pain in unspecified joint: Secondary | ICD-10-CM | POA: Diagnosis not present

## 2019-12-06 DIAGNOSIS — Z6829 Body mass index (BMI) 29.0-29.9, adult: Secondary | ICD-10-CM | POA: Diagnosis not present

## 2019-12-06 DIAGNOSIS — M154 Erosive (osteo)arthritis: Secondary | ICD-10-CM | POA: Diagnosis not present

## 2019-12-06 DIAGNOSIS — E663 Overweight: Secondary | ICD-10-CM | POA: Diagnosis not present

## 2019-12-06 DIAGNOSIS — M150 Primary generalized (osteo)arthritis: Secondary | ICD-10-CM | POA: Diagnosis not present

## 2019-12-22 DIAGNOSIS — Z85828 Personal history of other malignant neoplasm of skin: Secondary | ICD-10-CM | POA: Diagnosis not present

## 2019-12-22 DIAGNOSIS — C4441 Basal cell carcinoma of skin of scalp and neck: Secondary | ICD-10-CM | POA: Diagnosis not present

## 2020-02-08 ENCOUNTER — Ambulatory Visit
Admission: RE | Admit: 2020-02-08 | Discharge: 2020-02-08 | Disposition: A | Discharge status: Home / Self Care | Payer: PPO | Source: Ambulatory Visit | Attending: Family Medicine | Admitting: Family Medicine

## 2020-02-08 ENCOUNTER — Other Ambulatory Visit: Payer: Self-pay | Admitting: Family Medicine

## 2020-02-08 DIAGNOSIS — K219 Gastro-esophageal reflux disease without esophagitis: Secondary | ICD-10-CM | POA: Diagnosis not present

## 2020-02-08 DIAGNOSIS — R0602 Shortness of breath: Secondary | ICD-10-CM | POA: Diagnosis not present

## 2020-02-08 DIAGNOSIS — R918 Other nonspecific abnormal finding of lung field: Secondary | ICD-10-CM

## 2020-03-20 ENCOUNTER — Encounter: Payer: Self-pay | Admitting: Emergency Medicine

## 2020-03-20 ENCOUNTER — Ambulatory Visit: Payer: PPO | Admitting: Emergency Medicine

## 2020-03-20 ENCOUNTER — Other Ambulatory Visit: Payer: Self-pay

## 2020-03-20 VITALS — BP 140/72 | HR 79 | Temp 97.7°F | Ht 61.0 in | Wt 158.6 lb

## 2020-03-20 DIAGNOSIS — R059 Cough, unspecified: Secondary | ICD-10-CM

## 2020-03-20 DIAGNOSIS — J849 Interstitial pulmonary disease, unspecified: Secondary | ICD-10-CM

## 2020-03-20 NOTE — Assessment & Plan Note (Signed)
Purulent cough.  She has difficult time telling me the duration but several months.  Question whether she has evolved some bronchiectatic change in the setting of her previously identified mild ILD/NSIP.  This could have been exacerbated by her recent COVID-19 pneumonia.  She is now dealing with purulent sputum, has focal crackles on the right.  She needs a repeat high-res CT scan of the chest to evaluate further both her ILD and for bronchiectasis.  We will repeat her pulmonary function testing to compare with priors.

## 2020-03-20 NOTE — Patient Instructions (Signed)
We will repeat your high-resolution CT scan of the chest to compare with your prior We will repeat your pulmonary function testing Follow with Dr. Lamonte Sakai next available after your testing so that we can review the results together.

## 2020-03-20 NOTE — Addendum Note (Signed)
Addended by: Gavin Potters R on: 03/20/2020 03:39 PM   Modules accepted: Orders

## 2020-03-20 NOTE — Progress Notes (Addendum)
Subjective:    Patient ID: Jacqueline Burke, female    DOB: [birth year], 84 y.o.   MRN: [id number]  HPI 84 year old former smoker (25 pack years) with a history of coronary disease/PTCI,, allergic rhinitis, hypertension, hyperlipidemia. I last saw her in 2015 for coughing and shortness of breath. She had mild obstruction with a decreased diffusion capacity on pulmonary function testing 11/18/2013. We also performed high-resolution CT scan of her chest 11/16/2013 that showed changes consistent with COPD some nonspecific interstitial prominence without any evidence of frank honeycomb change, question NSIP.  She had COVID-19 in June 2021, was treated as an outpatient. She does feel back up to sped now in the aftrermath of that illness.   She returns today with an increase in her cough and also more purulent sputum - has been green to yellow. This has been happening for at least months, maybe a year. Her stamina is still OK - she can shop, do her housework, doesn't feel that she has been slowed down. She does stop to rest occasionally. She was treated w abx x 2 this year. She feels "pressure" on her flanks, R>L, is present all the time.    Review of Systems As per HPI  Past Medical History:  Diagnosis Date  . Allergic rhinitis   . CAD (coronary artery disease), native coronary artery    NSTEMI with 95% RCA s/p DES to RCA 06/2017  . Diverticulosis   . GERD (gastroesophageal reflux disease)   . Heart murmur    Systolic heart murmur with Aortic valve sclerosis by ECHO  . History of kidney stones   . Hyperlipidemia   . Hypertension   . Osteoarthritis    Erosive OA-MRI of R hand negative for synovitis, only OA-Dr Trudie Reed  . Osteopenia   . Pelvic prolapse   . PVC's (premature ventricular contractions)      Family History  Problem Relation Age of Onset  . Stroke Mother   . Diabetes Mother   . Heart attack Mother   . Osteoporosis Father   . Breast cancer Cousin      Social History    Socioeconomic History  . Marital status: Married    Spouse name: Not on file  . Number of children: Not on file  . Years of education: Not on file  . Highest education level: Not on file  Occupational History  . Occupation: Retired  Tobacco Use  . Smoking status: Former Smoker    Packs/day: 1.00    Years: 25.00    Pack years: 25.00    Types: Cigarettes    Quit date: 06/02/1993    Years since quitting: 26.8  . Smokeless tobacco: Never Used  Vaping Use  . Vaping Use: Never used  Substance and Sexual Activity  . Alcohol use: No  . Drug use: No  . Sexual activity: Not Currently  Other Topics Concern  . Not on file  Social History Narrative  . Not on file   Social Determinants of Health   Financial Resource Strain:   . Difficulty of Paying Living Expenses: Not on file  Food Insecurity:   . Worried About Charity fundraiser in the Last Year: Not on file  . Ran Out of Food in the Last Year: Not on file  Transportation Needs:   . Lack of Transportation (Medical): Not on file  . Lack of Transportation (Non-Medical): Not on file  Physical Activity:   . Days of Exercise per Week: Not on file  .  Minutes of Exercise per Session: Not on file  Stress:   . Feeling of Stress : Not on file  Social Connections:   . Frequency of Communication with Friends and Family: Not on file  . Frequency of Social Gatherings with Friends and Family: Not on file  . Attends Religious Services: Not on file  . Active Member of Clubs or Organizations: Not on file  . Attends Archivist Meetings: Not on file  . Marital Status: Not on file  Intimate Partner Violence:   . Fear of Current or Ex-Partner: Not on file  . Emotionally Abused: Not on file  . Physically Abused: Not on file  . Sexually Abused: Not on file     Allergies  Allergen Reactions  . Ciprofloxacin Hives  . Codeine Hives  . Demerol [Meperidine] Hives  . Other Hives    Hydrogen peroxide Hydrogen peroxide  . Hydrogen  Peroxide Other (See Comments)    White blisters  . Propoxyphene Other (See Comments)    Unknown Other reaction(s): OTHER  . Tramadol Other (See Comments)    "ITCHY ON THE INSIDE"     Outpatient Medications Prior to Visit  Medication Sig Dispense Refill  . acetaminophen (TYLENOL) 500 MG tablet Take 1,000 mg by mouth every 8 (eight) hours as needed for moderate pain or headache.    Marland Kitchen amitriptyline (ELAVIL) 10 MG tablet Take 20 mg by mouth at bedtime.     Marland Kitchen amLODipine (NORVASC) 5 MG tablet Take 1.5 tablets (7.5 mg total) by mouth daily. 135 tablet 3  . Ascorbic Acid (VITAMIN C PO) Take 1,000 mg by mouth daily.     Marland Kitchen aspirin EC 81 MG tablet Take 81 mg by mouth daily. On hold for procedure    . atorvastatin (LIPITOR) 80 MG tablet Take 1 tablet (80 mg total) by mouth daily at 6 PM. 30 tablet 0  . cetirizine (ZYRTEC) 10 MG chewable tablet Chew 10 mg by mouth daily.    . Cholecalciferol (VITAMIN D PO) Take 3,000 Units by mouth daily.     Marland Kitchen docusate sodium (COLACE) 100 MG capsule Take 200 mg by mouth daily.     Marland Kitchen FIBER PO Take 2 tablets by mouth daily.    . fluticasone (FLONASE) 50 MCG/ACT nasal spray Place 2 sprays into both nostrils daily. 16 g 2  . gabapentin (NEURONTIN) 100 MG capsule Take 300 mg by mouth daily.     Marland Kitchen HYDROcodone-acetaminophen (NORCO/VICODIN) 5-325 MG tablet Take 1 tablet by mouth every 6 (six) hours as needed for moderate pain. 30 tablet 0  . metoprolol tartrate (LOPRESSOR) 25 MG tablet Take 1 tablet by mouth twice daily 180 tablet 2  . Multiple Vitamins-Minerals (ZINC PO) Take 50 mg by mouth daily.    Marland Kitchen omeprazole (PRILOSEC) 20 MG capsule Take 20 mg by mouth daily.    . trospium (SANCTURA) 20 MG tablet Take 60 mg by mouth daily.    . clopidogrel (PLAVIX) 75 MG tablet TAKE 1 TABLET BY MOUTH ONCE DAILY **NEED  OFFICE  VISIT  FOR  FURTHER  REFILLS** (Patient not taking: Reported on 03/20/2020) 90 tablet 2   No facility-administered medications prior to visit.         Objective:   Physical Exam Vitals:   03/20/20 1429  BP: 140/72  Pulse: 79  Temp: 97.7 F (36.5 C)  TempSrc: Temporal  SpO2: 96%  Weight: 158 lb 9.6 oz (71.9 kg)  Height: 5\' 1"  (1.549 m)   Gen: Pleasant,  elderly woman, in no distress, slightly kyphotic, normal affect  ENT: No lesions,  mouth clear,  oropharynx clear, no postnasal drip  Neck: No JVD, no stridor  Lungs: No use of accessory muscles, no wheeze.  She does have focal right basilar inspiratory crackles, clear on the left  Cardiovascular: RRR, systolic M 3/6, intact s2, ? S4  Musculoskeletal: No deformities, no cyanosis or clubbing  Neuro: alert, awake, non focal  Skin: Warm, no lesions or rash      Assessment & Plan:  Cough Purulent cough.  She has difficult time telling me the duration but several months.  Question whether she has evolved some bronchiectatic change in the setting of her previously identified mild ILD/NSIP.  This could have been exacerbated by her recent COVID-19 pneumonia.  She is now dealing with purulent sputum, has focal crackles on the right.  She needs a repeat high-res CT scan of the chest to evaluate further both her ILD and for bronchiectasis.  We will repeat her pulmonary function testing to compare with priors.  Baltazar Apo, MD, PhD 03/20/2020, 3:15 PM [facility] [phone] or if no answer [phone]

## 2020-03-22 ENCOUNTER — Ambulatory Visit (INDEPENDENT_AMBULATORY_CARE_PROVIDER_SITE_OTHER)
Admission: RE | Admit: 2020-03-22 | Discharge: 2020-03-22 | Disposition: A | Discharge status: Home / Self Care | Payer: PPO | Source: Ambulatory Visit | Attending: Emergency Medicine | Admitting: Emergency Medicine

## 2020-03-22 ENCOUNTER — Other Ambulatory Visit: Payer: Self-pay

## 2020-03-22 ENCOUNTER — Ambulatory Visit: Payer: PPO | Admitting: Cardiology

## 2020-03-22 ENCOUNTER — Encounter: Payer: Self-pay | Admitting: Cardiology

## 2020-03-22 VITALS — BP 130/60 | HR 54 | Ht 61.0 in | Wt 158.6 lb

## 2020-03-22 DIAGNOSIS — I2510 Atherosclerotic heart disease of native coronary artery without angina pectoris: Secondary | ICD-10-CM

## 2020-03-22 DIAGNOSIS — E785 Hyperlipidemia, unspecified: Secondary | ICD-10-CM

## 2020-03-22 DIAGNOSIS — J849 Interstitial pulmonary disease, unspecified: Secondary | ICD-10-CM

## 2020-03-22 DIAGNOSIS — I493 Ventricular premature depolarization: Secondary | ICD-10-CM

## 2020-03-22 DIAGNOSIS — I119 Hypertensive heart disease without heart failure: Secondary | ICD-10-CM

## 2020-03-22 DIAGNOSIS — R059 Cough, unspecified: Secondary | ICD-10-CM | POA: Diagnosis not present

## 2020-03-22 LAB — ALT: ALT: 17 IU/L (ref 0–32)

## 2020-03-22 MED ORDER — AMLODIPINE BESYLATE 2.5 MG PO TABS: ORAL_TABLET | ORAL | 3 refills | Status: DC

## 2020-03-22 NOTE — Patient Instructions (Signed)
Medication Instructions:  Your physician recommends that you continue on your current medications as directed. Please refer to the Current Medication list given to you today.  *If you need a refill on your cardiac medications before your next appointment, please call your pharmacy*   Lab Work: TODAY: ALT If you have labs (blood work) drawn today and your tests are completely normal, you will receive your results only by:  [place] (if you have MyChart) OR  A paper copy in the mail If you have any lab test that is abnormal or we need to change your treatment, we will call you to review the results.   Follow-Up: At [facility], you and your health needs are our priority.  As part of our continuing mission to provide you with exceptional heart care, we have created designated Provider Care Teams.  These Care Teams include your primary Cardiologist (physician) and Advanced Practice Providers (APPs -  Physician Assistants and Nurse Practitioners) who all work together to provide you with the care you need, when you need it.  Your next appointment:   1 year(s)  The format for your next appointment:   In Person  Provider:   You may see Fransico Him, MD or one of the following Advanced Practice Providers on your designated Care Team:    Melina Copa, PA-C  Ermalinda Barrios, PA-C

## 2020-03-22 NOTE — Addendum Note (Signed)
Addended by: Antonieta Iba on: 03/22/2020 09:36 AM   Modules accepted: Orders

## 2020-03-22 NOTE — Progress Notes (Signed)
Cardiology Office Note:    Date:  03/22/2020   ID:  Jacqueline Burke, DOB 01-24-1936, MRN [id number]  PCP:  Glenis Smoker, MD  Cardiologist:  Fransico Him, MD    Referring MD: Glenis Smoker, *   Chief Complaint  Patient presents with  . Coronary Artery Disease  . Hyperlipidemia    History of Present Illness:    Jacqueline Burke is a 84 y.o. female with a hx of hypertension, asymptomatic PVCs,ASCAD s/pNSTEMI with peak troponin of 19in setting of acute respiratory failure and E Coli bacteremia in January 2019. 2D echowithnormal LVEF, elevated LVEDP and cath with95% RCA stenosis s/p PCI of the RCA with DES and normal EF, EF 55-65% s/p DES to RCA.  She is here today for followup and is doing well.  Since I saw her last, she had COVID 47 last Spring despite getting vaccinated.  She has noticed DOE at times that is off and on and is stable.  This is mainly with extreme exertion.  She denies any chest pain or pressure, PND, orthopnea,  dizziness, palpitations or syncope. Occasionally she will have some mild LE edema at the end of the day if she has been on her feet for long periods of time.  She is compliant with her meds and is tolerating meds with no SE.    Past Medical History:  Diagnosis Date  . Allergic rhinitis   . CAD (coronary artery disease), native coronary artery    NSTEMI with 95% RCA s/p DES to RCA 06/2017  . Diverticulosis   . GERD (gastroesophageal reflux disease)   . Heart murmur    Systolic heart murmur with Aortic valve sclerosis by ECHO  . History of kidney stones   . Hyperlipidemia   . Hypertension   . Osteoarthritis    Erosive OA-MRI of R hand negative for synovitis, only OA-Dr Trudie Reed  . Osteopenia   . Pelvic prolapse   . PVC's (premature ventricular contractions)     Past Surgical History:  Procedure Laterality Date  . BUNIONECTOMY  08/2006   R foot and hammer roe right second toe  . CARDIAC CATHETERIZATION    . CARPAL TUNNEL RELEASE  2002    bil hands  . CORONARY STENT INTERVENTION N/A 06/15/2017   Procedure: CORONARY STENT INTERVENTION;  Surgeon: Lorretta Harp, MD;  Location: [facility];  Service: Cardiovascular;  Laterality: N/A;  . CYSTOSCOPY     with laser lithotripsy Dr. Gloriann Loan 07-20-17   . CYSTOSCOPY W/ URETERAL STENT PLACEMENT Left 06/09/2017   Procedure: CYSTOSCOPY WITH RETROGRADE PYELOGRAM/URETERAL STENT PLACEMENT;  Surgeon: Lucas Mallow, MD;  Location: [facility];  Service: Urology;  Laterality: Left;  . CYSTOSCOPY WITH RETROGRADE PYELOGRAM, URETEROSCOPY AND STENT PLACEMENT Left 07/20/2017   Procedure: CYSTOSCOPY WITH RETROGRADE PYELOGRAM, LEFT URETEROSCOPY HOLMIUM LASER LITHO  AND STENT EXCHANGE;  Surgeon: Lucas Mallow, MD;  [facility];  Service: Urology;  Laterality: Left;  . HOLMIUM LASER APPLICATION Left 12/03/5007   Procedure: HOLMIUM LASER APPLICATION;  Surgeon: Lucas Mallow, MD;  [facility];  Service: Urology;  Laterality: Left;  . LEFT HEART CATH AND CORONARY ANGIOGRAPHY N/A 06/15/2017   Procedure: LEFT HEART CATH AND CORONARY ANGIOGRAPHY;  Surgeon: Lorretta Harp, MD;  Location: [facility];  Service: Cardiovascular;  Laterality: N/A;  . TOTAL KNEE ARTHROPLASTY Left 08/2007  . TOTAL KNEE ARTHROPLASTY Right 03/2010    Current Medications: Current Meds  Medication Sig  .  acetaminophen (TYLENOL) 500 MG tablet Take 1,000 mg by mouth every 8 (eight) hours as needed for moderate pain or headache.  Marland Kitchen amitriptyline (ELAVIL) 10 MG tablet Take 20 mg by mouth at bedtime.   Marland Kitchen amLODipine (NORVASC) 5 MG tablet Take 15 mg by mouth every other day.  . Ascorbic Acid (VITAMIN C PO) Take 1,000 mg by mouth daily.   Marland Kitchen aspirin EC 81 MG tablet Take 81 mg by mouth daily. On hold for procedure  . atorvastatin (LIPITOR) 80 MG tablet Take 1 tablet (80 mg total) by mouth daily at 6 PM.  . cetirizine (ZYRTEC) 10 MG chewable tablet Chew 10 mg by mouth daily.  . Cholecalciferol (VITAMIN D PO) Take  3,000 Units by mouth daily.   . clopidogrel (PLAVIX) 75 MG tablet TAKE 1 TABLET BY MOUTH ONCE DAILY **NEED  OFFICE  VISIT  FOR  FURTHER  REFILLS**  . docusate sodium (COLACE) 100 MG capsule Take 200 mg by mouth daily.   Marland Kitchen FIBER PO Take 2 tablets by mouth daily.  . fluticasone (FLONASE) 50 MCG/ACT nasal spray Place 2 sprays into both nostrils daily.  Marland Kitchen gabapentin (NEURONTIN) 100 MG capsule Take 300 mg by mouth daily.   Marland Kitchen gabapentin (NEURONTIN) 300 MG capsule Take 300 mg by mouth 2 (two) times daily.  Marland Kitchen HYDROcodone-acetaminophen (NORCO/VICODIN) 5-325 MG tablet Take 1 tablet by mouth every 6 (six) hours as needed for moderate pain.  . metoprolol tartrate (LOPRESSOR) 25 MG tablet Take 1 tablet by mouth twice daily  . Multiple Vitamins-Minerals (ZINC PO) Take 50 mg by mouth daily.  . trospium (SANCTURA) 20 MG tablet Take 40 mg by mouth daily.  . [DISCONTINUED] trospium (SANCTURA) 20 MG tablet Take 60 mg by mouth daily.     Allergies:   Ciprofloxacin, Codeine, Demerol [meperidine], Other, Hydrogen peroxide, Propoxyphene, and Tramadol   Social History   Socioeconomic History  . Marital status: Married    Spouse name: Not on file  . Number of children: Not on file  . Years of education: Not on file  . Highest education level: Not on file  Occupational History  . Occupation: Retired  Tobacco Use  . Smoking status: Former Smoker    Packs/day: 1.00    Years: 25.00    Pack years: 25.00    Types: Cigarettes    Quit date: 06/02/1993    Years since quitting: 26.8  . Smokeless tobacco: Never Used  Vaping Use  . Vaping Use: Never used  Substance and Sexual Activity  . Alcohol use: No  . Drug use: No  . Sexual activity: Not Currently  Other Topics Concern  . Not on file  Social History Narrative  . Not on file   Social Determinants of Health   Financial Resource Strain:   . Difficulty of Paying Living Expenses: Not on file  Food Insecurity:   . Worried About Charity fundraiser in the  Last Year: Not on file  . Ran Out of Food in the Last Year: Not on file  Transportation Needs:   . Lack of Transportation (Medical): Not on file  . Lack of Transportation (Non-Medical): Not on file  Physical Activity:   . Days of Exercise per Week: Not on file  . Minutes of Exercise per Session: Not on file  Stress:   . Feeling of Stress : Not on file  Social Connections:   . Frequency of Communication with Friends and Family: Not on file  . Frequency of Social Gatherings  with Friends and Family: Not on file  . Attends Religious Services: Not on file  . Active Member of Clubs or Organizations: Not on file  . Attends Archivist Meetings: Not on file  . Marital Status: Not on file     Family History: The patient's family history includes Breast cancer in her cousin; Diabetes in her mother; Heart attack in her mother; Osteoporosis in her father; Stroke in her mother.  ROS:   Please see the history of present illness.    ROS  All other systems reviewed and negative.   EKGs/Labs/Other Studies Reviewed:    The following studies were reviewed today:   EKG:  EKG is  ordered today and showed sinus bradycardia at 54bpm with no ST changes   Recent Labs: No results found for requested labs within last 8760 hours.   Recent Lipid Panel    Component Value Date/Time   CHOL 79 06/16/2017 0535   TRIG 116 06/16/2017 0535   HDL 23 (L) 06/16/2017 0535   CHOLHDL 3.4 06/16/2017 0535   VLDL 23 06/16/2017 0535   LDLCALC 33 06/16/2017 0535    Physical Exam:    VS:  BP 130/60   Pulse (!) 54   Ht 5\' 1"  (1.549 m)   Wt 158 lb 9.6 oz (71.9 kg)   SpO2 94%   BMI 29.97 kg/m     Wt Readings from Last 3 Encounters:  03/22/20 158 lb 9.6 oz (71.9 kg)  03/20/20 158 lb 9.6 oz (71.9 kg)  03/17/19 167 lb (75.8 kg)     GEN: Well nourished, well developed in no acute distress HEENT: Normal NECK: No JVD; No carotid bruits LYMPHATICS: No lymphadenopathy CARDIAC:RRR, no murmurs, rubs,  gallops RESPIRATORY:  Clear to auscultation without rales, wheezing or rhonchi  ABDOMEN: Soft, non-tender, non-distended MUSCULOSKELETAL:  No edema; No deformity  SKIN: Warm and dry NEUROLOGIC:  Alert and oriented x 3 PSYCHIATRIC:  Normal affect    ASSESSMENT:    1. Coronary artery disease involving native coronary artery of native heart without angina pectoris   2. Benign hypertensive heart disease without heart failure   3. Hyperlipidemia LDL goal <70   4. PVC's (premature ventricular contractions)    PLAN:    In order of problems listed above:  1.  ASCAD S/p NSTEMI with cath showing95% RCA stenosis s/p PCI of the RCA with DES -she has not had any anginal symptoms since I saw her last -continue ASA, Plavix, BB and staitn  2.  HTN -BP well controlled on exam today -she is currently taking amlodipine 15mg  qod (she has trouble cutting her tablet in half to do 7.5mg  daily which she was supposed to be on) -I will prescribe a 2.5mg  Amlodipine tablet to take daily with her 5mg  tablet and continue Lopresor 25mg  BID  3.  HLD -LDL goal < 70 -continue atorvastatin 80mg  daily -LDL was 57 in April 2021 -check ALT  4.  PVCs -she denies any palpitations -continue BB   Medication Adjustments/Labs and Tests Ordered: Current medicines are reviewed at length with the patient today.  Concerns regarding medicines are outlined above.  Orders Placed This Encounter  Procedures  . EKG 12-Lead   No orders of the defined types were placed in this encounter.   Signed, Fransico Him, MD  03/22/2020 9:25 AM    [facility]

## 2020-03-23 DIAGNOSIS — M8949 Other hypertrophic osteoarthropathy, multiple sites: Secondary | ICD-10-CM | POA: Diagnosis not present

## 2020-03-23 DIAGNOSIS — I10 Essential (primary) hypertension: Secondary | ICD-10-CM | POA: Diagnosis not present

## 2020-03-23 DIAGNOSIS — J8410 Pulmonary fibrosis, unspecified: Secondary | ICD-10-CM | POA: Diagnosis not present

## 2020-04-09 ENCOUNTER — Other Ambulatory Visit: Payer: Self-pay | Admitting: Cardiology

## 2020-04-17 ENCOUNTER — Other Ambulatory Visit: Payer: Self-pay | Admitting: Family Medicine

## 2020-04-17 DIAGNOSIS — Z1231 Encounter for screening mammogram for malignant neoplasm of breast: Secondary | ICD-10-CM

## 2020-05-01 ENCOUNTER — Other Ambulatory Visit: Payer: Self-pay | Admitting: Cardiology

## 2020-05-29 ENCOUNTER — Ambulatory Visit
Admission: RE | Admit: 2020-05-29 | Discharge: 2020-05-29 | Disposition: A | Discharge status: Home / Self Care | Payer: PPO | Source: Ambulatory Visit | Attending: Family Medicine | Admitting: Family Medicine

## 2020-05-29 ENCOUNTER — Other Ambulatory Visit: Payer: Self-pay

## 2020-05-29 DIAGNOSIS — Z1231 Encounter for screening mammogram for malignant neoplasm of breast: Secondary | ICD-10-CM

## 2020-06-12 DIAGNOSIS — R350 Frequency of micturition: Secondary | ICD-10-CM | POA: Diagnosis not present

## 2020-06-12 DIAGNOSIS — N3941 Urge incontinence: Secondary | ICD-10-CM | POA: Diagnosis not present

## 2020-07-02 ENCOUNTER — Other Ambulatory Visit: Payer: Self-pay | Admitting: Family Medicine

## 2020-07-02 ENCOUNTER — Ambulatory Visit
Admission: RE | Admit: 2020-07-02 | Discharge: 2020-07-02 | Disposition: A | Discharge status: Home / Self Care | Payer: PPO | Source: Ambulatory Visit | Attending: Family Medicine | Admitting: Family Medicine

## 2020-07-02 DIAGNOSIS — M25551 Pain in right hip: Secondary | ICD-10-CM | POA: Diagnosis not present

## 2020-07-02 DIAGNOSIS — R52 Pain, unspecified: Secondary | ICD-10-CM

## 2020-07-06 ENCOUNTER — Other Ambulatory Visit: Payer: Self-pay | Admitting: Cardiology

## 2020-08-03 ENCOUNTER — Encounter (HOSPITAL_COMMUNITY): Payer: Self-pay | Admitting: Emergency Medicine

## 2020-08-03 ENCOUNTER — Emergency Department (HOSPITAL_COMMUNITY): Payer: PPO

## 2020-08-03 ENCOUNTER — Inpatient Hospital Stay (HOSPITAL_COMMUNITY)
Admission: EM | Admit: 2020-08-03 | Discharge: 2020-08-07 | DRG: 480 | Disposition: A | Discharge status: Skilled Nursing Facility | Payer: PPO | Attending: Internal Medicine | Admitting: Internal Medicine

## 2020-08-03 ENCOUNTER — Other Ambulatory Visit: Payer: Self-pay

## 2020-08-03 DIAGNOSIS — I2510 Atherosclerotic heart disease of native coronary artery without angina pectoris: Secondary | ICD-10-CM | POA: Diagnosis present

## 2020-08-03 DIAGNOSIS — N1831 Chronic kidney disease, stage 3a: Secondary | ICD-10-CM | POA: Diagnosis present

## 2020-08-03 DIAGNOSIS — Z20822 Contact with and (suspected) exposure to covid-19: Secondary | ICD-10-CM | POA: Diagnosis not present

## 2020-08-03 DIAGNOSIS — Y92008 Other place in unspecified non-institutional (private) residence as the place of occurrence of the external cause: Secondary | ICD-10-CM

## 2020-08-03 DIAGNOSIS — D72828 Other elevated white blood cell count: Secondary | ICD-10-CM | POA: Diagnosis not present

## 2020-08-03 DIAGNOSIS — Z96653 Presence of artificial knee joint, bilateral: Secondary | ICD-10-CM | POA: Diagnosis not present

## 2020-08-03 DIAGNOSIS — M7989 Other specified soft tissue disorders: Secondary | ICD-10-CM | POA: Diagnosis not present

## 2020-08-03 DIAGNOSIS — I214 Non-ST elevation (NSTEMI) myocardial infarction: Secondary | ICD-10-CM | POA: Diagnosis not present

## 2020-08-03 DIAGNOSIS — Z7982 Long term (current) use of aspirin: Secondary | ICD-10-CM

## 2020-08-03 DIAGNOSIS — E8770 Fluid overload, unspecified: Secondary | ICD-10-CM | POA: Diagnosis not present

## 2020-08-03 DIAGNOSIS — R339 Retention of urine, unspecified: Secondary | ICD-10-CM | POA: Diagnosis not present

## 2020-08-03 DIAGNOSIS — K219 Gastro-esophageal reflux disease without esophagitis: Secondary | ICD-10-CM | POA: Diagnosis present

## 2020-08-03 DIAGNOSIS — J849 Interstitial pulmonary disease, unspecified: Secondary | ICD-10-CM | POA: Diagnosis not present

## 2020-08-03 DIAGNOSIS — Z87891 Personal history of nicotine dependence: Secondary | ICD-10-CM

## 2020-08-03 DIAGNOSIS — E785 Hyperlipidemia, unspecified: Secondary | ICD-10-CM | POA: Diagnosis not present

## 2020-08-03 DIAGNOSIS — E669 Obesity, unspecified: Secondary | ICD-10-CM | POA: Diagnosis present

## 2020-08-03 DIAGNOSIS — W010XXA Fall on same level from slipping, tripping and stumbling without subsequent striking against object, initial encounter: Secondary | ICD-10-CM | POA: Diagnosis present

## 2020-08-03 DIAGNOSIS — I129 Hypertensive chronic kidney disease with stage 1 through stage 4 chronic kidney disease, or unspecified chronic kidney disease: Secondary | ICD-10-CM | POA: Diagnosis present

## 2020-08-03 DIAGNOSIS — R41841 Cognitive communication deficit: Secondary | ICD-10-CM | POA: Diagnosis not present

## 2020-08-03 DIAGNOSIS — Z955 Presence of coronary angioplasty implant and graft: Secondary | ICD-10-CM

## 2020-08-03 DIAGNOSIS — M6281 Muscle weakness (generalized): Secondary | ICD-10-CM | POA: Diagnosis not present

## 2020-08-03 DIAGNOSIS — I10 Essential (primary) hypertension: Secondary | ICD-10-CM | POA: Diagnosis not present

## 2020-08-03 DIAGNOSIS — Z9181 History of falling: Secondary | ICD-10-CM | POA: Diagnosis not present

## 2020-08-03 DIAGNOSIS — N179 Acute kidney failure, unspecified: Secondary | ICD-10-CM | POA: Diagnosis not present

## 2020-08-03 DIAGNOSIS — R451 Restlessness and agitation: Secondary | ICD-10-CM | POA: Diagnosis not present

## 2020-08-03 DIAGNOSIS — S72141A Displaced intertrochanteric fracture of right femur, initial encounter for closed fracture: Secondary | ICD-10-CM | POA: Diagnosis not present

## 2020-08-03 DIAGNOSIS — Z7902 Long term (current) use of antithrombotics/antiplatelets: Secondary | ICD-10-CM

## 2020-08-03 DIAGNOSIS — U099 Post covid-19 condition, unspecified: Secondary | ICD-10-CM | POA: Diagnosis not present

## 2020-08-03 DIAGNOSIS — M1612 Unilateral primary osteoarthritis, left hip: Secondary | ICD-10-CM | POA: Diagnosis not present

## 2020-08-03 DIAGNOSIS — Z8781 Personal history of (healed) traumatic fracture: Secondary | ICD-10-CM

## 2020-08-03 DIAGNOSIS — S72142A Displaced intertrochanteric fracture of left femur, initial encounter for closed fracture: Secondary | ICD-10-CM | POA: Diagnosis not present

## 2020-08-03 DIAGNOSIS — S72102A Unspecified trochanteric fracture of left femur, initial encounter for closed fracture: Secondary | ICD-10-CM | POA: Diagnosis not present

## 2020-08-03 DIAGNOSIS — D62 Acute posthemorrhagic anemia: Secondary | ICD-10-CM | POA: Diagnosis not present

## 2020-08-03 DIAGNOSIS — N183 Chronic kidney disease, stage 3 unspecified: Secondary | ICD-10-CM | POA: Diagnosis present

## 2020-08-03 DIAGNOSIS — R0902 Hypoxemia: Secondary | ICD-10-CM

## 2020-08-03 DIAGNOSIS — M2550 Pain in unspecified joint: Secondary | ICD-10-CM | POA: Diagnosis not present

## 2020-08-03 DIAGNOSIS — S7222XA Displaced subtrochanteric fracture of left femur, initial encounter for closed fracture: Secondary | ICD-10-CM | POA: Diagnosis not present

## 2020-08-03 DIAGNOSIS — Z781 Physical restraint status: Secondary | ICD-10-CM | POA: Diagnosis not present

## 2020-08-03 DIAGNOSIS — I493 Ventricular premature depolarization: Secondary | ICD-10-CM | POA: Diagnosis not present

## 2020-08-03 DIAGNOSIS — J8410 Pulmonary fibrosis, unspecified: Secondary | ICD-10-CM | POA: Diagnosis present

## 2020-08-03 DIAGNOSIS — I252 Old myocardial infarction: Secondary | ICD-10-CM

## 2020-08-03 DIAGNOSIS — Z6831 Body mass index (BMI) 31.0-31.9, adult: Secondary | ICD-10-CM | POA: Diagnosis not present

## 2020-08-03 DIAGNOSIS — S72102D Unspecified trochanteric fracture of left femur, subsequent encounter for closed fracture with routine healing: Secondary | ICD-10-CM | POA: Diagnosis not present

## 2020-08-03 DIAGNOSIS — M25552 Pain in left hip: Secondary | ICD-10-CM | POA: Diagnosis not present

## 2020-08-03 DIAGNOSIS — Z79899 Other long term (current) drug therapy: Secondary | ICD-10-CM

## 2020-08-03 DIAGNOSIS — W19XXXA Unspecified fall, initial encounter: Secondary | ICD-10-CM | POA: Diagnosis not present

## 2020-08-03 DIAGNOSIS — J9601 Acute respiratory failure with hypoxia: Secondary | ICD-10-CM | POA: Diagnosis not present

## 2020-08-03 DIAGNOSIS — Z7401 Bed confinement status: Secondary | ICD-10-CM | POA: Diagnosis not present

## 2020-08-03 DIAGNOSIS — I959 Hypotension, unspecified: Secondary | ICD-10-CM | POA: Diagnosis not present

## 2020-08-03 DIAGNOSIS — I358 Other nonrheumatic aortic valve disorders: Secondary | ICD-10-CM | POA: Diagnosis not present

## 2020-08-03 DIAGNOSIS — S72142D Displaced intertrochanteric fracture of left femur, subsequent encounter for closed fracture with routine healing: Secondary | ICD-10-CM | POA: Diagnosis not present

## 2020-08-03 LAB — RESP PANEL BY RT-PCR (FLU A&B, COVID) ARPGX2
Influenza A by PCR: NEGATIVE
Influenza B by PCR: NEGATIVE
SARS Coronavirus 2 by RT PCR: NEGATIVE

## 2020-08-03 LAB — CBC WITH DIFFERENTIAL/PLATELET
Abs Immature Granulocytes: 0.16 10*3/uL — ABNORMAL HIGH (ref 0.00–0.07)
Basophils Absolute: 0 10*3/uL (ref 0.0–0.1)
Basophils Relative: 0 %
Eosinophils Absolute: 0.2 10*3/uL (ref 0.0–0.5)
Eosinophils Relative: 1 %
HCT: 35.2 % — ABNORMAL LOW (ref 36.0–46.0)
Hemoglobin: 11.4 g/dL — ABNORMAL LOW (ref 12.0–15.0)
Immature Granulocytes: 1 %
Lymphocytes Relative: 21 %
Lymphs Abs: 2.9 10*3/uL (ref 0.7–4.0)
MCH: 30.6 pg (ref 26.0–34.0)
MCHC: 32.4 g/dL (ref 30.0–36.0)
MCV: 94.6 fL (ref 80.0–100.0)
Monocytes Absolute: 0.9 10*3/uL (ref 0.1–1.0)
Monocytes Relative: 6 %
Neutro Abs: 9.8 10*3/uL — ABNORMAL HIGH (ref 1.7–7.7)
Neutrophils Relative %: 71 %
Platelets: 231 10*3/uL (ref 150–400)
RBC: 3.72 MIL/uL — ABNORMAL LOW (ref 3.87–5.11)
RDW: 13.7 % (ref 11.5–15.5)
WBC: 13.9 10*3/uL — ABNORMAL HIGH (ref 4.0–10.5)
nRBC: 0 % (ref 0.0–0.2)

## 2020-08-03 LAB — BASIC METABOLIC PANEL
Anion gap: 9 (ref 5–15)
BUN: 24 mg/dL — ABNORMAL HIGH (ref 8–23)
CO2: 24 mmol/L (ref 22–32)
Calcium: 9.3 mg/dL (ref 8.9–10.3)
Chloride: 107 mmol/L (ref 98–111)
Creatinine, Ser: 1.02 mg/dL — ABNORMAL HIGH (ref 0.44–1.00)
GFR, Estimated: 55 mL/min — ABNORMAL LOW (ref >60)
Glucose, Bld: 141 mg/dL — ABNORMAL HIGH (ref 70–99)
Potassium: 4 mmol/L (ref 3.5–5.1)
Sodium: 140 mmol/L (ref 135–145)

## 2020-08-03 LAB — PROTIME-INR
INR: 1 (ref 0.8–1.2)
Prothrombin Time: 13.2 seconds (ref 11.4–15.2)

## 2020-08-03 MED ORDER — ACETAMINOPHEN 325 MG PO TABS
650.0000 mg | ORAL_TABLET | Freq: Four times a day (QID) | ORAL | Status: DC | PRN
  Administered 2020-08-04: 04:00:00 650 mg via ORAL
  Filled 2020-08-03: qty 2

## 2020-08-03 MED ORDER — SODIUM CHLORIDE 0.9% FLUSH
3.0000 mL | INTRAVENOUS | Status: DC | PRN
  Administered 2020-08-04: 3 mL via INTRAVENOUS

## 2020-08-03 MED ORDER — MORPHINE SULFATE (PF) 4 MG/ML IV SOLN
4.0000 mg | INTRAVENOUS | Status: DC | PRN
Start: 2020-08-03 — End: 2020-08-05
  Administered 2020-08-03: 4 mg via INTRAVENOUS
  Filled 2020-08-03: qty 1

## 2020-08-03 MED ORDER — ACETAMINOPHEN 650 MG RE SUPP: 650.0000 mg | Freq: Four times a day (QID) | RECTAL | Status: DC | PRN

## 2020-08-03 MED ORDER — HYDROMORPHONE HCL 1 MG/ML IJ SOLN
0.5000 mg | INTRAMUSCULAR | Status: DC | PRN
  Administered 2020-08-04 (×2): 1 mg via INTRAVENOUS
  Administered 2020-08-04: 16:00:00 0.5 mg via INTRAVENOUS
  Administered 2020-08-04: 1 mg via INTRAVENOUS
  Filled 2020-08-03 (×4): qty 1

## 2020-08-03 MED ORDER — HYDROMORPHONE HCL 1 MG/ML IJ SOLN
0.5000 mg | INTRAMUSCULAR | Status: AC
  Administered 2020-08-03: 0.5 mg via INTRAVENOUS

## 2020-08-03 MED ORDER — HYDROMORPHONE HCL 1 MG/ML IJ SOLN
0.5000 mg | Freq: Once | INTRAMUSCULAR | Status: AC
  Administered 2020-08-03: 0.5 mg via INTRAVENOUS
  Filled 2020-08-03: qty 1

## 2020-08-03 MED ORDER — SODIUM CHLORIDE 0.9% FLUSH
3.0000 mL | Freq: Two times a day (BID) | INTRAVENOUS | Status: DC
  Administered 2020-08-04 – 2020-08-05 (×2): 3 mL via INTRAVENOUS

## 2020-08-03 MED ORDER — ONDANSETRON HCL 4 MG PO TABS: 4.0000 mg | ORAL_TABLET | Freq: Four times a day (QID) | ORAL | Status: DC | PRN

## 2020-08-03 MED ORDER — SODIUM CHLORIDE 0.9 % IV SOLN
250.0000 mL | INTRAVENOUS | Status: DC | PRN
  Administered 2020-08-05: 250 mL via INTRAVENOUS

## 2020-08-03 MED ORDER — ONDANSETRON HCL 4 MG/2ML IJ SOLN
4.0000 mg | Freq: Four times a day (QID) | INTRAMUSCULAR | Status: DC | PRN
  Administered 2020-08-04: 4 mg via INTRAVENOUS
  Filled 2020-08-03: qty 2

## 2020-08-03 NOTE — ED Triage Notes (Addendum)
Patient arrives from home s/p fall via EMS. Patient was walking down the hallway and tripped over her dog, which caused her to fall backwards and she fell on her bottom. Patient is on plavix, but no head injury. Patient complaining of left lateral hip pain with inability to lay on her back. PMS in tact in leg, no other injuries or complaints. Patient was found to be 78% RA on arrival. Placed on 4L [place], O2 now 97%.

## 2020-08-03 NOTE — Progress Notes (Signed)
Radiographs reviewed Patient has left hip intertrochanteric fracture/subtrochanteric fracture Plan is medical admission with n.p.o. after midnight and no anticoagulants.  Hold Plavix.  Plan to use TXA tomorrow.  Plan for cable fixation and intramedullary hip screw tomorrow.  I will see the patient in preop holding prior to her surgery.

## 2020-08-03 NOTE — H&P (Signed)
History and Physical    BABY GIEGER WUJ:[id number] DOB: 06-Feb-1936 DOA: 08/03/2020  PCP: Glenis Smoker, MD   Patient coming from: home   I have personally briefly reviewed patient's old medical records in [place]  Chief Complaint: fall with left leg pain   HPI: Jacqueline Burke is a 85 y.o. female with medical history significant of HTN, PVCs, ASCAD s/p NSTEMI in setting of acute respiratory failure and e.coli bacteremia in January of 2019, pulmonary fibrosis, CKD stage 3 and allergies who presented to ER via EMS after fall.   She was at home around 6pm and she was walking down the hall and her boxer dog came up and she stumbled over the dog and fell straight back on her back. She hit her head on the bathroom door and then she hit the floor on her buttocks. She had immediate pain in her left leg (all over) and she couldn't move her leg or get up. Her husband called 85 and brought her into ER. She tells me her pain is awful and if possible > 10/10. She denies any headache, vision change, neck pain, shortness of breath or chest pain. No coughing.  No nausea/vomiting or abdominal pain. She is just in pain in her left hip. She has never needed oxygen at home.   ED Course: vitals stable on arrival with bp of 160/75, HR of 91,afebrile. Oxygen was 78% and 2L oxygen was placed via [place]. Does not wear at oxygen at home.  Xray showed her to have a comminuted fracture of the proximal left femur with intertrochanteric and subtrochanteric fracture lines.  Ortho was consulted, Dr. Marlou Sa who plans on surgery tomorrow. Labs appear at her baseline with her kidney disease. CBC showed mildly elevated WBC which is likely reactive. Anemia around baseline. She was given morphine which didn't touch her pain and then small dose of dilaudid.   Review of Systems: As per HPI otherwise all other systems reviewed and are negative.   Past Medical History:  Diagnosis Date  . Allergic rhinitis   . CAD  (coronary artery disease), native coronary artery    NSTEMI with 95% RCA s/p DES to RCA 06/2017  . Diverticulosis   . GERD (gastroesophageal reflux disease)   . Heart murmur    Systolic heart murmur with Aortic valve sclerosis by ECHO  . History of kidney stones   . Hyperlipidemia   . Hypertension   . Osteoarthritis    Erosive OA-MRI of R hand negative for synovitis, only OA-Dr Trudie Reed  . Osteopenia   . Pelvic prolapse   . PVC's (premature ventricular contractions)     Past Surgical History:  Procedure Laterality Date  . BUNIONECTOMY  08/2006   R foot and hammer roe right second toe  . CARDIAC CATHETERIZATION    . CARPAL TUNNEL RELEASE  2002   bil hands  . CORONARY STENT INTERVENTION N/A 06/15/2017   Procedure: CORONARY STENT INTERVENTION;  Surgeon: Lorretta Harp, MD;  Location: [facility];  Service: Cardiovascular;  Laterality: N/A;  . CYSTOSCOPY     with laser lithotripsy Dr. Gloriann Loan 07-20-17   . CYSTOSCOPY W/ URETERAL STENT PLACEMENT Left 06/09/2017   Procedure: CYSTOSCOPY WITH RETROGRADE PYELOGRAM/URETERAL STENT PLACEMENT;  Surgeon: Lucas Mallow, MD;  [facility];  Service: Urology;  Laterality: Left;  . CYSTOSCOPY WITH RETROGRADE PYELOGRAM, URETEROSCOPY AND STENT PLACEMENT Left 07/20/2017   Procedure: CYSTOSCOPY WITH RETROGRADE PYELOGRAM, LEFT URETEROSCOPY HOLMIUM LASER LITHO  AND  STENT EXCHANGE;  Surgeon: Lucas Mallow, MD;  [facility];  Service: Urology;  Laterality: Left;  . HOLMIUM LASER APPLICATION Left 1/47/8295   Procedure: HOLMIUM LASER APPLICATION;  Surgeon: Lucas Mallow, MD;  [facility];  Service: Urology;  Laterality: Left;  . LEFT HEART CATH AND CORONARY ANGIOGRAPHY N/A 06/15/2017   Procedure: LEFT HEART CATH AND CORONARY ANGIOGRAPHY;  Surgeon: Lorretta Harp, MD;  Location: [facility];  Service: Cardiovascular;  Laterality: N/A;  . TOTAL KNEE ARTHROPLASTY Left 08/2007  . TOTAL KNEE ARTHROPLASTY Right 03/2010     Social History  reports that she quit smoking about 27 years ago. Her smoking use included cigarettes. She has a 25.00 pack-year smoking history. She has never used smokeless tobacco. She reports that she does not drink alcohol and does not use drugs.  Allergies  Allergen Reactions  . Ciprofloxacin Hives  . Codeine Hives  . Demerol [Meperidine] Hives  . Other Hives    Hydrogen peroxide Hydrogen peroxide  . Hydrogen Peroxide Other (See Comments)    White blisters  . Propoxyphene Other (See Comments)    Unknown Other reaction(s): OTHER  . Tramadol Other (See Comments)    "ITCHY ON THE INSIDE"    Family History  Problem Relation Age of Onset  . Stroke Mother   . Diabetes Mother   . Heart attack Mother   . Osteoporosis Father   . Breast cancer Cousin     Prior to Admission medications   Medication Sig Start Date End Date Taking? Authorizing Provider  acetaminophen (TYLENOL) 500 MG tablet Take 1,000 mg by mouth every 8 (eight) hours as needed for moderate pain or headache.   Yes [provider]  Albuterol Sulfate (PROAIR RESPICLICK) 621 (90 Base) MCG/ACT AEPB Inhale 1 puff into the lungs daily as needed (wheezing).   Yes [provider]  amitriptyline (ELAVIL) 10 MG tablet Take 20 mg by mouth at bedtime.    Yes [provider]  amLODipine (NORVASC) 2.5 MG tablet Take 1 tablet (2.5 mg) in addition to your 5 mg tablet daily. Patient taking differently: Take 2.5 mg by mouth daily. Take 1 tablet (2.5 mg) in addition to your 5 mg tablet daily. (Total 7.5 mg ) 03/22/20  Yes Turner, Traci R, MD  amLODipine (NORVASC) 5 MG tablet Take 5 mg by mouth daily. Take 1 tablet (5 mg total) daily in addition to 1 tablet (2.5 mg) daily . (Total 7.5 mg )   Yes [provider]  Ascorbic Acid (VITAMIN C PO) Take 1,000 mg by mouth daily.    Yes [provider]  aspirin EC 81 MG tablet Take 81 mg by mouth daily. On hold for procedure   Yes [provider]  atorvastatin (LIPITOR) 80 MG tablet Take 1 tablet (80 mg total) by mouth daily at 6 PM. 06/16/17  Yes Alma Friendly, MD  cetirizine (ZYRTEC) 10 MG chewable tablet Chew 10 mg by mouth daily.   Yes [provider]  Cholecalciferol (VITAMIN D PO) Take 3,000 Units by mouth daily.    Yes [provider]  clopidogrel (PLAVIX) 75 MG tablet Take 1 tablet by mouth once daily Patient taking differently: Take 75 mg by mouth daily. 07/06/20  Yes Turner, Eber Hong, MD  docusate sodium (COLACE) 100 MG capsule Take 200 mg by mouth daily.    Yes [provider]  FIBER PO Take 2 tablets by mouth daily.   Yes [provider]  fluticasone (FLONASE) 50 MCG/ACT nasal spray Place 2 sprays into both nostrils daily. 11/11/13  Yes Collene Gobble, MD  gabapentin (NEURONTIN) 300 MG capsule Take 600 mg by mouth daily. 12/06/19  Yes [provider]  metoprolol tartrate (LOPRESSOR) 25 MG tablet Take 1 tablet by mouth twice daily Patient taking differently: Take 25 mg by mouth 2 (two) times daily. 05/01/20  Yes Turner, Eber Hong, MD  Multiple Vitamin (MULTIVITAMIN WITH MINERALS) TABS tablet Take 1 tablet by mouth daily.   Yes [provider]  Trospium Chloride 60 MG CP24 Take 60 mg by mouth daily.   Yes [provider]  HYDROcodone-acetaminophen (NORCO/VICODIN) 5-325 MG tablet Take 1 tablet by mouth every 6 (six) hours as needed for moderate pain. Patient not taking: Reported on 08/03/2020 06/14/18   Eunice Blase, MD    Physical Exam: Vitals:   08/03/20 2040 08/03/20 2100 08/03/20 2130 08/03/20 2200  BP:  130/78 (!) 112/91 (!) 150/75  Pulse:  76 85 81  Resp:  (!) 27 20 (!) 21  Temp: 97.9 F (36.6 C)     TempSrc: Oral     SpO2:  99% 99% 97%    Constitutional: NAD, calm, comfortable Vitals:   08/03/20 2040 08/03/20 2100 08/03/20 2130 08/03/20 2200  BP:  130/78 (!) 112/91 (!) 150/75  Pulse:  76 85 81  Resp:  (!) 27 20 (!) 21  Temp: 97.9 F  (36.6 C)     TempSrc: Oral     SpO2:  99% 99% 97%   Eyes: PERRL, lids and conjunctivae normal ENMT: Mucous membranes are moist. Posterior pharynx clear of any exudate or lesions.Normal dentition.  Neck: normal, supple, no masses, no thyromegaly Respiratory: clear to auscultation bilaterally, no wheezing, no crackles. Normal respiratory effort. No accessory muscle use. Difficult exam as she can not sit up for me to auscultate.  Cardiovascular: Regular rate and rhythm, no murmurs / rubs / gallops. No extremity edema. 2+ pedal pulses. No carotid bruits.  Abdomen: no tenderness, no masses palpated. No hepatosplenomegaly. Bowel sounds positive.  Musculoskeletal: no clubbing / cyanosis. Left leg shortened and externally rotated.  Skin: no rashes, lesions, ulcers. No induration Neurologic: CN 2-12 grossly intact. Sensation intact, DTR normal. She appears slightly sleepy, but answers questions appropriately.  Psychiatric: Normal judgment and insight. Alert and oriented x 3. Normal mood.    Labs on Admission: I have personally reviewed following labs and imaging studies  CBC: Recent Labs  Lab 08/03/20 2016  WBC 13.9*  NEUTROABS 9.8*  HGB 11.4*  HCT 35.2*  MCV 94.6  PLT 767    Basic Metabolic Panel: Recent Labs  Lab 08/03/20 2016  NA 140  K 4.0  CL 107  CO2 24  GLUCOSE 141*  BUN 24*  CREATININE 1.02*  CALCIUM 9.3    GFR: CrCl cannot be calculated (Unknown ideal weight.).  Liver Function Tests: No results for input(s): AST, ALT, ALKPHOS, BILITOT, PROT, ALBUMIN in the last 168 hours.  Urine analysis:    Component Value Date/Time   COLORURINE YELLOW 06/07/2017 1536   APPEARANCEUR CLOUDY (A) 06/07/2017 1536   LABSPEC >1.030 (H) 06/07/2017 1536   PHURINE 5.0 06/07/2017 1536   GLUCOSEU NEGATIVE 06/07/2017 1536   HGBUR SMALL (A) 06/07/2017 1536   BILIRUBINUR NEGATIVE 06/07/2017 1536   KETONESUR 15 (A) 06/07/2017 1536   PROTEINUR 30 (A) 06/07/2017 1536   UROBILINOGEN 0.2  04/03/2010 1025   NITRITE NEGATIVE 06/07/2017 1536   LEUKOCYTESUR MODERATE (A) 06/07/2017  [place] on Admission: DG Hip Unilat With Pelvis 2-3 Views Left  Result Date: 08/03/2020 CLINICAL DATA:  Fall, leg shortening EXAM: DG HIP (WITH OR WITHOUT PELVIS) 2-3V LEFT COMPARISON:  Radiograph 07/02/2020 FINDINGS: Comminuted fracture the proximal left femur with intertrochanteric and subtrochanteric fracture lines. Slight varus angulation and minimal foreshortening. Femoral head remains normally located. No other acute fracture or traumatic osseous injury of the pelvis. Degenerative changes in the lower spine, SI joints and bilateral hips are mild-to-moderate. No suspicious lytic or blastic lesions. Left hip swelling is present. Remaining soft tissues are unremarkable. IMPRESSION: Comminuted fracture of the proximal left femur with intertrochanteric and subtrochanteric fracture lines. Slight foreshortening and varus angulation across the fracture. Electronically Signed   By: Lovena Le M.D.   On: 08/03/2020 19:58     Assessment/Plan  Intertrochanteric fracture of left hip ([place]) -admit to inpatient. Ortho consulted, Dr. Marlou Sa, and he has already seen patient with plan for surgery in the AM -NPO after midnight -hold plavix/asa/anticoagulation.  -TED hose if able.  -pain control which has been difficult. Do not want to give too much with new need for oxygen and hx of ILD, but want her comfortable. Morphine did not touch her. Will write for dialaudid .5mg  q2 hours prn   CAD (coronary artery disease), native coronary artery -asymptomatic with no anginal symptoms.  -S/p NSTEMI in 2019 with cath showing95% RCA stenosiss/p PCI of the RCA with DES -continue beta blocker/statin. Holding asa/plavix for surgery.     Hyperlipidemia LDL goal <70 -continue statin therapy  -LD was 57 in April 2021    Essential hypertension -decently controlled in setting of severe pain and controlled  outpatient. Continue home meds of norvasc 7.5mg  daily and lopressor 25mg  BID    CKD (chronic kidney disease), stage III (HCC) -at baseline    Pulmonary fibrosis ([place]) -new need for oxygen and unsure if inaccurate readings.  -check CXR and titrate oxygen off as tolerated.  asymptomatic at this point with no shortness of breath, cough, etc.     DVT prophylaxis: TEDs/no anticoagulation due to surgery in AM. Code Status:   Full  Family Communication:  Garvin Fila (husband) at bedside  Disposition Plan:   Patient is from:  home  Anticipated DC to:  home  Anticipated DC date:  2-3 days  Anticipated DC barriers: none  Consults called:  Ortho: Dr. Marlou Sa  Admission status:  Inpatient   Severity of Illness: The appropriate patient status for this patient is INPATIENT. Inpatient status is judged to be reasonable and necessary in order to provide the required intensity of service to ensure the patient's safety. The patient's presenting symptoms, physical exam findings, and initial radiographic and laboratory data in the context of their chronic comorbidities is felt to place them at high risk for further clinical deterioration. Furthermore, it is not anticipated that the patient will be medically stable for discharge from the hospital within 2 midnights of admission.   * I certify that at the point of admission it is my clinical judgment that the patient will require inpatient hospital care spanning beyond 2 midnights from the point of admission due to high intensity of service, high risk for further deterioration and high frequency of surveillance required.*     Orma Flaming MD [facility]  How to contact the [facility] Attending or Consulting provider [place] or covering provider during after hours [place], for this patient?   1. Check the care team in  [facility] and look for a) attending/consulting [facility] provider listed and b) the [facility] team listed 2. Log into [email or web address] and use [place]'s  universal password to access. If you do not have the password, please contact the hospital operator. 3. Locate the [facility] provider you are looking for under [facility] and page to a number that you can be directly reached. 4. If you still have difficulty reaching the provider, please page the [facility] (Director on Call) for the Hospitalists listed on amion for assistance.  08/04/2020, 12:01 AM

## 2020-08-03 NOTE — ED Provider Notes (Addendum)
[facility] DEPT Provider Note   CSN: [id number] Arrival date & time: 08/03/20  1903     History Chief Complaint  Patient presents with  . Fall  . Hip Pain    Jacqueline Burke is a 85 y.o. female.  HPI Patient reports that she tripped over her dog in her home.  This caused her to fall backwards and land on her buttocks.  She reports she landed hard  and has a lot of pain in her left hip.  She was not able to get back up.  She reports the only position she could maintain was lying on her right side.  She denies she struck her head.  She does not have headache or confusion.  She denies neck pain or chest pain.  Patient takes daily Plavix.  She reports she took her morning dose today.    Past Medical History:  Diagnosis Date  . Allergic rhinitis   . CAD (coronary artery disease), native coronary artery    NSTEMI with 95% RCA s/p DES to RCA 06/2017  . Diverticulosis   . GERD (gastroesophageal reflux disease)   . Heart murmur    Systolic heart murmur with Aortic valve sclerosis by ECHO  . History of kidney stones   . Hyperlipidemia   . Hypertension   . Osteoarthritis    Erosive OA-MRI of R hand negative for synovitis, only OA-Dr Trudie Reed  . Osteopenia   . Pelvic prolapse   . PVC's (premature ventricular contractions)     Patient Active Problem List   Diagnosis Date Noted  . Cough 03/20/2020  . CAD (coronary artery disease), native coronary artery 08/25/2017  . Hyperlipidemia LDL goal <70 08/25/2017  . NSTEMI (non-ST elevated myocardial infarction) ([place]) 06/12/2017  . Acute respiratory failure with hypoxemia ([place])   . E coli bacteremia   . Pyelonephritis   . Chronic chest pain   . Ureteral calculus 06/09/2017  . Edema extremities 02/16/2014  . Sinusitis, chronic 11/11/2013  . Dyspnea 11/11/2013  . PVC's (premature ventricular contractions) 08/19/2013  . Benign hypertensive heart disease without heart failure 08/15/2013    Past Surgical  History:  Procedure Laterality Date  . BUNIONECTOMY  08/2006   R foot and hammer roe right second toe  . CARDIAC CATHETERIZATION    . CARPAL TUNNEL RELEASE  2002   bil hands  . CORONARY STENT INTERVENTION N/A 06/15/2017   Procedure: CORONARY STENT INTERVENTION;  Surgeon: Lorretta Harp, MD;  Location: [facility];  Service: Cardiovascular;  Laterality: N/A;  . CYSTOSCOPY     with laser lithotripsy Dr. Gloriann Loan 07-20-17   . CYSTOSCOPY W/ URETERAL STENT PLACEMENT Left 06/09/2017   Procedure: CYSTOSCOPY WITH RETROGRADE PYELOGRAM/URETERAL STENT PLACEMENT;  Surgeon: Lucas Mallow, MD;  Location: [facility];  Service: Urology;  Laterality: Left;  . CYSTOSCOPY WITH RETROGRADE PYELOGRAM, URETEROSCOPY AND STENT PLACEMENT Left 07/20/2017   Procedure: CYSTOSCOPY WITH RETROGRADE PYELOGRAM, LEFT URETEROSCOPY HOLMIUM LASER LITHO  AND STENT EXCHANGE;  Surgeon: Lucas Mallow, MD;  Location: [facility];  Service: Urology;  Laterality: Left;  . HOLMIUM LASER APPLICATION Left 5/88/5027   Procedure: HOLMIUM LASER APPLICATION;  Surgeon: Lucas Mallow, MD;  Location: [facility];  Service: Urology;  Laterality: Left;  . LEFT HEART CATH AND CORONARY ANGIOGRAPHY N/A 06/15/2017   Procedure: LEFT HEART CATH AND CORONARY ANGIOGRAPHY;  Surgeon: Lorretta Harp, MD;  Location: [facility];  Service: Cardiovascular;  Laterality: N/A;  .  TOTAL KNEE ARTHROPLASTY Left 08/2007  . TOTAL KNEE ARTHROPLASTY Right 03/2010     OB History   No obstetric history on file.     Family History  Problem Relation Age of Onset  . Stroke Mother   . Diabetes Mother   . Heart attack Mother   . Osteoporosis Father   . Breast cancer Cousin     Social History   Tobacco Use  . Smoking status: Former Smoker    Packs/day: 1.00    Years: 25.00    Pack years: 25.00    Types: Cigarettes    Quit date: 06/02/1993    Years since quitting: 27.1  . Smokeless tobacco: Never Used  Vaping Use  . Vaping Use: Never used   Substance Use Topics  . Alcohol use: No  . Drug use: No    Home Medications Prior to Admission medications   Medication Sig Start Date End Date Taking? Authorizing Provider  acetaminophen (TYLENOL) 500 MG tablet Take 1,000 mg by mouth every 8 (eight) hours as needed for moderate pain or headache.   Yes [provider]  Albuterol Sulfate (PROAIR RESPICLICK) 101 (90 Base) MCG/ACT AEPB Inhale 1 puff into the lungs daily as needed (wheezing).   Yes [provider]  amitriptyline (ELAVIL) 10 MG tablet Take 20 mg by mouth at bedtime.    Yes [provider]  amLODipine (NORVASC) 2.5 MG tablet Take 1 tablet (2.5 mg) in addition to your 5 mg tablet daily. Patient taking differently: Take 2.5 mg by mouth daily. Take 1 tablet (2.5 mg) in addition to your 5 mg tablet daily. (Total 7.5 mg ) 03/22/20  Yes Turner, Traci R, MD  amLODipine (NORVASC) 5 MG tablet Take 5 mg by mouth daily. Take 1 tablet (5 mg total) daily in addition to 1 tablet (2.5 mg) daily . (Total 7.5 mg )   Yes [provider]  Ascorbic Acid (VITAMIN C PO) Take 1,000 mg by mouth daily.    Yes [provider]  aspirin EC 81 MG tablet Take 81 mg by mouth daily. On hold for procedure   Yes [provider]  atorvastatin (LIPITOR) 80 MG tablet Take 1 tablet (80 mg total) by mouth daily at 6 PM. 06/16/17  Yes Alma Friendly, MD  cetirizine (ZYRTEC) 10 MG chewable tablet Chew 10 mg by mouth daily.   Yes [provider]  Cholecalciferol (VITAMIN D PO) Take 3,000 Units by mouth daily.    Yes [provider]  clopidogrel (PLAVIX) 75 MG tablet Take 1 tablet by mouth once daily Patient taking differently: Take 75 mg by mouth daily. 07/06/20  Yes Turner, Eber Hong, MD  docusate sodium (COLACE) 100 MG capsule Take 200 mg by mouth daily.    Yes [provider]  FIBER PO Take 2 tablets by mouth daily.   Yes [provider]  fluticasone (FLONASE) 50 MCG/ACT nasal  spray Place 2 sprays into both nostrils daily. 11/11/13  Yes Collene Gobble, MD  gabapentin (NEURONTIN) 300 MG capsule Take 600 mg by mouth daily. 12/06/19  Yes [provider]  metoprolol tartrate (LOPRESSOR) 25 MG tablet Take 1 tablet by mouth twice daily Patient taking differently: Take 25 mg by mouth 2 (two) times daily. 05/01/20  Yes Turner, Eber Hong, MD  Multiple Vitamin (MULTIVITAMIN WITH MINERALS) TABS tablet Take 1 tablet by mouth daily.   Yes [provider]  Trospium Chloride 60 MG CP24 Take 60 mg by mouth daily.  Yes [provider]  HYDROcodone-acetaminophen (NORCO/VICODIN) 5-325 MG tablet Take 1 tablet by mouth every 6 (six) hours as needed for moderate pain. Patient not taking: Reported on 08/03/2020 06/14/18   Hilts, Legrand Como, MD    Allergies    Ciprofloxacin, Codeine, Demerol [meperidine], Other, Hydrogen peroxide, Propoxyphene, and Tramadol  Review of Systems   Review of Systems 10 systems reviewed and negative except as per HPI Physical Exam Updated Vital Signs BP (!) 150/75   Pulse 81   Temp 97.9 F (36.6 C) (Oral)   Resp (!) 21   SpO2 97%   Physical Exam Constitutional:      Comments: Alert with clear mental status.  No respiratory distress.  HENT:     Head: Normocephalic and atraumatic.  Eyes:     Extraocular Movements: Extraocular movements intact.  Neck:     Comments: No C-spine tenderness. Cardiovascular:     Rate and Rhythm: Normal rate and regular rhythm.  Pulmonary:     Effort: Pulmonary effort is normal.     Breath sounds: Normal breath sounds.  Abdominal:     General: There is no distension.     Palpations: Abdomen is soft.     Tenderness: There is no abdominal tenderness. There is no guarding.  Musculoskeletal:     Comments: Patient has severe pain with any movement of the left hip.  Left lower extremity shortened and externally rotated, other extremities without deformity or tenderness.  Feet are warm and dry.  dorsalis  pedis pulse 2+ symmetric.  Skin:    General: Skin is warm and dry.  Neurological:     General: No focal deficit present.     Mental Status: She is oriented to person, place, and time.     Coordination: Coordination normal.  Psychiatric:        Mood and Affect: Mood normal.     ED Results / Procedures / Treatments   Labs (all labs ordered are listed, but only abnormal results are displayed) Labs Reviewed  BASIC METABOLIC PANEL - Abnormal; Notable for the following components:      Result Value   Glucose, Bld 141 (*)    BUN 24 (*)    Creatinine, Ser 1.02 (*)    GFR, Estimated 55 (*)    All other components within normal limits  CBC WITH DIFFERENTIAL/PLATELET - Abnormal; Notable for the following components:   WBC 13.9 (*)    RBC 3.72 (*)    Hemoglobin 11.4 (*)    HCT 35.2 (*)    Neutro Abs 9.8 (*)    Abs Immature Granulocytes 0.16 (*)    All other components within normal limits  RESP PANEL BY RT-PCR (FLU A&B, COVID) ARPGX2  PROTIME-INR  TYPE AND SCREEN    EKG None  Radiology DG Hip Unilat With Pelvis 2-3 Views Left  Result Date: 08/03/2020 CLINICAL DATA:  Fall, leg shortening EXAM: DG HIP (WITH OR WITHOUT PELVIS) 2-3V LEFT COMPARISON:  Radiograph 07/02/2020 FINDINGS: Comminuted fracture the proximal left femur with intertrochanteric and subtrochanteric fracture lines. Slight varus angulation and minimal foreshortening. Femoral head remains normally located. No other acute fracture or traumatic osseous injury of the pelvis. Degenerative changes in the lower spine, SI joints and bilateral hips are mild-to-moderate. No suspicious lytic or blastic lesions. Left hip swelling is present. Remaining soft tissues are unremarkable. IMPRESSION: Comminuted fracture of the proximal left femur with intertrochanteric and subtrochanteric fracture lines. Slight foreshortening and varus angulation across the fracture. Electronically Signed   By:  Lovena Le M.D.   On: 08/03/2020 19:58     Procedures Procedures   Medications Ordered in ED Medications  morphine 4 MG/ML injection 4 mg (4 mg Intravenous Given 08/03/20 2138)    ED Course  I have reviewed the triage vital signs and the nursing notes.  Pertinent labs & imaging results that were available during my care of the patient were reviewed by me and considered in my medical decision making (see chart for details).    MDM Rules/Calculators/A&P                         Consult: Reviewed with Dr. Marlou Sa orthopedics.  Plan will be for operative repair tomorrow.  Hold Plavix and no heparin DVT prophylaxis.  N.p.o. after midnight  Patient have mechanical fall.  She has hip fracture.  No appearance of other traumatic injury.  Patient takes Plavix and aspirin daily.  Plan will be for admission for surgical repair by Dr. Marlou Sa tomorrow.  Consult: Reviewed with Dr. Eliberto Ivory for admission.   Final Clinical Impression(s) / ED Diagnoses Final diagnoses:  Closed intertrochanteric fracture of hip, left, initial encounter [facility])    Rx / [place] [facility] Orders ED Discharge Orders    None       Charlesetta Shanks, MD 08/03/20 2221    Charlesetta Shanks, MD 08/03/20 2326

## 2020-08-04 ENCOUNTER — Inpatient Hospital Stay (HOSPITAL_COMMUNITY): Payer: PPO

## 2020-08-04 ENCOUNTER — Inpatient Hospital Stay (HOSPITAL_COMMUNITY): Payer: PPO | Admitting: Anesthesiology

## 2020-08-04 ENCOUNTER — Encounter (HOSPITAL_COMMUNITY)
Admission: EM | Disposition: A | Discharge status: Skilled Nursing Facility | Payer: Self-pay | Source: Home / Self Care | Attending: Internal Medicine

## 2020-08-04 ENCOUNTER — Inpatient Hospital Stay: Admit: 2020-08-04 | Payer: PPO | Admitting: Orthopedic Surgery

## 2020-08-04 DIAGNOSIS — S72142A Displaced intertrochanteric fracture of left femur, initial encounter for closed fracture: Secondary | ICD-10-CM | POA: Diagnosis not present

## 2020-08-04 DIAGNOSIS — I10 Essential (primary) hypertension: Secondary | ICD-10-CM

## 2020-08-04 DIAGNOSIS — E785 Hyperlipidemia, unspecified: Secondary | ICD-10-CM

## 2020-08-04 DIAGNOSIS — J849 Interstitial pulmonary disease, unspecified: Secondary | ICD-10-CM

## 2020-08-04 DIAGNOSIS — I2510 Atherosclerotic heart disease of native coronary artery without angina pectoris: Secondary | ICD-10-CM

## 2020-08-04 HISTORY — PX: INTRAMEDULLARY (IM) NAIL INTERTROCHANTERIC: SHX5875

## 2020-08-04 LAB — BASIC METABOLIC PANEL
Anion gap: 11 (ref 5–15)
BUN: 25 mg/dL — ABNORMAL HIGH (ref 8–23)
CO2: 23 mmol/L (ref 22–32)
Calcium: 9.4 mg/dL (ref 8.9–10.3)
Chloride: 106 mmol/L (ref 98–111)
Creatinine, Ser: 1.24 mg/dL — ABNORMAL HIGH (ref 0.44–1.00)
GFR, Estimated: 43 mL/min — ABNORMAL LOW (ref >60)
Glucose, Bld: 182 mg/dL — ABNORMAL HIGH (ref 70–99)
Potassium: 4.7 mmol/L (ref 3.5–5.1)
Sodium: 140 mmol/L (ref 135–145)

## 2020-08-04 LAB — CBC
HCT: 37.1 % (ref 36.0–46.0)
Hemoglobin: 11.7 g/dL — ABNORMAL LOW (ref 12.0–15.0)
MCH: 30.4 pg (ref 26.0–34.0)
MCHC: 31.5 g/dL (ref 30.0–36.0)
MCV: 96.4 fL (ref 80.0–100.0)
Platelets: 235 10*3/uL (ref 150–400)
RBC: 3.85 MIL/uL — ABNORMAL LOW (ref 3.87–5.11)
RDW: 13.9 % (ref 11.5–15.5)
WBC: 14.9 10*3/uL — ABNORMAL HIGH (ref 4.0–10.5)
nRBC: 0 % (ref 0.0–0.2)

## 2020-08-04 LAB — SURGICAL PCR SCREEN
MRSA, PCR: NEGATIVE
Staphylococcus aureus: NEGATIVE

## 2020-08-04 LAB — HEMOGLOBIN A1C
Hgb A1c MFr Bld: 6.1 % — ABNORMAL HIGH (ref 4.8–5.6)
Mean Plasma Glucose: 128.37 mg/dL

## 2020-08-04 LAB — GLUCOSE, CAPILLARY: Glucose-Capillary: 153 mg/dL — ABNORMAL HIGH (ref 70–99)

## 2020-08-04 SURGERY — FIXATION, FRACTURE, INTERTROCHANTERIC, WITH INTRAMEDULLARY ROD
Anesthesia: General | Laterality: Left

## 2020-08-04 MED ORDER — LACTATED RINGERS IV SOLN: INTRAVENOUS | Status: AC

## 2020-08-04 MED ORDER — AMLODIPINE BESYLATE 5 MG PO TABS: 2.5000 mg | ORAL_TABLET | Freq: Every day | ORAL | Status: DC

## 2020-08-04 MED ORDER — EPHEDRINE SULFATE-NACL 50-0.9 MG/10ML-% IV SOSY
PREFILLED_SYRINGE | INTRAVENOUS | Status: DC | PRN
  Administered 2020-08-04: 10 mg via INTRAVENOUS
  Administered 2020-08-04: 15 mg via INTRAVENOUS
  Administered 2020-08-04: 10 mg via INTRAVENOUS
  Administered 2020-08-04: 15 mg via INTRAVENOUS

## 2020-08-04 MED ORDER — CHLORHEXIDINE GLUCONATE CLOTH 2 % EX PADS: 6.0000 | MEDICATED_PAD | Freq: Every day | CUTANEOUS | Status: DC

## 2020-08-04 MED ORDER — POLYETHYLENE GLYCOL 3350 17 G PO PACK
17.0000 g | PACK | Freq: Every day | ORAL | Status: DC | PRN
  Administered 2020-08-06: 17 g via ORAL
  Filled 2020-08-04: qty 1

## 2020-08-04 MED ORDER — CEFAZOLIN SODIUM-DEXTROSE 2-4 GM/100ML-% IV SOLN
2.0000 g | Freq: Three times a day (TID) | INTRAVENOUS | Status: AC
  Administered 2020-08-05 (×2): 2 g via INTRAVENOUS
  Filled 2020-08-04 (×2): qty 100

## 2020-08-04 MED ORDER — FENTANYL CITRATE (PF) 100 MCG/2ML IJ SOLN
INTRAMUSCULAR | Status: AC
  Filled 2020-08-04: qty 2

## 2020-08-04 MED ORDER — BUPIVACAINE HCL (PF) 0.25 % IJ SOLN
INTRAMUSCULAR | Status: AC
  Filled 2020-08-04: qty 30

## 2020-08-04 MED ORDER — BUPIVACAINE HCL 0.25 % IJ SOLN
INTRAMUSCULAR | Status: DC | PRN
  Administered 2020-08-04: 30 mL

## 2020-08-04 MED ORDER — POVIDONE-IODINE 10 % EX SWAB: 2.0000 "application " | Freq: Once | CUTANEOUS | Status: DC

## 2020-08-04 MED ORDER — AMITRIPTYLINE HCL 10 MG PO TABS
20.0000 mg | ORAL_TABLET | Freq: Every day | ORAL | Status: DC
  Administered 2020-08-05 – 2020-08-07 (×3): 20 mg via ORAL
  Filled 2020-08-04 (×4): qty 2

## 2020-08-04 MED ORDER — CEFAZOLIN SODIUM-DEXTROSE 2-4 GM/100ML-% IV SOLN
2.0000 g | INTRAVENOUS | Status: AC
  Administered 2020-08-04: 2 g via INTRAVENOUS

## 2020-08-04 MED ORDER — METOPROLOL TARTRATE 5 MG/5ML IV SOLN
2.5000 mg | Freq: Once | INTRAVENOUS | Status: AC
  Administered 2020-08-04: 2.5 mg via INTRAVENOUS
  Filled 2020-08-04: qty 5

## 2020-08-04 MED ORDER — AMLODIPINE BESYLATE 5 MG PO TABS: 5.0000 mg | ORAL_TABLET | Freq: Every day | ORAL | Status: DC

## 2020-08-04 MED ORDER — CHLORHEXIDINE GLUCONATE 4 % EX LIQD
60.0000 mL | Freq: Once | CUTANEOUS | Status: AC
  Administered 2020-08-04: 4 via TOPICAL

## 2020-08-04 MED ORDER — STERILE WATER FOR IRRIGATION IR SOLN
Status: DC | PRN
  Administered 2020-08-04: 1000 mL

## 2020-08-04 MED ORDER — VANCOMYCIN HCL 1000 MG IV SOLR
INTRAVENOUS | Status: DC | PRN
  Administered 2020-08-04: 1000 mg via TOPICAL

## 2020-08-04 MED ORDER — PHENYLEPHRINE 40 MCG/ML (10ML) SYRINGE FOR IV PUSH (FOR BLOOD PRESSURE SUPPORT)
PREFILLED_SYRINGE | INTRAVENOUS | Status: DC | PRN
  Administered 2020-08-04 (×2): 120 ug via INTRAVENOUS
  Administered 2020-08-04: 160 ug via INTRAVENOUS

## 2020-08-04 MED ORDER — DOCUSATE SODIUM 100 MG PO CAPS
200.0000 mg | ORAL_CAPSULE | Freq: Every day | ORAL | Status: DC
  Administered 2020-08-04 – 2020-08-07 (×3): 200 mg via ORAL
  Filled 2020-08-04 (×3): qty 2

## 2020-08-04 MED ORDER — CLONIDINE HCL (ANALGESIA) 100 MCG/ML EP SOLN
150.0000 ug | Freq: Once | EPIDURAL | Status: AC
  Administered 2020-08-04: .75 mL via INTRA_ARTICULAR
  Filled 2020-08-04 (×3): qty 10

## 2020-08-04 MED ORDER — CLOPIDOGREL BISULFATE 75 MG PO TABS
75.0000 mg | ORAL_TABLET | Freq: Every day | ORAL | Status: DC
  Administered 2020-08-06 – 2020-08-07 (×2): 75 mg via ORAL
  Filled 2020-08-04 (×3): qty 1

## 2020-08-04 MED ORDER — ROCURONIUM BROMIDE 100 MG/10ML IV SOLN
INTRAVENOUS | Status: DC | PRN
  Administered 2020-08-04: 10 mg via INTRAVENOUS
  Administered 2020-08-04: 60 mg via INTRAVENOUS

## 2020-08-04 MED ORDER — VANCOMYCIN HCL 1000 MG IV SOLR
INTRAVENOUS | Status: AC
  Filled 2020-08-04: qty 1000

## 2020-08-04 MED ORDER — ACETAMINOPHEN 10 MG/ML IV SOLN
INTRAVENOUS | Status: AC
  Administered 2020-08-04: 1000 mg via INTRAVENOUS
  Filled 2020-08-04: qty 100

## 2020-08-04 MED ORDER — FENTANYL CITRATE (PF) 100 MCG/2ML IJ SOLN: 25.0000 ug | INTRAMUSCULAR | Status: DC | PRN

## 2020-08-04 MED ORDER — NALOXONE HCL 0.4 MG/ML IJ SOLN
INTRAMUSCULAR | Status: AC
  Filled 2020-08-04: qty 1

## 2020-08-04 MED ORDER — LIDOCAINE HCL (CARDIAC) PF 100 MG/5ML IV SOSY
PREFILLED_SYRINGE | INTRAVENOUS | Status: DC | PRN
  Administered 2020-08-04: 100 mg via INTRAVENOUS

## 2020-08-04 MED ORDER — ATORVASTATIN CALCIUM 40 MG PO TABS
80.0000 mg | ORAL_TABLET | Freq: Every day | ORAL | Status: DC
  Administered 2020-08-05 – 2020-08-07 (×3): 80 mg via ORAL
  Filled 2020-08-04 (×3): qty 2

## 2020-08-04 MED ORDER — SUGAMMADEX SODIUM 200 MG/2ML IV SOLN
INTRAVENOUS | Status: DC | PRN
  Administered 2020-08-04: 200 mg via INTRAVENOUS

## 2020-08-04 MED ORDER — PROMETHAZINE HCL 25 MG/ML IJ SOLN: 6.2500 mg | INTRAMUSCULAR | Status: DC | PRN

## 2020-08-04 MED ORDER — MUPIROCIN 2 % EX OINT: 1.0000 "application " | TOPICAL_OINTMENT | Freq: Two times a day (BID) | CUTANEOUS | Status: DC

## 2020-08-04 MED ORDER — SENNOSIDES-DOCUSATE SODIUM 8.6-50 MG PO TABS
2.0000 | ORAL_TABLET | Freq: Every day | ORAL | Status: DC
  Administered 2020-08-05 – 2020-08-07 (×3): 2 via ORAL
  Filled 2020-08-04 (×3): qty 2

## 2020-08-04 MED ORDER — METOPROLOL TARTRATE 25 MG PO TABS
12.5000 mg | ORAL_TABLET | Freq: Two times a day (BID) | ORAL | Status: DC
  Filled 2020-08-04: qty 1

## 2020-08-04 MED ORDER — ASPIRIN 81 MG PO CHEW
81.0000 mg | CHEWABLE_TABLET | Freq: Every day | ORAL | Status: DC
  Administered 2020-08-06 – 2020-08-07 (×2): 81 mg via ORAL
  Filled 2020-08-04 (×3): qty 1

## 2020-08-04 MED ORDER — DEXAMETHASONE SODIUM PHOSPHATE 10 MG/ML IJ SOLN
INTRAMUSCULAR | Status: DC | PRN
  Administered 2020-08-04: 10 mg via INTRAVENOUS

## 2020-08-04 MED ORDER — MORPHINE SULFATE (PF) 4 MG/ML IV SOLN
INTRAVENOUS | Status: AC
  Filled 2020-08-04: qty 2

## 2020-08-04 MED ORDER — AMLODIPINE BESYLATE 5 MG PO TABS
7.5000 mg | ORAL_TABLET | Freq: Every day | ORAL | Status: DC
  Filled 2020-08-04: qty 2

## 2020-08-04 MED ORDER — FENTANYL CITRATE (PF) 100 MCG/2ML IJ SOLN
INTRAMUSCULAR | Status: DC | PRN
  Administered 2020-08-04: 100 ug via INTRAVENOUS

## 2020-08-04 MED ORDER — PROPOFOL 10 MG/ML IV BOLUS
INTRAVENOUS | Status: DC | PRN
  Administered 2020-08-04: 130 mg via INTRAVENOUS

## 2020-08-04 MED ORDER — ALBUTEROL SULFATE HFA 108 (90 BASE) MCG/ACT IN AERS: 1.0000 | INHALATION_SPRAY | Freq: Every day | RESPIRATORY_TRACT | Status: DC | PRN

## 2020-08-04 MED ORDER — TRANEXAMIC ACID-NACL 1000-0.7 MG/100ML-% IV SOLN
INTRAVENOUS | Status: DC | PRN
  Administered 2020-08-04: 1000 mg via INTRAVENOUS

## 2020-08-04 MED ORDER — 0.9 % SODIUM CHLORIDE (POUR BTL) OPTIME
TOPICAL | Status: DC | PRN
  Administered 2020-08-04: 4000 mL

## 2020-08-04 MED ORDER — METHOCARBAMOL 500 MG IVPB - SIMPLE MED
500.0000 mg | Freq: Four times a day (QID) | INTRAVENOUS | Status: DC | PRN
  Administered 2020-08-04: 500 mg via INTRAVENOUS
  Filled 2020-08-04: qty 500

## 2020-08-04 MED ORDER — METOPROLOL TARTRATE 25 MG PO TABS: 25.0000 mg | ORAL_TABLET | Freq: Two times a day (BID) | ORAL | Status: DC

## 2020-08-04 MED ORDER — TROSPIUM CHLORIDE ER 60 MG PO CP24: 60.0000 mg | ORAL_CAPSULE | Freq: Every day | ORAL | Status: DC

## 2020-08-04 MED ORDER — MORPHINE SULFATE (PF) 4 MG/ML IV SOLN
INTRAVENOUS | Status: DC | PRN
  Administered 2020-08-04: 8 mg via INTRAVENOUS
  Administered 2020-08-04: 8 mg

## 2020-08-04 MED ORDER — UMECLIDINIUM BROMIDE 62.5 MCG/INH IN AEPB
1.0000 | INHALATION_SPRAY | Freq: Every day | RESPIRATORY_TRACT | Status: DC
  Administered 2020-08-04: 08:00:00 1 via RESPIRATORY_TRACT
  Filled 2020-08-04: qty 7

## 2020-08-04 MED ORDER — METHOCARBAMOL 500 MG PO TABS: 500.0000 mg | ORAL_TABLET | Freq: Four times a day (QID) | ORAL | Status: DC | PRN

## 2020-08-04 MED ORDER — NALOXONE HCL 0.4 MG/ML IJ SOLN
0.4000 mg | INTRAMUSCULAR | Status: DC | PRN
  Administered 2020-08-04: 0.2 mg via INTRAVENOUS

## 2020-08-04 SURGICAL SUPPLY — 57 items
Arthrex ×3 IMPLANT
BIT DRILL 4.0X165 AO STYLE (BIT) ×1 IMPLANT
BIT DRILL 4.0X280 (BIT) ×2 IMPLANT
BLADE SURG 15 STRL LF DISP TIS (BLADE) ×1 IMPLANT
BLADE SURG 15 STRL SS (BLADE) ×2
BNDG GAUZE ELAST 4 BULKY (GAUZE/BANDAGES/DRESSINGS) ×2 IMPLANT
COVER MAYO STAND STRL (DRAPES) ×1 IMPLANT
COVER PERINEAL POST (MISCELLANEOUS) ×2 IMPLANT
COVER SURGICAL LIGHT HANDLE (MISCELLANEOUS) ×2 IMPLANT
COVER WAND RF STERILE (DRAPES) IMPLANT
DRAPE INCISE IOBAN 66X45 STRL (DRAPES) ×2 IMPLANT
DRAPE STERI IOBAN 125X83 (DRAPES) ×2 IMPLANT
DRESSING AQUACEL AG SP 3.5X4 (GAUZE/BANDAGES/DRESSINGS) ×2 IMPLANT
DRESSING AQUACEL AG SP 3.5X6 (GAUZE/BANDAGES/DRESSINGS) ×1 IMPLANT
DRSG AQUACEL AG ADV 3.5X 4 (GAUZE/BANDAGES/DRESSINGS) ×1 IMPLANT
DRSG AQUACEL AG ADV 3.5X 6 (GAUZE/BANDAGES/DRESSINGS) ×1 IMPLANT
DRSG AQUACEL AG SP 3.5X4 (GAUZE/BANDAGES/DRESSINGS)
DRSG AQUACEL AG SP 3.5X6 (GAUZE/BANDAGES/DRESSINGS) ×2
DURAPREP 26ML APPLICATOR (WOUND CARE) ×2 IMPLANT
ELECT REM PT RETURN 15FT ADLT (MISCELLANEOUS) ×2 IMPLANT
FIBERSTICK 2 (SUTURE) ×5 IMPLANT
FIBERTAPE CERCLAGE TLINK SUT (SUTURE) ×3 IMPLANT
GAUZE SPONGE 4X4 12PLY STRL (GAUZE/BANDAGES/DRESSINGS) ×2 IMPLANT
GAUZE XEROFORM 1X8 LF (GAUZE/BANDAGES/DRESSINGS) ×2 IMPLANT
GLOVE SRG 8 PF TXTR STRL LF DI (GLOVE) ×1 IMPLANT
GLOVE SURG ORTHO LTX SZ8 (GLOVE) ×2 IMPLANT
GLOVE SURG UNDER POLY LF SZ8 (GLOVE) ×2
GOWN STRL REUS W/TWL LRG LVL3 (GOWN DISPOSABLE) ×2 IMPLANT
GUIDEWIRE BALL NOSE 3.0X900 (WIRE) ×2
GUIDEWIRE ORTH 900X3XBALL NOSE (WIRE) IMPLANT
KIT BASIN OR (CUSTOM PROCEDURE TRAY) ×2 IMPLANT
KIT TURNOVER KIT A (KITS) ×2 IMPLANT
MANIFOLD NEPTUNE II (INSTRUMENTS) ×2 IMPLANT
MARKER SKIN DUAL TIP RULER LAB (MISCELLANEOUS) ×2 IMPLANT
NAIL TROCH IT 10X36X125 LT (Nail) ×1 IMPLANT
NS IRRIG 1000ML POUR BTL (IV SOLUTION) ×2 IMPLANT
PACK GENERAL/GYN (CUSTOM PROCEDURE TRAY) ×2 IMPLANT
PASSER CERCLAGE STRT LRG DISP (ORTHOPEDIC DISPOSABLE SUPPLIES) ×1 IMPLANT
PASSER CERCLAGE STRT MED DISP (ORTHOPEDIC DISPOSABLE SUPPLIES) ×1 IMPLANT
PENCIL SMOKE EVACUATOR (MISCELLANEOUS) IMPLANT
PIN GUIDE THRD AR 3.2X330 (PIN) ×2 IMPLANT
SCREW LAG GALILEO 10.5X90 (Screw) ×1 IMPLANT
SCREW LOCK CORT 5.0X40 (Screw) ×1 IMPLANT
SCREW LOCK FEM 5X44 (Screw) ×1 IMPLANT
SOL PREP POV-IOD 4OZ 10% (MISCELLANEOUS) ×2 IMPLANT
SPONGE LAP 18X18 RF (DISPOSABLE) ×2 IMPLANT
SPONGE LAP 4X18 RFD (DISPOSABLE) ×1 IMPLANT
STAPLER VISISTAT 35W (STAPLE) ×2 IMPLANT
SUT ETHILON 3 0 PS 1 (SUTURE) ×7 IMPLANT
SUT VIC AB 0 CT1 36 (SUTURE) ×3 IMPLANT
SUT VIC AB 1 CT1 27 (SUTURE) ×10
SUT VIC AB 1 CT1 27XBRD ANTBC (SUTURE) IMPLANT
SUT VIC AB 2-0 CT1 27 (SUTURE) ×4
SUT VIC AB 2-0 CT1 TAPERPNT 27 (SUTURE) ×2 IMPLANT
SUT VICRYL 0 UR6 27IN ABS (SUTURE) ×4 IMPLANT
TOOL ACTIVATION (INSTRUMENTS) ×1 IMPLANT
TOWEL OR 17X26 10 PK STRL BLUE (TOWEL DISPOSABLE) ×2 IMPLANT

## 2020-08-04 NOTE — Anesthesia Preprocedure Evaluation (Addendum)
Anesthesia Evaluation  Patient identified by MRN, date of birth, ID band Patient awake    Reviewed: Allergy & Precautions, NPO status , Patient's Chart, lab work & pertinent test results, reviewed documented beta blocker date and time   History of Anesthesia Complications Negative for: history of anesthetic complications  Airway Mallampati: II  TM Distance: >3 FB Neck ROM: Full    Dental no notable dental hx.    Pulmonary former smoker,    Pulmonary exam normal        Cardiovascular hypertension, Pt. on home beta blockers and Pt. on medications + CAD (on Plavix (last 08/03/20)), + Past MI and + Cardiac Stents (2019)  Normal cardiovascular exam  TTE 06/2017: EF 55%, mild LAE, lipomatous hypertrophy of atrial septum, PASP 50mmHg    Neuro/Psych negative neurological ROS  negative psych ROS   GI/Hepatic Neg liver ROS, GERD  ,  Endo/Other  negative endocrine ROS  Renal/GU Renal InsufficiencyRenal disease (Cr 1.24)  negative genitourinary   Musculoskeletal  (+) Arthritis ,   Abdominal   Peds  Hematology  (+) anemia , Hgb 11.7   Anesthesia Other Findings Day of surgery medications reviewed with patient.  Reproductive/Obstetrics negative OB ROS                            Anesthesia Physical Anesthesia Plan  ASA: II  Anesthesia Plan: General   Post-op Pain Management:    Induction: Intravenous  PONV Risk Score and Plan: 4 or greater and Treatment may vary due to age or medical condition, Ondansetron and Dexamethasone  Airway Management Planned: Oral ETT  Additional Equipment: None  Intra-op Plan:   Post-operative Plan: Extubation in OR  Informed Consent: I have reviewed the patients History and Physical, chart, labs and discussed the procedure including the risks, benefits and alternatives for the proposed anesthesia with the patient or authorized representative who has indicated his/her  understanding and acceptance.     Dental advisory given  Plan Discussed with: CRNA  Anesthesia Plan Comments:        Anesthesia Quick Evaluation

## 2020-08-04 NOTE — Anesthesia Procedure Notes (Signed)
Procedure Name: Intubation Date/Time: 08/04/2020 5:48 PM Performed by: [place] Dairon Procter C, CRNA Pre-anesthesia Checklist: Patient identified, Emergency Drugs available, Suction available and Patient being monitored Patient Re-evaluated:Patient Re-evaluated prior to induction Oxygen Delivery Method: Circle system utilized Preoxygenation: Pre-oxygenation with 100% oxygen Induction Type: IV induction Ventilation: Mask ventilation without difficulty Laryngoscope Size: Mac and 3 Grade View: Grade I Tube type: Oral Tube size: 7.0 mm Number of attempts: 1 Airway Equipment and Method: Stylet and Oral airway Placement Confirmation: ETT inserted through vocal cords under direct vision,  positive ETCO2 and breath sounds checked- equal and bilateral Secured at: 21 cm Tube secured with: Tape Dental Injury: Teeth and Oropharynx as per pre-operative assessment

## 2020-08-04 NOTE — Plan of Care (Signed)
Transferred stretcher to bed. CHG bath given.  Sacral foam applied as preventative measure.  O2 increased to 3L N/C. Heels floated.  Ice applied Left hip.  Plan of care discussed.

## 2020-08-04 NOTE — Progress Notes (Signed)
X-ray done.  Pt open eyes, not quiet awake to answer the question. Mumbles, open eyes then back to sleep.

## 2020-08-04 NOTE — Brief Op Note (Signed)
   08/04/2020  8:53 PM  PATIENT:  Jacqueline Burke  85 y.o. female  PRE-OPERATIVE DIAGNOSIS:  left hip fracture  POST-OPERATIVE DIAGNOSIS:  left hip fracture  PROCEDURE:  Procedure(s): INTRAMEDULLARY (IM) NAIL INTERTROCHANTRIC WITH CABLES  SURGEON:  Surgeon(s): Meredith Pel, MD  ASSISTANT: magnant pa  ANESTHESIA:   general  EBL: 150 ml    Total I/O In: 1700 [I.V.:1700] Out: 50 [Blood:50]  BLOOD ADMINISTERED: none  DRAINS: none   LOCAL MEDICATIONS USED: Marcaine morphine clonidine vancomycin powder  SPECIMEN:  No Specimen  COUNTS:  YES  TOURNIQUET:  * No tourniquets in log *  DICTATION: .Other Dictation: Dictation Number [id number]  PLAN OF CARE: Admit to inpatient   PATIENT DISPOSITION:  PACU - hemodynamically stable

## 2020-08-04 NOTE — Progress Notes (Signed)
#   16 Fr foley cath inserted w/o difficulty for a return of 500 cc's of amber urine, connected to s/d bag.

## 2020-08-04 NOTE — Progress Notes (Signed)
[facility] PROGRESS NOTE   Jacqueline Burke DDU:[id number] DOB: [birth year] DOA: 08/03/2020  PCP: Glenis Smoker, MD  Brief History/Interval Summary: 85 y.o. female with medical history significant of HTN, PVCs, ASCAD s/p NSTEMI in setting of acute respiratory failure and e.coli bacteremia in January of 2019, pulmonary fibrosis, CKD stage 3 and allergies who presented to ER via EMS after fall.   She sustained a fracture involving left hip.  Patient was hospitalized for further management.   Consultants: Orthopedics  Procedures: Plan is for surgery to treat hip fracture later today  Antibiotics: Anti-infectives (From admission, onward)   Start     Dose/Rate Route Frequency Ordered Stop   08/04/20 0600  ceFAZolin (ANCEF) IVPB 2g/100 mL premix        2 g 200 mL/hr over 30 Minutes Intravenous On call to O.R. 08/04/20 0330 08/05/20 0559      Subjective/Interval History: Patient complains of 7 out of 10 pain in the left hip area.  Denies any chest pain or shortness of breath.  No nausea or vomiting.    Assessment/Plan:  Intertrochanteric fracture of the left hip Orthopedics has been consulted.  Plan is for surgery later today.  Continue pain medications.  Bowel regimen. From a preoperative risk assessment standpoint patient does have a history of coronary artery disease.  She had stent placement in 2019.  She was on aspirin and Plavix.  Since her intervention was more than 2 years ago it is safe to hold her antiplatelet agents for her surgery.  EKG does not show any significant changes compared to previous EKG.  Based on the [facility] perioperative cardiac risk index her estimated risk probability for perioperative myocardial infarction or cardiac arrest is 1.62%.  Based on the ACC/AHA guidelines she may proceed to surgery.  No need for any cardiac testing prior to surgery.  History of coronary artery disease Status post MI in 2019 with cardiac cath revealing 95% RCA stenosis  which was followed by PCI with a drug-eluting stent.  Patient has been on aspirin and Plavix.  Safe to hold antiplatelet agents at this time.  Patient without any cardiac symptoms currently.  She is being continued on her beta-blocker.  Statin is also being continued.  Hyperlipidemia Continue statin.  Essential hypertension Elevated blood pressures could be due to pain issues.  Continue home medications.  She is on amlodipine and metoprolol.  Chronic kidney disease stage IIIa Renal function noted to be close to baseline.  Continue to monitor.  Interstitial lung disease/acute respiratory failure with hypoxia Her hypoxia most likely is due to narcotics.  Apparently her pulmonary fibrosis was diagnosed recently and she has been following with Dr. Lamonte Sakai.  Last seen by him in October 2021..  Does not use oxygen at home.  Continue to monitor closely.  Chest x-ray showed chronic findings.  Obesity Estimated body mass index is 30.16 kg/m as calculated from the following:   Height as of this encounter: 5\' 1"  (1.549 m).   Weight as of this encounter: 72.4 kg.   DVT Prophylaxis: SCDs for now Code Status: Full code Family Communication: No family at bedside.  Discussed with the patient Disposition Plan: Unclear for now  Status is: Inpatient  Remains inpatient appropriate because:Ongoing active pain requiring inpatient pain management and Inpatient level of care appropriate due to severity of illness   Dispo: The patient is from: Home              Anticipated d/c is to: To  be determined              Patient currently is not medically stable to d/c.   Difficult to place patient No     Medications:  Scheduled: . amitriptyline  20 mg Oral QHS  . amLODipine  7.5 mg Oral Daily  . atorvastatin  80 mg Oral q1800  . chlorhexidine  60 mL Topical Once  . docusate sodium  200 mg Oral Daily  . metoprolol tartrate  25 mg Oral BID  . povidone-iodine  2 application Topical Once  . sodium chloride  flush  3 mL Intravenous Q12H  . umeclidinium bromide  1 puff Inhalation Daily   Continuous: . sodium chloride    .  ceFAZolin (ANCEF) IV    . lactated ringers     XLK:GMWNUU chloride, acetaminophen **OR** acetaminophen, albuterol, HYDROmorphone (DILAUDID) injection, morphine injection, ondansetron **OR** ondansetron (ZOFRAN) IV, sodium chloride flush   Objective:  Vital Signs  Vitals:   08/04/20 0227 08/04/20 0653 08/04/20 0820 08/04/20 0937  BP:  (!) 145/78  133/70  Pulse: (!) 108 97  92  Resp:  16  13  Temp:  98.3 F (36.8 C)  98.4 F (36.9 C)  TempSrc:  Oral  Oral  SpO2: 94% 100% 92% 95%  Weight:      Height:        Intake/Output Summary (Last 24 hours) at 08/04/2020 1001 Last data filed at 08/04/2020 0600 Gross per 24 hour  Intake 60 ml  Output -  Net 60 ml   Filed Weights   08/04/20 0226  Weight: 72.4 kg    General appearance: Awake alert.  In no distress.  Noted to be in discomfort due to her left lower extremity pain. Resp: Clear to auscultation bilaterally.  Normal effort Cardio: S1-S2 is normal regular.  No S3-S4.  Systolic murmur appreciated over the precordium.  Not heard in the aortic area. GI: Abdomen is soft.  Nontender nondistended.  Bowel sounds are present normal.  No masses organomegaly Extremities: No edema.  Full range of motion of lower extremities. Neurologic: Alert and oriented x3.  No focal neurological deficits.    Lab Results:  Data Reviewed: I have personally reviewed following labs and imaging studies  CBC: Recent Labs  Lab 08/03/20 2016 08/04/20 0429  WBC 13.9* 14.9*  NEUTROABS 9.8*  --   HGB 11.4* 11.7*  HCT 35.2* 37.1  MCV 94.6 96.4  PLT 231 725    Basic Metabolic Panel: Recent Labs  Lab 08/03/20 2016 08/04/20 0429  NA 140 140  K 4.0 4.7  CL 107 106  CO2 24 23  GLUCOSE 141* 182*  BUN 24* 25*  CREATININE 1.02* 1.24*  CALCIUM 9.3 9.4    GFR: Estimated Creatinine Clearance: 31.8 mL/min (A) (by C-G formula based  on SCr of 1.24 mg/dL (H)).   Coagulation Profile: Recent Labs  Lab 08/03/20 2016  INR 1.0    HbA1C: Recent Labs    08/04/20 0429  HGBA1C 6.1*     Recent Results (from the past 240 hour(s))  Resp Panel by RT-PCR (Flu A&B, Covid) Nasopharyngeal Swab     Status: None   Collection Time: 08/03/20  9:39 PM   Specimen: Nasopharyngeal Swab; Nasopharyngeal(NP) swabs in vial transport medium  Result Value Ref Range Status   SARS Coronavirus 2 by RT PCR NEGATIVE NEGATIVE Final    Comment: (NOTE) SARS-CoV-2 target nucleic acids are NOT DETECTED.  The SARS-CoV-2 RNA is generally detectable in upper respiratory specimens  during the acute phase of infection. The lowest concentration of SARS-CoV-2 viral copies this assay can detect is 138 copies/mL. A negative result does not preclude SARS-Cov-2 infection and should not be used as the sole basis for treatment or other patient management decisions. A negative result may occur with  improper specimen collection/handling, submission of specimen other than nasopharyngeal swab, presence of viral mutation(s) within the areas targeted by this assay, and inadequate number of viral copies(<138 copies/mL). A negative result must be combined with clinical observations, patient history, and epidemiological information. The expected result is Negative.  Fact Sheet for Patients:  EntrepreneurPulse.com.au  Fact Sheet for Healthcare Providers:  IncredibleEmployment.be  This test is no t yet approved or cleared by the [place] FDA and  has been authorized for detection and/or diagnosis of SARS-CoV-2 by FDA under an Emergency Use Authorization (EUA). This EUA will remain  in effect (meaning this test can be used) for the duration of the COVID-19 declaration under Section 564(b)(1) of the Act, 21 U.S.C.section 360bbb-3(b)(1), unless the authorization is terminated  or revoked sooner.       Influenza A by  PCR NEGATIVE NEGATIVE Final   Influenza B by PCR NEGATIVE NEGATIVE Final    Comment: (NOTE) The Xpert Xpress SARS-CoV-2/FLU/RSV plus assay is intended as an aid in the diagnosis of influenza from Nasopharyngeal swab specimens and should not be used as a sole basis for treatment. Nasal washings and aspirates are unacceptable for Xpert Xpress SARS-CoV-2/FLU/RSV testing.  Fact Sheet for Patients: EntrepreneurPulse.com.au  Fact Sheet for Healthcare Providers: IncredibleEmployment.be  This test is not yet approved or cleared by the [place] FDA and has been authorized for detection and/or diagnosis of SARS-CoV-2 by FDA under an Emergency Use Authorization (EUA). This EUA will remain in effect (meaning this test can be used) for the duration of the COVID-19 declaration under Section 564(b)(1) of the Act, 21 U.S.C. section 360bbb-3(b)(1), unless the authorization is terminated or revoked.  Performed at [facility], [place] [address], [place], [place] [address]   Surgical PCR screen     Status: None   Collection Time: 08/04/20  2:51 AM   Specimen: Nasal Mucosa; Nasal Swab  Result Value Ref Range Status   MRSA, PCR NEGATIVE NEGATIVE Final   Staphylococcus aureus NEGATIVE NEGATIVE Final    Comment: (NOTE) The Xpert SA Assay (FDA approved for NASAL specimens in patients 48 years of age and older), is one component of a comprehensive surveillance program. It is not intended to diagnose infection nor to guide or monitor treatment. Performed at [facility], [place] [address], [place], [place] [address]       Radiology Studies: DG CHEST PORT 1 VIEW  Result Date: 08/04/2020 CLINICAL DATA:  Hypoxia EXAM: PORTABLE CHEST 1 VIEW COMPARISON:  02/08/2020 FINDINGS: Pulmonary insufflation has diminished slightly since prior examination though lung volumes are still normal. Superimposed interstitial thickening is again  identified, compatible with chronic interstitial changes noted on prior CT examination of 03/22/2020. No superimposed focal pulmonary infiltrate or nodule. No pneumothorax or pleural effusion. Cardiac size within normal limits. Pulmonary vascularity is normal. IMPRESSION: Stable chronic interstitial changes. No radiographic evidence of acute cardiopulmonary disease. Electronically Signed   By: Fidela Salisbury MD   On: 08/04/2020 00:23   DG Hip Unilat With Pelvis 2-3 Views Left  Result Date: 08/03/2020 CLINICAL DATA:  Fall, leg shortening EXAM: DG HIP (WITH OR WITHOUT PELVIS) 2-3V LEFT COMPARISON:  Radiograph 07/02/2020 FINDINGS: Comminuted fracture the proximal  left femur with intertrochanteric and subtrochanteric fracture lines. Slight varus angulation and minimal foreshortening. Femoral head remains normally located. No other acute fracture or traumatic osseous injury of the pelvis. Degenerative changes in the lower spine, SI joints and bilateral hips are mild-to-moderate. No suspicious lytic or blastic lesions. Left hip swelling is present. Remaining soft tissues are unremarkable. IMPRESSION: Comminuted fracture of the proximal left femur with intertrochanteric and subtrochanteric fracture lines. Slight foreshortening and varus angulation across the fracture. Electronically Signed   By: Lovena Le M.D.   On: 08/03/2020 19:58       LOS: 1 day   [facility] Pager on [email or web address]  08/04/2020, 10:01 AM

## 2020-08-04 NOTE — Progress Notes (Addendum)
Called to rm to check pt, O2 sat down to low 80"s. O2 currently on 2 L per [place], this was increased to 4 l with a jump in sats to low 90's fairly quickly. Pt tachy in the 110-120's, complains of nausea, skin cool, dry. Also noted zero urine output via purewick cath (linen under pt was completely dry). Will do bladder scan, MD notified of above.

## 2020-08-04 NOTE — Consult Note (Signed)
Reason for Consult:left hip fracture  Referring Physician: Dr Wilhemena Durie  Jacqueline Burke is an 85 y.o. female.  HPI: Patient is an ambulatory 85 year old female who fell at home yesterday.  She has had bilateral knees replaced.  She does take Plavix per cardiology recommendation for history of coronary artery disease.  She denies any syncopal episodes.  Has been unable to weight-bear since that time and denies any other orthopedic complaints  Past Medical History:  Diagnosis Date  . Allergic rhinitis   . CAD (coronary artery disease), native coronary artery    NSTEMI with 95% RCA s/p DES to RCA 06/2017  . Diverticulosis   . GERD (gastroesophageal reflux disease)   . Heart murmur    Systolic heart murmur with Aortic valve sclerosis by ECHO  . History of kidney stones   . Hyperlipidemia   . Hypertension   . Osteoarthritis    Erosive OA-MRI of R hand negative for synovitis, only OA-Dr Trudie Reed  . Osteopenia   . Pelvic prolapse   . PVC's (premature ventricular contractions)     Past Surgical History:  Procedure Laterality Date  . BUNIONECTOMY  08/2006   R foot and hammer roe right second toe  . CARDIAC CATHETERIZATION    . CARPAL TUNNEL RELEASE  2002   bil hands  . CORONARY STENT INTERVENTION N/A 06/15/2017   Procedure: CORONARY STENT INTERVENTION;  Surgeon: Lorretta Harp, MD;  Location: [facility];  Service: Cardiovascular;  Laterality: N/A;  . CYSTOSCOPY     with laser lithotripsy Dr. Gloriann Loan 07-20-17   . CYSTOSCOPY W/ URETERAL STENT PLACEMENT Left 06/09/2017   Procedure: CYSTOSCOPY WITH RETROGRADE PYELOGRAM/URETERAL STENT PLACEMENT;  Surgeon: Lucas Mallow, MD;  Location: [facility];  Service: Urology;  Laterality: Left;  . CYSTOSCOPY WITH RETROGRADE PYELOGRAM, URETEROSCOPY AND STENT PLACEMENT Left 07/20/2017   Procedure: CYSTOSCOPY WITH RETROGRADE PYELOGRAM, LEFT URETEROSCOPY HOLMIUM LASER LITHO  AND STENT EXCHANGE;  Surgeon: Lucas Mallow, MD;  Location: [facility];  Service:  Urology;  Laterality: Left;  . HOLMIUM LASER APPLICATION Left 0/30/0923   Procedure: HOLMIUM LASER APPLICATION;  Surgeon: Lucas Mallow, MD;  Location: [facility];  Service: Urology;  Laterality: Left;  . LEFT HEART CATH AND CORONARY ANGIOGRAPHY N/A 06/15/2017   Procedure: LEFT HEART CATH AND CORONARY ANGIOGRAPHY;  Surgeon: Lorretta Harp, MD;  Location: [facility];  Service: Cardiovascular;  Laterality: N/A;  . TOTAL KNEE ARTHROPLASTY Left 08/2007  . TOTAL KNEE ARTHROPLASTY Right 03/2010    Family History  Problem Relation Age of Onset  . Stroke Mother   . Diabetes Mother   . Heart attack Mother   . Osteoporosis Father   . Breast cancer Cousin     Social History:  reports that she quit smoking about 27 years ago. Her smoking use included cigarettes. She has a 25.00 pack-year smoking history. She has never used smokeless tobacco. She reports that she does not drink alcohol and does not use drugs.  Allergies:  Allergies  Allergen Reactions  . Ciprofloxacin Hives  . Codeine Hives  . Demerol [Meperidine] Hives  . Other Hives    Hydrogen peroxide Hydrogen peroxide  . Hydrogen Peroxide Other (See Comments)    White blisters  . Propoxyphene Other (See Comments)    Unknown Other reaction(s): OTHER  . Tramadol Other (See Comments)    "ITCHY ON THE INSIDE"    Medications: I have reviewed the patient's current medications.  Results for orders placed  or performed during the hospital encounter of 08/03/20 (from the past 48 hour(s))  Basic metabolic panel     Status: Abnormal   Collection Time: 08/03/20  8:16 PM  Result Value Ref Range   Sodium 140 135 - 145 mmol/L   Potassium 4.0 3.5 - 5.1 mmol/L   Chloride 107 98 - 111 mmol/L   CO2 24 22 - 32 mmol/L   Glucose, Bld 141 (H) 70 - 99 mg/dL    Comment: Glucose reference range applies only to samples taken after fasting for at least 8 hours.   BUN 24 (H) 8 - 23 mg/dL   Creatinine, Ser 1.02 (H) 0.44 - 1.00 mg/dL   Calcium  9.3 8.9 - 10.3 mg/dL   GFR, Estimated 55 (L) >60 mL/min    Comment: (NOTE) Calculated using the CKD-EPI Creatinine Equation (2021)    Anion gap 9 5 - 15    Comment: Performed at [facility], [place] [address] [place], [place] [address]  CBC with Differential     Status: Abnormal   Collection Time: 08/03/20  8:16 PM  Result Value Ref Range   WBC 13.9 (H) 4.0 - 10.5 K/uL   RBC 3.72 (L) 3.87 - 5.11 MIL/uL   Hemoglobin 11.4 (L) 12.0 - 15.0 g/dL   HCT 35.2 (L) 36.0 - 46.0 %   MCV 94.6 80.0 - 100.0 fL   MCH 30.6 26.0 - 34.0 pg   MCHC 32.4 30.0 - 36.0 g/dL   RDW 13.7 11.5 - 15.5 %   Platelets 231 150 - 400 K/uL   nRBC 0.0 0.0 - 0.2 %   Neutrophils Relative % 71 %   Neutro Abs 9.8 (H) 1.7 - 7.7 K/uL   Lymphocytes Relative 21 %   Lymphs Abs 2.9 0.7 - 4.0 K/uL   Monocytes Relative 6 %   Monocytes Absolute 0.9 0.1 - 1.0 K/uL   Eosinophils Relative 1 %   Eosinophils Absolute 0.2 0.0 - 0.5 K/uL   Basophils Relative 0 %   Basophils Absolute 0.0 0.0 - 0.1 K/uL   Immature Granulocytes 1 %   Abs Immature Granulocytes 0.16 (H) 0.00 - 0.07 K/uL    Comment: Performed at [facility], [place] [address], [place], [place] [address]  Protime-INR     Status: None   Collection Time: 08/03/20  8:16 PM  Result Value Ref Range   Prothrombin Time 13.2 11.4 - 15.2 seconds   INR 1.0 0.8 - 1.2    Comment: (NOTE) INR goal varies based on device and disease states. Performed at [facility], [place] [address] [place], [place] [address]   Type and screen [place]     Status: None   Collection Time: 08/03/20  8:16 PM  Result Value Ref Range   ABO/RH(D) O POS    Antibody Screen NEG    Sample Expiration      08/06/2020,2359 Performed at [facility], [place] [address], [place], [place] [address]   Resp Panel by RT-PCR (Flu A&B, Covid) Nasopharyngeal Swab     Status: None   Collection Time: 08/03/20  9:39 PM    Specimen: Nasopharyngeal Swab; Nasopharyngeal(NP) swabs in vial transport medium  Result Value Ref Range   SARS Coronavirus 2 by RT PCR NEGATIVE NEGATIVE    Comment: (NOTE) SARS-CoV-2 target nucleic acids are NOT DETECTED.  The SARS-CoV-2 RNA is generally detectable in upper respiratory specimens during the acute phase of infection. The lowest concentration  of SARS-CoV-2 viral copies this assay can detect is 138 copies/mL. A negative result does not preclude SARS-Cov-2 infection and should not be used as the sole basis for treatment or other patient management decisions. A negative result may occur with  improper specimen collection/handling, submission of specimen other than nasopharyngeal swab, presence of viral mutation(s) within the areas targeted by this assay, and inadequate number of viral copies(<138 copies/mL). A negative result must be combined with clinical observations, patient history, and epidemiological information. The expected result is Negative.  Fact Sheet for Patients:  EntrepreneurPulse.com.au  Fact Sheet for Healthcare Providers:  IncredibleEmployment.be  This test is no t yet approved or cleared by the [place] FDA and  has been authorized for detection and/or diagnosis of SARS-CoV-2 by FDA under an Emergency Use Authorization (EUA). This EUA will remain  in effect (meaning this test can be used) for the duration of the COVID-19 declaration under Section 564(b)(1) of the Act, 21 U.S.C.section 360bbb-3(b)(1), unless the authorization is terminated  or revoked sooner.       Influenza A by PCR NEGATIVE NEGATIVE   Influenza B by PCR NEGATIVE NEGATIVE    Comment: (NOTE) The Xpert Xpress SARS-CoV-2/FLU/RSV plus assay is intended as an aid in the diagnosis of influenza from Nasopharyngeal swab specimens and should not be used as a sole basis for treatment. Nasal washings and aspirates are unacceptable for Xpert Xpress  SARS-CoV-2/FLU/RSV testing.  Fact Sheet for Patients: EntrepreneurPulse.com.au  Fact Sheet for Healthcare Providers: IncredibleEmployment.be  This test is not yet approved or cleared by the [place] FDA and has been authorized for detection and/or diagnosis of SARS-CoV-2 by FDA under an Emergency Use Authorization (EUA). This EUA will remain in effect (meaning this test can be used) for the duration of the COVID-19 declaration under Section 564(b)(1) of the Act, 21 U.S.C. section 360bbb-3(b)(1), unless the authorization is terminated or revoked.  Performed at [facility], [place] [address] [place], [place] [address]   Surgical PCR screen     Status: None   Collection Time: 08/04/20  2:51 AM   Specimen: Nasal Mucosa; Nasal Swab  Result Value Ref Range   MRSA, PCR NEGATIVE NEGATIVE   Staphylococcus aureus NEGATIVE NEGATIVE    Comment: (NOTE) The Xpert SA Assay (FDA approved for NASAL specimens in patients 16 years of age and older), is one component of a comprehensive surveillance program. It is not intended to diagnose infection nor to guide or monitor treatment. Performed at [facility], [place] [address] [place], [place] [address]   Hemoglobin A1c     Status: Abnormal   Collection Time: 08/04/20  4:29 AM  Result Value Ref Range   Hgb A1c MFr Bld 6.1 (H) 4.8 - 5.6 %    Comment: (NOTE) Pre diabetes:          5.7%-6.4%  Diabetes:              >6.4%  Glycemic control for   <7.0% adults with diabetes    Mean Plasma Glucose 128.37 mg/dL    Comment: Performed at [place] [address], [place] [address]  CBC     Status: Abnormal   Collection Time: 08/04/20  4:29 AM  Result Value Ref Range   WBC 14.9 (H) 4.0 - 10.5 K/uL   RBC 3.85 (L) 3.87 - 5.11 MIL/uL   Hemoglobin 11.7 (L) 12.0 - 15.0 g/dL   HCT 37.1 36.0 - 46.0 %   MCV  96.4 80.0 - 100.0 fL   MCH 30.4 26.0 - 34.0 pg    MCHC 31.5 30.0 - 36.0 g/dL   RDW 13.9 11.5 - 15.5 %   Platelets 235 150 - 400 K/uL   nRBC 0.0 0.0 - 0.2 %    Comment: Performed at [facility], [place] [address], [place], [place] [address]  Basic metabolic panel     Status: Abnormal   Collection Time: 08/04/20  4:29 AM  Result Value Ref Range   Sodium 140 135 - 145 mmol/L   Potassium 4.7 3.5 - 5.1 mmol/L   Chloride 106 98 - 111 mmol/L   CO2 23 22 - 32 mmol/L   Glucose, Bld 182 (H) 70 - 99 mg/dL    Comment: Glucose reference range applies only to samples taken after fasting for at least 8 hours.   BUN 25 (H) 8 - 23 mg/dL   Creatinine, Ser 1.24 (H) 0.44 - 1.00 mg/dL   Calcium 9.4 8.9 - 10.3 mg/dL   GFR, Estimated 43 (L) >60 mL/min    Comment: (NOTE) Calculated using the CKD-EPI Creatinine Equation (2021)    Anion gap 11 5 - 15    Comment: Performed at [facility], [place] [address] [place], [place] [address]    DG CHEST PORT 1 VIEW  Result Date: 08/04/2020 CLINICAL DATA:  Hypoxia EXAM: PORTABLE CHEST 1 VIEW COMPARISON:  02/08/2020 FINDINGS: Pulmonary insufflation has diminished slightly since prior examination though lung volumes are still normal. Superimposed interstitial thickening is again identified, compatible with chronic interstitial changes noted on prior CT examination of 03/22/2020. No superimposed focal pulmonary infiltrate or nodule. No pneumothorax or pleural effusion. Cardiac size within normal limits. Pulmonary vascularity is normal. IMPRESSION: Stable chronic interstitial changes. No radiographic evidence of acute cardiopulmonary disease. Electronically Signed   By: Fidela Salisbury MD   On: 08/04/2020 00:23   DG Hip Unilat With Pelvis 2-3 Views Left  Result Date: 08/03/2020 CLINICAL DATA:  Fall, leg shortening EXAM: DG HIP (WITH OR WITHOUT PELVIS) 2-3V LEFT COMPARISON:  Radiograph 07/02/2020 FINDINGS: Comminuted fracture the proximal left femur with intertrochanteric and subtrochanteric  fracture lines. Slight varus angulation and minimal foreshortening. Femoral head remains normally located. No other acute fracture or traumatic osseous injury of the pelvis. Degenerative changes in the lower spine, SI joints and bilateral hips are mild-to-moderate. No suspicious lytic or blastic lesions. Left hip swelling is present. Remaining soft tissues are unremarkable. IMPRESSION: Comminuted fracture of the proximal left femur with intertrochanteric and subtrochanteric fracture lines. Slight foreshortening and varus angulation across the fracture. Electronically Signed   By: Lovena Le M.D.   On: 08/03/2020 19:58    Review of Systems  Musculoskeletal: Positive for arthralgias.  All other systems reviewed and are negative.  Blood pressure (!) 145/78, pulse 97, temperature 98.3 F (36.8 C), temperature source Oral, resp. rate 16, height 5\' 1"  (1.549 m), weight 72.4 kg, SpO2 92 %. Physical Exam HENT:     Head: Normocephalic.     Nose: Nose normal.     Mouth/Throat:     Mouth: Mucous membranes are moist.  Eyes:     Pupils: Pupils are equal, round, and reactive to light.  Cardiovascular:     Rate and Rhythm: Normal rate.     Pulses: Normal pulses.  Pulmonary:     Effort: Pulmonary effort is normal.  Abdominal:     General: Abdomen is flat.  Musculoskeletal:     Cervical back: Normal range  of motion.  Skin:    General: Skin is warm.     Capillary Refill: Capillary refill takes less than 2 seconds.  Neurological:     General: No focal deficit present.     Mental Status: She is alert.  Psychiatric:        Mood and Affect: Mood normal.   Examination of bilateral upper extremities demonstrates excellent range of motion bilateral wrist elbows shoulders.  Right lower extremity has well-healed knee replacement incision with good knee range of motion hip range of motion ankle range of motion.  On the left-hand side foot is perfused.  Pedal pulses palpable.  No knee effusion.  Patient  holds the leg externally rotated and flexed.  Ankle dorsiflexion intact  Assessment/Plan: Impression is left hip fracture in a patient with coronary disease who has been on Plavix.  Plan is left hip intramedullary screw placement with possible cabling of the subtrochanteric fracture.  Risk and benefits are discussed with the patient include not limited to infection nerve vessel damage nonunion malunion potential need for more surgery.  Patient understands risk benefits.  All questions answered.  Plan to do surgery today or tomorrow.  Patient did take Plavix yesterday morning.  Landry Dyke [place] 08/04/2020, 9:23 AM

## 2020-08-04 NOTE — Progress Notes (Signed)
Patient last took Plavix and aspirin yesterday morning.  Plan to wait 36 hours which is just shy of the recommended 48 hours for some elective surgery.  Patient is having a fair amount of pain.  I think that we can safely do the surgery with TXA later this afternoon.  We will have to place a cable around the femur which has the potential to increase blood loss.  Will be somewhat more invasive surgery than standard intertrochanteric hip fracture surgery.

## 2020-08-04 NOTE — Progress Notes (Signed)
Unable to wake pt up, called rapid response to r/o stroke. @2157 . Gennell came down to evaluated pt and Dustin CRNA came to check pt.  Pt followed command but speech gobbled, drowzy. Narcan 0.2mg  IV given @ 2226,  Pt awake, alert ,oriented x1 moves all extremities Severe pain, robaxin 500 mg IV given , ofirmev 1g IV given

## 2020-08-04 NOTE — Progress Notes (Signed)
Pt is getting aggressive pulled out monitors. Got oders tx to progress.

## 2020-08-04 NOTE — Transfer of Care (Signed)
Immediate Anesthesia Transfer of Care Note  Patient: Jacqueline Burke  Procedure(s) Performed: Procedure(s): INTRAMEDULLARY (IM) NAIL INTERTROCHANTRIC WITH CABLES (Left)  Patient Location: PACU  Anesthesia Type:General  Level of Consciousness: Alert, Awake, Oriented  Airway & Oxygen Therapy: Patient Spontanous Breathing  Post-op Assessment: Report given to RN  Post vital signs: Reviewed and stable  Last Vitals:  Vitals:   08/04/20 1523 08/04/20 1650  BP:  99/62  Pulse:  84  Resp:  14  Temp:  37 C  SpO2: 44% 97%    Complications: No apparent anesthesia complications

## 2020-08-04 NOTE — Progress Notes (Signed)
RT covering PACU made this RT aware of ordered/pending ABG once pt. arrives to floor if remains needing drawn, RT to monitor.

## 2020-08-05 ENCOUNTER — Encounter (HOSPITAL_COMMUNITY): Payer: Self-pay | Admitting: Family Medicine

## 2020-08-05 DIAGNOSIS — R451 Restlessness and agitation: Secondary | ICD-10-CM

## 2020-08-05 DIAGNOSIS — D62 Acute posthemorrhagic anemia: Secondary | ICD-10-CM

## 2020-08-05 LAB — BLOOD GAS, ARTERIAL
Acid-base deficit: 3.2 mmol/L — ABNORMAL HIGH (ref 0.0–2.0)
Bicarbonate: 21.6 mmol/L (ref 20.0–28.0)
Drawn by: 22653
O2 Content: 2 L/min
O2 Saturation: 91.7 %
Patient temperature: 99
pCO2 arterial: 41.1 mmHg (ref 32.0–48.0)
pH, Arterial: 7.342 — ABNORMAL LOW (ref 7.350–7.450)
pO2, Arterial: 67.7 mmHg — ABNORMAL LOW (ref 83.0–108.0)

## 2020-08-05 LAB — CBC
HCT: 23.7 % — ABNORMAL LOW (ref 36.0–46.0)
Hemoglobin: 7.6 g/dL — ABNORMAL LOW (ref 12.0–15.0)
MCH: 30.8 pg (ref 26.0–34.0)
MCHC: 32.1 g/dL (ref 30.0–36.0)
MCV: 96 fL (ref 80.0–100.0)
Platelets: 143 10*3/uL — ABNORMAL LOW (ref 150–400)
RBC: 2.47 MIL/uL — ABNORMAL LOW (ref 3.87–5.11)
RDW: 14.1 % (ref 11.5–15.5)
WBC: 12.9 10*3/uL — ABNORMAL HIGH (ref 4.0–10.5)
nRBC: 0 % (ref 0.0–0.2)

## 2020-08-05 LAB — HEMOGLOBIN AND HEMATOCRIT, BLOOD
HCT: 22 % — ABNORMAL LOW (ref 36.0–46.0)
Hemoglobin: 7.1 g/dL — ABNORMAL LOW (ref 12.0–15.0)

## 2020-08-05 LAB — BASIC METABOLIC PANEL
Anion gap: 10 (ref 5–15)
BUN: 29 mg/dL — ABNORMAL HIGH (ref 8–23)
CO2: 22 mmol/L (ref 22–32)
Calcium: 8 mg/dL — ABNORMAL LOW (ref 8.9–10.3)
Chloride: 107 mmol/L (ref 98–111)
Creatinine, Ser: 1.33 mg/dL — ABNORMAL HIGH (ref 0.44–1.00)
GFR, Estimated: 40 mL/min — ABNORMAL LOW (ref >60)
Glucose, Bld: 175 mg/dL — ABNORMAL HIGH (ref 70–99)
Potassium: 4.1 mmol/L (ref 3.5–5.1)
Sodium: 139 mmol/L (ref 135–145)

## 2020-08-05 LAB — PREPARE RBC (CROSSMATCH)

## 2020-08-05 MED ORDER — LACTATED RINGERS IV SOLN: INTRAVENOUS | Status: AC

## 2020-08-05 MED ORDER — METOCLOPRAMIDE HCL 5 MG/ML IJ SOLN: 5.0000 mg | Freq: Three times a day (TID) | INTRAMUSCULAR | Status: DC | PRN

## 2020-08-05 MED ORDER — ONDANSETRON HCL 4 MG/2ML IJ SOLN: 4.0000 mg | Freq: Four times a day (QID) | INTRAMUSCULAR | Status: DC | PRN

## 2020-08-05 MED ORDER — ONDANSETRON HCL 4 MG PO TABS: 4.0000 mg | ORAL_TABLET | Freq: Four times a day (QID) | ORAL | Status: DC | PRN

## 2020-08-05 MED ORDER — ACETAMINOPHEN 500 MG PO TABS: 500.0000 mg | ORAL_TABLET | Freq: Four times a day (QID) | ORAL | Status: DC

## 2020-08-05 MED ORDER — ACETAMINOPHEN 10 MG/ML IV SOLN
1000.0000 mg | Freq: Four times a day (QID) | INTRAVENOUS | Status: AC
  Administered 2020-08-05 (×4): 1000 mg via INTRAVENOUS
  Filled 2020-08-05 (×4): qty 100

## 2020-08-05 MED ORDER — GUAIFENESIN ER 600 MG PO TB12
600.0000 mg | ORAL_TABLET | Freq: Two times a day (BID) | ORAL | Status: DC
  Administered 2020-08-05 – 2020-08-07 (×6): 600 mg via ORAL
  Filled 2020-08-05 (×6): qty 1

## 2020-08-05 MED ORDER — METOCLOPRAMIDE HCL 5 MG PO TABS: 5.0000 mg | ORAL_TABLET | Freq: Three times a day (TID) | ORAL | Status: DC | PRN

## 2020-08-05 MED ORDER — MORPHINE SULFATE (PF) 2 MG/ML IV SOLN: 0.5000 mg | INTRAVENOUS | Status: DC | PRN

## 2020-08-05 MED ORDER — ACETAMINOPHEN 325 MG PO TABS
650.0000 mg | ORAL_TABLET | Freq: Four times a day (QID) | ORAL | Status: DC | PRN
  Administered 2020-08-07 (×2): 650 mg via ORAL
  Filled 2020-08-05 (×2): qty 2

## 2020-08-05 MED ORDER — ACETAMINOPHEN 500 MG PO TABS: 1000.0000 mg | ORAL_TABLET | Freq: Once | ORAL | Status: DC

## 2020-08-05 MED ORDER — HYDROCODONE-ACETAMINOPHEN 7.5-325 MG PO TABS: 1.0000 | ORAL_TABLET | ORAL | Status: DC | PRN

## 2020-08-05 MED ORDER — DOCUSATE SODIUM 100 MG PO CAPS: 100.0000 mg | ORAL_CAPSULE | Freq: Two times a day (BID) | ORAL | Status: DC

## 2020-08-05 MED ORDER — HYDROCODONE-ACETAMINOPHEN 7.5-325 MG PO TABS
1.0000 | ORAL_TABLET | ORAL | Status: DC | PRN
Start: 2020-08-05 — End: 2020-08-05

## 2020-08-05 MED ORDER — PHENOL 1.4 % MT LIQD: 1.0000 | OROMUCOSAL | Status: DC | PRN

## 2020-08-05 MED ORDER — MENTHOL 3 MG MT LOZG: 1.0000 | LOZENGE | OROMUCOSAL | Status: DC | PRN

## 2020-08-05 MED ORDER — SODIUM CHLORIDE 0.9% IV SOLUTION: Freq: Once | INTRAVENOUS | Status: AC

## 2020-08-05 MED ORDER — ACETAMINOPHEN 650 MG RE SUPP: 650.0000 mg | Freq: Four times a day (QID) | RECTAL | Status: DC | PRN

## 2020-08-05 MED ORDER — GUAIFENESIN 100 MG/5ML PO SOLN: 5.0000 mL | ORAL | Status: DC | PRN

## 2020-08-05 MED ORDER — HALOPERIDOL LACTATE 5 MG/ML IJ SOLN: 0.5000 mg | Freq: Four times a day (QID) | INTRAMUSCULAR | Status: DC | PRN

## 2020-08-05 NOTE — Anesthesia Postprocedure Evaluation (Signed)
Anesthesia Post Note  Patient: Jacqueline Burke  Procedure(s) Performed: INTRAMEDULLARY (IM) NAIL INTERTROCHANTRIC WITH CABLES (Left )     Patient location during evaluation: PACU Anesthesia Type: General Level of consciousness: awake and alert and oriented Pain management: pain level controlled Vital Signs Assessment: post-procedure vital signs reviewed and stable Respiratory status: spontaneous breathing, nonlabored ventilation and respiratory function stable Cardiovascular status: blood pressure returned to baseline Postop Assessment: no apparent nausea or vomiting Anesthetic complications: no Comments: Patient initially awake and alert, answering questions appropriately in PACU. No additional opioids given other than morphine 8mg  given by surgeon to operative site. Patient became progressively more somnolent over the next hour and was given narcan with improvement in mental status. Continues to require [place] to maintain SpO2 90s as prior to surgery. Daiva Huge, MD   No complications documented.  Last Vitals:  Vitals:   08/05/20 1428 08/05/20 1454  BP: 139/63 119/62  Pulse: 93 95  Resp: 20 19  Temp: 37 C 36.9 C  SpO2: 93% 95%    Last Pain:  Vitals:   08/05/20 1454  TempSrc: Oral  PainSc:                  [name]

## 2020-08-05 NOTE — Evaluation (Addendum)
Physical Therapy Evaluation Patient Details Name: Jacqueline Burke MRN: [id number] DOB: 06/22/1935 Today's Date: 08/05/2020   History of Present Illness  Pt s/p fall with L hip fx and now s/p IM nailing with cables.  Pt with hx of CAD and bil TKR  Clinical Impression  Pt admitted as above and presenting with functional mobility limitations 2* decreased L LE strength/ROM, post op pain, balance deficits and c/o dizziness with move to EOB sitting (BP 92/56).  Pt very motivated and expressing the preference to return home with spouse but states she understands that follow up rehab at SNF level may be the better option initially.  Per RN, pt very confused following surgery and found in UE restraints in room.  Pt very coherent with PT and following all cues and answering questions appropriately.  Restraints left off at session end with RN permission and family remaining in room    Follow Up Recommendations SNF    Equipment Recommendations  Wheelchair (measurements PT) (if pt returns home)    Recommendations for Other Services OT consult     Precautions / Restrictions Precautions Precautions: Fall Restrictions Weight Bearing Restrictions: Yes LLE Weight Bearing: Non weight bearing Other Position/Activity Restrictions: TDWB for transfers only      Mobility  Bed Mobility Overal bed mobility: Needs Assistance Bed Mobility: Supine to Sit;Sit to Supine     Supine to sit: Mod assist;+2 for physical assistance Sit to supine: Max assist;+2 for physical assistance   General bed mobility comments: cues for sequence and use of R LE to self assist.  Physical assist to manage L LE, to control trunk and to complete transitions using bed pad    Transfers                 General transfer comment: Pt dizzy in sitting and did not resolve with increased time at EOB so tranfers deferred  Ambulation/Gait                Stairs            Wheelchair Mobility    Modified Rankin  (Stroke Patients Only)       Balance Overall balance assessment: Needs assistance Sitting-balance support: Single extremity supported;Feet supported Sitting balance-Leahy Scale: Fair                                       Pertinent Vitals/Pain Pain Assessment: Faces Faces Pain Scale: Hurts little more Pain Location: L LE with mobility Pain Descriptors / Indicators: Aching;Sore;Grimacing Pain Intervention(s): Limited activity within patient's tolerance;Monitored during session;Ice applied    Home Living Family/patient expects to be discharged to:: Unsure Living Arrangements: Spouse/significant other Available Help at Discharge: Family;Available 24 hours/day Type of Home: House Home Access: Stairs to enter   [employer] of Steps: 3 Home Layout: Two level Home Equipment: Walker - 2 wheels      Prior Function Level of Independence: Independent               Hand Dominance        Extremity/Trunk Assessment   Upper Extremity Assessment Upper Extremity Assessment: Overall WFL for tasks assessed    Lower Extremity Assessment Lower Extremity Assessment: LLE deficits/detail       Communication   Communication: No difficulties  Cognition Arousal/Alertness: Awake/alert Behavior During Therapy: WFL for tasks assessed/performed Overall Cognitive Status: Within Functional Limits for tasks assessed  General Comments      Exercises     Assessment/Plan    PT Assessment Patient needs continued PT services  PT Problem List Decreased strength;Decreased activity tolerance;Decreased range of motion;Decreased balance;Decreased mobility;Decreased knowledge of use of DME;Pain       PT Treatment Interventions DME instruction;Gait training;Functional mobility training;Therapeutic activities;Therapeutic exercise;Balance training;Patient/family education    PT Goals (Current goals can be  found in the Care Plan section)  Acute Rehab PT Goals Patient Stated Goal: Regain IND PT Goal Formulation: With patient Time For Goal Achievement: 08/05/20 Potential to Achieve Goals: Good    Frequency Min 3X/week   Barriers to discharge        Co-evaluation               AM-PAC PT "6 Clicks" Mobility  Outcome Measure Help needed turning from your back to your side while in a flat bed without using bedrails?: A Lot Help needed moving from lying on your back to sitting on the side of a flat bed without using bedrails?: A Lot Help needed moving to and from a bed to a chair (including a wheelchair)?: A Lot Help needed standing up from a chair using your arms (e.g., wheelchair or bedside chair)?: A Lot Help needed to walk in hospital room?: Total Help needed climbing 3-5 steps with a railing? : Total 6 Click Score: 10    End of Session Equipment Utilized During Treatment: Oxygen;Gait belt Activity Tolerance: Patient limited by fatigue Patient left: in bed;with call bell/phone within reach;with family/visitor present Nurse Communication: Mobility status PT Visit Diagnosis: Difficulty in walking, not elsewhere classified (R26.2);Pain;History of falling (Z91.81) Pain - Right/Left: Left Pain - part of body: Leg    Time: 3354-5625 PT Time Calculation (min) (ACUTE ONLY): 33 min   Charges:   PT Evaluation $PT Eval Low Complexity: 1 Low PT Treatments $Therapeutic Activity: 8-22 mins        Debe Coder PT Acute Rehabilitation Services Pager [phone] Office [phone]   Zellie Jenning 08/05/2020, 1:33 PM

## 2020-08-05 NOTE — Progress Notes (Signed)
[facility] PROGRESS NOTE   Jacqueline Burke EVO:[id number] DOB: [birth year] DOA: 08/03/2020  PCP: Glenis Smoker, MD  Brief History/Interval Summary: 85 y.o. female with medical history significant of HTN, PVCs, ASCAD s/p NSTEMI in setting of acute respiratory failure and e.coli bacteremia in January of 2019, pulmonary fibrosis, CKD stage 3 and allergies who presented to ER via EMS after fall.   She sustained a fracture involving left hip.  Patient was hospitalized for further management.   Consultants: Orthopedics  Procedures: Intra medullary nail left hip on 3/5  Antibiotics: Anti-infectives (From admission, onward)   Start     Dose/Rate Route Frequency Ordered Stop   08/05/20 0200  ceFAZolin (ANCEF) IVPB 2g/100 mL premix        2 g 200 mL/hr over 30 Minutes Intravenous Every 8 hours 08/04/20 2234 08/05/20 0949   08/04/20 1958  vancomycin (VANCOCIN) powder  Status:  Discontinued          As needed 08/04/20 1958 08/04/20 2354   08/04/20 0600  ceFAZolin (ANCEF) IVPB 2g/100 mL premix        2 g 200 mL/hr over 30 Minutes Intravenous On call to O.R. 08/04/20 0330 08/04/20 1752      Subjective/Interval History: Overnight events noted.  Patient got very agitated and verbally abusive.  Likely due to medication effect and anesthesia.  No focal deficits were noted.  Patient remains confused this morning.      Assessment/Plan:  Intertrochanteric fracture of the left hip Orthopedics was consulted and the patient underwent surgery on 3/5.  PT and OT eval is pending.  Will likely need to go to skilled nursing facility for short-term rehab.  Continue pain medications and bowel regimen.    Agitation Patient quite agitated overnight.  Noted to be paranoid this morning.  All of this is likely due to pain medications and being in the hospital.  She does not have any focal neurological deficits on examination.  Reorient daily.  May need to use Haldol if she continues to have  significant agitation.  Acute urinary retention A Foley catheter had to be placed yesterday due to urinary retention.  Looks like was discontinued overnight.  Monitor urine output.  Bladder scans every shift for now.  Normocytic anemia/acute blood loss anemia Drop in hemoglobin likely due to combination of dilution as well as operative losses.  No obvious bruising noted over the lower extremity although the operative site is swollen.  We will recheck labs later today and tomorrow.  May need blood transfusion.  History of coronary artery disease Status post MI in 2019 with cardiac cath revealing 95% RCA stenosis which was followed by PCI with a drug-eluting stent.  Patient has been on aspirin and Plavix.   Antiplatelet agents were held for surgery.  Looks like they have been resumed this morning.  Stable from a cardiac standpoint.  Continue beta-blocker and statin.    Hyperlipidemia Continue statin.  Essential hypertension Stable.  Continue home medications.  She is on amlodipine and metoprolol.  Acute on chronic kidney disease stage IIIa Creatinine noted to be slightly higher today compared to yesterday along with high BUN.  Likely due to poor oral intake.  Will give a gentle IV hydration for 24 hours.    Interstitial lung disease/acute respiratory failure with hypoxia Her hypoxia most likely is due to narcotics.  Apparently her pulmonary fibrosis was diagnosed recently and she has been following with Dr. Lamonte Sakai.  Last seen by him in October 2021.Marland Kitchen  Does not use oxygen at home.  Respiratory status noted to be stable.  Currently on 3 L of oxygen by nasal cannula.  Can be weaned down to maintain sats greater than 90%. Chest x-ray showed chronic findings.  Obesity Estimated body mass index is 31.41 kg/m as calculated from the following:   Height as of this encounter: 5\' 1"  (1.549 m).   Weight as of this encounter: 75.4 kg.   DVT Prophylaxis: SCDs for now Code Status: Full code Family  Communication: No family at bedside.  Will update her husband. Disposition Plan: Await PT and OT evaluation.  Status is: Inpatient  Remains inpatient appropriate because:Ongoing active pain requiring inpatient pain management and Inpatient level of care appropriate due to severity of illness   Dispo: The patient is from: Home              Anticipated d/c is to: To be determined              Patient currently is not medically stable to d/c.   Difficult to place patient No     Medications:  Scheduled: . amitriptyline  20 mg Oral QHS  . amLODipine  7.5 mg Oral Daily  . aspirin  81 mg Oral Daily  . atorvastatin  80 mg Oral q1800  . Chlorhexidine Gluconate Cloth  6 each Topical Daily  . clopidogrel  75 mg Oral Daily  . docusate sodium  200 mg Oral Daily  . metoprolol tartrate  12.5 mg Oral BID  . senna-docusate  2 tablet Oral QHS  . sodium chloride flush  3 mL Intravenous Q12H  . umeclidinium bromide  1 puff Inhalation Daily   Continuous: . sodium chloride 250 mL (08/05/20 0103)  . acetaminophen 1,000 mg (08/05/20 0629)  . methocarbamol (ROBAXIN) IV Stopped (08/04/20 2319)   HCW:CBJSEG chloride, [START ON 08/06/2020] acetaminophen **OR** [START ON 08/06/2020] acetaminophen, albuterol, [START ON 08/06/2020] HYDROcodone-acetaminophen, menthol-cetylpyridinium **OR** phenol, methocarbamol **OR** methocarbamol (ROBAXIN) IV, metoCLOPramide **OR** metoCLOPramide (REGLAN) injection, morphine injection, naloxone, ondansetron **OR** ondansetron (ZOFRAN) IV, polyethylene glycol, sodium chloride flush   Objective:  Vital Signs  Vitals:   08/05/20 0000 08/05/20 0002 08/05/20 0130 08/05/20 0254  BP:  (!) 119/58 (!) 111/55 (!) 121/53  Pulse:  (!) 106 98 (!) 103  Resp:  18 16 17   Temp:  99 F (37.2 C) 99.3 F (37.4 C) 99.3 F (37.4 C)  TempSrc:    Axillary  SpO2:  96% 94% 100%  Weight: 75.4 kg     Height:        Intake/Output Summary (Last 24 hours) at 08/05/2020 1030 Last data filed at  08/05/2020 0700 Gross per 24 hour  Intake 2638.01 ml  Output 1325 ml  Net 1313.01 ml   Filed Weights   08/04/20 0226 08/05/20 0000  Weight: 72.4 kg 75.4 kg    General appearance: Awake alert.  In no distress.  Somewhat agitated Resp: Clear to auscultation bilaterally.  Normal effort Cardio: S1-S2 is normal regular.  No S3-S4.   GI: Abdomen is soft.  Nontender nondistended.  Bowel sounds are present normal.  No masses organomegaly Extremities: No edema.  Full range of motion of lower extremities. Neurologic: She is awake alert.  No facial asymmetry.  Motor strength equal bilateral upper and lower extremities.  Does not answer any orientation questions.  Noted to be paranoid.     Lab Results:  Data Reviewed: I have personally reviewed following labs and imaging studies  CBC: Recent Labs  Lab  08/03/20 2016 08/04/20 0429 08/05/20 0401  WBC 13.9* 14.9* 12.9*  NEUTROABS 9.8*  --   --   HGB 11.4* 11.7* 7.6*  HCT 35.2* 37.1 23.7*  MCV 94.6 96.4 96.0  PLT 231 235 143*    Basic Metabolic Panel: Recent Labs  Lab 08/03/20 2016 08/04/20 0429 08/05/20 0401  NA 140 140 139  K 4.0 4.7 4.1  CL 107 106 107  CO2 24 23 22   GLUCOSE 141* 182* 175*  BUN 24* 25* 29*  CREATININE 1.02* 1.24* 1.33*  CALCIUM 9.3 9.4 8.0*    GFR: Estimated Creatinine Clearance: 30.3 mL/min (A) (by C-G formula based on SCr of 1.33 mg/dL (H)).   Coagulation Profile: Recent Labs  Lab 08/03/20 2016  INR 1.0    HbA1C: Recent Labs    08/04/20 0429  HGBA1C 6.1*     Recent Results (from the past 240 hour(s))  Resp Panel by RT-PCR (Flu A&B, Covid) Nasopharyngeal Swab     Status: None   Collection Time: 08/03/20  9:39 PM   Specimen: Nasopharyngeal Swab; Nasopharyngeal(NP) swabs in vial transport medium  Result Value Ref Range Status   SARS Coronavirus 2 by RT PCR NEGATIVE NEGATIVE Final    Comment: (NOTE) SARS-CoV-2 target nucleic acids are NOT DETECTED.  The SARS-CoV-2 RNA is generally  detectable in upper respiratory specimens during the acute phase of infection. The lowest concentration of SARS-CoV-2 viral copies this assay can detect is 138 copies/mL. A negative result does not preclude SARS-Cov-2 infection and should not be used as the sole basis for treatment or other patient management decisions. A negative result may occur with  improper specimen collection/handling, submission of specimen other than nasopharyngeal swab, presence of viral mutation(s) within the areas targeted by this assay, and inadequate number of viral copies(<138 copies/mL). A negative result must be combined with clinical observations, patient history, and epidemiological information. The expected result is Negative.  Fact Sheet for Patients:  EntrepreneurPulse.com.au  Fact Sheet for Healthcare Providers:  IncredibleEmployment.be  This test is no t yet approved or cleared by the [place] FDA and  has been authorized for detection and/or diagnosis of SARS-CoV-2 by FDA under an Emergency Use Authorization (EUA). This EUA will remain  in effect (meaning this test can be used) for the duration of the COVID-19 declaration under Section 564(b)(1) of the Act, 21 U.S.C.section 360bbb-3(b)(1), unless the authorization is terminated  or revoked sooner.       Influenza A by PCR NEGATIVE NEGATIVE Final   Influenza B by PCR NEGATIVE NEGATIVE Final    Comment: (NOTE) The Xpert Xpress SARS-CoV-2/FLU/RSV plus assay is intended as an aid in the diagnosis of influenza from Nasopharyngeal swab specimens and should not be used as a sole basis for treatment. Nasal washings and aspirates are unacceptable for Xpert Xpress SARS-CoV-2/FLU/RSV testing.  Fact Sheet for Patients: EntrepreneurPulse.com.au  Fact Sheet for Healthcare Providers: IncredibleEmployment.be  This test is not yet approved or cleared by the [place] FDA  and has been authorized for detection and/or diagnosis of SARS-CoV-2 by FDA under an Emergency Use Authorization (EUA). This EUA will remain in effect (meaning this test can be used) for the duration of the COVID-19 declaration under Section 564(b)(1) of the Act, 21 U.S.C. section 360bbb-3(b)(1), unless the authorization is terminated or revoked.  Performed at [facility], [place] [address] [place], [place] [address]   Surgical PCR screen     Status: None   Collection Time: 08/04/20  2:51 AM  Specimen: Nasal Mucosa; Nasal Swab  Result Value Ref Range Status   MRSA, PCR NEGATIVE NEGATIVE Final   Staphylococcus aureus NEGATIVE NEGATIVE Final    Comment: (NOTE) The Xpert SA Assay (FDA approved for NASAL specimens in patients 68 years of age and older), is one component of a comprehensive surveillance program. It is not intended to diagnose infection nor to guide or monitor treatment. Performed at [facility], [place] [address], [place], [place] [address]       Radiology Studies: Pelvis Portable  Result Date: 08/04/2020 CLINICAL DATA:  Postop left hip fracture fixation. EXAM: PORTABLE PELVIS 1-2 VIEWS COMPARISON:  Preoperative radiograph yesterday. FINDINGS: Intramedullary nail with trans trochanteric screw fixation of proximal femur fracture. Fracture is in improved alignment compared to preoperative imaging. The distal aspect of the femoral nail is not included in the field of view. Recent postsurgical change includes air and edema in the soft tissues. IMPRESSION: ORIF of proximal femur fracture without immediate postoperative complication. Distal aspect of the femoral nail is not included in the field of view. Electronically Signed   By: Keith Rake M.D.   On: 08/04/2020 23:11   DG CHEST PORT 1 VIEW  Result Date: 08/04/2020 CLINICAL DATA:  Hypoxia EXAM: PORTABLE CHEST 1 VIEW COMPARISON:  02/08/2020 FINDINGS: Pulmonary insufflation has  diminished slightly since prior examination though lung volumes are still normal. Superimposed interstitial thickening is again identified, compatible with chronic interstitial changes noted on prior CT examination of 03/22/2020. No superimposed focal pulmonary infiltrate or nodule. No pneumothorax or pleural effusion. Cardiac size within normal limits. Pulmonary vascularity is normal. IMPRESSION: Stable chronic interstitial changes. No radiographic evidence of acute cardiopulmonary disease. Electronically Signed   By: Fidela Salisbury MD   On: 08/04/2020 00:23   DG C-Arm 1-60 Min-No Report  Result Date: 08/04/2020 Fluoroscopy was utilized by the requesting physician.  No radiographic interpretation.   DG HIP OPERATIVE UNILAT W OR W/O PELVIS LEFT  Result Date: 08/04/2020 CLINICAL DATA:  Left femoral IM nail. EXAM: OPERATIVE LEFT HIP (WITH PELVIS IF PERFORMED) TECHNIQUE: Fluoroscopic spot image(s) were submitted for interpretation post-operatively. COMPARISON:  Preoperative hip radiograph yesterday. FINDINGS: Four fluoroscopic spot views obtained in the operating room in frontal and lateral projections. Intramedullary nail with trans trochanteric and distal locking screws traverses intertrochanteric femur fracture. Improved alignment from preoperative imaging. Knee arthroplasty is partially included. Fluoroscopy time 1 minutes 14 seconds. Dose 19.1 mGy. IMPRESSION: Fluoroscopic spot views after ORIF left proximal femur fracture. Electronically Signed   By: Keith Rake M.D.   On: 08/04/2020 20:31   DG Hip Unilat With Pelvis 2-3 Views Left  Result Date: 08/03/2020 CLINICAL DATA:  Fall, leg shortening EXAM: DG HIP (WITH OR WITHOUT PELVIS) 2-3V LEFT COMPARISON:  Radiograph 07/02/2020 FINDINGS: Comminuted fracture the proximal left femur with intertrochanteric and subtrochanteric fracture lines. Slight varus angulation and minimal foreshortening. Femoral head remains normally located. No other acute fracture  or traumatic osseous injury of the pelvis. Degenerative changes in the lower spine, SI joints and bilateral hips are mild-to-moderate. No suspicious lytic or blastic lesions. Left hip swelling is present. Remaining soft tissues are unremarkable. IMPRESSION: Comminuted fracture of the proximal left femur with intertrochanteric and subtrochanteric fracture lines. Slight foreshortening and varus angulation across the fracture. Electronically Signed   By: Lovena Le M.D.   On: 08/03/2020 19:58       LOS: 2 days   [facility] Pager on [email or web address]  08/05/2020, 10:30 AM

## 2020-08-05 NOTE — Progress Notes (Signed)
Patient less confused, educated patient/family and restraint removal criteria, patient verbalized understanding, will continue to assess patient, now off restraint.

## 2020-08-05 NOTE — Plan of Care (Signed)
  Problem: Pain Managment: Goal: General experience of comfort will improve Outcome: Progressing   Problem: Safety: Goal: Ability to remain free from injury will improve Outcome: Progressing   Problem: Skin Integrity: Goal: Risk for impaired skin integrity will decrease Outcome: Progressing   Problem: Clinical Measurements: Goal: Postoperative complications will be avoided or minimized Outcome: Progressing   Problem: Pain Management: Goal: Pain level will decrease with appropriate interventions Outcome: Progressing

## 2020-08-05 NOTE — Progress Notes (Signed)
  Subjective: Jacqueline Burke is a 85 y.o. female s/p left hip IM nail.  They are POD 1.  Pt's pain is controlled. She was agitated last night and she is currently in restraints but does not seem agitated at this time. She understands where she is and that she has had femur fracture with surgical fixation. She has not ambulated yet and she is currently nonweightbearing. Denies lightheadedness and dizziness.  Objective: Vital signs in last 24 hours: Temp:  [97.6 F (36.4 C)-99.3 F (37.4 C)] 99.3 F (37.4 C) (03/06 0254) Pulse Rate:  [84-116] 103 (03/06 0254) Resp:  [12-20] 17 (03/06 0254) BP: (85-133)/(48-108) 121/53 (03/06 0254) SpO2:  [82 %-100 %] 100 % (03/06 0254) Weight:  [75.4 kg] 75.4 kg (03/06 0000)  Intake/Output from previous day: 03/05 0701 - 03/06 0700 In: 2638 [I.V.:2188; IV Piggyback:450] Out: 1325 [HQPRF:1638; Blood:150] Intake/Output this shift: No intake/output data recorded.  Exam:  No gross blood or drainage overlying the dressing 1+ DP pulse Sensation intact distally in the left foot Able to dorsiflex and plantarflex the left foot   Labs: Recent Labs    08/03/20 2016 08/04/20 0429 08/05/20 0401  HGB 11.4* 11.7* 7.6*   Recent Labs    08/04/20 0429 08/05/20 0401  WBC 14.9* 12.9*  RBC 3.85* 2.47*  HCT 37.1 23.7*  PLT 235 143*   Recent Labs    08/04/20 0429 08/05/20 0401  NA 140 139  K 4.7 4.1  CL 106 107  CO2 23 22  BUN 25* 29*  CREATININE 1.24* 1.33*  GLUCOSE 182* 175*  CALCIUM 9.4 8.0*   Recent Labs    08/03/20 2016  INR 1.0    Assessment/Plan: Pt is POD 1 s/p left hip IM nail.    -Disposition pending medical team clearance and decision  -Hemoglobin at 7.6 this morning following procedure. No symptomatic anemia at time of examination today. Continue to monitor.  -She desires to go home from the hospital rather than go to a skilled nursing facility, discussed she would likely benefit more from a skilled nurse facility given her  nonweightbearing status. She understands this. She does have her husband at home and a lot of nearby social support..    -Nonweightbearing to left lower extremity with touchdown weightbearing for transfers only  -PT eval today  -DVT Prophylaxis: Aspirin and Plavix with TED hose/SCD     Jacqueline Burke 08/05/2020, 10:14 AM

## 2020-08-05 NOTE — Progress Notes (Signed)
Patient came to the floor around midnight in soft restraints due to pulling at lines, pulling at tubes, and removal of equipment. Patient was also agitated, name-calling the nurses, and cussing but in a calm tone. Patient has also refused mobility with the turning every 2 hours. The patient's foley was taken out around 0640 with patient having 500 mL urine output. Patient tolerated the foley removal fairly well.

## 2020-08-06 LAB — CBC
HCT: 28.7 % — ABNORMAL LOW (ref 36.0–46.0)
Hemoglobin: 9.2 g/dL — ABNORMAL LOW (ref 12.0–15.0)
MCH: 30.1 pg (ref 26.0–34.0)
MCHC: 32.1 g/dL (ref 30.0–36.0)
MCV: 93.8 fL (ref 80.0–100.0)
Platelets: 159 10*3/uL (ref 150–400)
RBC: 3.06 MIL/uL — ABNORMAL LOW (ref 3.87–5.11)
RDW: 14.7 % (ref 11.5–15.5)
WBC: 11.6 10*3/uL — ABNORMAL HIGH (ref 4.0–10.5)
nRBC: 0 % (ref 0.0–0.2)

## 2020-08-06 LAB — BASIC METABOLIC PANEL
Anion gap: 7 (ref 5–15)
BUN: 22 mg/dL (ref 8–23)
CO2: 27 mmol/L (ref 22–32)
Calcium: 8.6 mg/dL — ABNORMAL LOW (ref 8.9–10.3)
Chloride: 105 mmol/L (ref 98–111)
Creatinine, Ser: 1.07 mg/dL — ABNORMAL HIGH (ref 0.44–1.00)
GFR, Estimated: 52 mL/min — ABNORMAL LOW (ref >60)
Glucose, Bld: 108 mg/dL — ABNORMAL HIGH (ref 70–99)
Potassium: 4.4 mmol/L (ref 3.5–5.1)
Sodium: 139 mmol/L (ref 135–145)

## 2020-08-06 LAB — BPAM RBC
Blood Product Expiration Date: 202204012359
ISSUE DATE / TIME: 202203061435
Unit Type and Rh: 5100

## 2020-08-06 LAB — TYPE AND SCREEN
ABO/RH(D): O POS
Antibody Screen: NEGATIVE
Unit division: 0

## 2020-08-06 MED ORDER — METOPROLOL TARTRATE 25 MG PO TABS
12.5000 mg | ORAL_TABLET | Freq: Two times a day (BID) | ORAL | Status: DC
  Administered 2020-08-06 – 2020-08-07 (×3): 12.5 mg via ORAL
  Filled 2020-08-06 (×3): qty 1

## 2020-08-06 MED ORDER — FUROSEMIDE 10 MG/ML IJ SOLN
20.0000 mg | Freq: Once | INTRAMUSCULAR | Status: AC
  Administered 2020-08-06: 20 mg via INTRAVENOUS
  Filled 2020-08-06: qty 2

## 2020-08-06 NOTE — Progress Notes (Signed)
  Subjective: Jacqueline Burke is a 85 y.o. female s/p left Hip IM nail.  They are POD2.  Pt's pain is controlled.  Has remained NWB.  She is okay with plan for discharge to SNF.    Objective: Vital signs in last 24 hours: Temp:  [98.2 F (36.8 C)-98.6 F (37 C)] 98.4 F (36.9 C) (03/07 0543) Pulse Rate:  [91-104] 104 (03/07 0543) Resp:  [18-20] 20 (03/07 0543) BP: (96-151)/(50-76) 151/72 (03/07 0543) SpO2:  [90 %-95 %] 93 % (03/07 0543)  Intake/Output from previous day: 03/06 0701 - 03/07 0700 In: 1535.2 [P.O.:385; I.V.:608.2; Blood:342; IV Piggyback:200] Out: 1250 [Urine:1250] Intake/Output this shift: No intake/output data recorded.  Exam:  No gross blood or drainage overlying the dressing 1+ DP pulse Sensation intact distally in the left foot Able to dorsiflex and plantarflex the left foot   Labs: Recent Labs    08/03/20 2016 08/04/20 0429 08/05/20 0401 08/05/20 1159 08/06/20 0438  HGB 11.4* 11.7* 7.6* 7.1* 9.2*   Recent Labs    08/05/20 0401 08/05/20 1159 08/06/20 0438  WBC 12.9*  --  11.6*  RBC 2.47*  --  3.06*  HCT 23.7* 22.0* 28.7*  PLT 143*  --  159   Recent Labs    08/05/20 0401 08/06/20 0438  NA 139 139  K 4.1 4.4  CL 107 105  CO2 22 27  BUN 29* 22  CREATININE 1.33* 1.07*  GLUCOSE 175* 108*  CALCIUM 8.0* 8.6*   Recent Labs    08/03/20 2016  INR 1.0    Assessment/Plan: Pt is POD2 s/p left hip IM nail.    -Disposition pending medical team clearance and decision; plan for eventual discharge to SNF  -NWB to LLE with TDWB for transfers only   -DVT Prophylaxis: Aspirin and plavix  -F/u with Dr. Marlou Sa ~10 days following procedure     Gerrianne Scale Genavive Kubicki 08/06/2020, 9:18 AM

## 2020-08-06 NOTE — TOC Progression Note (Signed)
Transition of Care [facility]) - Progression Note    Patient Details  Name: Jacqueline Burke MRN: [id number] Date of Birth: 1935/12/01  Transition of Care [facility]) CM/SW Contact  Joaquin Courts, RN Phone Number: 08/06/2020, 11:16 AM  Clinical Narrative:    [place] 2 faxed out to area facilities.   Expected Discharge Plan: [place] Barriers to Discharge: Continued Medical Work up  Expected Discharge Plan and Services Expected Discharge Plan: [place]   Discharge Planning Services: CM Consult Post Acute Care Choice: [place]                                         Social Determinants of Health (SDOH) Interventions    Readmission Risk Interventions No flowsheet data found.

## 2020-08-06 NOTE — [place] FL2 (Signed)
MEDICAID FL2 LEVEL OF CARE SCREENING TOOL     IDENTIFICATION  Patient Name: GENA LASKI Birthdate: Jun 26, 1935 Sex: female Admission Date (Current Location): 08/03/2020  [facility] and [place] Number:  Herbalist and Address:  [place],  [place], [place]      Provider Number: [id number]  Attending Physician Name and Address:  Bonnielee Haff, MD  Relative Name and Phone Number:       Current Level of Care: Hospital Recommended Level of Care: [place] Prior Approval Number:    Date Approved/Denied:   PASRR Number: [id number]  Discharge Plan: SNF    Current Diagnoses: Patient Active Problem List   Diagnosis Date Noted   Essential hypertension 08/03/2020   CKD (chronic kidney disease), stage III ([place]) 08/03/2020   Closed intertrochanteric fracture of hip, left, initial encounter ([place]) 08/03/2020   Pulmonary fibrosis ([place]) 08/03/2020   Cough 03/20/2020   CAD (coronary artery disease), native coronary artery 08/25/2017   Hyperlipidemia LDL goal <70 08/25/2017   NSTEMI (non-ST elevated myocardial infarction) ([place]) 06/12/2017   Chronic chest pain    Ureteral calculus 06/09/2017   Edema extremities 02/16/2014   Sinusitis, chronic 11/11/2013   Dyspnea 11/11/2013   PVC's (premature ventricular contractions) 08/19/2013   Benign hypertensive heart disease without heart failure 08/15/2013    Orientation RESPIRATION BLADDER Height & Weight     Self,Time,Situation,Place  Normal Continent Weight: 75.4 kg Height:  5\' 1"  (154.9 cm)  BEHAVIORAL SYMPTOMS/MOOD NEUROLOGICAL BOWEL NUTRITION STATUS      Continent Diet  AMBULATORY STATUS COMMUNICATION OF NEEDS Skin   Extensive Assist Verbally Normal                       Personal Care Assistance Level of Assistance  Bathing,Dressing,Total care Bathing Assistance: Maximum assistance   Dressing Assistance: Maximum assistance Total Care  Assistance: Maximum assistance   Functional Limitations Info             SPECIAL CARE FACTORS FREQUENCY  PT (By licensed PT),OT (By licensed OT)     PT Frequency: 5x weekly OT Frequency: 5x weekly            Contractures Contractures Info: Not present    Additional Factors Info  Code Status,Allergies Code Status Info: Full Allergies Info: ciprofloxacin, demerol, codeine, hydrogen peroxide, propoxyphene, tramadol           Current Medications (08/06/2020):  This is the current hospital active medication list Current Facility-Administered Medications  Medication Dose Route Frequency Provider Last Rate Last Admin   acetaminophen (TYLENOL) tablet 650 mg  650 mg Oral Q6H PRN Bonnielee Haff, MD       Or   acetaminophen (TYLENOL) suppository 650 mg  650 mg Rectal Q6H PRN Bonnielee Haff, MD       albuterol (VENTOLIN HFA) 108 (90 Base) MCG/ACT inhaler 1 puff  1 puff Inhalation Daily PRN Orma Flaming, MD       amitriptyline (ELAVIL) tablet 20 mg  20 mg Oral QHS Orma Flaming, MD   20 mg at 08/05/20 2200   aspirin chewable tablet 81 mg  81 mg Oral Daily Magnant, Charles L, PA-C   81 mg at 08/06/20 0926   atorvastatin (LIPITOR) tablet 80 mg  80 mg Oral q1800 Orma Flaming, MD   80 mg at 08/05/20 1700   Chlorhexidine Gluconate Cloth 2 % PADS 6 each  6 each Topical Daily Bonnielee Haff, MD  clopidogrel (PLAVIX) tablet 75 mg  75 mg Oral Daily Bonnielee Haff, MD   75 mg at 08/06/20 0174   docusate sodium (COLACE) capsule 200 mg  200 mg Oral Daily Orma Flaming, MD   200 mg at 08/06/20 0926   guaiFENesin (MUCINEX) 12 hr tablet 600 mg  600 mg Oral BID Bonnielee Haff, MD   600 mg at 08/06/20 0926   guaiFENesin (ROBITUSSIN) 100 MG/5ML solution 100 mg  5 mL Oral Q4H PRN Bonnielee Haff, MD       haloperidol lactate (HALDOL) injection 0.5 mg  0.5 mg Intravenous Q6H PRN Bonnielee Haff, MD       HYDROcodone-acetaminophen (NORCO) 7.5-325 MG per tablet 1-2 tablet  1-2  tablet Oral Q4H PRN Bonnielee Haff, MD       menthol-cetylpyridinium (CEPACOL) lozenge 3 mg  1 lozenge Oral PRN Magnant, Charles L, PA-C       Or   phenol (CHLORASEPTIC) mouth spray 1 spray  1 spray Mouth/Throat PRN Magnant, Charles L, PA-C       metoCLOPramide (REGLAN) tablet 5-10 mg  5-10 mg Oral Q8H PRN Magnant, Charles L, PA-C       Or   metoCLOPramide (REGLAN) injection 5-10 mg  5-10 mg Intravenous Q8H PRN Magnant, Charles L, PA-C       metoprolol tartrate (LOPRESSOR) tablet 12.5 mg  12.5 mg Oral BID Bonnielee Haff, MD       morphine 2 MG/ML injection 0.5 mg  0.5 mg Intravenous Q3H PRN Magnant, Charles L, PA-C       naloxone [facility]) injection 0.4 mg  0.4 mg Intravenous PRN [name], MD   0.2 mg at 08/04/20 2226   ondansetron (ZOFRAN) tablet 4 mg  4 mg Oral Q6H PRN Orma Flaming, MD       Or   ondansetron [facility]) injection 4 mg  4 mg Intravenous Q6H PRN Orma Flaming, MD   4 mg at 08/04/20 0200   polyethylene glycol (MIRALAX / GLYCOLAX) packet 17 g  17 g Oral Daily PRN Bonnielee Haff, MD   17 g at 08/06/20 9449   senna-docusate (Senokot-S) tablet 2 tablet  2 tablet Oral QHS Bonnielee Haff, MD   2 tablet at 08/05/20 2200   umeclidinium bromide (INCRUSE ELLIPTA) 62.5 MCG/INH 1 puff  1 puff Inhalation Daily Orma Flaming, MD   1 puff at 08/04/20 0820     Discharge Medications: Please see discharge summary for a list of discharge medications.  Relevant Imaging Results:  Relevant Lab Results:   Additional Information SSN [id number]  Joaquin Courts, RN

## 2020-08-06 NOTE — Progress Notes (Signed)
Notified provider on call about patient having 582 mL in bladder from bladder scan. Patient has had at least 750 mL of urinary output throughout the shift. Patient mentioned to nurse that sometimes patient's bladder will get full before it empties, which could explain the reason for the amount in the bladder. Provider on call mentioned an in and out cath, but decided to hold of on either an in and out cath or foley catheter due to the adequate amount of urine output the patient has had throughout the night. Patient also did not complain of any discomfort or pressure.

## 2020-08-06 NOTE — Progress Notes (Signed)
Physical Therapy Treatment Patient Details Name: CAIDYNCE MUZYKA MRN: [id number] DOB: April 12, 1936 Today's Date: 08/06/2020    History of Present Illness Pt s/p fall with L hip fx and now s/p IM nailing with cables.  Pt with hx of CAD and bil TKR    PT Comments    POD # 2 General Comments: AxO x 2 improving but requiring repeat instruction on her NWB Had to use Maxi Move Lift to assist pt OOB.  General bed mobility comments: + 2 assist for side to side rolling to place MAXI Move pad under pt.  Extra support L LE due to pain and 25% VC's on tech use of bed rails to increase self assist level. General transfer comment: useed Maxi Move to assist pt OOB to recliner.  Attempted sit to stand from recliner with 75% VC's on proper hand placeemnt however pt unable to "push off" enough to clear hips and had difficulty adhering to NWB.  Pt <25% able to stand.  Rec MAXI Move back to bed.   Follow Up Recommendations  SNF     Equipment Recommendations       Recommendations for Other Services       Precautions / Restrictions Precautions Precautions: Fall Restrictions Weight Bearing Restrictions: Yes LLE Weight Bearing: Non weight bearing Other Position/Activity Restrictions: TDWB for transfers only    Mobility  Bed Mobility Overal bed mobility: Needs Assistance Bed Mobility: Rolling Rolling: +2 for physical assistance;+2 for safety/equipment;Max assist         General bed mobility comments: + 2 assist for side to side rolling to place MAXI Move pad under pt.  Extra support L LE due to pain and 25% VC's on tech use of bed rails to increase self assist level.    Transfers                 General transfer comment: useed Maxi Move to assist pt OOB to recliner.  Attempted sit to stand from recliner with 75% VC's on proper hand placeemnt however pt unable to "push off" enough to clear hips and had difficulty adhering to NWB.  Pt <25% able to stand.  Rec MAXI Move back to  bed.  Ambulation/Gait             General Gait Details: non amb at this time.   Stairs             Wheelchair Mobility    Modified Rankin (Stroke Patients Only)       Balance                                            Cognition Arousal/Alertness: Awake/alert Behavior During Therapy: WFL for tasks assessed/performed Overall Cognitive Status: Within Functional Limits for tasks assessed                                 General Comments: AxO x 2 improving but requiring repeat instruction on her NWB      Exercises      General Comments        Pertinent Vitals/Pain Pain Assessment: Faces Faces Pain Scale: Hurts even more Pain Location: L LE with mobility Pain Descriptors / Indicators: Aching;Sore;Grimacing;Operative site guarding Pain Intervention(s): Monitored during session;Premedicated before session;Repositioned;Ice applied    Home Living  Prior Function            PT Goals (current goals can now be found in the care plan section) Acute Rehab PT Goals Time For Goal Achievement: 08/19/20 Progress towards PT goals: Progressing toward goals    Frequency    Min 3X/week      PT Plan Current plan remains appropriate    Co-evaluation              AM-PAC PT "6 Clicks" Mobility   Outcome Measure  Help needed turning from your back to your side while in a flat bed without using bedrails?: A Lot Help needed moving from lying on your back to sitting on the side of a flat bed without using bedrails?: A Lot Help needed moving to and from a bed to a chair (including a wheelchair)?: A Lot Help needed standing up from a chair using your arms (e.g., wheelchair or bedside chair)?: Total Help needed to walk in hospital room?: Total Help needed climbing 3-5 steps with a railing? : Total 6 Click Score: 9    End of Session Equipment Utilized During Treatment: Oxygen;Gait belt Activity  Tolerance: Patient limited by fatigue;No increased pain Patient left: in chair;with call bell/phone within reach;with chair alarm set Nurse Communication: Need for lift equipment;Mobility status PT Visit Diagnosis: Difficulty in walking, not elsewhere classified (R26.2);Pain;History of falling (Z91.81) Pain - Right/Left: Left Pain - part of body: Leg     Time: 0240-9735 PT Time Calculation (min) (ACUTE ONLY): 24 min  Charges:  $Therapeutic Exercise: 8-22 mins $Therapeutic Activity: 8-22 mins                     Rica Koyanagi  PTA Acute  Rehabilitation Services Pager      [phone] Office      [phone]

## 2020-08-06 NOTE — Plan of Care (Signed)
  Problem: Education: Goal: Knowledge of General Education information will improve Description: Including pain rating scale, medication(s)/side effects and non-pharmacologic comfort measures Outcome: Progressing   Problem: Coping: Goal: Level of anxiety will decrease Outcome: Progressing   Problem: Elimination: Goal: Will not experience complications related to urinary retention Outcome: Progressing   Problem: Pain Managment: Goal: General experience of comfort will improve Outcome: Progressing   Problem: Safety: Goal: Ability to remain free from injury will improve Outcome: Progressing   Problem: Skin Integrity: Goal: Risk for impaired skin integrity will decrease Outcome: Progressing   Problem: Education: Goal: Knowledge of the prescribed therapeutic regimen will improve Outcome: Progressing   Problem: Clinical Measurements: Goal: Postoperative complications will be avoided or minimized Outcome: Progressing   Problem: Pain Management: Goal: Pain level will decrease with appropriate interventions Outcome: Progressing   Problem: Skin Integrity: Goal: Will show signs of wound healing Outcome: Progressing   Problem: Safety: Goal: Non-violent Restraint(s) Outcome: Completed/Met

## 2020-08-06 NOTE — Progress Notes (Signed)
[facility] PROGRESS NOTE   Jacqueline Burke JJH:[id number] DOB: 03/01/1936 DOA: 08/03/2020  PCP: Glenis Smoker, MD  Brief History/Interval Summary: 85 y.o. female with medical history significant of HTN, PVCs, ASCAD s/p NSTEMI in setting of acute respiratory failure and e.coli bacteremia in January of 2019, pulmonary fibrosis, CKD stage 3 and allergies who presented to ER via EMS after fall.   She sustained a fracture involving left hip.  Patient was hospitalized for further management.   Consultants: Orthopedics  Procedures: Intra medullary nail left hip on 3/5  Antibiotics: Anti-infectives (From admission, onward)   Start     Dose/Rate Route Frequency Ordered Stop   08/05/20 0200  ceFAZolin (ANCEF) IVPB 2g/100 mL premix        2 g 200 mL/hr over 30 Minutes Intravenous Every 8 hours 08/04/20 2234 08/05/20 0949   08/04/20 1958  vancomycin (VANCOCIN) powder  Status:  Discontinued          As needed 08/04/20 1958 08/04/20 2354   08/04/20 0600  ceFAZolin (ANCEF) IVPB 2g/100 mL premix        2 g 200 mL/hr over 30 Minutes Intravenous On call to O.R. 08/04/20 0330 08/04/20 1752      Subjective/Interval History: Patient's mentation has improved over the last 24 hours.  She was apologetic for her behavior yesterday.  She complains of 5 out of 10 pain in the left leg.  Still seems a bit distracted at times.     Assessment/Plan:  Intertrochanteric fracture of the left hip Orthopedics was consulted and the patient underwent surgery on 3/5.Marland Kitchen  Patient seen by PT and OT.  SNF is recommended which the patient is agreeable to.  Pain seems to be adequately controlled.  Continue pain medications and bowel regimen.  Leukocytosis is likely reactive.  Agitation Patient had episodes of agitation overnight on 3/5.  Over the last 24 hours she appears to have improved.  Not as paranoid.  Haldol is available as needed.  Continue to reorient and mobilize.  No focal deficits noted.    Acute  urinary retention Had Foley catheter on the first day of admission but it was discontinued on 3/6.  Noted to have some urinary retention this morning.  Continue with bladder scans.  May need in and out catheterization if she is not able to completely evacuate her bladder.  Consider Flomax.  Normocytic anemia/acute blood loss anemia Drop in hemoglobin likely due to combination of dilution as well as operative losses.  No obvious bruising noted over the lower extremity although the operative site is swollen.  Went 1 unit of blood transfusion yesterday with improvement in hemoglobin.  Continue to check daily for now.    History of coronary artery disease Status post MI in 2019 with cardiac cath revealing 95% RCA stenosis which was followed by PCI with a drug-eluting stent.  Patient has been on aspirin and Plavix.   Antiplatelet agents were held for surgery.  They have been resumed now.  Patient remained stable from a cardiac standpoint.  Continue statin.  Seems to be slightly fluid overloaded this morning.  We will give her 1 dose of Lasix.   Hyperlipidemia Continue statin.  Essential hypertension Blood pressures were borderline low yesterday so amlodipine and metoprolol was discontinued.  Depending on blood pressure trends today we will resume her metoprolol to begin with.  Acute on chronic kidney disease stage IIIa Yesterday with elevated BUN.  Likely due to poor oral intake.  She was gently  hydrated and was given blood transfusion.  Renal function has improved.  Now with slight fluid overload.  We will give her a small dose of Lasix.  Monitor urine output.  Interstitial lung disease as a sequelae of COVID-19/acute respiratory failure with hypoxia Her hypoxia most likely is due to narcotics.  Apparently her pulmonary fibrosis was diagnosed recently and she has been following with Dr. Lamonte Sakai.  Last seen by him in October 2021..   Does not use oxygen at home.  Currently saturating in the 90s on 1 L.   Continue to monitor.  Chest x-ray showed chronic findings.  Obesity Estimated body mass index is 31.41 kg/m as calculated from the following:   Height as of this encounter: 5\' 1"  (1.549 m).   Weight as of this encounter: 75.4 kg.   DVT Prophylaxis: SCDs for now Code Status: Full code Family Communication: Husband was updated yesterday Disposition Plan: SNF  Status is: Inpatient  Remains inpatient appropriate because:Ongoing active pain requiring inpatient pain management and Inpatient level of care appropriate due to severity of illness   Dispo: The patient is from: Home              Anticipated d/c is to: SNF              Patient currently is not medically stable to d/c.   Difficult to place patient No     Medications:  Scheduled: . amitriptyline  20 mg Oral QHS  . aspirin  81 mg Oral Daily  . atorvastatin  80 mg Oral q1800  . Chlorhexidine Gluconate Cloth  6 each Topical Daily  . clopidogrel  75 mg Oral Daily  . docusate sodium  200 mg Oral Daily  . furosemide  20 mg Intravenous Once  . guaiFENesin  600 mg Oral BID  . senna-docusate  2 tablet Oral QHS  . umeclidinium bromide  1 puff Inhalation Daily   Continuous:  NLZ:[id number] **OR** acetaminophen, albuterol, guaiFENesin, haloperidol lactate, HYDROcodone-acetaminophen, menthol-cetylpyridinium **OR** phenol, metoCLOPramide **OR** metoCLOPramide (REGLAN) injection, morphine injection, naloxone, ondansetron **OR** ondansetron (ZOFRAN) IV, polyethylene glycol   Objective:  Vital Signs  Vitals:   08/05/20 1454 08/05/20 1748 08/05/20 2208 08/06/20 0543  BP: 119/62 138/69 139/76 (!) 151/72  Pulse: 95 93 (!) 101 (!) 104  Resp: 19 19 18 20   Temp: 98.4 F (36.9 C) 98.5 F (36.9 C) 98.5 F (36.9 C) 98.4 F (36.9 C)  TempSrc: Oral Oral  Oral  SpO2: 95% 90% 94% 93%  Weight:      Height:        Intake/Output Summary (Last 24 hours) at 08/06/2020 0912 Last data filed at 08/06/2020 0200 Gross per 24 hour   Intake 1535.23 ml  Output 1250 ml  Net 285.23 ml   Filed Weights   08/04/20 0226 08/05/20 0000  Weight: 72.4 kg 75.4 kg    General appearance: Awake alert.  In no distress.  Mildly distracted Resp: Mild tachypnea is noted without use of accessory muscles.  Few crackles bilateral bases.  No wheezing or rhonchi  Cardio: S1-S2 is normal regular.  No S3-S4.  No rubs murmurs or bruit GI: Abdomen is soft.  Nontender nondistended.  Bowel sounds are present normal.  No masses organomegaly Extremities: No edema.  Dressing noted over the left lower extremity. Neurologic: Alert and oriented x3.  No focal neurological deficits.  Mentation has improved.     Lab Results:  Data Reviewed: I have personally reviewed following labs and imaging studies  CBC:  Recent Labs  Lab 08/03/20 2016 08/04/20 0429 08/05/20 0401 08/05/20 1159 08/06/20 0438  WBC 13.9* 14.9* 12.9*  --  11.6*  NEUTROABS 9.8*  --   --   --   --   HGB 11.4* 11.7* 7.6* 7.1* 9.2*  HCT 35.2* 37.1 23.7* 22.0* 28.7*  MCV 94.6 96.4 96.0  --  93.8  PLT 231 235 143*  --  034    Basic Metabolic Panel: Recent Labs  Lab 08/03/20 2016 08/04/20 0429 08/05/20 0401 08/06/20 0438  NA 140 140 139 139  K 4.0 4.7 4.1 4.4  CL 107 106 107 105  CO2 24 23 22 27   GLUCOSE 141* 182* 175* 108*  BUN 24* 25* 29* 22  CREATININE 1.02* 1.24* 1.33* 1.07*  CALCIUM 9.3 9.4 8.0* 8.6*    GFR: Estimated Creatinine Clearance: 37.6 mL/min (A) (by C-G formula based on SCr of 1.07 mg/dL (H)).   Coagulation Profile: Recent Labs  Lab 08/03/20 2016  INR 1.0    HbA1C: Recent Labs    08/04/20 0429  HGBA1C 6.1*     Recent Results (from the past 240 hour(s))  Resp Panel by RT-PCR (Flu A&B, Covid) Nasopharyngeal Swab     Status: None   Collection Time: 08/03/20  9:39 PM   Specimen: Nasopharyngeal Swab; Nasopharyngeal(NP) swabs in vial transport medium  Result Value Ref Range Status   SARS Coronavirus 2 by RT PCR NEGATIVE NEGATIVE Final     Comment: (NOTE) SARS-CoV-2 target nucleic acids are NOT DETECTED.  The SARS-CoV-2 RNA is generally detectable in upper respiratory specimens during the acute phase of infection. The lowest concentration of SARS-CoV-2 viral copies this assay can detect is 138 copies/mL. A negative result does not preclude SARS-Cov-2 infection and should not be used as the sole basis for treatment or other patient management decisions. A negative result may occur with  improper specimen collection/handling, submission of specimen other than nasopharyngeal swab, presence of viral mutation(s) within the areas targeted by this assay, and inadequate number of viral copies(<138 copies/mL). A negative result must be combined with clinical observations, patient history, and epidemiological information. The expected result is Negative.  Fact Sheet for Patients:  EntrepreneurPulse.com.au  Fact Sheet for Healthcare Providers:  IncredibleEmployment.be  This test is no t yet approved or cleared by the [place] FDA and  has been authorized for detection and/or diagnosis of SARS-CoV-2 by FDA under an Emergency Use Authorization (EUA). This EUA will remain  in effect (meaning this test can be used) for the duration of the COVID-19 declaration under Section 564(b)(1) of the Act, 21 U.S.C.section 360bbb-3(b)(1), unless the authorization is terminated  or revoked sooner.       Influenza A by PCR NEGATIVE NEGATIVE Final   Influenza B by PCR NEGATIVE NEGATIVE Final    Comment: (NOTE) The Xpert Xpress SARS-CoV-2/FLU/RSV plus assay is intended as an aid in the diagnosis of influenza from Nasopharyngeal swab specimens and should not be used as a sole basis for treatment. Nasal washings and aspirates are unacceptable for Xpert Xpress SARS-CoV-2/FLU/RSV testing.  Fact Sheet for Patients: EntrepreneurPulse.com.au  Fact Sheet for Healthcare  Providers: IncredibleEmployment.be  This test is not yet approved or cleared by the [place] FDA and has been authorized for detection and/or diagnosis of SARS-CoV-2 by FDA under an Emergency Use Authorization (EUA). This EUA will remain in effect (meaning this test can be used) for the duration of the COVID-19 declaration under Section 564(b)(1) of the Act, 21 U.S.C. section 360bbb-3(b)(1), unless  the authorization is terminated or revoked.  Performed at [facility], [place] [address] [place], [place] [address]   Surgical PCR screen     Status: None   Collection Time: 08/04/20  2:51 AM   Specimen: Nasal Mucosa; Nasal Swab  Result Value Ref Range Status   MRSA, PCR NEGATIVE NEGATIVE Final   Staphylococcus aureus NEGATIVE NEGATIVE Final    Comment: (NOTE) The Xpert SA Assay (FDA approved for NASAL specimens in patients 83 years of age and older), is one component of a comprehensive surveillance program. It is not intended to diagnose infection nor to guide or monitor treatment. Performed at [facility], [place] [address] [place], [place] [address]       Radiology Studies: Pelvis Portable  Result Date: 08/04/2020 CLINICAL DATA:  Postop left hip fracture fixation. EXAM: PORTABLE PELVIS 1-2 VIEWS COMPARISON:  Preoperative radiograph yesterday. FINDINGS: Intramedullary nail with trans trochanteric screw fixation of proximal femur fracture. Fracture is in improved alignment compared to preoperative imaging. The distal aspect of the femoral nail is not included in the field of view. Recent postsurgical change includes air and edema in the soft tissues. IMPRESSION: ORIF of proximal femur fracture without immediate postoperative complication. Distal aspect of the femoral nail is not included in the field of view. Electronically Signed   By: Keith Rake M.D.   On: 08/04/2020 23:11   DG C-Arm 1-60 Min-No Report  Result Date:  08/04/2020 Fluoroscopy was utilized by the requesting physician.  No radiographic interpretation.   DG HIP OPERATIVE UNILAT W OR W/O PELVIS LEFT  Result Date: 08/04/2020 CLINICAL DATA:  Left femoral IM nail. EXAM: OPERATIVE LEFT HIP (WITH PELVIS IF PERFORMED) TECHNIQUE: Fluoroscopic spot image(s) were submitted for interpretation post-operatively. COMPARISON:  Preoperative hip radiograph yesterday. FINDINGS: Four fluoroscopic spot views obtained in the operating room in frontal and lateral projections. Intramedullary nail with trans trochanteric and distal locking screws traverses intertrochanteric femur fracture. Improved alignment from preoperative imaging. Knee arthroplasty is partially included. Fluoroscopy time 1 minutes 14 seconds. Dose 19.1 mGy. IMPRESSION: Fluoroscopic spot views after ORIF left proximal femur fracture. Electronically Signed   By: Keith Rake M.D.   On: 08/04/2020 20:31       LOS: 3 days   [place] [facility] Pager on [email or web address]  08/06/2020, 9:12 AM

## 2020-08-06 NOTE — Care Management Important Message (Signed)
Important Message  Patient Details IM Letter given to the Patient. Name: Jacqueline Burke MRN: [id number] Date of Birth: 1936/01/26   Medicare Important Message Given:  Yes     Kerin Salen 08/06/2020, 2:23 PM

## 2020-08-06 NOTE — Op Note (Signed)
NAMEJAYLI, FOGLEMAN MEDICAL RECORD NO: [id number] ACCOUNT NO: [id number] DATE OF BIRTH: 09/22/1935 FAC[facility]: [facility]ATION: [id number] PHYSICIAN: Yetta Barre. Marlou Sa, MD  Operative Report   DATE OF PROCEDURE: 08/04/2020  PREOPERATIVE DIAGNOSIS:  Left hip intertrochanteric/subtrochanteric femur fracture.  POSTOPERATIVE DIAGNOSIS:  Left hip intertrochanteric/subtrochanteric femur fracture.  PROCEDURE:  Intramedullary hip screw with cable fixation using Arthrex 10 x 36 mm IMHS and 3 Arthrex cerclage suture cables.  SURGEON:  Meredith Pel, MD  ASSISTANT:  Annie Main, PA  INDICATIONS:  The patient is an 85 year old patient with left hip pain who presents for operative management after explanation of risks and benefits.  DESCRIPTION OF PROCEDURE:  The patient was brought to the operating room where general endotracheal anesthesia was induced.  Preoperative antibiotics were administered.  Timeout was called.  The patient was placed with the right leg in lithotomy position  with the peroneal nerve well padded.  Left leg was then placed under traction.  Under fluoroscopic guidance, good reduction was achieved.  Left hip was prescrubbed with alcohol and Betadine, allowed to air dry.  Prepped with DuraPrep solution and draped  in a sterile manner.  Charlie Pitter was used to cover the operative field with a wall drape.  A timeout was called.  The fracture extended down below the lesser trochanter.  This was a reverse obliquity type fracture.  The incision was made at the level of the  lesser trochanter slightly above and below.  Skin and subcutaneous tissue was sharply divided.  The tensor fascia lata was divided and the muscle was then bluntly divided with a Cobb elevator.  Next, carefully the Dall-Miles suture passer was placed  around the fracture site, both proximal and distal to the lesser trochanter. Two Arthrex suture cables were placed to secure the unstable portion of the fracture prior to placing  the guide pin and nail.  This gave very secure fixation.  At this time, the  leg was then taken into adduction.  Guide pin was placed.  Proximal reaming was performed up to 11.5 mm.  Nail length 36 mm.  Nail was then placed with good maintenance of fracture reduction.  The leg was then taken out of adduction.  Compression screw  placed about 3-4 mm from the articular surface because this was going to be a fixed construct.  No compression necessary.  Traction was removed from the construct prior to placing the distal interlocks.  Good placement of the screw was made in the AP and  lateral planes under fluoroscopy.  Next, 2 distal interlocking screws were placed with reasonable purchase obtained.  Rotational alignment was correct with the patella and foot pointing superiorly.  Traction was released prior to placing the  interlocking screws.  All in all, final construct appeared good.  One final cable was placed just near the lateral cortical extension of the subtrochanteric femur fracture.  This gave very nice fixation in that region to allow for early weightbearing.  A  thorough irrigation was performed in all the incisions. Distal interlocking incisions were closed using 2-0 Vicryl, 3-0 nylon.  The middle incision was closed using #1 Vicryl suture to reapproximate the fascia lata, followed by interrupted inverted 0  Vicryl suture, 2-0 Vicryl suture, and 3-0 nylon.  It should be noted that vancomycin powder was placed in the middle and proximal incision after thorough irrigation.  Proximal incision was closed using #1 Vicryl suture, 0 Vicryl suture and 2-0 Vicryl and  3-0 nylon.  Aquacel dressings placed.  The patient tolerated the procedure well without immediate complication and transferred to the recovery room in stable condition.  Luke's assistance was required for limb positioning, mobilization of tissue,  passage of wires.  His assistance was a medical necessity.   SHW D: 08/04/2020 8:59:02 pm T:  08/05/2020 1:37:00 am  JOB: [id number]/ [id number]

## 2020-08-07 DIAGNOSIS — I358 Other nonrheumatic aortic valve disorders: Secondary | ICD-10-CM | POA: Diagnosis not present

## 2020-08-07 DIAGNOSIS — D649 Anemia, unspecified: Secondary | ICD-10-CM | POA: Diagnosis not present

## 2020-08-07 DIAGNOSIS — I129 Hypertensive chronic kidney disease with stage 1 through stage 4 chronic kidney disease, or unspecified chronic kidney disease: Secondary | ICD-10-CM | POA: Diagnosis not present

## 2020-08-07 DIAGNOSIS — Z9181 History of falling: Secondary | ICD-10-CM | POA: Diagnosis not present

## 2020-08-07 DIAGNOSIS — K219 Gastro-esophageal reflux disease without esophagitis: Secondary | ICD-10-CM | POA: Diagnosis not present

## 2020-08-07 DIAGNOSIS — J9601 Acute respiratory failure with hypoxia: Secondary | ICD-10-CM | POA: Diagnosis not present

## 2020-08-07 DIAGNOSIS — S72142D Displaced intertrochanteric fracture of left femur, subsequent encounter for closed fracture with routine healing: Secondary | ICD-10-CM | POA: Diagnosis not present

## 2020-08-07 DIAGNOSIS — I10 Essential (primary) hypertension: Secondary | ICD-10-CM | POA: Diagnosis not present

## 2020-08-07 DIAGNOSIS — R41841 Cognitive communication deficit: Secondary | ICD-10-CM | POA: Diagnosis not present

## 2020-08-07 DIAGNOSIS — R0902 Hypoxemia: Secondary | ICD-10-CM | POA: Diagnosis not present

## 2020-08-07 DIAGNOSIS — D62 Acute posthemorrhagic anemia: Secondary | ICD-10-CM | POA: Diagnosis not present

## 2020-08-07 DIAGNOSIS — G629 Polyneuropathy, unspecified: Secondary | ICD-10-CM | POA: Diagnosis not present

## 2020-08-07 DIAGNOSIS — R451 Restlessness and agitation: Secondary | ICD-10-CM | POA: Diagnosis not present

## 2020-08-07 DIAGNOSIS — Z7401 Bed confinement status: Secondary | ICD-10-CM | POA: Diagnosis not present

## 2020-08-07 DIAGNOSIS — N179 Acute kidney failure, unspecified: Secondary | ICD-10-CM | POA: Diagnosis not present

## 2020-08-07 DIAGNOSIS — M6281 Muscle weakness (generalized): Secondary | ICD-10-CM | POA: Diagnosis not present

## 2020-08-07 DIAGNOSIS — N390 Urinary tract infection, site not specified: Secondary | ICD-10-CM | POA: Diagnosis not present

## 2020-08-07 DIAGNOSIS — K521 Toxic gastroenteritis and colitis: Secondary | ICD-10-CM | POA: Diagnosis not present

## 2020-08-07 DIAGNOSIS — J8410 Pulmonary fibrosis, unspecified: Secondary | ICD-10-CM | POA: Diagnosis not present

## 2020-08-07 DIAGNOSIS — J849 Interstitial pulmonary disease, unspecified: Secondary | ICD-10-CM | POA: Diagnosis not present

## 2020-08-07 DIAGNOSIS — M2550 Pain in unspecified joint: Secondary | ICD-10-CM | POA: Diagnosis not present

## 2020-08-07 DIAGNOSIS — N3281 Overactive bladder: Secondary | ICD-10-CM | POA: Diagnosis not present

## 2020-08-07 DIAGNOSIS — S72142S Displaced intertrochanteric fracture of left femur, sequela: Secondary | ICD-10-CM | POA: Diagnosis not present

## 2020-08-07 DIAGNOSIS — R339 Retention of urine, unspecified: Secondary | ICD-10-CM | POA: Diagnosis not present

## 2020-08-07 DIAGNOSIS — S72142A Displaced intertrochanteric fracture of left femur, initial encounter for closed fracture: Secondary | ICD-10-CM | POA: Diagnosis not present

## 2020-08-07 DIAGNOSIS — I2510 Atherosclerotic heart disease of native coronary artery without angina pectoris: Secondary | ICD-10-CM | POA: Diagnosis not present

## 2020-08-07 DIAGNOSIS — E669 Obesity, unspecified: Secondary | ICD-10-CM | POA: Diagnosis not present

## 2020-08-07 DIAGNOSIS — N1831 Chronic kidney disease, stage 3a: Secondary | ICD-10-CM | POA: Diagnosis not present

## 2020-08-07 DIAGNOSIS — G8918 Other acute postprocedural pain: Secondary | ICD-10-CM | POA: Diagnosis not present

## 2020-08-07 DIAGNOSIS — E785 Hyperlipidemia, unspecified: Secondary | ICD-10-CM | POA: Diagnosis not present

## 2020-08-07 DIAGNOSIS — B9620 Unspecified Escherichia coli [E. coli] as the cause of diseases classified elsewhere: Secondary | ICD-10-CM | POA: Diagnosis not present

## 2020-08-07 LAB — CBC
HCT: 27 % — ABNORMAL LOW (ref 36.0–46.0)
Hemoglobin: 9 g/dL — ABNORMAL LOW (ref 12.0–15.0)
MCH: 31 pg (ref 26.0–34.0)
MCHC: 33.3 g/dL (ref 30.0–36.0)
MCV: 93.1 fL (ref 80.0–100.0)
Platelets: 170 10*3/uL (ref 150–400)
RBC: 2.9 MIL/uL — ABNORMAL LOW (ref 3.87–5.11)
RDW: 14.5 % (ref 11.5–15.5)
WBC: 9.9 10*3/uL (ref 4.0–10.5)
nRBC: 0 % (ref 0.0–0.2)

## 2020-08-07 LAB — BASIC METABOLIC PANEL
Anion gap: 8 (ref 5–15)
BUN: 21 mg/dL (ref 8–23)
CO2: 26 mmol/L (ref 22–32)
Calcium: 8.4 mg/dL — ABNORMAL LOW (ref 8.9–10.3)
Chloride: 104 mmol/L (ref 98–111)
Creatinine, Ser: 0.93 mg/dL (ref 0.44–1.00)
GFR, Estimated: >60 mL/min (ref >60)
Glucose, Bld: 103 mg/dL — ABNORMAL HIGH (ref 70–99)
Potassium: 3.8 mmol/L (ref 3.5–5.1)
Sodium: 138 mmol/L (ref 135–145)

## 2020-08-07 LAB — RESP PANEL BY RT-PCR (FLU A&B, COVID) ARPGX2
Influenza A by PCR: NEGATIVE
Influenza B by PCR: NEGATIVE
SARS Coronavirus 2 by RT PCR: NEGATIVE

## 2020-08-07 MED ORDER — SENNOSIDES-DOCUSATE SODIUM 8.6-50 MG PO TABS: 2.0000 | ORAL_TABLET | Freq: Every day | ORAL | Status: DC

## 2020-08-07 MED ORDER — GUAIFENESIN ER 600 MG PO TB12: 600.0000 mg | ORAL_TABLET | Freq: Two times a day (BID) | ORAL | Status: DC

## 2020-08-07 MED ORDER — POLYETHYLENE GLYCOL 3350 17 G PO PACK
17.0000 g | PACK | Freq: Every day | ORAL | Status: DC
  Administered 2020-08-07: 17 g via ORAL
  Filled 2020-08-07: qty 1

## 2020-08-07 MED ORDER — HYDROCODONE-ACETAMINOPHEN 5-325 MG PO TABS: 1.0000 | ORAL_TABLET | Freq: Four times a day (QID) | ORAL | 0 refills | Status: DC | PRN

## 2020-08-07 MED ORDER — SPIRIVA HANDIHALER 18 MCG IN CAPS: 18.0000 ug | ORAL_CAPSULE | Freq: Every day | RESPIRATORY_TRACT | 12 refills | Status: DC

## 2020-08-07 MED ORDER — POLYETHYLENE GLYCOL 3350 17 G PO PACK: 17.0000 g | PACK | Freq: Every day | ORAL | 0 refills | Status: DC

## 2020-08-07 MED ORDER — POLYETHYLENE GLYCOL 3350 17 G PO PACK: 17.0000 g | PACK | Freq: Every day | ORAL | 0 refills | Status: DC | PRN

## 2020-08-07 MED ORDER — AMLODIPINE BESYLATE 2.5 MG PO TABS: 2.5000 mg | ORAL_TABLET | Freq: Every day | ORAL | Status: DC

## 2020-08-07 NOTE — TOC Progression Note (Signed)
Transition of Care [facility]) - Progression Note    Patient Details  Name: Jacqueline Burke MRN: [id number] Date of Birth: 1935/06/04  Transition of Care [facility]) CM/SW Contact  Joaquin Courts, RN Phone Number: 08/07/2020, 4:35 PM  Clinical Narrative:    CM received authorization for SNF, reference # [id number] approved for 7 days from date of admission to facility.  PTAR transport approval # [id number].  [facility] is ready to accept patient, DC summary faxed to facility.  PTAR transport arranged.    Expected Discharge Plan: Skilled Nursing Facility Barriers to Discharge: Continued Medical Work up  Expected Discharge Plan and Services Expected Discharge Plan: [place]   Discharge Planning Services: CM Consult Post Acute Care Choice: [place]   Expected Discharge Date: 08/07/20                                     Social Determinants of Health (SDOH) Interventions    Readmission Risk Interventions No flowsheet data found.

## 2020-08-07 NOTE — TOC Progression Note (Signed)
Transition of Care [facility]) - Progression Note    Patient Details  Name: Jacqueline Burke MRN: [id number] Date of Birth: [birth year]  Transition of Care [facility]) CM/SW Contact  Joaquin Courts, RN Phone Number: 08/07/2020, 12:05 PM  Clinical Narrative:    Spouse and patient chose [facility].  CM confirmed bed is available to day.  Covid test is resulted (negative).  [facility] notified of facility choice, insurance Josem Kaufmann is still pending.   Expected Discharge Plan: Skilled Nursing Facility Barriers to Discharge: Continued Medical Work up  Expected Discharge Plan and Services Expected Discharge Plan: [place]   Discharge Planning Services: CM Consult Post Acute Care Choice: [place]   Expected Discharge Date: 08/07/20                                     Social Determinants of Health (SDOH) Interventions    Readmission Risk Interventions No flowsheet data found.

## 2020-08-07 NOTE — Progress Notes (Signed)
Report called to [facility]. Awaiting PTAR to transport. Will continue to monitor.

## 2020-08-07 NOTE — Discharge Summary (Signed)
[facility]  Physician Discharge Summary   Patient ID: Jacqueline Burke MRN: [id number] DOB/AGE: 05-Mar-1936 85 y.o.  Admit date: 08/03/2020 Discharge date: 08/07/2020  PCP: Glenis Smoker, MD  DISCHARGE DIAGNOSES:  Intertrochanteric fracture of the left hip status post ORIF Transient altered mental status, resolved Acute urinary retention, resolved Normocytic anemia/acute blood loss anemia Coronary artery disease stable Hyperlipidemia Essential hypertension Chronic kidney disease stage IIIa Interstitial lung disease as a sequelae of COVID-19 Acute respiratory failure with hypoxia  RECOMMENDATIONS FOR OUTPATIENT FOLLOW UP: 1. CBC and basic metabolic panel next week 2. Check oxygen saturations levels daily.  Home Health: To SNF Equipment/Devices: None  CODE STATUS: Full code  DISCHARGE CONDITION: fair  Diet recommendation: Heart healthy  INITIAL HISTORY: 85 y.o.femalewith medical history significant ofHTN, PVCs, ASCAD s/p NSTEMI in setting of acute respiratory failure and e.coli bacteremia in January of 2019, pulmonary fibrosis, CKD stage 3 and allergies who presented to ER via EMS after fall.  She sustained a fracture involving left hip.  Patient was hospitalized for further management.   Consultants: Orthopedics  Procedures: Intra medullary nail left hip on 3/5    HOSPITAL COURSE:   Intertrochanteric fracture of the left hip Orthopedics was consulted and the patient underwent surgery on 3/5.  Patient seen by PT and OT.  SNF is recommended which the patient is agreeable to.  Pain seems to be adequately controlled.  Bowel regimen.  Transient altered mental status Patient had episodes of agitation overnight on 3/5.    Patient is now back to baseline.  Continue to reorient daily.  No focal deficits.     Acute urinary retention Had Foley catheter on the first day of admission but it was discontinued on 3/6.  Noted to have some urinary retention  this morning.  But has been able to void on her own.  No need to initiate Flomax at this time.  Normocytic anemia/acute blood loss anemia Drop in hemoglobin likely due to combination of dilution as well as operative losses.  No obvious bruising noted over the lower extremity although the operative site is swollen.    She was given 1 unit of blood transfusion with improvement in hemoglobin.    Hemoglobin is stable.  History of coronary artery disease Status post MI in 2019 with cardiac cath revealing 95% RCA stenosis which was followed by PCI with a drug-eluting stent.  Patient has been on aspirin and Plavix.   Antiplatelet agents were held for surgery.  They have been resumed now.  Patient remained stable from a cardiac standpoint.  Continue statin.    Appeared to be slightly fluid overloaded yesterday and was given furosemide with good diuresis and improvement in symptoms.    Hyperlipidemia Continue statin.  Essential hypertension Blood pressure was low in the perioperative period.  Antihypertensives were held.  Now blood pressures in the hypertensive range.  Could resume her amlodipine.  Metoprolol dose was reduced initially and now can be changed back to her usual home dosage.   Acute on chronic kidney disease stage IIIa Noted to have elevated BUN and creatinine likely due to poor oral intake.  Patient was hydrated.  Renal function has improved.    Interstitial lung disease as a sequelae of COVID-19/acute respiratory failure with hypoxia Hypoxia thought to be mainly due to narcotics but patient does have ILD due to sequelae of COVID-19.  Followed by Dr. Lamonte Sakai.  Last seen by him in October 2021.  Currently requiring 1 L of oxygen.  Can  see if can be weaned off.  However she may need oxygen going forward.    Obesity Estimated body mass index is 31.41 kg/m as calculated from the following:   Height as of this encounter: 5\' 1"  (1.549 m).   Weight as of this encounter: 75.4  kg.   Overall she is stable.  Okay for discharge to SNF when bed is available.   PERTINENT LABS:  The results of significant diagnostics from this hospitalization (including imaging, microbiology, ancillary and laboratory) are listed below for reference.    Microbiology: Recent Results (from the past 240 hour(s))  Resp Panel by RT-PCR (Flu A&B, Covid) Nasopharyngeal Swab     Status: None   Collection Time: 08/03/20  9:39 PM   Specimen: Nasopharyngeal Swab; Nasopharyngeal(NP) swabs in vial transport medium  Result Value Ref Range Status   SARS Coronavirus 2 by RT PCR NEGATIVE NEGATIVE Final    Comment: (NOTE) SARS-CoV-2 target nucleic acids are NOT DETECTED.  The SARS-CoV-2 RNA is generally detectable in upper respiratory specimens during the acute phase of infection. The lowest concentration of SARS-CoV-2 viral copies this assay can detect is 138 copies/mL. A negative result does not preclude SARS-Cov-2 infection and should not be used as the sole basis for treatment or other patient management decisions. A negative result may occur with  improper specimen collection/handling, submission of specimen other than nasopharyngeal swab, presence of viral mutation(s) within the areas targeted by this assay, and inadequate number of viral copies(<138 copies/mL). A negative result must be combined with clinical observations, patient history, and epidemiological information. The expected result is Negative.  Fact Sheet for Patients:  EntrepreneurPulse.com.au  Fact Sheet for Healthcare Providers:  IncredibleEmployment.be  This test is no t yet approved or cleared by the [place] FDA and  has been authorized for detection and/or diagnosis of SARS-CoV-2 by FDA under an Emergency Use Authorization (EUA). This EUA will remain  in effect (meaning this test can be used) for the duration of the COVID-19 declaration under Section 564(b)(1) of the Act,  21 U.S.C.section 360bbb-3(b)(1), unless the authorization is terminated  or revoked sooner.       Influenza A by PCR NEGATIVE NEGATIVE Final   Influenza B by PCR NEGATIVE NEGATIVE Final    Comment: (NOTE) The Xpert Xpress SARS-CoV-2/FLU/RSV plus assay is intended as an aid in the diagnosis of influenza from Nasopharyngeal swab specimens and should not be used as a sole basis for treatment. Nasal washings and aspirates are unacceptable for Xpert Xpress SARS-CoV-2/FLU/RSV testing.  Fact Sheet for Patients: EntrepreneurPulse.com.au  Fact Sheet for Healthcare Providers: IncredibleEmployment.be  This test is not yet approved or cleared by the [place] FDA and has been authorized for detection and/or diagnosis of SARS-CoV-2 by FDA under an Emergency Use Authorization (EUA). This EUA will remain in effect (meaning this test can be used) for the duration of the COVID-19 declaration under Section 564(b)(1) of the Act, 21 U.S.C. section 360bbb-3(b)(1), unless the authorization is terminated or revoked.  Performed at [facility], [place] [address], [place], [place] [address]   Surgical PCR screen     Status: None   Collection Time: 08/04/20  2:51 AM   Specimen: Nasal Mucosa; Nasal Swab  Result Value Ref Range Status   MRSA, PCR NEGATIVE NEGATIVE Final   Staphylococcus aureus NEGATIVE NEGATIVE Final    Comment: (NOTE) The Xpert SA Assay (FDA approved for NASAL specimens in patients 32 years of age and older), is one component of a  comprehensive surveillance program. It is not intended to diagnose infection nor to guide or monitor treatment. Performed at [facility], [place] [address] [place],  [address]      Labs:  COVID-19 Labs   Lab Results  Component Value Date   [place] NEGATIVE 08/03/2020      Basic Metabolic Panel: Recent Labs  Lab 08/03/20 2016 08/04/20 0429 08/05/20 0401  08/06/20 0438 08/07/20 0435  NA 140 140 139 139 138  K 4.0 4.7 4.1 4.4 3.8  CL 107 106 107 105 104  CO2 24 23 22 27 26   GLUCOSE 141* 182* 175* 108* 103*  BUN 24* 25* 29* 22 21  CREATININE 1.02* 1.24* 1.33* 1.07* 0.93  CALCIUM 9.3 9.4 8.0* 8.6* 8.4*   CBC: Recent Labs  Lab 08/03/20 2016 08/04/20 0429 08/05/20 0401 08/05/20 1159 08/06/20 0438 08/07/20 0435  WBC 13.9* 14.9* 12.9*  --  11.6* 9.9  NEUTROABS 9.8*  --   --   --   --   --   HGB 11.4* 11.7* 7.6* 7.1* 9.2* 9.0*  HCT 35.2* 37.1 23.7* 22.0* 28.7* 27.0*  MCV 94.6 96.4 96.0  --  93.8 93.1  PLT 231 235 143*  --  159 170    CBG: Recent Labs  Lab 08/04/20 2200  GLUCAP 153*     IMAGING STUDIES Pelvis Portable  Result Date: 08/04/2020 CLINICAL DATA:  Postop left hip fracture fixation. EXAM: PORTABLE PELVIS 1-2 VIEWS COMPARISON:  Preoperative radiograph yesterday. FINDINGS: Intramedullary nail with trans trochanteric screw fixation of proximal femur fracture. Fracture is in improved alignment compared to preoperative imaging. The distal aspect of the femoral nail is not included in the field of view. Recent postsurgical change includes air and edema in the soft tissues. IMPRESSION: ORIF of proximal femur fracture without immediate postoperative complication. Distal aspect of the femoral nail is not included in the field of view. Electronically Signed   By: Keith Rake M.D.   On: 08/04/2020 23:11   DG CHEST PORT 1 VIEW  Result Date: 08/04/2020 CLINICAL DATA:  Hypoxia EXAM: PORTABLE CHEST 1 VIEW COMPARISON:  02/08/2020 FINDINGS: Pulmonary insufflation has diminished slightly since prior examination though lung volumes are still normal. Superimposed interstitial thickening is again identified, compatible with chronic interstitial changes noted on prior CT examination of 03/22/2020. No superimposed focal pulmonary infiltrate or nodule. No pneumothorax or pleural effusion. Cardiac size within normal limits. Pulmonary  vascularity is normal. IMPRESSION: Stable chronic interstitial changes. No radiographic evidence of acute cardiopulmonary disease. Electronically Signed   By: Fidela Salisbury MD   On: 08/04/2020 00:23   DG C-Arm 1-60 Min-No Report  Result Date: 08/04/2020 Fluoroscopy was utilized by the requesting physician.  No radiographic interpretation.   DG HIP OPERATIVE UNILAT W OR W/O PELVIS LEFT  Result Date: 08/04/2020 CLINICAL DATA:  Left femoral IM nail. EXAM: OPERATIVE LEFT HIP (WITH PELVIS IF PERFORMED) TECHNIQUE: Fluoroscopic spot image(s) were submitted for interpretation post-operatively. COMPARISON:  Preoperative hip radiograph yesterday. FINDINGS: Four fluoroscopic spot views obtained in the operating room in frontal and lateral projections. Intramedullary nail with trans trochanteric and distal locking screws traverses intertrochanteric femur fracture. Improved alignment from preoperative imaging. Knee arthroplasty is partially included. Fluoroscopy time 1 minutes 14 seconds. Dose 19.1 mGy. IMPRESSION: Fluoroscopic spot views after ORIF left proximal femur fracture. Electronically Signed   By: Keith Rake M.D.   On: 08/04/2020 20:31   DG Hip Unilat With Pelvis 2-3 Views Left  Result Date: 08/03/2020 CLINICAL DATA:  Fall, leg shortening EXAM: DG HIP (WITH OR WITHOUT PELVIS) 2-3V LEFT COMPARISON:  Radiograph 07/02/2020 FINDINGS: Comminuted fracture the proximal left femur with intertrochanteric and subtrochanteric fracture lines. Slight varus angulation and minimal foreshortening. Femoral head remains normally located. No other acute fracture or traumatic osseous injury of the pelvis. Degenerative changes in the lower spine, SI joints and bilateral hips are mild-to-moderate. No suspicious lytic or blastic lesions. Left hip swelling is present. Remaining soft tissues are unremarkable. IMPRESSION: Comminuted fracture of the proximal left femur with intertrochanteric and subtrochanteric fracture lines.  Slight foreshortening and varus angulation across the fracture. Electronically Signed   By: Lovena Le M.D.   On: 08/03/2020 19:58    DISCHARGE EXAMINATION: Vitals:   08/06/20 0932 08/06/20 1459 08/06/20 2146 08/07/20 0537  BP: (!) 157/87 (!) 158/71 (!) 160/73 (!) 145/71  Pulse: 93 88 93 82  Resp: 20 18 18 18   Temp:  (!) 97.5 F (36.4 C) 98.3 F (36.8 C) 98.4 F (36.9 C)  TempSrc:    Oral  SpO2: 92% 95% 92% 92%  Weight:      Height:       General appearance: Awake alert.  In no distress Resp: Clear to auscultation bilaterally.  Normal effort Cardio: S1-S2 is normal regular.  No S3-S4.  No rubs murmurs or bruit GI: Abdomen is soft.  Nontender nondistended.  Bowel sounds are present normal.  No masses organomegaly Swelling over the left leg is stable.  No bruising.      DISPOSITION: SNF  Discharge Instructions    Call MD for:  difficulty breathing, headache or visual disturbances   Complete by: As directed    Call MD for:  extreme fatigue   Complete by: As directed    Call MD for:  persistant dizziness or light-headedness   Complete by: As directed    Call MD for:  persistant nausea and vomiting   Complete by: As directed    Call MD for:  severe uncontrolled pain   Complete by: As directed    Call MD for:  temperature >100.4   Complete by: As directed    Diet - low sodium heart healthy   Complete by: As directed    Discharge instructions   Complete by: As directed    Please review instructions on the discharge summary.  You were cared for by a hospitalist during your hospital stay. If you have any questions about your discharge medications or the care you received while you were in the hospital after you are discharged, you can call the unit and asked to speak with the hospitalist on call if the hospitalist that took care of you is not available. Once you are discharged, your primary care physician will handle any further medical issues. Please note that NO REFILLS for  any discharge medications will be authorized once you are discharged, as it is imperative that you return to your primary care physician (or establish a relationship with a primary care physician if you do not have one) for your aftercare needs so that they can reassess your need for medications and monitor your lab values. If you do not have a primary care physician, you can call [phone] for a physician referral.   Increase activity slowly   Complete by: As directed    No wound care   Complete by: As directed          Allergies as of 08/07/2020      Reactions   Ciprofloxacin Hives  Codeine Hives   Demerol [meperidine] Hives   Hydrogen Peroxide Other (See Comments)   White blisters   Propoxyphene Other (See Comments)   Unknown Other reaction(s): OTHER   Tramadol Other (See Comments)   "ITCHY ON THE INSIDE"      Medication List    TAKE these medications   acetaminophen 500 MG tablet Commonly known as: TYLENOL Take 1,000 mg by mouth every 8 (eight) hours as needed for moderate pain or headache.   amitriptyline 10 MG tablet Commonly known as: ELAVIL Take 20 mg by mouth at bedtime.   amLODipine 2.5 MG tablet Commonly known as: NORVASC Take 1 tablet (2.5 mg total) by mouth daily. What changed:   medication strength  how much to take  additional instructions  Another medication with the same name was removed. Continue taking this medication, and follow the directions you see here.   aspirin EC 81 MG tablet Take 81 mg by mouth daily. On hold for procedure   atorvastatin 80 MG tablet Commonly known as: LIPITOR Take 1 tablet (80 mg total) by mouth daily at 6 PM.   cetirizine 10 MG chewable tablet Commonly known as: ZYRTEC Chew 10 mg by mouth daily.   clopidogrel 75 MG tablet Commonly known as: PLAVIX Take 1 tablet by mouth once daily   docusate sodium 100 MG capsule Commonly known as: COLACE Take 200 mg by mouth daily.   FIBER PO Take 2 tablets by mouth  daily.   fluticasone 50 MCG/ACT nasal spray Commonly known as: FLONASE Place 2 sprays into both nostrils daily.   gabapentin 300 MG capsule Commonly known as: NEURONTIN Take 600 mg by mouth daily.   guaiFENesin 600 MG 12 hr tablet Commonly known as: MUCINEX Take 1 tablet (600 mg total) by mouth 2 (two) times daily.   HYDROcodone-acetaminophen 5-325 MG tablet Commonly known as: NORCO/VICODIN Take 1 tablet by mouth every 6 (six) hours as needed for severe pain. What changed: reasons to take this   metoprolol tartrate 25 MG tablet Commonly known as: LOPRESSOR Take 1 tablet by mouth twice daily   multivitamin with minerals Tabs tablet Take 1 tablet by mouth daily.   polyethylene glycol 17 g packet Commonly known as: MIRALAX / GLYCOLAX Take 17 g by mouth daily. Start taking on: August 08, 6597   ProAir RespiClick 357 (90 Base) MCG/ACT Aepb Generic drug: Albuterol Sulfate Inhale 1 puff into the lungs daily as needed (wheezing).   senna-docusate 8.6-50 MG tablet Commonly known as: Senokot-S Take 2 tablets by mouth at bedtime.   Spiriva HandiHaler 18 MCG inhalation capsule Generic drug: tiotropium Place 1 capsule (18 mcg total) into inhaler and inhale daily.   Trospium Chloride 60 MG Cp24 Take 60 mg by mouth daily.   VITAMIN C PO Take 1,000 mg by mouth daily.   VITAMIN D PO Take 3,000 Units by mouth daily.            Durable Medical Equipment  (From admission, onward)         Start     Ordered   08/04/20 2107  For home use only DME lightweight manual wheelchair with seat cushion  Once       Comments: Patient suffers from left hip fracture which impairs their ability to perform daily activities like bathing, dressing, feeding, grooming, and toileting in the home.  A cane or crutch will not resolve  issue with performing activities of daily living. A wheelchair will allow patient to safely perform daily activities.  Patient is not able to propel themselves in the  home using a standard weight wheelchair due to endurance. Patient can self propel in the lightweight wheelchair. Length of need 6 months . Accessories: elevating leg rests (ELRs), wheel locks, extensions and anti-tippers.   08/04/20 2108            Follow-up Information    Marlou Sa, Tonna Corner, MD. Schedule an appointment as soon as possible for a visit.   Specialty: Orthopedic Surgery Why: Make appointment to see Dr. Marlou Sa 10 days following your surgery Contact information: [address] [place]  [address] [phone]               TOTAL DISCHARGE TIME: 33 minutes  [facility] Pager on [email or web address]  08/07/2020, 10:14 AM

## 2020-08-07 NOTE — TOC Progression Note (Signed)
Transition of Care [facility]) - Progression Note    Patient Details  Name: Jacqueline Burke MRN: [id number] Date of Birth: Oct 16, 1935  [facility] CM/SW Contact  Joaquin Courts, RN Phone Number: 08/07/2020, 10:09 AM  Clinical Narrative:    CM provided spouse with bed offers, spouse to discuss with patient and select facility.  HTA notified and auth initiated for SNF and [facility] transport.  MD notified for repeat Covid test.     Expected Discharge Plan: [place] Barriers to Discharge: Continued Medical Work up  Expected Discharge Plan and Services Expected Discharge Plan: [place]   Discharge Planning Services: CM Consult Post Acute Care Choice: [place]   Expected Discharge Date: 08/07/20                                     Social Determinants of Health (SDOH) Interventions    Readmission Risk Interventions No flowsheet data found.

## 2020-08-07 NOTE — Plan of Care (Signed)
  Problem: Education: Goal: Knowledge of General Education information will improve Description: Including pain rating scale, medication(s)/side effects and non-pharmacologic comfort measures Outcome: Progressing   Problem: Health Behavior/Discharge Planning: Goal: Ability to manage health-related needs will improve Outcome: Progressing   Problem: Clinical Measurements: Goal: Ability to maintain clinical measurements within normal limits will improve Outcome: Progressing Goal: Will remain free from infection Outcome: Progressing Goal: Diagnostic test results will improve Outcome: Progressing Goal: Respiratory complications will improve Outcome: Progressing Goal: Cardiovascular complication will be avoided Outcome: Progressing   Problem: Elimination: Goal: Will not experience complications related to bowel motility Outcome: Progressing Goal: Will not experience complications related to urinary retention Outcome: Progressing   

## 2020-08-08 ENCOUNTER — Encounter: Payer: Self-pay | Admitting: Adult Health

## 2020-08-08 ENCOUNTER — Non-Acute Institutional Stay (SKILLED_NURSING_FACILITY): Payer: PPO | Admitting: Adult Health

## 2020-08-08 ENCOUNTER — Telehealth: Payer: Self-pay | Admitting: Orthopedic Surgery

## 2020-08-08 DIAGNOSIS — S72142A Displaced intertrochanteric fracture of left femur, initial encounter for closed fracture: Secondary | ICD-10-CM | POA: Diagnosis not present

## 2020-08-08 DIAGNOSIS — J849 Interstitial pulmonary disease, unspecified: Secondary | ICD-10-CM | POA: Diagnosis not present

## 2020-08-08 DIAGNOSIS — I10 Essential (primary) hypertension: Secondary | ICD-10-CM | POA: Diagnosis not present

## 2020-08-08 DIAGNOSIS — I2510 Atherosclerotic heart disease of native coronary artery without angina pectoris: Secondary | ICD-10-CM | POA: Diagnosis not present

## 2020-08-08 DIAGNOSIS — D62 Acute posthemorrhagic anemia: Secondary | ICD-10-CM | POA: Diagnosis not present

## 2020-08-08 NOTE — Telephone Encounter (Signed)
Okay for NWB operative extremity with [facility] okay for transfers only

## 2020-08-08 NOTE — Telephone Encounter (Signed)
Letter made and faxed to [phone]

## 2020-08-08 NOTE — Telephone Encounter (Signed)
Please advise 

## 2020-08-08 NOTE — Progress Notes (Signed)
Location:  [facility] [facility] Room Number: 124/A Place of Service:  SNF (31) Provider:  Durenda Age, DNP, FNP-BC  Patient Care Team: Glenis Smoker, MD as PCP - General (Family Medicine) Sueanne Margarita, MD as PCP - Cardiology (Cardiology)  Extended Emergency Contact Information Primary Emergency Contact: Paulino Door Address: [address]          [place], [place] [address] Johnnette Litter of [place] Phone: [phone] Relation: Spouse  Code Status:  Full Code  Goals of care: Advanced Directive information Advanced Directives 08/08/2020  Does Patient Have a Medical Advance Directive? No  Type of Advance Directive -  Does patient want to make changes to medical advance directive? No - Patient declined  Copy of [place] in Chart? -  Would patient like information on creating a medical advance directive? -     Chief Complaint  Patient presents with  . Hospitalization Follow-up    Hospitalization Follow Up     HPI:  Pt is a 85 y.o. female who was admitted to [facility] on 08/07/20 post [facility] admission 08/03/20 to 08/07/20. She has a PMH of hypertension, PVCs, ASCAD S/P NSTEMI in the setting of acute respiratory failure and E. Coli bacteremia in January 2019, pulmonary fibrosis, CKD stage 3 and allergies. She had a fall at home for which she sustained a left hip fracture. Orthopedics was consulted. She had intramedullary nail of left hip on 08/04/20. She had foley catheter on the first day of admission and was discontinued on 3/06. Hospital stay was complicated by urinary retention but now able to void on her own. Her hgb dropped to 7.6 and was transfused 1 unit of blood. At discharge, hgb was 9.0.  She was seen in her room. She stated that she has no pain on her left hip, 0/10.   Past Medical History:  Diagnosis Date  . Allergic rhinitis   . CAD (coronary artery disease), native  coronary artery    NSTEMI with 95% RCA s/p DES to RCA 06/2017  . Diverticulosis   . GERD (gastroesophageal reflux disease)   . Heart murmur    Systolic heart murmur with Aortic valve sclerosis by ECHO  . History of kidney stones   . Hyperlipidemia   . Hypertension   . Osteoarthritis    Erosive OA-MRI of R hand negative for synovitis, only OA-Dr Trudie Reed  . Osteopenia   . Pelvic prolapse   . PVC's (premature ventricular contractions)    Past Surgical History:  Procedure Laterality Date  . BUNIONECTOMY  08/2006   R foot and hammer roe right second toe  . CARDIAC CATHETERIZATION    . CARPAL TUNNEL RELEASE  2002   bil hands  . CORONARY STENT INTERVENTION N/A 06/15/2017   Procedure: CORONARY STENT INTERVENTION;  Surgeon: Lorretta Harp, MD;  Location: [facility];  Service: Cardiovascular;  Laterality: N/A;  . CYSTOSCOPY     with laser lithotripsy Dr. Gloriann Loan 07-20-17   . CYSTOSCOPY W/ URETERAL STENT PLACEMENT Left 06/09/2017   Procedure: CYSTOSCOPY WITH RETROGRADE PYELOGRAM/URETERAL STENT PLACEMENT;  Surgeon: Lucas Mallow, MD;  Location: [facility];  Service: Urology;  Laterality: Left;  . CYSTOSCOPY WITH RETROGRADE PYELOGRAM, URETEROSCOPY AND STENT PLACEMENT Left 07/20/2017   Procedure: CYSTOSCOPY WITH RETROGRADE PYELOGRAM, LEFT URETEROSCOPY HOLMIUM LASER LITHO  AND STENT EXCHANGE;  Surgeon: Lucas Mallow, MD;  Location: [facility];  Service: Urology;  Laterality: Left;  .  HOLMIUM LASER APPLICATION Left 12/01/7791   Procedure: HOLMIUM LASER APPLICATION;  Surgeon: Lucas Mallow, MD;  Location: [facility];  Service: Urology;  Laterality: Left;  . LEFT HEART CATH AND CORONARY ANGIOGRAPHY N/A 06/15/2017   Procedure: LEFT HEART CATH AND CORONARY ANGIOGRAPHY;  Surgeon: Lorretta Harp, MD;  Location: [facility];  Service: Cardiovascular;  Laterality: N/A;  . TOTAL KNEE ARTHROPLASTY Left 08/2007  . TOTAL KNEE ARTHROPLASTY Right 03/2010    Allergies  Allergen Reactions  .  Ciprofloxacin Hives  . Codeine Hives  . Demerol [Meperidine] Hives  . Hydrogen Peroxide Other (See Comments)    White blisters  . Propoxyphene Other (See Comments)    Unknown Other reaction(s): OTHER  . Tramadol Other (See Comments)    "ITCHY ON THE INSIDE"    Outpatient Encounter Medications as of 08/08/2020  Medication Sig  . acetaminophen (TYLENOL) 325 MG tablet Take 650 mg by mouth every 8 (eight) hours as needed.  . Albuterol Sulfate (PROAIR RESPICLICK) 903 (90 Base) MCG/ACT AEPB Inhale 1 puff into the lungs daily as needed (wheezing).  Marland Kitchen amitriptyline (ELAVIL) 10 MG tablet Take 20 mg by mouth at bedtime.   Marland Kitchen amLODipine (NORVASC) 2.5 MG tablet Take 1 tablet (2.5 mg total) by mouth daily.  . Ascorbic Acid (VITAMIN C PO) Take 1,000 mg by mouth daily.   Marland Kitchen aspirin EC 81 MG tablet Take 81 mg by mouth daily. On hold for procedure  . atorvastatin (LIPITOR) 80 MG tablet Take 1 tablet (80 mg total) by mouth daily at 6 PM.  . cetirizine (ZYRTEC) 10 MG chewable tablet Chew 10 mg by mouth daily.  . Cholecalciferol (VITAMIN D PO) Take 3,000 Units by mouth daily.   . clopidogrel (PLAVIX) 75 MG tablet Take 1 tablet by mouth once daily  . docusate sodium (COLACE) 100 MG capsule Take 100 mg by mouth daily.  Marland Kitchen FIBER PO Take 2 tablets by mouth daily.  . fluticasone (FLONASE) 50 MCG/ACT nasal spray Place 2 sprays into both nostrils daily.  Marland Kitchen gabapentin (NEURONTIN) 300 MG capsule Take 600 mg by mouth daily.  Marland Kitchen guaiFENesin (MUCINEX) 600 MG 12 hr tablet Take 1 tablet (600 mg total) by mouth 2 (two) times daily.  Marland Kitchen HYDROcodone-acetaminophen (NORCO/VICODIN) 5-325 MG tablet Take 1 tablet by mouth every 6 (six) hours as needed for severe pain.  . metoprolol tartrate (LOPRESSOR) 25 MG tablet Take 1 tablet by mouth twice daily  . Multiple Vitamin (MULTIVITAMIN WITH MINERALS) TABS tablet Take 1 tablet by mouth daily.  . polyethylene glycol (MIRALAX / GLYCOLAX) 17 g packet Take 17 g by mouth daily.  Marland Kitchen  senna-docusate (SENOKOT-S) 8.6-50 MG tablet Take 2 tablets by mouth at bedtime.  Marland Kitchen tiotropium (SPIRIVA HANDIHALER) 18 MCG inhalation capsule Place 1 capsule (18 mcg total) into inhaler and inhale daily.  . Trospium Chloride 60 MG CP24 Take 60 mg by mouth daily.  . [DISCONTINUED] acetaminophen (TYLENOL) 500 MG tablet Take 1,000 mg by mouth every 8 (eight) hours as needed for moderate pain or headache.   No facility-administered encounter medications on file as of 08/08/2020.    Review of Systems  GENERAL: No change in appetite, no fatigue, no weight changes, no fever, chills or weakness MOUTH and THROAT: Denies oral discomfort, gingival pain or bleeding RESPIRATORY: no cough, SOB, DOE, wheezing, hemoptysis CARDIAC: No chest pain, edema or palpitations GI: No abdominal pain, diarrhea, constipation, heart burn, nausea or vomiting GU: Denies dysuria, frequency, hematuria, incontinence, or discharge NEUROLOGICAL:  Denies dizziness, syncope, numbness, or headache PSYCHIATRIC: Denies feelings of depression or anxiety. No report of hallucinations, insomnia, paranoia, or agitation   Immunization History  Administered Date(s) Administered  . Influenza Split 02/10/2008, 02/12/2009, 03/03/2011, 02/14/2013, 02/10/2018, 01/18/2019, 02/22/2020  . Influenza, High Dose Seasonal PF 03/26/2013  . Influenza,inj,Quad PF,6+ Mos 02/21/2016, 03/26/2017  . Influenza,inj,quad, With Preservative 04/11/2014, 03/22/2015  . PFIZER(Purple Top)SARS-COV-2 Vaccination 07/24/2019, 08/16/2019, 03/15/2020  . Pneumococcal Conjugate-13 08/04/2013, 02/22/2020  . Pneumococcal Polysaccharide-23 05/12/2003, 06/14/2009  . Td 02/10/2008  . Tdap 03/28/2019  . Zoster 11/14/2005, 05/03/2018  . Zoster Recombinat (Shingrix) 05/03/2018, 07/30/2018   Pertinent  Health Maintenance Due  Topic Date Due  . INFLUENZA VACCINE  Completed  . DEXA SCAN  Completed  . PNA vac Low Risk Adult  Completed   Fall Risk  01/28/2018  Falls in the  past year? No     Vitals:   08/08/20 1241  BP: (!) 141/75  Pulse: 72  Resp: 18  Temp: 98.3 F (36.8 C)  Weight: 166 lb 3.7 oz (75.4 kg)  Height: 5\' 1"  (1.549 m)   Body mass index is 31.41 kg/m.  Physical Exam  GENERAL APPEARANCE: Well nourished. In no acute distress. Obese SKIN: Left hip/thigh with 3 surgical wound with aquacel dressing, dry and no erythema MOUTH and THROAT: Lips are without lesions. Oral mucosa is moist and without lesions. Tongue is normal in shape, size, and color and without lesions RESPIRATORY: Breathing is even & unlabored, BS CTAB CARDIAC: RRR, no murmur,no extra heart sounds, no edema GI: Abdomen soft, normal BS, no masses, no tenderness NEUROLOGICAL: There is no tremor. Speech is clear. Alert and oriented X 3.   PSYCHIATRIC: Affect and behavior are appropriate  Labs reviewed: Recent Labs    08/05/20 0401 08/06/20 0438 08/07/20 0435  NA 139 139 138  K 4.1 4.4 3.8  CL 107 105 104  CO2 22 27 26   GLUCOSE 175* 108* 103*  BUN 29* 22 21  CREATININE 1.33* 1.07* 0.93  CALCIUM 8.0* 8.6* 8.4*   Recent Labs    03/22/20 0953  ALT 17   Recent Labs    08/03/20 2016 08/04/20 0429 08/05/20 0401 08/05/20 1159 08/06/20 0438 08/07/20 0435  WBC 13.9*   < > 12.9*  --  11.6* 9.9  NEUTROABS 9.8*  --   --   --   --   --   HGB 11.4*   < > 7.6* 7.1* 9.2* 9.0*  HCT 35.2*   < > 23.7* 22.0* 28.7* 27.0*  MCV 94.6   < > 96.0  --  93.8 93.1  PLT 231   < > 143*  --  159 170   < > = values in this interval not displayed.   Lab Results  Component Value Date   TSH 2.360 02/24/2018   Lab Results  Component Value Date   HGBA1C 6.1 (H) 08/04/2020   Lab Results  Component Value Date   CHOL 79 06/16/2017   HDL 23 (L) 06/16/2017   LDLCALC 33 06/16/2017   TRIG 116 06/16/2017   CHOLHDL 3.4 06/16/2017    Significant Diagnostic Results in last 30 days:  Pelvis Portable  Result Date: 08/04/2020 CLINICAL DATA:  Postop left hip fracture fixation. EXAM:  PORTABLE PELVIS 1-2 VIEWS COMPARISON:  Preoperative radiograph yesterday. FINDINGS: Intramedullary nail with trans trochanteric screw fixation of proximal femur fracture. Fracture is in improved alignment compared to preoperative imaging. The distal aspect of the femoral nail is not included in the field  of view. Recent postsurgical change includes air and edema in the soft tissues. IMPRESSION: ORIF of proximal femur fracture without immediate postoperative complication. Distal aspect of the femoral nail is not included in the field of view. Electronically Signed   By: Keith Rake M.D.   On: 08/04/2020 23:11   DG CHEST PORT 1 VIEW  Result Date: 08/04/2020 CLINICAL DATA:  Hypoxia EXAM: PORTABLE CHEST 1 VIEW COMPARISON:  02/08/2020 FINDINGS: Pulmonary insufflation has diminished slightly since prior examination though lung volumes are still normal. Superimposed interstitial thickening is again identified, compatible with chronic interstitial changes noted on prior CT examination of 03/22/2020. No superimposed focal pulmonary infiltrate or nodule. No pneumothorax or pleural effusion. Cardiac size within normal limits. Pulmonary vascularity is normal. IMPRESSION: Stable chronic interstitial changes. No radiographic evidence of acute cardiopulmonary disease. Electronically Signed   By: Fidela Salisbury MD   On: 08/04/2020 00:23   DG C-Arm 1-60 Min-No Report  Result Date: 08/04/2020 Fluoroscopy was utilized by the requesting physician.  No radiographic interpretation.   DG HIP OPERATIVE UNILAT W OR W/O PELVIS LEFT  Result Date: 08/04/2020 CLINICAL DATA:  Left femoral IM nail. EXAM: OPERATIVE LEFT HIP (WITH PELVIS IF PERFORMED) TECHNIQUE: Fluoroscopic spot image(s) were submitted for interpretation post-operatively. COMPARISON:  Preoperative hip radiograph yesterday. FINDINGS: Four fluoroscopic spot views obtained in the operating room in frontal and lateral projections. Intramedullary nail with trans  trochanteric and distal locking screws traverses intertrochanteric femur fracture. Improved alignment from preoperative imaging. Knee arthroplasty is partially included. Fluoroscopy time 1 minutes 14 seconds. Dose 19.1 mGy. IMPRESSION: Fluoroscopic spot views after ORIF left proximal femur fracture. Electronically Signed   By: Keith Rake M.D.   On: 08/04/2020 20:31   DG Hip Unilat With Pelvis 2-3 Views Left  Result Date: 08/03/2020 CLINICAL DATA:  Fall, leg shortening EXAM: DG HIP (WITH OR WITHOUT PELVIS) 2-3V LEFT COMPARISON:  Radiograph 07/02/2020 FINDINGS: Comminuted fracture the proximal left femur with intertrochanteric and subtrochanteric fracture lines. Slight varus angulation and minimal foreshortening. Femoral head remains normally located. No other acute fracture or traumatic osseous injury of the pelvis. Degenerative changes in the lower spine, SI joints and bilateral hips are mild-to-moderate. No suspicious lytic or blastic lesions. Left hip swelling is present. Remaining soft tissues are unremarkable. IMPRESSION: Comminuted fracture of the proximal left femur with intertrochanteric and subtrochanteric fracture lines. Slight foreshortening and varus angulation across the fracture. Electronically Signed   By: Lovena Le M.D.   On: 08/03/2020 19:58    Assessment/Plan  1. Closed intertrochanteric fracture of hip, left -   S/P Intramedullary nail left hip on 3/05 -    Continue Norco 5-325 mg 1 tab every 6 hours PRN for pain -    Nonweightbearing on LLE, touchdown weightbearing for transfers -    Follow-up with orthopedics -    For PT and OT, for therapeutic strengthening exercises  2. Anemia due to acute blood loss Lab Results  Component Value Date   HGB 9.0 (L) 08/07/2020   -   S/P transfusion of 1 unit blood, will monitor  3. Coronary artery disease involving native coronary artery of native heart without angina pectoris -  S/P MI in 2019 with cardiac cath revealing 95% RCA  stenosis which was followed by PCI with a drug-eluting stent -    Continue aspirin EC 81 mg daily, Plavix 75 mg 1 tab daily (both were held for surgery and now resumed) -   Continue atorvastatin 80 mg 1 tab daily  4. Essential hypertension -   Continue amlodipine 2.5 mg daily  5. ILD (interstitial lung disease) ([place]) -   On O2 at 1 L/minute via [place] continuously -   Continue guaifenesin ER 600 mg 1 tab twice a day, proair 90 mcg inhale 1 puff into the lungs daily PRN, Spiriva 18 mcg 1 capsule into inhaler inhale daily   Family/ staff Communication: Discussed plan of care with resident and charge nurse.  Labs/tests ordered: None  Goals of care:   Short-term care   Durenda Age, DNP, MSN, FNP-BC [facility] and [facility] [phone] (Monday-Friday 8:00 a.m. - 5:00 p.m.) [phone] (after hours)

## 2020-08-08 NOTE — Telephone Encounter (Signed)
Velva Harman Engineer, maintenance) from [facility] called requesting a written order for weight baring status of patient. Ana asked for letter to be faxed to [phone]. [place] phone number is [phone] if we have any questions.

## 2020-08-09 ENCOUNTER — Non-Acute Institutional Stay (SKILLED_NURSING_FACILITY): Payer: PPO | Admitting: Internal Medicine

## 2020-08-09 ENCOUNTER — Encounter: Payer: Self-pay | Admitting: Internal Medicine

## 2020-08-09 DIAGNOSIS — N1831 Chronic kidney disease, stage 3a: Secondary | ICD-10-CM

## 2020-08-09 DIAGNOSIS — D649 Anemia, unspecified: Secondary | ICD-10-CM | POA: Diagnosis not present

## 2020-08-09 DIAGNOSIS — S72142A Displaced intertrochanteric fracture of left femur, initial encounter for closed fracture: Secondary | ICD-10-CM | POA: Diagnosis not present

## 2020-08-09 DIAGNOSIS — I10 Essential (primary) hypertension: Secondary | ICD-10-CM

## 2020-08-09 NOTE — Assessment & Plan Note (Signed)
PT/OT at SNF  Orthopedic follow-up as scheduled  

## 2020-08-09 NOTE — Assessment & Plan Note (Signed)
No evidence of active bleeding dyscrasia.

## 2020-08-09 NOTE — Assessment & Plan Note (Signed)
At discharge; CKD stage II Avoid nephrotoxic drugs.

## 2020-08-09 NOTE — Progress Notes (Signed)
NURSING HOME LOCATION:  [facility] ROOM NUMBER:  124 A  CODE STATUS:  FULL CODE  PCP:  Glenis Smoker., MD  This is a comprehensive admission note to [facility] performed on this date less than 30 days from date of admission. Included are preadmission medical/surgical history; reconciled medication list; family history; social history and comprehensive review of systems.  Corrections and additions to the records were documented. Comprehensive physical exam was also performed. Additionally a clinical summary was entered for each active diagnosis pertinent to this admission in the Problem List to enhance continuity of care.  HPI: Patient was hospitalized 3/4-08/07/2020 for intertrochanteric fracture of the left hip.  Antiplatelet agents were held for surgery;intramedullary nailing of was completed 3/5 by Dr Meredith Pel. Hospital course was complicated by transient altered mental status manifested as episodes of agitation overnight 3/5 which subsequently resolved.  Additionally she had acute urinary retention necessitating Foley catheterization at admission; this was discontinued 3/6 without recurrence.  Normocytic anemia was attributed to dilution as well as perioperative acute blood loss.  Resolution post 1 unit packed red cells. Postop there was question of volume overload for which she received furosemide with good diuresis and improvement in symptoms. Postop blood pressure was soft but subsequently blood pressures rose to the hypertensive range with resumption of amlodipine.  Metoprolol had been decreased initially but maintenance dose was resumed with the rise in blood pressure. She demonstrated hypoxia requiring 1 L nasal oxygen.  This was attributed to narcotics but this was in the context of ILD .  Past medical and surgical history: Includes CAD, dyslipidemia, essential hypertension, CKD stage IIIa, history of nephrolithiasis, GERD, diverticulosis, history of   interstitial lung disease & COVID-19 infection. Surgeries and procedures include PCI with drug-eluting stent s/p NSTEMI in the setting of acute respiratory failure and E. coli bacteremia in January 2019.  She is also had cystoscopy with ureteral stent placement.  Social history: Nondrinker; former 25-pack-year smoker. Formerly Dialysis Tech for decades.  She enjoys reading history.  Family history: Noncontributory due to advanced age   Review of systems: She is alert and oriented, very intelligent and provides an excellent history.  She denies any cardiac or neurologic prodrome prior to the fall.  She states that one of her boxers simply knocked her off her feet.  She has no residual Covid symptoms.  She does have a chronic cough with yellow/green sputum intermittently.  She has no bleeding dyscrasias.  Constitutional: No fever, significant weight change Eyes: No redness, discharge, pain, vision change ENT/mouth: No nasal congestion, purulent discharge, earache, change in hearing, sore throat  Cardiovascular: No chest pain, palpitations, paroxysmal nocturnal dyspnea, claudication, edema  Respiratory: No  hemoptysis,  significant snoring, apnea Gastrointestinal: No heartburn, dysphagia, abdominal pain, nausea /vomiting, rectal bleeding, melena, change in bowels Genitourinary: No dysuria, hematuria, pyuria, incontinence, nocturia Musculoskeletal: No joint stiffness, joint swelling, weakness, pain Dermatologic: No rash, pruritus, change in appearance of skin Neurologic: No dizziness, headache, syncope, seizures, numbness, tingling Psychiatric: No significant anxiety, depression, insomnia, anorexia Endocrine: No change in hair/skin/nails, excessive thirst, excessive hunger, excessive urination  Hematologic/lymphatic: No significant bruising, lymphadenopathy, abnormal bleeding Allergy/immunology: No itchy/watery eyes, significant sneezing, urticaria, angioedema  Physical exam:  Pertinent or  positive findings: She appears healthy and well-nourished.  Dental hygiene is excellent.  There are sticky rales at the right lower lobe and similar minor changes in the left lower lobe.  Pedal pulses are slightly decreased.  There is insignificant/trace edema at the  sock line.  Degenerative joint changes of the hands are noted in isolated fashion.  General appearance: Adequately nourished; no acute distress, increased work of breathing is present.   Lymphatic: No lymphadenopathy about the head, neck, axilla. Eyes: No conjunctival inflammation or lid edema is present. There is no scleral icterus. Ears:  External ear exam shows no significant lesions or deformities.   Nose:  External nasal examination shows no deformity or inflammation. Nasal mucosa are pink and moist without lesions, exudates Oral exam: Lips and gums are healthy appearing.There is no oropharyngeal erythema or exudate. Neck:  No thyromegaly, masses, tenderness noted.    Heart:  Normal rate and regular rhythm. S1 and S2 normal without gallop, murmur, click, rub.  Lungs:  without wheezes, rhonchi,  rubs. Abdomen: Bowel sounds are normal.  Abdomen is soft and nontender with no organomegaly, hernias, masses. GU: Deferred  Extremities:  No cyanosis, clubbing. Neurologic exam: Balance, Rhomberg, finger to nose testing could not be completed due to clinical state Deep tendon reflexes are equal Skin: Warm & dry w/o tenting. No significant lesions or rash.  See clinical summary under each active problem in the Problem List with associated updated therapeutic plan admi

## 2020-08-09 NOTE — Assessment & Plan Note (Addendum)
Systolic blood pressure is significantly elevated today.  This is an outlier.  If sustained hypertension is present; antihypertensive regimen will be titrated upward starting with increase of Amlodipine to 5 mg daily.

## 2020-08-09 NOTE — Patient Instructions (Signed)
See assessment and plan under each diagnosis in the problem list and acutely for this visit 

## 2020-08-14 ENCOUNTER — Non-Acute Institutional Stay (SKILLED_NURSING_FACILITY): Payer: PPO | Admitting: Adult Health

## 2020-08-14 ENCOUNTER — Encounter (HOSPITAL_COMMUNITY): Payer: Self-pay | Admitting: Orthopedic Surgery

## 2020-08-14 DIAGNOSIS — I10 Essential (primary) hypertension: Secondary | ICD-10-CM

## 2020-08-14 DIAGNOSIS — K521 Toxic gastroenteritis and colitis: Secondary | ICD-10-CM | POA: Diagnosis not present

## 2020-08-14 DIAGNOSIS — J849 Interstitial pulmonary disease, unspecified: Secondary | ICD-10-CM | POA: Diagnosis not present

## 2020-08-14 DIAGNOSIS — D62 Acute posthemorrhagic anemia: Secondary | ICD-10-CM

## 2020-08-14 DIAGNOSIS — N1831 Chronic kidney disease, stage 3a: Secondary | ICD-10-CM

## 2020-08-14 NOTE — Progress Notes (Signed)
Location:  [place] Room Number: [place] of Service:  SNF (31) Provider:  Durenda Age, DNP, FNP-BC  Patient Care Team: Glenis Smoker, MD as PCP - General (Family Medicine) Sueanne Margarita, MD as PCP - Cardiology (Cardiology)  Extended Emergency Contact Information Primary Emergency Contact: Paulino Door Address: [address]          [place], [place] [address] Johnnette Litter of [place] Phone: [phone] Relation: Spouse  Code Status:    Goals of care: Advanced Directive information Advanced Directives 08/09/2020  Does Patient Have a Medical Advance Directive? No  Type of Advance Directive -  Does patient want to make changes to medical advance directive? -  Copy of [place] in Chart? -  Would patient like information on creating a medical advance directive? -     Chief Complaint  Patient presents with  . Acute Visit    Short-term Rehabilitation    HPI:  Pt is a 85 y.o. female seen today for medical management of chronic diseases. She is a short-term rehabilitation resident of [facility]. She has a PMH of hypertension, PVCs, ASCAD S/P NSTEMI in setting of acute respiratory failure and e. Coli bacteremia in January 2019, pulmonary fibrosis and CKD stage 3. She was admitted to [facility] on 08/07/20 post hospitalization 08/03/20 to 08/07/20 S/P fall sustaining a left hip fracture S/P intramedullary nailing on 08/04/20. She is currently having PT and OT. She was seen in her room today. She verbalized moving her bowels several times a day. Noted that she takes Fiber-Lax 2 tabs daily and Docusate Sodium 100 mg 2 capsules = 200 mg daily. She stated that these were the ones she takes at home. She was, also, noted to have Miralax 17 gm daily and Senexon-S 50.8.6 mg 2 tabs at bedtime. She stated that she will follow up with orthopedics for her Left hip fracture S/P IM  nailing on 08/04/20. She stated that the only time her LLE hurt is when she moves. O2 was recently discontinued  and no noted SOB.    Past Medical History:  Diagnosis Date  . Allergic rhinitis   . CAD (coronary artery disease), native coronary artery    NSTEMI with 95% RCA s/p DES to RCA 06/2017  . Diverticulosis   . GERD (gastroesophageal reflux disease)   . Heart murmur    Systolic heart murmur with Aortic valve sclerosis by ECHO  . History of kidney stones   . Hyperlipidemia   . Hypertension   . Osteoarthritis    Erosive OA-MRI of R hand negative for synovitis, only OA-Dr Trudie Reed  . Osteopenia   . Pelvic prolapse   . PVC's (premature ventricular contractions)    Past Surgical History:  Procedure Laterality Date  . BUNIONECTOMY  08/2006   R foot and hammer roe right second toe  . CARDIAC CATHETERIZATION    . CARPAL TUNNEL RELEASE  2002   bil hands  . CORONARY STENT INTERVENTION N/A 06/15/2017   Procedure: CORONARY STENT INTERVENTION;  Surgeon: Lorretta Harp, MD;  Location: [place] [facility];  Service: Cardiovascular;  Laterality: N/A;  . CYSTOSCOPY     with laser lithotripsy Dr. Gloriann Loan 07-20-17   . CYSTOSCOPY W/ URETERAL STENT PLACEMENT Left 06/09/2017   Procedure: CYSTOSCOPY WITH RETROGRADE PYELOGRAM/URETERAL STENT PLACEMENT;  Surgeon: Lucas Mallow, MD;  [facility];  Service: Urology;  Laterality: Left;  . CYSTOSCOPY WITH RETROGRADE  PYELOGRAM, URETEROSCOPY AND STENT PLACEMENT Left 07/20/2017   Procedure: CYSTOSCOPY WITH RETROGRADE PYELOGRAM, LEFT URETEROSCOPY HOLMIUM LASER LITHO  AND STENT EXCHANGE;  Surgeon: Lucas Mallow, MD;  [facility];  Service: Urology;  Laterality: Left;  . HOLMIUM LASER APPLICATION Left 2/44/0102   Procedure: HOLMIUM LASER APPLICATION;  Surgeon: Lucas Mallow, MD;  Location: [facility];  Service: Urology;  Laterality: Left;  . INTRAMEDULLARY (IM) NAIL INTERTROCHANTERIC Left 08/04/2020   Procedure: INTRAMEDULLARY (IM) NAIL  INTERTROCHANTRIC WITH CABLES;  Surgeon: Meredith Pel, MD;  [facility];  Service: Orthopedics;  Laterality: Left;  . LEFT HEART CATH AND CORONARY ANGIOGRAPHY N/A 06/15/2017   Procedure: LEFT HEART CATH AND CORONARY ANGIOGRAPHY;  Surgeon: Lorretta Harp, MD;  Location: [facility];  Service: Cardiovascular;  Laterality: N/A;  . TOTAL KNEE ARTHROPLASTY Left 08/2007  . TOTAL KNEE ARTHROPLASTY Right 03/2010    Allergies  Allergen Reactions  . Ciprofloxacin Hives  . Codeine Hives  . Demerol [Meperidine] Hives  . Hydrogen Peroxide Other (See Comments)    White blisters  . Propoxyphene Other (See Comments)    Unknown Other reaction(s): OTHER  . Tramadol Other (See Comments)    "ITCHY ON THE INSIDE"    Outpatient Encounter Medications as of 08/14/2020  Medication Sig  . acetaminophen (TYLENOL) 325 MG tablet Take 650 mg by mouth every 8 (eight) hours as needed.  . Albuterol Sulfate (PROAIR RESPICLICK) 725 (90 Base) MCG/ACT AEPB Inhale 1 puff into the lungs daily as needed (wheezing).  Marland Kitchen amitriptyline (ELAVIL) 10 MG tablet Take 20 mg by mouth at bedtime.   Marland Kitchen amLODipine (NORVASC) 2.5 MG tablet Take 1 tablet (2.5 mg total) by mouth daily.  . Ascorbic Acid (VITAMIN C PO) Take 1,000 mg by mouth daily.   Marland Kitchen aspirin EC 81 MG tablet Take 81 mg by mouth daily. On hold for procedure  . atorvastatin (LIPITOR) 80 MG tablet Take 1 tablet (80 mg total) by mouth daily at 6 PM.  . cetirizine (ZYRTEC) 10 MG chewable tablet Chew 10 mg by mouth daily.  . Cholecalciferol (VITAMIN D PO) Take 3,000 Units by mouth daily.   . clopidogrel (PLAVIX) 75 MG tablet Take 1 tablet by mouth once daily  . docusate sodium (COLACE) 100 MG capsule Take 100 mg by mouth daily.  Marland Kitchen FIBER PO Take 2 tablets by mouth daily.  . fluticasone (FLONASE) 50 MCG/ACT nasal spray Place 2 sprays into both nostrils daily.  Marland Kitchen gabapentin (NEURONTIN) 300 MG capsule Take 600 mg by mouth daily.  Marland Kitchen guaiFENesin (MUCINEX) 600 MG 12  hr tablet Take 1 tablet (600 mg total) by mouth 2 (two) times daily.  Marland Kitchen HYDROcodone-acetaminophen (NORCO/VICODIN) 5-325 MG tablet Take 1 tablet by mouth every 6 (six) hours as needed for severe pain.  . Magnesium Hydroxide (MILK OF MAGNESIA PO) Take 30 mLs by mouth. As needed for constipation  . metoprolol tartrate (LOPRESSOR) 25 MG tablet Take 1 tablet by mouth twice daily  . Multiple Vitamin (MULTIVITAMIN WITH MINERALS) TABS tablet Take 1 tablet by mouth daily.  . polyethylene glycol (MIRALAX / GLYCOLAX) 17 g packet Take 17 g by mouth daily.  Marland Kitchen senna-docusate (SENOKOT-S) 8.6-50 MG tablet Take 2 tablets by mouth at bedtime.  Marland Kitchen tiotropium (SPIRIVA HANDIHALER) 18 MCG inhalation capsule Place 1 capsule (18 mcg total) into inhaler and inhale daily.  . Trospium Chloride 60 MG CP24 Take 60 mg by mouth daily.   No facility-administered encounter medications on file as  of 08/14/2020.    Review of Systems  GENERAL: No change in appetite, no fatigue, no weight changes, no fever, chills or weakness MOUTH and THROAT: Denies oral discomfort, gingival pain or bleeding RESPIRATORY: no cough, SOB, DOE, wheezing, hemoptysis CARDIAC: No chest pain, edema or palpitations GI: No abdominal pain, diarrhea, constipation, heart burn, nausea or vomiting GU: Denies dysuria, frequency, hematuria, incontinence, or discharge NEUROLOGICAL: Denies dizziness, syncope, numbness, or headache PSYCHIATRIC: Denies feelings of depression or anxiety. No report of hallucinations, insomnia, paranoia, or agitation   Immunization History  Administered Date(s) Administered  . Influenza Split 02/10/2008, 02/12/2009, 03/03/2011, 02/14/2013, 02/10/2018, 01/18/2019, 02/22/2020  . Influenza, High Dose Seasonal PF 03/26/2013  . Influenza,inj,Quad PF,6+ Mos 02/21/2016, 03/26/2017  . Influenza,inj,quad, With Preservative 04/11/2014, 03/22/2015  . PFIZER(Purple Top)SARS-COV-2 Vaccination 07/24/2019, 08/16/2019, 03/09/2020, 03/15/2020  .  Pneumococcal Conjugate-13 08/04/2013, 02/22/2020  . Pneumococcal Polysaccharide-23 05/12/2003, 06/14/2009  . Td 02/10/2008  . Tdap 03/28/2019  . Zoster 11/14/2005, 05/03/2018  . Zoster Recombinat (Shingrix) 05/03/2018, 07/30/2018   Pertinent  Health Maintenance Due  Topic Date Due  . INFLUENZA VACCINE  Completed  . DEXA SCAN  Completed  . PNA vac Low Risk Adult  Completed   Fall Risk  01/28/2018  Falls in the past year? No     Vitals:   08/14/20 1544  BP: 124/72  Pulse: 76  Resp: 18  Temp: (!) 97.5 F (36.4 C)  Weight: 158 lb 12.8 oz (72 kg)  Height: 5\' 1"  (1.549 m)   Body mass index is 30 kg/m.  Physical Exam  GENERAL APPEARANCE: Well nourished. In no acute distress. Obese SKIN:  Skin is warm and dry.  MOUTH and THROAT: Lips are without lesions. Oral mucosa is moist and without lesions. Tongue is normal in shape, size, and color and without lesions RESPIRATORY: Breathing is even & unlabored, BS CTAB CARDIAC: RRR, no murmur,no extra heart sounds, no edema GI: Abdomen soft, normal BS, no masses, no tenderness NEUROLOGICAL: There is no tremor. Speech is clear. Alert and oriented X 3.  PSYCHIATRIC: Affect and behavior are appropriate  Labs reviewed: Recent Labs    08/05/20 0401 08/06/20 0438 08/07/20 0435  NA 139 139 138  K 4.1 4.4 3.8  CL 107 105 104  CO2 22 27 26   GLUCOSE 175* 108* 103*  BUN 29* 22 21  CREATININE 1.33* 1.07* 0.93  CALCIUM 8.0* 8.6* 8.4*   Recent Labs    03/22/20 0953  ALT 17   Recent Labs    08/03/20 2016 08/04/20 0429 08/05/20 0401 08/05/20 1159 08/06/20 0438 08/07/20 0435  WBC 13.9*   < > 12.9*  --  11.6* 9.9  NEUTROABS 9.8*  --   --   --   --   --   HGB 11.4*   < > 7.6* 7.1* 9.2* 9.0*  HCT 35.2*   < > 23.7* 22.0* 28.7* 27.0*  MCV 94.6   < > 96.0  --  93.8 93.1  PLT 231   < > 143*  --  159 170   < > = values in this interval not displayed.   Lab Results  Component Value Date   TSH 2.360 02/24/2018   Lab Results   Component Value Date   HGBA1C 6.1 (H) 08/04/2020   Lab Results  Component Value Date   CHOL 79 06/16/2017   HDL 23 (L) 06/16/2017   LDLCALC 33 06/16/2017   TRIG 116 06/16/2017   CHOLHDL 3.4 06/16/2017    Significant Diagnostic Results in  last 30 days:  Pelvis Portable  Result Date: 08/04/2020 CLINICAL DATA:  Postop left hip fracture fixation. EXAM: PORTABLE PELVIS 1-2 VIEWS COMPARISON:  Preoperative radiograph yesterday. FINDINGS: Intramedullary nail with trans trochanteric screw fixation of proximal femur fracture. Fracture is in improved alignment compared to preoperative imaging. The distal aspect of the femoral nail is not included in the field of view. Recent postsurgical change includes air and edema in the soft tissues. IMPRESSION: ORIF of proximal femur fracture without immediate postoperative complication. Distal aspect of the femoral nail is not included in the field of view. Electronically Signed   By: Keith Rake M.D.   On: 08/04/2020 23:11   DG CHEST PORT 1 VIEW  Result Date: 08/04/2020 CLINICAL DATA:  Hypoxia EXAM: PORTABLE CHEST 1 VIEW COMPARISON:  02/08/2020 FINDINGS: Pulmonary insufflation has diminished slightly since prior examination though lung volumes are still normal. Superimposed interstitial thickening is again identified, compatible with chronic interstitial changes noted on prior CT examination of 03/22/2020. No superimposed focal pulmonary infiltrate or nodule. No pneumothorax or pleural effusion. Cardiac size within normal limits. Pulmonary vascularity is normal. IMPRESSION: Stable chronic interstitial changes. No radiographic evidence of acute cardiopulmonary disease. Electronically Signed   By: Fidela Salisbury MD   On: 08/04/2020 00:23   DG C-Arm 1-60 Min-No Report  Result Date: 08/04/2020 Fluoroscopy was utilized by the requesting physician.  No radiographic interpretation.   DG HIP OPERATIVE UNILAT W OR W/O PELVIS LEFT  Result Date: 08/04/2020 CLINICAL  DATA:  Left femoral IM nail. EXAM: OPERATIVE LEFT HIP (WITH PELVIS IF PERFORMED) TECHNIQUE: Fluoroscopic spot image(s) were submitted for interpretation post-operatively. COMPARISON:  Preoperative hip radiograph yesterday. FINDINGS: Four fluoroscopic spot views obtained in the operating room in frontal and lateral projections. Intramedullary nail with trans trochanteric and distal locking screws traverses intertrochanteric femur fracture. Improved alignment from preoperative imaging. Knee arthroplasty is partially included. Fluoroscopy time 1 minutes 14 seconds. Dose 19.1 mGy. IMPRESSION: Fluoroscopic spot views after ORIF left proximal femur fracture. Electronically Signed   By: Keith Rake M.D.   On: 08/04/2020 20:31   DG Hip Unilat With Pelvis 2-3 Views Left  Result Date: 08/03/2020 CLINICAL DATA:  Fall, leg shortening EXAM: DG HIP (WITH OR WITHOUT PELVIS) 2-3V LEFT COMPARISON:  Radiograph 07/02/2020 FINDINGS: Comminuted fracture the proximal left femur with intertrochanteric and subtrochanteric fracture lines. Slight varus angulation and minimal foreshortening. Femoral head remains normally located. No other acute fracture or traumatic osseous injury of the pelvis. Degenerative changes in the lower spine, SI joints and bilateral hips are mild-to-moderate. No suspicious lytic or blastic lesions. Left hip swelling is present. Remaining soft tissues are unremarkable. IMPRESSION: Comminuted fracture of the proximal left femur with intertrochanteric and subtrochanteric fracture lines. Slight foreshortening and varus angulation across the fracture. Electronically Signed   By: Lovena Le M.D.   On: 08/03/2020 19:58    Assessment/Plan  1. Anemia due to acute blood loss Lab Results  Component Value Date   HGB 9.0 (L) 08/07/2020   -  Will monitor  2. Essential hypertension -  BPs  stable, continue  Amlodipine  3. Stage 3a chronic kidney disease [facility]) Lab Results  Component Value Date   NA 138  08/07/2020   K 3.8 08/07/2020   CO2 26 08/07/2020   GLUCOSE 103 (H) 08/07/2020   BUN 21 08/07/2020   CREATININE 0.93 08/07/2020   CALCIUM 8.4 (L) 08/07/2020   GFRNONAA >60 08/07/2020   GFRAA 56 (L) 07/17/2017   -   Will monitor  4. ILD (interstitial lung disease) ([place]) -  O2 was discontinued, no SOB -  Continue Mucus Relief ER, Spiriva and Fluticasone  5.  Diarrhea due to drug -   Will discontinue Miralax and Senexon-S       Family/ staff Communication: Discussed plan of care with resident and charge nurse.  Labs/tests ordered:  CBC and BMP  Goals of care:   Short-term care   Durenda Age, DNP, MSN, FNP-BC [facility] [phone] (Monday-Friday 8:00 a.m. - 5:00 p.m.) [phone] (after hours)

## 2020-08-15 ENCOUNTER — Ambulatory Visit (INDEPENDENT_AMBULATORY_CARE_PROVIDER_SITE_OTHER): Payer: PPO | Admitting: Orthopedic Surgery

## 2020-08-15 ENCOUNTER — Ambulatory Visit (INDEPENDENT_AMBULATORY_CARE_PROVIDER_SITE_OTHER): Payer: PPO

## 2020-08-15 DIAGNOSIS — G8918 Other acute postprocedural pain: Secondary | ICD-10-CM

## 2020-08-16 ENCOUNTER — Encounter: Payer: Self-pay | Admitting: Orthopedic Surgery

## 2020-08-16 NOTE — Progress Notes (Signed)
Post-Op Visit Note   Patient: Jacqueline Burke           Date of Birth: 1935-06-21           MRN: [id number] Visit Date: 08/15/2020 PCP: Glenis Smoker, MD   Assessment & Plan:  Chief Complaint:  Chief Complaint  Patient presents with  . Left Hip - Routine Post Op   Visit Diagnoses:  1. Post-op pain     Plan: Patient presents now 10 days out left intramedullary nail for reverse obliquity intertrochanteric fracture.  Patient also had cable fixation of the proximal part of the fracture.  On exam she can tolerate an axial load of about 50 pounds.  No real pain with internal or external rotation of the leg.  Sutures have been removed.  Radiographs look good.  Plan at this time is partial weightbearing with 2-week return and repeat radiographs to include the entire femur.  If those radiographs look reasonable with no hardware complications then we can proceed with weightbearing as tolerated to facilitate her discharge from the rehab facility.  No calf tenderness negative Homans today.  Follow-Up Instructions: Return in about 2 weeks (around 08/29/2020).   Orders:  Orders Placed This Encounter  Procedures  . XR HIP UNILAT W OR W/O PELVIS 2-3 VIEWS LEFT   No orders of the defined types were placed in this encounter.   Imaging: XR HIP UNILAT W OR W/O PELVIS 2-3 VIEWS LEFT  Result Date: 08/16/2020 AP pelvis lateral left hip reviewed.  Intramedullary hip screw traverses reverse obliquity intertrochanteric fracture.  Lesser trochanter remains relatively well aligned with the shaft proximally of the femur.  No hardware complications.  Bottom of the nail is not visualized.   PMFS History: Patient Active Problem List   Diagnosis Date Noted  . Normochromic normocytic anemia 08/09/2020  . Essential hypertension 08/03/2020  . CKD (chronic kidney disease), stage III ([place]) 08/03/2020  . Closed intertrochanteric fracture of hip, left, initial encounter ([place]) 08/03/2020  . Pulmonary  fibrosis ([place]) 08/03/2020  . Cough 03/20/2020  . CAD (coronary artery disease), native coronary artery 08/25/2017  . Hyperlipidemia LDL goal <70 08/25/2017  . NSTEMI (non-ST elevated myocardial infarction) ([place]) 06/12/2017  . Chronic chest pain   . Ureteral calculus 06/09/2017  . Edema extremities 02/16/2014  . Sinusitis, chronic 11/11/2013  . Dyspnea 11/11/2013  . PVC's (premature ventricular contractions) 08/19/2013  . Benign hypertensive heart disease without heart failure 08/15/2013   Past Medical History:  Diagnosis Date  . Allergic rhinitis   . CAD (coronary artery disease), native coronary artery    NSTEMI with 95% RCA s/p DES to RCA 06/2017  . Diverticulosis   . GERD (gastroesophageal reflux disease)   . Heart murmur    Systolic heart murmur with Aortic valve sclerosis by ECHO  . History of kidney stones   . Hyperlipidemia   . Hypertension   . Osteoarthritis    Erosive OA-MRI of R hand negative for synovitis, only OA-Dr Trudie Reed  . Osteopenia   . Pelvic prolapse   . PVC's (premature ventricular contractions)     Family History  Problem Relation Age of Onset  . Stroke Mother   . Diabetes Mother   . Heart attack Mother   . Osteoporosis Father   . Breast cancer Cousin     Past Surgical History:  Procedure Laterality Date  . BUNIONECTOMY  08/2006   R foot and hammer roe right second toe  . CARDIAC CATHETERIZATION    .  CARPAL TUNNEL RELEASE  2002   bil hands  . CORONARY STENT INTERVENTION N/A 06/15/2017   Procedure: CORONARY STENT INTERVENTION;  Surgeon: Lorretta Harp, MD;  Location: [place] [facility];  Service: Cardiovascular;  Laterality: N/A;  . CYSTOSCOPY     with laser lithotripsy Dr. Gloriann Loan 07-20-17   . CYSTOSCOPY W/ URETERAL STENT PLACEMENT Left 06/09/2017   Procedure: CYSTOSCOPY WITH RETROGRADE PYELOGRAM/URETERAL STENT PLACEMENT;  Surgeon: Lucas Mallow, MD;  [facility];  Service: Urology;  Laterality: Left;  . CYSTOSCOPY WITH RETROGRADE PYELOGRAM,  URETEROSCOPY AND STENT PLACEMENT Left 07/20/2017   Procedure: CYSTOSCOPY WITH RETROGRADE PYELOGRAM, LEFT URETEROSCOPY HOLMIUM LASER LITHO  AND STENT EXCHANGE;  Surgeon: Lucas Mallow, MD;  [facility];  Service: Urology;  Laterality: Left;  . HOLMIUM LASER APPLICATION Left 1/32/4401   Procedure: HOLMIUM LASER APPLICATION;  Surgeon: Lucas Mallow, MD;  [facility];  Service: Urology;  Laterality: Left;  . INTRAMEDULLARY (IM) NAIL INTERTROCHANTERIC Left 08/04/2020   Procedure: INTRAMEDULLARY (IM) NAIL INTERTROCHANTRIC WITH CABLES;  Surgeon: Meredith Pel, MD;  [facility];  Service: Orthopedics;  Laterality: Left;  . LEFT HEART CATH AND CORONARY ANGIOGRAPHY N/A 06/15/2017   Procedure: LEFT HEART CATH AND CORONARY ANGIOGRAPHY;  Surgeon: Lorretta Harp, MD;  Location: [place] [facility];  Service: Cardiovascular;  Laterality: N/A;  . TOTAL KNEE ARTHROPLASTY Left 08/2007  . TOTAL KNEE ARTHROPLASTY Right 03/2010   Social History   Occupational History  . Occupation: Retired  Tobacco Use  . Smoking status: Former Smoker    Packs/day: 1.00    Years: 25.00    Pack years: 25.00    Types: Cigarettes    Quit date: 06/02/1993    Years since quitting: 27.2  . Smokeless tobacco: Never Used  Vaping Use  . Vaping Use: Never used  Substance and Sexual Activity  . Alcohol use: No  . Drug use: No  . Sexual activity: Not Currently

## 2020-08-17 ENCOUNTER — Telehealth: Payer: Self-pay | Admitting: Adult Health

## 2020-08-17 LAB — CBC: RBC: 3.29 — AB (ref 3.87–5.11)

## 2020-08-17 LAB — CBC AND DIFFERENTIAL
HCT: 30 — AB (ref 36–46)
Hemoglobin: 10.3 — AB (ref 12.0–16.0)
Platelets: 592 — AB (ref 150–399)
WBC: 11.3

## 2020-08-17 NOTE — Telephone Encounter (Signed)
Nurse called to report results of CBC. WBC 11.1 Plt 592 Hgb 10.3  Pt is not having any acute complaints.  Vitals 97.7 HR 75 BP 129/67 No new orders given Staff instructed to call back with acute concerns.

## 2020-08-20 ENCOUNTER — Encounter: Payer: Self-pay | Admitting: Internal Medicine

## 2020-08-20 ENCOUNTER — Non-Acute Institutional Stay (SKILLED_NURSING_FACILITY): Payer: PPO | Admitting: Internal Medicine

## 2020-08-20 DIAGNOSIS — J8410 Pulmonary fibrosis, unspecified: Secondary | ICD-10-CM | POA: Diagnosis not present

## 2020-08-20 DIAGNOSIS — B9620 Unspecified Escherichia coli [E. coli] as the cause of diseases classified elsewhere: Secondary | ICD-10-CM | POA: Diagnosis not present

## 2020-08-20 DIAGNOSIS — N390 Urinary tract infection, site not specified: Secondary | ICD-10-CM

## 2020-08-20 NOTE — Progress Notes (Signed)
NURSING HOME LOCATION:  [facility] ROOM NUMBER:  [id number]  CODE STATUS: Full code  PCP: Sela Hilding, MD  This is a nursing facility follow up visit for specific acute issue of abnormal labs.  Interim medical record and care since last SNF visit was updated with review of diagnostic studies and change in clinical status since last visit were documented.  HPI: On 3/17 urinalysis and culture and sensitivity were ordered;she can not tell me why. Other than intermittent flank pain which she attributes to being bedbound; she denies any GU or constitutional symptoms.  When she first entered the facility she was having frequency but this has improved. The urine did reveal 11-20 white blood cells with 1+ bacteria and 250 leukocyte esterase.  Culture and sensitivity revealed greater than 100,000 E. coli ,uniformly sensitive.  It is to be noted that she is allergic to Cipro which caused hives.  She denies allergy to nitrofurantoin or trimethoprim/sulfamethoxazole. Also a chest x-ray was ordered for "coughing up mucus and blood".  She states that she does have almost daily chronic cough with some yellow phlegm but no hemoptysis.  She states this has been stable over the last 3 months.  The mobile imaging 3/16 revealed no acute pulmonary abnormality.  Her inpatient chest x-ray 3/5 was reviewed; this revealed chronic stable interstitial changes. Today she states she feels "great".  Review of systems: She is an excellent historian, intelligent and communicative. Constitutional: No fever, significant weight change  Eyes: No redness, discharge, pain, vision change ENT/mouth: No nasal congestion,  purulent discharge, earache, change in hearing, sore throat  Cardiovascular: No chest pain, palpitations, paroxysmal nocturnal dyspnea,  edema  Respiratory: No  hemoptysis, DOE, significant snoring, apnea   Gastrointestinal: No heartburn, dysphagia, abdominal pain, nausea /vomiting,  rectal bleeding, melena, change in bowels Genitourinary: No dysuria, hematuria, pyuria, incontinence, nocturia Musculoskeletal: No joint stiffness, joint swelling, weakness Dermatologic: No rash, pruritus, change in appearance of skin Neurologic: No dizziness, headache, syncope, seizures, numbness, tingling Psychiatric: No significant anxiety, depression, insomnia, anorexia Endocrine: No change in hair/skin/nails, excessive thirst, excessive hunger, excessive urination  Hematologic/lymphatic: No significant bruising, lymphadenopathy, abnormal bleeding Allergy/immunology: No itchy/watery eyes, significant sneezing, urticaria, angioedema  Physical exam:  Pertinent or positive findings: There are low-grade rales at the bases but the chest is otherwise clear.  Pedal pulses are decreased.  There is a resolving bruise over the left upper lateral calf.  Jacqueline Burke' sign is negative.  Well-healed scars present over the knees.  General appearance: Adequately nourished; no acute distress, increased work of breathing is present.   Lymphatic: No lymphadenopathy about the head, neck, axilla. Eyes: No conjunctival inflammation or lid edema is present. There is no scleral icterus. Ears:  External ear exam shows no significant lesions or deformities.   Nose:  External nasal examination shows no deformity or inflammation. Nasal mucosa are pink and moist without lesions, exudates Oral exam:  Lips and gums are healthy appearing. There is no oropharyngeal erythema or exudate. Neck:  No thyromegaly, masses, tenderness noted.    Heart:  Normal rate and regular rhythm. S1 and S2 normal without gallop, murmur, click, rub .  Lungs:  without wheezes, rhonchi,  rubs. Abdomen: Bowel sounds are normal. Abdomen is soft and nontender with no organomegaly, hernias, masses. GU: Deferred  Extremities:  No cyanosis, clubbing, edema  Neurologic exam :Balance, Rhomberg, finger to nose testing could not be completed due to clinical  state Skin: Warm & dry w/o tenting. No significant  rash.  See summary under each active problem in the Problem List with associated updated therapeutic plan

## 2020-08-20 NOTE — Patient Instructions (Signed)
See assessment and plan under each diagnosis in the problem list and acutely for this visit 

## 2020-08-20 NOTE — Assessment & Plan Note (Addendum)
Mobile imaging was completed at the [facility] 08/15/2020 for alledged "coughing up blood and mucus". This revealed no active process.  Last inpatient film 3/5 revealed chronic, stable interstitial lung changes. She denies any hemoptysis and states that her productive cough has been daily but stable over the last several months.

## 2020-08-27 ENCOUNTER — Encounter: Payer: Self-pay | Admitting: Adult Health

## 2020-08-27 ENCOUNTER — Non-Acute Institutional Stay (SKILLED_NURSING_FACILITY): Payer: PPO | Admitting: Adult Health

## 2020-08-27 DIAGNOSIS — J8410 Pulmonary fibrosis, unspecified: Secondary | ICD-10-CM

## 2020-08-27 DIAGNOSIS — B9620 Unspecified Escherichia coli [E. coli] as the cause of diseases classified elsewhere: Secondary | ICD-10-CM

## 2020-08-27 DIAGNOSIS — N390 Urinary tract infection, site not specified: Secondary | ICD-10-CM | POA: Diagnosis not present

## 2020-08-27 DIAGNOSIS — S72142S Displaced intertrochanteric fracture of left femur, sequela: Secondary | ICD-10-CM

## 2020-08-27 DIAGNOSIS — I10 Essential (primary) hypertension: Secondary | ICD-10-CM | POA: Diagnosis not present

## 2020-08-27 NOTE — Progress Notes (Signed)
Location:  [place] Room Number: 124 Place of Service:  SNF (31) Provider:  Durenda Age, DNP, FNP-BC  Patient Care Team: Hendricks Limes, MD as PCP - General (Internal Medicine) Sueanne Margarita, MD as PCP - Cardiology (Cardiology)  Extended Emergency Contact Information Primary Emergency Contact: Paulino Door Address: [address]          [place], [place] [address] Johnnette Litter [place] Phone: [phone] Relation: Spouse  Code Status:  Full Code  Goals of care: Advanced Directive information Advanced Directives 08/27/2020  Does Patient Have a Medical Advance Directive? No  Type of Advance Directive -  Does patient want to make changes to medical advance directive? -  Copy of [place] in Chart? -  Would patient like information on creating a medical advance directive? -     Chief Complaint  Patient presents with  . Medical Management of Chronic Issues  . Acute Visit    Short-term rehabilitation visit    HPI:  Pt is a 85 y.o. female seen today for medical management of chronic diseases.  She has a PMH  of hypertension, PVCs, ASCAD S/P NSTEMI in setting of acute respiratory failure and E. coli bacteremia in January 2019, pulmonary fibrosis and chronic kidney disease is stage III.  She is currently having PT and OT S/P intramedullary nailing on 08/04/20 after sustaining a left hip fracture from a fall at home.  She stated that her left hip pain is stable and takes 1-2 times/day PRN Norco. SBPs ranging from 192 133, with outlier 152.  She takes metoprolol tartrate 25 mg twice a day  and amlodipine 2.5 mg daily for hypertension.  She is currently taking nitrofurantoin for E. coli UTI.   Past Medical History:  Diagnosis Date  . Allergic rhinitis   . CAD (coronary artery disease), native coronary artery    NSTEMI with 95% RCA s/p DES to RCA 06/2017  . Diverticulosis   . GERD (gastroesophageal reflux disease)   .  Heart murmur    Systolic heart murmur with Aortic valve sclerosis by ECHO  . History of kidney stones   . Hyperlipidemia   . Hypertension   . Osteoarthritis    Erosive OA-MRI of R hand negative for synovitis, only OA-Dr Trudie Reed  . Osteopenia   . Pelvic prolapse   . PVC's (premature ventricular contractions)    Past Surgical History:  Procedure Laterality Date  . BUNIONECTOMY  08/2006   R foot and hammer roe right second toe  . CARDIAC CATHETERIZATION    . CARPAL TUNNEL RELEASE  2002   bil hands  . CORONARY STENT INTERVENTION N/A 06/15/2017   Procedure: CORONARY STENT INTERVENTION;  Surgeon: Lorretta Harp, MD;  Location: [facility];  Service: Cardiovascular;  Laterality: N/A;  . CYSTOSCOPY     with laser lithotripsy Dr. Gloriann Loan 07-20-17   . CYSTOSCOPY W/ URETERAL STENT PLACEMENT Left 06/09/2017   Procedure: CYSTOSCOPY WITH RETROGRADE PYELOGRAM/URETERAL STENT PLACEMENT;  Surgeon: Lucas Mallow, MD;  Location: [facility];  Service: Urology;  Laterality: Left;  . CYSTOSCOPY WITH RETROGRADE PYELOGRAM, URETEROSCOPY AND STENT PLACEMENT Left 07/20/2017   Procedure: CYSTOSCOPY WITH RETROGRADE PYELOGRAM, LEFT URETEROSCOPY HOLMIUM LASER LITHO  AND STENT EXCHANGE;  Surgeon: Lucas Mallow, MD;  Location: [facility];  Service: Urology;  Laterality: Left;  . HOLMIUM LASER APPLICATION Left 1/88/4166   Procedure: HOLMIUM LASER APPLICATION;  Surgeon: Lucas Mallow, MD;  Location:  [facility];  Service: Urology;  Laterality: Left;  . INTRAMEDULLARY (IM) NAIL INTERTROCHANTERIC Left 08/04/2020   Procedure: INTRAMEDULLARY (IM) NAIL INTERTROCHANTRIC WITH CABLES;  Surgeon: Meredith Pel, MD;  Location: [facility];  Service: Orthopedics;  Laterality: Left;  . LEFT HEART CATH AND CORONARY ANGIOGRAPHY N/A 06/15/2017   Procedure: LEFT HEART CATH AND CORONARY ANGIOGRAPHY;  Surgeon: Lorretta Harp, MD;  Location: [facility];  Service: Cardiovascular;  Laterality: N/A;  . TOTAL KNEE ARTHROPLASTY Left  08/2007  . TOTAL KNEE ARTHROPLASTY Right 03/2010    Allergies  Allergen Reactions  . Ciprofloxacin Hives  . Codeine Hives  . Demerol [Meperidine] Hives  . Hydrogen Peroxide Other (See Comments)    White blisters  . Propoxyphene Other (See Comments)    Unknown Other reaction(s): OTHER  . Tramadol Other (See Comments)    "ITCHY ON THE INSIDE"    Outpatient Encounter Medications as of 08/27/2020  Medication Sig  . acetaminophen (TYLENOL) 325 MG tablet Take 650 mg by mouth every 8 (eight) hours as needed.  . Albuterol Sulfate (PROAIR RESPICLICK) 604 (90 Base) MCG/ACT AEPB Inhale 1 puff into the lungs daily as needed (wheezing).  Marland Kitchen amitriptyline (ELAVIL) 10 MG tablet Take 20 mg by mouth at bedtime.   Marland Kitchen amLODipine (NORVASC) 2.5 MG tablet Take 1 tablet (2.5 mg total) by mouth daily.  Marland Kitchen aspirin EC 81 MG tablet Take 81 mg by mouth daily. On hold for procedure  . atorvastatin (LIPITOR) 80 MG tablet Take 1 tablet (80 mg total) by mouth daily at 6 PM.  . bisacodyl (DULCOLAX) 10 MG suppository Place 10 mg rectally as needed for moderate constipation.  . cetirizine (ZYRTEC) 10 MG chewable tablet Chew 10 mg by mouth daily.  . Cholecalciferol (VITAMIN D PO) Take 3,000 Units by mouth daily.   . clopidogrel (PLAVIX) 75 MG tablet Take 1 tablet by mouth once daily  . docusate sodium (COLACE) 100 MG capsule Take 200 mg by mouth daily.  . Ensure (ENSURE) Take 237 mLs by mouth 2 (two) times daily between meals.  Marland Kitchen FIBER PO Take 2 tablets by mouth daily.  . fluticasone (FLONASE) 50 MCG/ACT nasal spray Place 2 sprays into both nostrils daily.  Marland Kitchen gabapentin (NEURONTIN) 300 MG capsule Take 600 mg by mouth daily.  Marland Kitchen guaiFENesin (MUCINEX) 600 MG 12 hr tablet Take 1 tablet (600 mg total) by mouth 2 (two) times daily.  Marland Kitchen HYDROcodone-acetaminophen (NORCO/VICODIN) 5-325 MG tablet Take 1 tablet by mouth every 6 (six) hours as needed for severe pain.  . Magnesium Hydroxide (MILK OF MAGNESIA PO) Take 30 mLs by mouth.  As needed for constipation  . metoprolol tartrate (LOPRESSOR) 25 MG tablet Take 1 tablet by mouth twice daily  . Multiple Vitamin (MULTIVITAMIN WITH MINERALS) TABS tablet Take 1 tablet by mouth daily.  . nitrofurantoin (MACRODANTIN) 100 MG capsule Take 100 mg by mouth 2 (two) times daily.  Marland Kitchen tiotropium (SPIRIVA HANDIHALER) 18 MCG inhalation capsule Place 1 capsule (18 mcg total) into inhaler and inhale daily.  . Trospium Chloride 60 MG CP24 Take 60 mg by mouth daily.  . [DISCONTINUED] Ascorbic Acid (VITAMIN C PO) Take 1,000 mg by mouth daily.   . [DISCONTINUED] polyethylene glycol (MIRALAX / GLYCOLAX) 17 g packet Take 17 g by mouth daily.  . [DISCONTINUED] senna-docusate (SENOKOT-S) 8.6-50 MG tablet Take 2 tablets by mouth at bedtime.   No facility-administered encounter medications on file as of 08/27/2020.    Review of Systems  GENERAL: No change  in appetite, no fatigue, no weight changes, no fever, chills or weakness MOUTH and THROAT: Denies oral discomfort, gingival pain or bleeding RESPIRATORY: no cough, SOB, DOE, wheezing, hemoptysis CARDIAC: No chest pain, edema or palpitations GI: No abdominal pain, diarrhea, constipation, heart burn, nausea or vomiting GU: Denies dysuria, frequency, hematuria, incontinence, or discharge NEUROLOGICAL: Denies dizziness, syncope, numbness, or headache PSYCHIATRIC: Denies feelings of depression or anxiety. No report of hallucinations, insomnia, paranoia, or agitation   Immunization History  Administered Date(s) Administered  . Influenza Split 02/10/2008, 02/12/2009, 03/03/2011, 02/14/2013, 02/10/2018, 01/18/2019, 02/22/2020  . Influenza, High Dose Seasonal PF 03/26/2013  . Influenza,inj,Quad PF,6+ Mos 02/21/2016, 03/26/2017  . Influenza,inj,quad, With Preservative 04/11/2014, 03/22/2015  . PFIZER(Purple Top)SARS-COV-2 Vaccination 07/24/2019, 08/16/2019, 03/09/2020, 03/15/2020  . Pneumococcal Conjugate-13 08/04/2013, 02/22/2020  . Pneumococcal  Polysaccharide-23 05/12/2003, 06/14/2009  . Td 02/10/2008  . Tdap 03/28/2019  . Zoster 11/14/2005, 05/03/2018  . Zoster Recombinat (Shingrix) 05/03/2018, 07/30/2018   Pertinent  Health Maintenance Due  Topic Date Due  . INFLUENZA VACCINE  Completed  . DEXA SCAN  Completed  . PNA vac Low Risk Adult  Completed   Fall Risk  01/28/2018  Falls in the past year? No     Vitals:   08/27/20 1447  BP: 119/73  Pulse: 81  Resp: 20  Temp: 97.7 F (36.5 C)  Weight: 146 lb 12.8 oz (66.6 kg)  Height: 5\' 1"  (1.549 m)   Body mass index is 27.74 kg/m.  Physical Exam  GENERAL APPEARANCE: Well nourished. In no acute distress. Obese SKIN:  Left thigh surgical incisions dry, no erythema MOUTH and THROAT: Lips are without lesions. Oral mucosa is moist and without lesions. Tongue is normal in shape, size, and color and without lesions RESPIRATORY: Breathing is even & unlabored, BS CTAB CARDIAC: RRR, no murmur,no extra heart sounds, no edema GI: Abdomen soft, normal BS, no masses, no tenderness NEUROLOGICAL: There is no tremor. Speech is clear. lert and oriented X 3.  PSYCHIATRIC: AAffect and behavior are appropriate  Labs reviewed: Recent Labs    08/05/20 0401 08/06/20 0438 08/07/20 0435  NA 139 139 138  K 4.1 4.4 3.8  CL 107 105 104  CO2 22 27 26   GLUCOSE 175* 108* 103*  BUN 29* 22 21  CREATININE 1.33* 1.07* 0.93  CALCIUM 8.0* 8.6* 8.4*   Recent Labs    03/22/20 0953  ALT 17   Recent Labs    08/03/20 2016 08/04/20 0429 08/05/20 0401 08/05/20 1159 08/06/20 0438 08/07/20 0435 08/17/20 0000  WBC 13.9*   < > 12.9*  --  11.6* 9.9 11.3  NEUTROABS 9.8*  --   --   --   --   --   --   HGB 11.4*   < > 7.6*   < > 9.2* 9.0* 10.3*  HCT 35.2*   < > 23.7*   < > 28.7* 27.0* 30*  MCV 94.6   < > 96.0  --  93.8 93.1  --   PLT 231   < > 143*  --  159 170 592*   < > = values in this interval not displayed.   Lab Results  Component Value Date   TSH 2.360 02/24/2018   Lab Results   Component Value Date   HGBA1C 6.1 (H) 08/04/2020   Lab Results  Component Value Date   CHOL 79 06/16/2017   HDL 23 (L) 06/16/2017   LDLCALC 33 06/16/2017   TRIG 116 06/16/2017   CHOLHDL 3.4 06/16/2017  Significant Diagnostic Results in last 30 days:  Pelvis Portable  Result Date: 08/04/2020 CLINICAL DATA:  Postop left hip fracture fixation. EXAM: PORTABLE PELVIS 1-2 VIEWS COMPARISON:  Preoperative radiograph yesterday. FINDINGS: Intramedullary nail with trans trochanteric screw fixation of proximal femur fracture. Fracture is in improved alignment compared to preoperative imaging. The distal aspect of the femoral nail is not included in the field of view. Recent postsurgical change includes air and edema in the soft tissues. IMPRESSION: ORIF of proximal femur fracture without immediate postoperative complication. Distal aspect of the femoral nail is not included in the field of view. Electronically Signed   By: Keith Rake M.D.   On: 08/04/2020 23:11   DG CHEST PORT 1 VIEW  Result Date: 08/04/2020 CLINICAL DATA:  Hypoxia EXAM: PORTABLE CHEST 1 VIEW COMPARISON:  02/08/2020 FINDINGS: Pulmonary insufflation has diminished slightly since prior examination though lung volumes are still normal. Superimposed interstitial thickening is again identified, compatible with chronic interstitial changes noted on prior CT examination of 03/22/2020. No superimposed focal pulmonary infiltrate or nodule. No pneumothorax or pleural effusion. Cardiac size within normal limits. Pulmonary vascularity is normal. IMPRESSION: Stable chronic interstitial changes. No radiographic evidence of acute cardiopulmonary disease. Electronically Signed   By: Fidela Salisbury MD   On: 08/04/2020 00:23   DG C-Arm 1-60 Min-No Report  Result Date: 08/04/2020 Fluoroscopy was utilized by the requesting physician.  No radiographic interpretation.   DG HIP OPERATIVE UNILAT W OR W/O PELVIS LEFT  Result Date: 08/04/2020 CLINICAL  DATA:  Left femoral IM nail. EXAM: OPERATIVE LEFT HIP (WITH PELVIS IF PERFORMED) TECHNIQUE: Fluoroscopic spot image(s) were submitted for interpretation post-operatively. COMPARISON:  Preoperative hip radiograph yesterday. FINDINGS: Four fluoroscopic spot views obtained in the operating room in frontal and lateral projections. Intramedullary nail with trans trochanteric and distal locking screws traverses intertrochanteric femur fracture. Improved alignment from preoperative imaging. Knee arthroplasty is partially included. Fluoroscopy time 1 minutes 14 seconds. Dose 19.1 mGy. IMPRESSION: Fluoroscopic spot views after ORIF left proximal femur fracture. Electronically Signed   By: Keith Rake M.D.   On: 08/04/2020 20:31   DG Hip Unilat With Pelvis 2-3 Views Left  Result Date: 08/03/2020 CLINICAL DATA:  Fall, leg shortening EXAM: DG HIP (WITH OR WITHOUT PELVIS) 2-3V LEFT COMPARISON:  Radiograph 07/02/2020 FINDINGS: Comminuted fracture the proximal left femur with intertrochanteric and subtrochanteric fracture lines. Slight varus angulation and minimal foreshortening. Femoral head remains normally located. No other acute fracture or traumatic osseous injury of the pelvis. Degenerative changes in the lower spine, SI joints and bilateral hips are mild-to-moderate. No suspicious lytic or blastic lesions. Left hip swelling is present. Remaining soft tissues are unremarkable. IMPRESSION: Comminuted fracture of the proximal left femur with intertrochanteric and subtrochanteric fracture lines. Slight foreshortening and varus angulation across the fracture. Electronically Signed   By: Lovena Le M.D.   On: 08/03/2020 19:58   XR HIP UNILAT W OR W/O PELVIS 2-3 VIEWS LEFT  Result Date: 08/16/2020 AP pelvis lateral left hip reviewed.  Intramedullary hip screw traverses reverse obliquity intertrochanteric fracture.  Lesser trochanter remains relatively well aligned with the shaft proximally of the femur.  No hardware  complications.  Bottom of the nail is not visualized.   Assessment/Plan  1. Essential hypertension -   BPs stable, continue amlodipine and metoprolol tartrate  2. Pulmonary fibrosis (HCC) -   No wheezing/SOB, continue mucus relief ER, PRN proair inhaler, Spiriva and cetirizine  3. Closed intertrochanteric fracture of left hip, sequela -  S/P intramedullary  nailing on 08/04/20 -    Will decrease Norco 5-325 mg from 1 tab every 6 hours PRN to 1 tab every 8 hours PRN -    Partial weightbearing to LLE -    Continue PT and OT, for therapeutic strengthening exercises -    Follow-up with orthopedics  4.  E. coli UTI -Continue nitrofurantoin for a total of 1 week    Family/ staff Communication: Discussed plan of care with resident and charge nurse.  Labs/tests ordered: None  Goals of care:   Short-term care   Durenda Age, DNP, MSN, FNP-BC [facility] [phone] (Monday-Friday 8:00 a.m. - 5:00 p.m.) [phone] (after hours)

## 2020-08-29 ENCOUNTER — Non-Acute Institutional Stay (SKILLED_NURSING_FACILITY): Payer: PPO | Admitting: Adult Health

## 2020-08-29 ENCOUNTER — Ambulatory Visit (INDEPENDENT_AMBULATORY_CARE_PROVIDER_SITE_OTHER): Payer: PPO

## 2020-08-29 ENCOUNTER — Other Ambulatory Visit: Payer: Self-pay

## 2020-08-29 ENCOUNTER — Ambulatory Visit: Payer: PPO | Admitting: Orthopedic Surgery

## 2020-08-29 ENCOUNTER — Encounter: Payer: Self-pay | Admitting: Orthopedic Surgery

## 2020-08-29 ENCOUNTER — Encounter: Payer: Self-pay | Admitting: Adult Health

## 2020-08-29 DIAGNOSIS — D62 Acute posthemorrhagic anemia: Secondary | ICD-10-CM

## 2020-08-29 DIAGNOSIS — S72142A Displaced intertrochanteric fracture of left femur, initial encounter for closed fracture: Secondary | ICD-10-CM

## 2020-08-29 DIAGNOSIS — G629 Polyneuropathy, unspecified: Secondary | ICD-10-CM

## 2020-08-29 DIAGNOSIS — J8410 Pulmonary fibrosis, unspecified: Secondary | ICD-10-CM

## 2020-08-29 DIAGNOSIS — I10 Essential (primary) hypertension: Secondary | ICD-10-CM | POA: Diagnosis not present

## 2020-08-29 DIAGNOSIS — I2510 Atherosclerotic heart disease of native coronary artery without angina pectoris: Secondary | ICD-10-CM | POA: Diagnosis not present

## 2020-08-29 DIAGNOSIS — S72142S Displaced intertrochanteric fracture of left femur, sequela: Secondary | ICD-10-CM

## 2020-08-29 DIAGNOSIS — N3281 Overactive bladder: Secondary | ICD-10-CM | POA: Diagnosis not present

## 2020-08-29 MED ORDER — ASPIRIN EC 81 MG PO TBEC: 81.0000 mg | DELAYED_RELEASE_TABLET | Freq: Every day | ORAL | 0 refills | Status: DC

## 2020-08-29 MED ORDER — FLUTICASONE PROPIONATE 50 MCG/ACT NA SUSP
2.0000 | Freq: Every day | NASAL | 0 refills | Status: DC
Start: 2020-08-29 — End: 2024-02-04

## 2020-08-29 MED ORDER — CLOPIDOGREL BISULFATE 75 MG PO TABS: 75.0000 mg | ORAL_TABLET | Freq: Every day | ORAL | 0 refills | Status: DC

## 2020-08-29 MED ORDER — PROAIR RESPICLICK 108 (90 BASE) MCG/ACT IN AEPB
1.0000 | INHALATION_SPRAY | Freq: Every day | RESPIRATORY_TRACT | 0 refills | Status: DC | PRN
Start: 2020-08-29 — End: 2023-10-23

## 2020-08-29 MED ORDER — HYDROCODONE-ACETAMINOPHEN 5-325 MG PO TABS: 1.0000 | ORAL_TABLET | Freq: Three times a day (TID) | ORAL | 0 refills | Status: DC | PRN

## 2020-08-29 MED ORDER — CETIRIZINE HCL 10 MG PO CHEW: 10.0000 mg | CHEWABLE_TABLET | Freq: Every day | ORAL | 0 refills | Status: AC

## 2020-08-29 MED ORDER — GUAIFENESIN ER 600 MG PO TB12: 600.0000 mg | ORAL_TABLET | Freq: Two times a day (BID) | ORAL | 0 refills | Status: DC

## 2020-08-29 MED ORDER — SPIRIVA HANDIHALER 18 MCG IN CAPS: 18.0000 ug | ORAL_CAPSULE | Freq: Every day | RESPIRATORY_TRACT | 30 refills | Status: DC

## 2020-08-29 MED ORDER — AMITRIPTYLINE HCL 10 MG PO TABS: 20.0000 mg | ORAL_TABLET | Freq: Every day | ORAL | 0 refills | Status: DC

## 2020-08-29 MED ORDER — FIBER 625 MG PO TABS: 2.0000 | ORAL_TABLET | Freq: Every day | ORAL | 0 refills | Status: DC

## 2020-08-29 MED ORDER — GABAPENTIN 300 MG PO CAPS: 600.0000 mg | ORAL_CAPSULE | Freq: Every day | ORAL | 0 refills | Status: DC

## 2020-08-29 MED ORDER — ATORVASTATIN CALCIUM 80 MG PO TABS: 80.0000 mg | ORAL_TABLET | Freq: Every day | ORAL | 0 refills | Status: DC

## 2020-08-29 MED ORDER — METOPROLOL TARTRATE 25 MG PO TABS: 25.0000 mg | ORAL_TABLET | Freq: Two times a day (BID) | ORAL | 0 refills | Status: DC

## 2020-08-29 MED ORDER — AMLODIPINE BESYLATE 2.5 MG PO TABS: 2.5000 mg | ORAL_TABLET | Freq: Every day | ORAL | 0 refills | Status: DC

## 2020-08-29 MED ORDER — TROSPIUM CHLORIDE ER 60 MG PO CP24: 60.0000 mg | ORAL_CAPSULE | Freq: Every day | ORAL | 0 refills | Status: DC

## 2020-08-29 NOTE — Progress Notes (Signed)
Location:  [place] Room Number: [place] of Service:  SNF (31) Provider:  Durenda Age, DNP, FNP-BC  Patient Care Team: Hendricks Limes, MD as PCP - General (Internal Medicine) Sueanne Margarita, MD as PCP - Cardiology (Cardiology)  Extended Emergency Contact Information Primary Emergency Contact: Paulino Door Address: [address]          [place], [place] [address] Johnnette Litter of [place] Phone: [phone] Relation: Spouse  Code Status:    Goals of care: Advanced Directive information Advanced Directives 08/27/2020  Does Patient Have a Medical Advance Directive? No  Type of Advance Directive -  Does patient want to make changes to medical advance directive? -  Copy of [place] in Chart? -  Would patient like information on creating a medical advance directive? -     Chief Complaint  Patient presents with  . Discharge Note    For discharge home on 08/29/20    HPI:  Pt is a 85 y.o. female who is for discharge home today with home health PT and OT.  She was admitted to [facility] on 08/07/20 post [facility] admission 08/03/20 to 08/07/20.  She has a PMH of hypertension, PVCs, ASCAD S/P NSTEMI in the setting of acute respiratory failure and E. coli bacteremia in 2019, pulmonary fibrosis, chronic kidney disease stage III and allergies.  She had a fall at home for which she sustained a left hip fracture.  Orthopedics was consulted and performed intramedullary nail of left hip on 08/04/20.  She had Foley catheter on the first day of admission and was discontinued on 3/06.  Hospital stay was complicated by urinary retention but now able to void on her own.  Her hemoglobin dropped to 7.6 and was transfused 1 unit of blood.   At [facility], stay was complicated by UTI for which she had nitrofurantoin x1 week.  Patient was admitted to this facility for short-term rehabilitation after the patient's recent  hospitalization.  Patient has completed SNF rehabilitation and therapy has cleared the patient for discharge.    Past Medical History:  Diagnosis Date  . Allergic rhinitis   . CAD (coronary artery disease), native coronary artery    NSTEMI with 95% RCA s/p DES to RCA 06/2017  . Diverticulosis   . GERD (gastroesophageal reflux disease)   . Heart murmur    Systolic heart murmur with Aortic valve sclerosis by ECHO  . History of kidney stones   . Hyperlipidemia   . Hypertension   . Osteoarthritis    Erosive OA-MRI of R hand negative for synovitis, only OA-Dr Trudie Reed  . Osteopenia   . Pelvic prolapse   . PVC's (premature ventricular contractions)    Past Surgical History:  Procedure Laterality Date  . BUNIONECTOMY  08/2006   R foot and hammer roe right second toe  . CARDIAC CATHETERIZATION    . CARPAL TUNNEL RELEASE  2002   bil hands  . CORONARY STENT INTERVENTION N/A 06/15/2017   Procedure: CORONARY STENT INTERVENTION;  Surgeon: Lorretta Harp, MD;  Location: [place] [facility];  Service: Cardiovascular;  Laterality: N/A;  . CYSTOSCOPY     with laser lithotripsy Dr. Gloriann Loan 07-20-17   . CYSTOSCOPY W/ URETERAL STENT PLACEMENT Left 06/09/2017   Procedure: CYSTOSCOPY WITH RETROGRADE PYELOGRAM/URETERAL STENT PLACEMENT;  Surgeon: Lucas Mallow, MD;  [facility];  Service: Urology;  Laterality: Left;  . CYSTOSCOPY WITH RETROGRADE PYELOGRAM, URETEROSCOPY AND STENT  PLACEMENT Left 07/20/2017   Procedure: CYSTOSCOPY WITH RETROGRADE PYELOGRAM, LEFT URETEROSCOPY HOLMIUM LASER LITHO  AND STENT EXCHANGE;  Surgeon: Lucas Mallow, MD;  [facility];  Service: Urology;  Laterality: Left;  . HOLMIUM LASER APPLICATION Left 12/08/6281   Procedure: HOLMIUM LASER APPLICATION;  Surgeon: Lucas Mallow, MD;  [facility];  Service: Urology;  Laterality: Left;  . INTRAMEDULLARY (IM) NAIL INTERTROCHANTERIC Left 08/04/2020   Procedure: INTRAMEDULLARY (IM) NAIL INTERTROCHANTRIC WITH CABLES;   Surgeon: Meredith Pel, MD;  [facility];  Service: Orthopedics;  Laterality: Left;  . LEFT HEART CATH AND CORONARY ANGIOGRAPHY N/A 06/15/2017   Procedure: LEFT HEART CATH AND CORONARY ANGIOGRAPHY;  Surgeon: Lorretta Harp, MD;  Location: [place];  Service: Cardiovascular;  Laterality: N/A;  . TOTAL KNEE ARTHROPLASTY Left 08/2007  . TOTAL KNEE ARTHROPLASTY Right 03/2010    Allergies  Allergen Reactions  . Ciprofloxacin Hives  . Codeine Hives  . Demerol [Meperidine] Hives  . Hydrogen Peroxide Other (See Comments)    White blisters  . Propoxyphene Other (See Comments)    Unknown Other reaction(s): OTHER  . Tramadol Other (See Comments)    "ITCHY ON THE INSIDE"    Outpatient Encounter Medications as of 08/29/2020  Medication Sig  . Calcium Polycarbophil (FIBER) 625 MG TABS Take 2 tablets (1,250 mg total) by mouth daily.  Marland Kitchen HYDROcodone-acetaminophen (NORCO) 5-325 MG tablet Take 1 tablet by mouth every 8 (eight) hours as needed for moderate pain.  Marland Kitchen acetaminophen (TYLENOL) 325 MG tablet Take 650 mg by mouth every 8 (eight) hours as needed.  . Albuterol Sulfate (PROAIR RESPICLICK) 662 (90 Base) MCG/ACT AEPB Inhale 1 puff into the lungs daily as needed (wheezing).  Marland Kitchen amitriptyline (ELAVIL) 10 MG tablet Take 2 tablets (20 mg total) by mouth at bedtime. Take 2 tabs = 20 mg by mouth at bedtime  . amLODipine (NORVASC) 2.5 MG tablet Take 1 tablet (2.5 mg total) by mouth daily.  Marland Kitchen aspirin EC 81 MG tablet Take 1 tablet (81 mg total) by mouth daily.  Marland Kitchen atorvastatin (LIPITOR) 80 MG tablet Take 1 tablet (80 mg total) by mouth daily at 6 PM.  . bisacodyl (DULCOLAX) 10 MG suppository Place 10 mg rectally as needed for moderate constipation.  . cetirizine (ZYRTEC) 10 MG chewable tablet Chew 1 tablet (10 mg total) by mouth daily.  . Cholecalciferol (VITAMIN D PO) Take 3,000 Units by mouth daily.   . clopidogrel (PLAVIX) 75 MG tablet Take 1 tablet (75 mg total) by mouth daily.  Marland Kitchen  docusate sodium (COLACE) 100 MG capsule Take 200 mg by mouth daily.  . Ensure (ENSURE) Take 237 mLs by mouth 2 (two) times daily between meals.  . fluticasone (FLONASE) 50 MCG/ACT nasal spray Place 2 sprays into both nostrils daily.  Marland Kitchen gabapentin (NEURONTIN) 300 MG capsule Take 2 capsules (600 mg total) by mouth daily.  Marland Kitchen guaiFENesin (MUCINEX) 600 MG 12 hr tablet Take 1 tablet (600 mg total) by mouth 2 (two) times daily.  . Magnesium Hydroxide (MILK OF MAGNESIA PO) Take 30 mLs by mouth. As needed for constipation  . metoprolol tartrate (LOPRESSOR) 25 MG tablet Take 1 tablet (25 mg total) by mouth 2 (two) times daily.  . Multiple Vitamin (MULTIVITAMIN WITH MINERALS) TABS tablet Take 1 tablet by mouth daily.  Marland Kitchen tiotropium (SPIRIVA HANDIHALER) 18 MCG inhalation capsule Place 1 capsule (18 mcg total) into inhaler and inhale daily.  . Trospium Chloride 60 MG CP24 Take  1 capsule (60 mg total) by mouth daily.  . [DISCONTINUED] Albuterol Sulfate (PROAIR RESPICLICK) 160 (90 Base) MCG/ACT AEPB Inhale 1 puff into the lungs daily as needed (wheezing).  . [DISCONTINUED] amitriptyline (ELAVIL) 10 MG tablet Take 20 mg by mouth at bedtime.   . [DISCONTINUED] amLODipine (NORVASC) 2.5 MG tablet Take 1 tablet (2.5 mg total) by mouth daily.  . [DISCONTINUED] aspirin EC 81 MG tablet Take 81 mg by mouth daily. On hold for procedure  . [DISCONTINUED] atorvastatin (LIPITOR) 80 MG tablet Take 1 tablet (80 mg total) by mouth daily at 6 PM.  . [DISCONTINUED] cetirizine (ZYRTEC) 10 MG chewable tablet Chew 10 mg by mouth daily.  . [DISCONTINUED] clopidogrel (PLAVIX) 75 MG tablet Take 1 tablet by mouth once daily  . [DISCONTINUED] FIBER PO Take 2 tablets by mouth daily.  . [DISCONTINUED] fluticasone (FLONASE) 50 MCG/ACT nasal spray Place 2 sprays into both nostrils daily.  . [DISCONTINUED] gabapentin (NEURONTIN) 300 MG capsule Take 600 mg by mouth daily.  . [DISCONTINUED] guaiFENesin (MUCINEX) 600 MG 12 hr tablet Take 1  tablet (600 mg total) by mouth 2 (two) times daily.  . [DISCONTINUED] HYDROcodone-acetaminophen (NORCO/VICODIN) 5-325 MG tablet Take 1 tablet by mouth every 6 (six) hours as needed for severe pain.  . [DISCONTINUED] metoprolol tartrate (LOPRESSOR) 25 MG tablet Take 1 tablet by mouth twice daily  . [DISCONTINUED] nitrofurantoin (MACRODANTIN) 100 MG capsule Take 100 mg by mouth 2 (two) times daily.  . [DISCONTINUED] tiotropium (SPIRIVA HANDIHALER) 18 MCG inhalation capsule Place 1 capsule (18 mcg total) into inhaler and inhale daily.  . [DISCONTINUED] Trospium Chloride 60 MG CP24 Take 60 mg by mouth daily.   No facility-administered encounter medications on file as of 08/29/2020.    Review of Systems  GENERAL: No change in appetite, no fatigue, no weight changes, no fever, chills or weakness MOUTH and THROAT: Denies oral discomfort, gingival pain or bleeding RESPIRATORY: no cough, SOB, DOE, wheezing, hemoptysis CARDIAC: No chest pain, edema or palpitations GI: No abdominal pain, diarrhea, constipation, heart burn, nausea or vomiting GU: Denies dysuria, frequency, hematuria, incontinence, or discharge NEUROLOGICAL: Denies dizziness, syncope, numbness, or headache PSYCHIATRIC: Denies feelings of depression or anxiety. No report of hallucinations, insomnia, paranoia, or agitation   Immunization History  Administered Date(s) Administered  . Influenza Split 02/10/2008, 02/12/2009, 03/03/2011, 02/14/2013, 02/10/2018, 01/18/2019, 02/22/2020  . Influenza, High Dose Seasonal PF 03/26/2013  . Influenza,inj,Quad PF,6+ Mos 02/21/2016, 03/26/2017  . Influenza,inj,quad, With Preservative 04/11/2014, 03/22/2015  . PFIZER(Purple Top)SARS-COV-2 Vaccination 07/24/2019, 08/16/2019, 03/09/2020, 03/15/2020  . Pneumococcal Conjugate-13 08/04/2013, 02/22/2020  . Pneumococcal Polysaccharide-23 05/12/2003, 06/14/2009  . Td 02/10/2008  . Tdap 03/28/2019  . Zoster 11/14/2005, 05/03/2018  . Zoster Recombinat  (Shingrix) 05/03/2018, 07/30/2018   Pertinent  Health Maintenance Due  Topic Date Due  . INFLUENZA VACCINE  Completed  . DEXA SCAN  Completed  . PNA vac Low Risk Adult  Completed   Fall Risk  01/28/2018  Falls in the past year? No     Vitals:   08/29/20 1247  BP: 133/64  Pulse: 85  Resp: 18  Temp: 98.6 F (37 C)  Weight: 148 lb 6.4 oz (67.3 kg)  Height: 5\' 1"  (1.549 m)   Body mass index is 28.04 kg/m.  Physical Exam  GENERAL APPEARANCE: Well nourished. In no acute distress. Obese SKIN:  Left thigh surgical wound healed MOUTH and THROAT: Lips are without lesions. Oral mucosa is moist and without lesions. Tongue is normal in shape, size, and color and  without lesions RESPIRATORY: Breathing is even & unlabored, BS CTAB CARDIAC: RRR, no murmur,no extra heart sounds GI: Abdomen soft, normal BS, no masses, no tenderness EXTREMITIES:  Able to move X 4 extremities NEUROLOGICAL: There is no tremor. Speech is clear. Alert and oriented X 3.  PSYCHIATRIC: Affect and behavior are appropriate  Labs reviewed: Recent Labs    08/05/20 0401 08/06/20 0438 08/07/20 0435  NA 139 139 138  K 4.1 4.4 3.8  CL 107 105 104  CO2 22 27 26   GLUCOSE 175* 108* 103*  BUN 29* 22 21  CREATININE 1.33* 1.07* 0.93  CALCIUM 8.0* 8.6* 8.4*   Recent Labs    03/22/20 0953  ALT 17   Recent Labs    08/03/20 2016 08/04/20 0429 08/05/20 0401 08/05/20 1159 08/06/20 0438 08/07/20 0435 08/17/20 0000  WBC 13.9*   < > 12.9*  --  11.6* 9.9 11.3  NEUTROABS 9.8*  --   --   --   --   --   --   HGB 11.4*   < > 7.6*   < > 9.2* 9.0* 10.3*  HCT 35.2*   < > 23.7*   < > 28.7* 27.0* 30*  MCV 94.6   < > 96.0  --  93.8 93.1  --   PLT 231   < > 143*  --  159 170 592*   < > = values in this interval not displayed.   Lab Results  Component Value Date   TSH 2.360 02/24/2018   Lab Results  Component Value Date   HGBA1C 6.1 (H) 08/04/2020   Lab Results  Component Value Date   CHOL 79 06/16/2017   HDL  23 (L) 06/16/2017   LDLCALC 33 06/16/2017   TRIG 116 06/16/2017   CHOLHDL 3.4 06/16/2017    Significant Diagnostic Results in last 30 days:  Pelvis Portable  Result Date: 08/04/2020 CLINICAL DATA:  Postop left hip fracture fixation. EXAM: PORTABLE PELVIS 1-2 VIEWS COMPARISON:  Preoperative radiograph yesterday. FINDINGS: Intramedullary nail with trans trochanteric screw fixation of proximal femur fracture. Fracture is in improved alignment compared to preoperative imaging. The distal aspect of the femoral nail is not included in the field of view. Recent postsurgical change includes air and edema in the soft tissues. IMPRESSION: ORIF of proximal femur fracture without immediate postoperative complication. Distal aspect of the femoral nail is not included in the field of view. Electronically Signed   By: Keith Rake M.D.   On: 08/04/2020 23:11   DG CHEST PORT 1 VIEW  Result Date: 08/04/2020 CLINICAL DATA:  Hypoxia EXAM: PORTABLE CHEST 1 VIEW COMPARISON:  02/08/2020 FINDINGS: Pulmonary insufflation has diminished slightly since prior examination though lung volumes are still normal. Superimposed interstitial thickening is again identified, compatible with chronic interstitial changes noted on prior CT examination of 03/22/2020. No superimposed focal pulmonary infiltrate or nodule. No pneumothorax or pleural effusion. Cardiac size within normal limits. Pulmonary vascularity is normal. IMPRESSION: Stable chronic interstitial changes. No radiographic evidence of acute cardiopulmonary disease. Electronically Signed   By: Fidela Salisbury MD   On: 08/04/2020 00:23   DG C-Arm 1-60 Min-No Report  Result Date: 08/04/2020 Fluoroscopy was utilized by the requesting physician.  No radiographic interpretation.   DG HIP OPERATIVE UNILAT W OR W/O PELVIS LEFT  Result Date: 08/04/2020 CLINICAL DATA:  Left femoral IM nail. EXAM: OPERATIVE LEFT HIP (WITH PELVIS IF PERFORMED) TECHNIQUE: Fluoroscopic spot image(s)  were submitted for interpretation post-operatively. COMPARISON:  Preoperative hip radiograph yesterday.  FINDINGS: Four fluoroscopic spot views obtained in the operating room in frontal and lateral projections. Intramedullary nail with trans trochanteric and distal locking screws traverses intertrochanteric femur fracture. Improved alignment from preoperative imaging. Knee arthroplasty is partially included. Fluoroscopy time 1 minutes 14 seconds. Dose 19.1 mGy. IMPRESSION: Fluoroscopic spot views after ORIF left proximal femur fracture. Electronically Signed   By: Keith Rake M.D.   On: 08/04/2020 20:31   DG Hip Unilat With Pelvis 2-3 Views Left  Result Date: 08/03/2020 CLINICAL DATA:  Fall, leg shortening EXAM: DG HIP (WITH OR WITHOUT PELVIS) 2-3V LEFT COMPARISON:  Radiograph 07/02/2020 FINDINGS: Comminuted fracture the proximal left femur with intertrochanteric and subtrochanteric fracture lines. Slight varus angulation and minimal foreshortening. Femoral head remains normally located. No other acute fracture or traumatic osseous injury of the pelvis. Degenerative changes in the lower spine, SI joints and bilateral hips are mild-to-moderate. No suspicious lytic or blastic lesions. Left hip swelling is present. Remaining soft tissues are unremarkable. IMPRESSION: Comminuted fracture of the proximal left femur with intertrochanteric and subtrochanteric fracture lines. Slight foreshortening and varus angulation across the fracture. Electronically Signed   By: Lovena Le M.D.   On: 08/03/2020 19:58   XR FEMUR MIN 2 VIEWS LEFT  Result Date: 08/29/2020 AP and lateral views of left femur reviewed.  Hardware in excellent position with no backing out of the screws.  Fracture remains in anatomic position with little callus formation at this point.  No interval fracture or displacement of the fracture site noted.  XR HIP UNILAT W OR W/O PELVIS 2-3 VIEWS LEFT  Result Date: 08/16/2020 AP pelvis lateral left  hip reviewed.  Intramedullary hip screw traverses reverse obliquity intertrochanteric fracture.  Lesser trochanter remains relatively well aligned with the shaft proximally of the femur.  No hardware complications.  Bottom of the nail is not visualized.   Assessment/Plan  1. Closed intertrochanteric fracture of left hip, sequela -   S/P intramedullary nailing left hip on 3/05 - HYDROcodone-acetaminophen (NORCO) 5-325 MG tablet; Take 1 tablet by mouth every 8 (eight) hours as needed for moderate pain.  Dispense: 15 tablet; Refill: 0 -   Weightbearing as tolerated with walker -    Follow-up with orthopedics in 3 weeks -    For home health PT and OT, for therapeutic and strengthening exercises -   Fall precautions  2. Pulmonary fibrosis (HCC) - Albuterol Sulfate (PROAIR RESPICLICK) 101 (90 Base) MCG/ACT AEPB; Inhale 1 puff into the lungs daily as needed (wheezing).  Dispense: 1 each; Refill: 0 - fluticasone (FLONASE) 50 MCG/ACT nasal spray; Place 2 sprays into both nostrils daily.  Dispense: 16 g; Refill: 0 - cetirizine (ZYRTEC) 10 MG chewable tablet; Chew 1 tablet (10 mg total) by mouth daily.  Dispense: 30 tablet; Refill: 0 - guaiFENesin (MUCINEX) 600 MG 12 hr tablet; Take 1 tablet (600 mg total) by mouth 2 (two) times daily.  Dispense: 60 tablet; Refill: 0 - tiotropium (SPIRIVA HANDIHALER) 18 MCG inhalation capsule; Place 1 capsule (18 mcg total) into inhaler and inhale daily.  Dispense: 30 capsule; Refill: 30  3. Essential hypertension - amLODipine (NORVASC) 2.5 MG tablet; Take 1 tablet (2.5 mg total) by mouth daily.  Dispense: 30 tablet; Refill: 0 - metoprolol tartrate (LOPRESSOR) 25 MG tablet; Take 1 tablet (25 mg total) by mouth 2 (two) times daily.  Dispense: 60 tablet; Refill: 0  4. Anemia due to acute blood loss Lab Results  Component Value Date   HGB 10.3 (A) 08/17/2020   -  S/P transfusion of 1 unit blood -  stable  5. Coronary artery disease involving native coronary artery  of native heart without angina pectoris - aspirin EC 81 MG tablet; Take 1 tablet (81 mg total) by mouth daily.  Dispense: 30 tablet; Refill: 0 - atorvastatin (LIPITOR) 80 MG tablet; Take 1 tablet (80 mg total) by mouth daily at 6 PM.  Dispense: 30 tablet; Refill: 0 - clopidogrel (PLAVIX) 75 MG tablet; Take 1 tablet (75 mg total) by mouth daily.  Dispense: 30 tablet; Refill: 0  6. OAB (overactive bladder) - Trospium Chloride 60 MG CP24; Take 1 capsule (60 mg total) by mouth daily.  Dispense: 30 capsule; Refill: 0  7. Neuropathy - amitriptyline (ELAVIL) 10 MG tablet; Take 2 tablets (20 mg total) by mouth at bedtime. Take 2 tabs = 20 mg by mouth at bedtime  Dispense: 60 tablet; Refill: 0 - gabapentin (NEURONTIN) 300 MG capsule; Take 2 capsules (600 mg total) by mouth daily.  Dispense: 60 capsule; Refill: 0      I have filled out patient's discharge paperwork and e-prescribed medicationss.  Patient will have home health PT and OT.  DME provided:  None  Total discharge time: Greater than 30 minutes Greater than 50% was spent in counseling and coordination of care.   Discharge time involved coordination of the discharge process with social worker, nursing staff and therapy department. Medical justification for home health services verified.    Durenda Age, DNP, MSN, FNP-BC [facility] [phone] (Monday-Friday 8:00 a.m. - 5:00 p.m.) [phone] (after hours)

## 2020-08-29 NOTE — Progress Notes (Signed)
Post-Op Visit Note   Patient: Jacqueline Burke           Date of Birth: 1936/06/02           MRN: [id number] Visit Date: 08/29/2020 PCP: Hendricks Limes, MD   Assessment & Plan:  Chief Complaint:  Chief Complaint  Patient presents with  . Left Leg - Routine Post Op   Visit Diagnoses:  1. Displaced intertrochanteric fracture of left femur, initial encounter for closed fracture [facility])     Plan: Patient is an 85 year old female presents s/p left intramedullary nail of left femur on 08/04/2020.  She is doing well.  She is getting physical therapy at her skilled nursing facility.  She is currently 50% weightbearing with a walker.  She states that pain is improving and she is trying to walk as much as she can with her walker.  Radiographs show fracture with little callus formation.  Fracture is anatomically aligned and hardware remains in excellent position with no change since prior radiographs.  Plan to progress patient to full weightbearing with a walker for balance.  Follow-up in 3 weeks for clinical recheck with new radiographs.  She has no significant pain with internal rotation or with axial loading of the hip on exam today.  No calf tenderness.  Negative Homans' sign.  Reports she is taking aspirin for DVT prophylaxis, continue with this.  Follow-Up Instructions: No follow-ups on file.   Orders:  Orders Placed This Encounter  Procedures  . XR FEMUR MIN 2 VIEWS LEFT   No orders of the defined types were placed in this encounter.   Imaging: No results found.  PMFS History: Patient Active Problem List   Diagnosis Date Noted  . Normochromic normocytic anemia 08/09/2020  . Essential hypertension 08/03/2020  . CKD (chronic kidney disease), stage III ([place]) 08/03/2020  . Closed intertrochanteric fracture of hip, left, initial encounter ([place]) 08/03/2020  . Pulmonary fibrosis ([place]) 08/03/2020  . Cough 03/20/2020  . CAD (coronary artery disease), native coronary artery 08/25/2017  .  Hyperlipidemia LDL goal <70 08/25/2017  . NSTEMI (non-ST elevated myocardial infarction) ([place]) 06/12/2017  . Chronic chest pain   . Ureteral calculus 06/09/2017  . Edema extremities 02/16/2014  . Sinusitis, chronic 11/11/2013  . Dyspnea 11/11/2013  . PVC's (premature ventricular contractions) 08/19/2013  . Benign hypertensive heart disease without heart failure 08/15/2013   Past Medical History:  Diagnosis Date  . Allergic rhinitis   . CAD (coronary artery disease), native coronary artery    NSTEMI with 95% RCA s/p DES to RCA 06/2017  . Diverticulosis   . GERD (gastroesophageal reflux disease)   . Heart murmur    Systolic heart murmur with Aortic valve sclerosis by ECHO  . History of kidney stones   . Hyperlipidemia   . Hypertension   . Osteoarthritis    Erosive OA-MRI of R hand negative for synovitis, only OA-Dr Trudie Reed  . Osteopenia   . Pelvic prolapse   . PVC's (premature ventricular contractions)     Family History  Problem Relation Age of Onset  . Stroke Mother   . Diabetes Mother   . Heart attack Mother   . Osteoporosis Father   . Breast cancer Cousin     Past Surgical History:  Procedure Laterality Date  . BUNIONECTOMY  08/2006   R foot and hammer roe right second toe  . CARDIAC CATHETERIZATION    . CARPAL TUNNEL RELEASE  2002   bil hands  . CORONARY STENT  INTERVENTION N/A 06/15/2017   Procedure: CORONARY STENT INTERVENTION;  Surgeon: Lorretta Harp, MD;  Location: [facility];  Service: Cardiovascular;  Laterality: N/A;  . CYSTOSCOPY     with laser lithotripsy Dr. Gloriann Loan 07-20-17   . CYSTOSCOPY W/ URETERAL STENT PLACEMENT Left 06/09/2017   Procedure: CYSTOSCOPY WITH RETROGRADE PYELOGRAM/URETERAL STENT PLACEMENT;  Surgeon: Lucas Mallow, MD;  [facility];  Service: Urology;  Laterality: Left;  . CYSTOSCOPY WITH RETROGRADE PYELOGRAM, URETEROSCOPY AND STENT PLACEMENT Left 07/20/2017   Procedure: CYSTOSCOPY WITH RETROGRADE PYELOGRAM, LEFT URETEROSCOPY  HOLMIUM LASER LITHO  AND STENT EXCHANGE;  Surgeon: Lucas Mallow, MD;  [facility];  Service: Urology;  Laterality: Left;  . HOLMIUM LASER APPLICATION Left 1/74/9449   Procedure: HOLMIUM LASER APPLICATION;  Surgeon: Lucas Mallow, MD;  [facility];  Service: Urology;  Laterality: Left;  . INTRAMEDULLARY (IM) NAIL INTERTROCHANTERIC Left 08/04/2020   Procedure: INTRAMEDULLARY (IM) NAIL INTERTROCHANTRIC WITH CABLES;  Surgeon: Meredith Pel, MD;  [facility];  Service: Orthopedics;  Laterality: Left;  . LEFT HEART CATH AND CORONARY ANGIOGRAPHY N/A 06/15/2017   Procedure: LEFT HEART CATH AND CORONARY ANGIOGRAPHY;  Surgeon: Lorretta Harp, MD;  Location: [facility];  Service: Cardiovascular;  Laterality: N/A;  . TOTAL KNEE ARTHROPLASTY Left 08/2007  . TOTAL KNEE ARTHROPLASTY Right 03/2010   Social History   Occupational History  . Occupation: Retired  Tobacco Use  . Smoking status: Former Smoker    Packs/day: 1.00    Years: 25.00    Pack years: 25.00    Types: Cigarettes    Quit date: 06/02/1993    Years since quitting: 27.2  . Smokeless tobacco: Never Used  Vaping Use  . Vaping Use: Never used  Substance and Sexual Activity  . Alcohol use: No  . Drug use: No  . Sexual activity: Not Currently

## 2020-08-30 DIAGNOSIS — J309 Allergic rhinitis, unspecified: Secondary | ICD-10-CM | POA: Diagnosis not present

## 2020-08-30 DIAGNOSIS — J8410 Pulmonary fibrosis, unspecified: Secondary | ICD-10-CM | POA: Diagnosis not present

## 2020-08-30 DIAGNOSIS — M1990 Unspecified osteoarthritis, unspecified site: Secondary | ICD-10-CM | POA: Diagnosis not present

## 2020-08-30 DIAGNOSIS — W010XXD Fall on same level from slipping, tripping and stumbling without subsequent striking against object, subsequent encounter: Secondary | ICD-10-CM | POA: Diagnosis not present

## 2020-08-30 DIAGNOSIS — S72142D Displaced intertrochanteric fracture of left femur, subsequent encounter for closed fracture with routine healing: Secondary | ICD-10-CM | POA: Diagnosis not present

## 2020-08-30 DIAGNOSIS — M8580 Other specified disorders of bone density and structure, unspecified site: Secondary | ICD-10-CM | POA: Diagnosis not present

## 2020-08-30 DIAGNOSIS — I129 Hypertensive chronic kidney disease with stage 1 through stage 4 chronic kidney disease, or unspecified chronic kidney disease: Secondary | ICD-10-CM | POA: Diagnosis not present

## 2020-08-30 DIAGNOSIS — Z9181 History of falling: Secondary | ICD-10-CM | POA: Diagnosis not present

## 2020-08-30 DIAGNOSIS — K219 Gastro-esophageal reflux disease without esophagitis: Secondary | ICD-10-CM | POA: Diagnosis not present

## 2020-08-30 DIAGNOSIS — I252 Old myocardial infarction: Secondary | ICD-10-CM | POA: Diagnosis not present

## 2020-08-30 DIAGNOSIS — I2510 Atherosclerotic heart disease of native coronary artery without angina pectoris: Secondary | ICD-10-CM | POA: Diagnosis not present

## 2020-08-30 DIAGNOSIS — N183 Chronic kidney disease, stage 3 unspecified: Secondary | ICD-10-CM | POA: Diagnosis not present

## 2020-08-30 DIAGNOSIS — E785 Hyperlipidemia, unspecified: Secondary | ICD-10-CM | POA: Diagnosis not present

## 2020-08-31 DIAGNOSIS — M8580 Other specified disorders of bone density and structure, unspecified site: Secondary | ICD-10-CM | POA: Diagnosis not present

## 2020-08-31 DIAGNOSIS — W010XXD Fall on same level from slipping, tripping and stumbling without subsequent striking against object, subsequent encounter: Secondary | ICD-10-CM | POA: Diagnosis not present

## 2020-08-31 DIAGNOSIS — E7800 Pure hypercholesterolemia, unspecified: Secondary | ICD-10-CM | POA: Diagnosis not present

## 2020-08-31 DIAGNOSIS — M810 Age-related osteoporosis without current pathological fracture: Secondary | ICD-10-CM | POA: Diagnosis not present

## 2020-08-31 DIAGNOSIS — N183 Chronic kidney disease, stage 3 unspecified: Secondary | ICD-10-CM | POA: Diagnosis not present

## 2020-08-31 DIAGNOSIS — J309 Allergic rhinitis, unspecified: Secondary | ICD-10-CM | POA: Diagnosis not present

## 2020-08-31 DIAGNOSIS — I10 Essential (primary) hypertension: Secondary | ICD-10-CM | POA: Diagnosis not present

## 2020-08-31 DIAGNOSIS — I129 Hypertensive chronic kidney disease with stage 1 through stage 4 chronic kidney disease, or unspecified chronic kidney disease: Secondary | ICD-10-CM | POA: Diagnosis not present

## 2020-08-31 DIAGNOSIS — M1990 Unspecified osteoarthritis, unspecified site: Secondary | ICD-10-CM | POA: Diagnosis not present

## 2020-08-31 DIAGNOSIS — E785 Hyperlipidemia, unspecified: Secondary | ICD-10-CM | POA: Diagnosis not present

## 2020-08-31 DIAGNOSIS — J8410 Pulmonary fibrosis, unspecified: Secondary | ICD-10-CM | POA: Diagnosis not present

## 2020-08-31 DIAGNOSIS — I2510 Atherosclerotic heart disease of native coronary artery without angina pectoris: Secondary | ICD-10-CM | POA: Diagnosis not present

## 2020-08-31 DIAGNOSIS — S72142D Displaced intertrochanteric fracture of left femur, subsequent encounter for closed fracture with routine healing: Secondary | ICD-10-CM | POA: Diagnosis not present

## 2020-08-31 DIAGNOSIS — K219 Gastro-esophageal reflux disease without esophagitis: Secondary | ICD-10-CM | POA: Diagnosis not present

## 2020-08-31 DIAGNOSIS — M25551 Pain in right hip: Secondary | ICD-10-CM | POA: Diagnosis not present

## 2020-08-31 DIAGNOSIS — S72002D Fracture of unspecified part of neck of left femur, subsequent encounter for closed fracture with routine healing: Secondary | ICD-10-CM | POA: Diagnosis not present

## 2020-08-31 DIAGNOSIS — Z9181 History of falling: Secondary | ICD-10-CM | POA: Diagnosis not present

## 2020-08-31 DIAGNOSIS — I252 Old myocardial infarction: Secondary | ICD-10-CM | POA: Diagnosis not present

## 2020-09-01 ENCOUNTER — Other Ambulatory Visit: Payer: Self-pay | Admitting: Cardiology

## 2020-09-01 DIAGNOSIS — I10 Essential (primary) hypertension: Secondary | ICD-10-CM

## 2020-09-03 ENCOUNTER — Other Ambulatory Visit: Payer: Self-pay

## 2020-09-03 DIAGNOSIS — I10 Essential (primary) hypertension: Secondary | ICD-10-CM

## 2020-09-03 MED ORDER — AMLODIPINE BESYLATE 2.5 MG PO TABS: ORAL_TABLET | ORAL | 1 refills | Status: DC

## 2020-09-03 MED ORDER — AMLODIPINE BESYLATE 2.5 MG PO TABS: 2.5000 mg | ORAL_TABLET | Freq: Every day | ORAL | 1 refills | Status: DC

## 2020-09-04 ENCOUNTER — Other Ambulatory Visit: Payer: Self-pay | Admitting: Internal Medicine

## 2020-09-04 DIAGNOSIS — J8410 Pulmonary fibrosis, unspecified: Secondary | ICD-10-CM

## 2020-09-12 ENCOUNTER — Other Ambulatory Visit: Payer: Self-pay | Admitting: Family Medicine

## 2020-09-12 ENCOUNTER — Ambulatory Visit
Admission: RE | Admit: 2020-09-12 | Discharge: 2020-09-12 | Disposition: A | Discharge status: Home / Self Care | Payer: PPO | Source: Ambulatory Visit | Attending: Family Medicine | Admitting: Family Medicine

## 2020-09-12 DIAGNOSIS — M545 Low back pain, unspecified: Secondary | ICD-10-CM | POA: Diagnosis not present

## 2020-09-12 DIAGNOSIS — R0781 Pleurodynia: Secondary | ICD-10-CM

## 2020-09-12 DIAGNOSIS — M2578 Osteophyte, vertebrae: Secondary | ICD-10-CM | POA: Diagnosis not present

## 2020-09-12 DIAGNOSIS — M25552 Pain in left hip: Secondary | ICD-10-CM | POA: Diagnosis not present

## 2020-09-12 DIAGNOSIS — Z96642 Presence of left artificial hip joint: Secondary | ICD-10-CM | POA: Diagnosis not present

## 2020-09-12 DIAGNOSIS — M47814 Spondylosis without myelopathy or radiculopathy, thoracic region: Secondary | ICD-10-CM | POA: Diagnosis not present

## 2020-09-12 DIAGNOSIS — S72302D Unspecified fracture of shaft of left femur, subsequent encounter for closed fracture with routine healing: Secondary | ICD-10-CM | POA: Diagnosis not present

## 2020-09-19 ENCOUNTER — Ambulatory Visit (INDEPENDENT_AMBULATORY_CARE_PROVIDER_SITE_OTHER): Payer: PPO | Admitting: Orthopedic Surgery

## 2020-09-19 ENCOUNTER — Other Ambulatory Visit: Payer: Self-pay

## 2020-09-19 ENCOUNTER — Ambulatory Visit (INDEPENDENT_AMBULATORY_CARE_PROVIDER_SITE_OTHER): Payer: PPO

## 2020-09-19 DIAGNOSIS — S72142A Displaced intertrochanteric fracture of left femur, initial encounter for closed fracture: Secondary | ICD-10-CM

## 2020-09-20 DIAGNOSIS — S22080A Wedge compression fracture of T11-T12 vertebra, initial encounter for closed fracture: Secondary | ICD-10-CM | POA: Diagnosis not present

## 2020-09-23 ENCOUNTER — Encounter: Payer: Self-pay | Admitting: Orthopedic Surgery

## 2020-09-23 NOTE — Progress Notes (Signed)
Post-Op Visit Note   Patient: Jacqueline Burke           Date of Birth: [birth year]           MRN: [id number] Visit Date: 09/19/2020 PCP: Hendricks Limes, MD   Assessment & Plan:  Chief Complaint:  Chief Complaint  Patient presents with  . Left Leg - Routine Post Op    08/04/20 Left IM femur nail   Visit Diagnoses:  1. Displaced intertrochanteric fracture of left femur, initial encounter for closed fracture [facility])     Plan: Patient presents for follow-up of left femoral IM nail for central femur fracture.  Nail placed 08/04/2020.  She is doing okay.  Ambulating with a walker.  Since I have last seen her she has been diagnosed with a compression fracture at T12.  Those x-rays were reviewed and she does have a pretty severe compression fracture over 50% compression at that vertebral body level.  She had been partial weightbearing with a walker.  She has a back support.  Using a lidocaine patch.  No nerve symptoms in the legs.  Taking Norco and extra strength Tylenol.  On examination she has good ankle dorsiflexion plantarflexion strength.  Palpable pedal pulses.  No weakness with hip flexion leg extension or knee flexion.  No nerve root tension signs.  No real groin pain with internal ex rotation of the leg.  Radiographs look good on the fracture.  Okay for full weightbearing at this time.  4-week return with repeat radiographs on the hip as well as a lateral radiograph of the back.  I think in general we discussed treatment options for this compression fracture.  Talked about vertebroplasty and kyphoplasty.  She is not really interested in any type of intervention for that at this time.  We can revisit in 4 weeks.  Follow-up at that time with radiographs.  Follow-Up Instructions: No follow-ups on file.   Orders:  Orders Placed This Encounter  Procedures  . XR FEMUR MIN 2 VIEWS LEFT   No orders of the defined types were placed in this encounter.   Imaging: No results found.  PMFS  History: Patient Active Problem List   Diagnosis Date Noted  . Normochromic normocytic anemia 08/09/2020  . Essential hypertension 08/03/2020  . CKD (chronic kidney disease), stage III ([place]) 08/03/2020  . Closed intertrochanteric fracture of hip, left, initial encounter ([place]) 08/03/2020  . Pulmonary fibrosis ([place]) 08/03/2020  . Cough 03/20/2020  . CAD (coronary artery disease), native coronary artery 08/25/2017  . Hyperlipidemia LDL goal <70 08/25/2017  . NSTEMI (non-ST elevated myocardial infarction) ([place]) 06/12/2017  . Chronic chest pain   . Ureteral calculus 06/09/2017  . Edema extremities 02/16/2014  . Sinusitis, chronic 11/11/2013  . Dyspnea 11/11/2013  . PVC's (premature ventricular contractions) 08/19/2013  . Benign hypertensive heart disease without heart failure 08/15/2013   Past Medical History:  Diagnosis Date  . Allergic rhinitis   . CAD (coronary artery disease), native coronary artery    NSTEMI with 95% RCA s/p DES to RCA 06/2017  . Diverticulosis   . GERD (gastroesophageal reflux disease)   . Heart murmur    Systolic heart murmur with Aortic valve sclerosis by ECHO  . History of kidney stones   . Hyperlipidemia   . Hypertension   . Osteoarthritis    Erosive OA-MRI of R hand negative for synovitis, only OA-Dr Trudie Reed  . Osteopenia   . Pelvic prolapse   . PVC's (premature ventricular contractions)  Family History  Problem Relation Age of Onset  . Stroke Mother   . Diabetes Mother   . Heart attack Mother   . Osteoporosis Father   . Breast cancer Cousin     Past Surgical History:  Procedure Laterality Date  . BUNIONECTOMY  08/2006   R foot and hammer roe right second toe  . CARDIAC CATHETERIZATION    . CARPAL TUNNEL RELEASE  2002   bil hands  . CORONARY STENT INTERVENTION N/A 06/15/2017   Procedure: CORONARY STENT INTERVENTION;  Surgeon: Lorretta Harp, MD;  Location: [facility];  Service: Cardiovascular;  Laterality: N/A;  . CYSTOSCOPY      with laser lithotripsy Dr. Gloriann Loan 07-20-17   . CYSTOSCOPY W/ URETERAL STENT PLACEMENT Left 06/09/2017   Procedure: CYSTOSCOPY WITH RETROGRADE PYELOGRAM/URETERAL STENT PLACEMENT;  Surgeon: Lucas Mallow, MD;  Location: [facility];  Service: Urology;  Laterality: Left;  . CYSTOSCOPY WITH RETROGRADE PYELOGRAM, URETEROSCOPY AND STENT PLACEMENT Left 07/20/2017   Procedure: CYSTOSCOPY WITH RETROGRADE PYELOGRAM, LEFT URETEROSCOPY HOLMIUM LASER LITHO  AND STENT EXCHANGE;  Surgeon: Lucas Mallow, MD;  Location: [facility];  Service: Urology;  Laterality: Left;  . HOLMIUM LASER APPLICATION Left 2/99/3716   Procedure: HOLMIUM LASER APPLICATION;  Surgeon: Lucas Mallow, MD;  Location: [facility];  Service: Urology;  Laterality: Left;  . INTRAMEDULLARY (IM) NAIL INTERTROCHANTERIC Left 08/04/2020   Procedure: INTRAMEDULLARY (IM) NAIL INTERTROCHANTRIC WITH CABLES;  Surgeon: Meredith Pel, MD;  Location: [facility];  Service: Orthopedics;  Laterality: Left;  . LEFT HEART CATH AND CORONARY ANGIOGRAPHY N/A 06/15/2017   Procedure: LEFT HEART CATH AND CORONARY ANGIOGRAPHY;  Surgeon: Lorretta Harp, MD;  Location: [facility];  Service: Cardiovascular;  Laterality: N/A;  . TOTAL KNEE ARTHROPLASTY Left 08/2007  . TOTAL KNEE ARTHROPLASTY Right 03/2010   Social History   Occupational History  . Occupation: Retired  Tobacco Use  . Smoking status: Former Smoker    Packs/day: 1.00    Years: 25.00    Pack years: 25.00    Types: Cigarettes    Quit date: 06/02/1993    Years since quitting: 27.3  . Smokeless tobacco: Never Used  Vaping Use  . Vaping Use: Never used  Substance and Sexual Activity  . Alcohol use: No  . Drug use: No  . Sexual activity: Not Currently

## 2020-09-30 DIAGNOSIS — E785 Hyperlipidemia, unspecified: Secondary | ICD-10-CM | POA: Diagnosis not present

## 2020-09-30 DIAGNOSIS — S72142D Displaced intertrochanteric fracture of left femur, subsequent encounter for closed fracture with routine healing: Secondary | ICD-10-CM | POA: Diagnosis not present

## 2020-09-30 DIAGNOSIS — J8410 Pulmonary fibrosis, unspecified: Secondary | ICD-10-CM | POA: Diagnosis not present

## 2020-09-30 DIAGNOSIS — M1990 Unspecified osteoarthritis, unspecified site: Secondary | ICD-10-CM | POA: Diagnosis not present

## 2020-09-30 DIAGNOSIS — K219 Gastro-esophageal reflux disease without esophagitis: Secondary | ICD-10-CM | POA: Diagnosis not present

## 2020-09-30 DIAGNOSIS — W010XXD Fall on same level from slipping, tripping and stumbling without subsequent striking against object, subsequent encounter: Secondary | ICD-10-CM | POA: Diagnosis not present

## 2020-09-30 DIAGNOSIS — J309 Allergic rhinitis, unspecified: Secondary | ICD-10-CM | POA: Diagnosis not present

## 2020-09-30 DIAGNOSIS — I252 Old myocardial infarction: Secondary | ICD-10-CM | POA: Diagnosis not present

## 2020-09-30 DIAGNOSIS — M8580 Other specified disorders of bone density and structure, unspecified site: Secondary | ICD-10-CM | POA: Diagnosis not present

## 2020-09-30 DIAGNOSIS — N183 Chronic kidney disease, stage 3 unspecified: Secondary | ICD-10-CM | POA: Diagnosis not present

## 2020-09-30 DIAGNOSIS — I129 Hypertensive chronic kidney disease with stage 1 through stage 4 chronic kidney disease, or unspecified chronic kidney disease: Secondary | ICD-10-CM | POA: Diagnosis not present

## 2020-09-30 DIAGNOSIS — I2510 Atherosclerotic heart disease of native coronary artery without angina pectoris: Secondary | ICD-10-CM | POA: Diagnosis not present

## 2020-09-30 DIAGNOSIS — Z9181 History of falling: Secondary | ICD-10-CM | POA: Diagnosis not present

## 2020-10-03 ENCOUNTER — Other Ambulatory Visit: Payer: Self-pay | Admitting: Adult Health

## 2020-10-03 DIAGNOSIS — N3281 Overactive bladder: Secondary | ICD-10-CM

## 2020-10-10 DIAGNOSIS — Z0000 Encounter for general adult medical examination without abnormal findings: Secondary | ICD-10-CM | POA: Diagnosis not present

## 2020-10-10 DIAGNOSIS — I700 Atherosclerosis of aorta: Secondary | ICD-10-CM | POA: Diagnosis not present

## 2020-10-10 DIAGNOSIS — I10 Essential (primary) hypertension: Secondary | ICD-10-CM | POA: Diagnosis not present

## 2020-10-10 DIAGNOSIS — E7800 Pure hypercholesterolemia, unspecified: Secondary | ICD-10-CM | POA: Diagnosis not present

## 2020-10-10 DIAGNOSIS — K5900 Constipation, unspecified: Secondary | ICD-10-CM | POA: Diagnosis not present

## 2020-10-10 DIAGNOSIS — M8949 Other hypertrophic osteoarthropathy, multiple sites: Secondary | ICD-10-CM | POA: Diagnosis not present

## 2020-10-10 DIAGNOSIS — R630 Anorexia: Secondary | ICD-10-CM | POA: Diagnosis not present

## 2020-10-10 DIAGNOSIS — E559 Vitamin D deficiency, unspecified: Secondary | ICD-10-CM | POA: Diagnosis not present

## 2020-10-10 DIAGNOSIS — N183 Chronic kidney disease, stage 3 unspecified: Secondary | ICD-10-CM | POA: Diagnosis not present

## 2020-10-10 DIAGNOSIS — J8410 Pulmonary fibrosis, unspecified: Secondary | ICD-10-CM | POA: Diagnosis not present

## 2020-10-18 DIAGNOSIS — S22080D Wedge compression fracture of T11-T12 vertebra, subsequent encounter for fracture with routine healing: Secondary | ICD-10-CM | POA: Diagnosis not present

## 2020-11-01 ENCOUNTER — Encounter: Payer: Self-pay | Admitting: Emergency Medicine

## 2020-11-01 ENCOUNTER — Other Ambulatory Visit: Payer: Self-pay

## 2020-11-01 ENCOUNTER — Ambulatory Visit: Payer: PPO | Admitting: Emergency Medicine

## 2020-11-01 DIAGNOSIS — J449 Chronic obstructive pulmonary disease, unspecified: Secondary | ICD-10-CM | POA: Diagnosis not present

## 2020-11-01 DIAGNOSIS — R0602 Shortness of breath: Secondary | ICD-10-CM | POA: Diagnosis not present

## 2020-11-01 DIAGNOSIS — J8410 Pulmonary fibrosis, unspecified: Secondary | ICD-10-CM

## 2020-11-01 NOTE — Patient Instructions (Addendum)
There has been very little change on your CT scan of the chest compared with 7 years ago.  There is still some residual inflammation and scar.  I do not believe we need to do any other testing or consider medications for this given its stability. Agree with continuing Spiriva every day.  The plan is to change your Spiriva HandiHaler over to Spiriva Respimat.  You will need to take 2 puffs once daily.  We will review how the device is used with you today. Keep albuterol available use 2 puffs when needed for shortness of breath, chest tightness, wheezing. Follow with Dr Lamonte Sakai in 6 months or sooner if you have any problems

## 2020-11-01 NOTE — Progress Notes (Signed)
Subjective:    Patient ID: Jacqueline Burke, female    DOB: Oct 24, 1935, 85 y.o.   MRN: [id number]  HPI 85 year old former smoker (25 pack years) with a history of coronary disease/PTCI,, allergic rhinitis, hypertension, hyperlipidemia. I last saw her in 2015 for coughing and shortness of breath. She had mild obstruction with a decreased diffusion capacity on pulmonary function testing 11/18/2013. We also performed high-resolution CT scan of her chest 11/16/2013 that showed changes consistent with COPD some nonspecific interstitial prominence without any evidence of frank honeycomb change, question NSIP.  She had COVID-19 in June 2021, was treated as an outpatient. She does feel back up to sped now in the aftrermath of that illness.   She returns today with an increase in her cough and also more purulent sputum - has been green to yellow. This has been happening for at least months, maybe a year. Her stamina is still OK - she can shop, do her housework, doesn't feel that she has been slowed down. She does stop to rest occasionally. She was treated w abx x 2 this year. She feels "pressure" on her flanks, R>L, is present all the time.   ROV 11/01/20 --85 year old former smoker whom I saw in October 2021 for cough and dyspnea in the setting of mild obstruction.  She has nonspecific interstitial prominence on CT chest without any frank honeycomb change.  She unfortunately had COVID-19 in 11/2019 and had an increase in her cough and sputum production in the aftermath.  Has been managed on Spiriva HandiHaler She had a fall in March, had a T12 fx and L leg fx. She is slowly improving.  Her breathing is doing well. Does not seem to be limiting her.  She got spiriva respimat samples from her PCP, is going to change from the handihaler.  She has albuterol, has not needed it. No hospitalizations, no AE. Fluticasone nasal spray, Mucinex, Zyrtec  High-resolution CT scan of the chest Burke on 03/22/2020 reviewed by me,  shows persistent areas of centrilobular emphysema, some mild pulmonary fibrotic change with irregular peripheral interstitial opacities and septal thickening, subpleural bronchiolectasis without any overt honeycomb change.  Probably some slight progression compared with 11/16/2013   Review of Systems As per HPI  Past Medical History:  Diagnosis Date   Allergic rhinitis    CAD (coronary artery disease), native coronary artery    NSTEMI with 95% RCA s/p DES to RCA 06/2017   Diverticulosis    GERD (gastroesophageal reflux disease)    Heart murmur    Systolic heart murmur with Aortic valve sclerosis by ECHO   History of kidney stones    Hyperlipidemia    Hypertension    Osteoarthritis    Erosive OA-MRI of R hand negative for synovitis, only OA-Dr Trudie Reed   Osteopenia    Pelvic prolapse    PVC's (premature ventricular contractions)      Family History  Problem Relation Age of Onset   Stroke Mother    Diabetes Mother    Heart attack Mother    Osteoporosis Father    Breast cancer Cousin      Social History   Socioeconomic History   Marital status: Married    Spouse name: Not on file   Number of children: Not on file   Years of education: Not on file   Highest education level: Not on file  Occupational History   Occupation: Retired  Tobacco Use   Smoking status: Former    Packs/day: 1.00  Years: 25.00    Pack years: 25.00    Types: Cigarettes    Quit date: 06/02/1993    Years since quitting: 27.6   Smokeless tobacco: Never  Vaping Use   Vaping Use: Never used  Substance and Sexual Activity   Alcohol use: No   Drug use: No   Sexual activity: Not Currently  Other Topics Concern   Not on file  Social History Narrative   Not on file   Social Determinants of Health   Financial Resource Strain: Not on file  Food Insecurity: Not on file  Transportation Needs: Not on file  Physical Activity: Not on file  Stress: Not on file  Social Connections: Not on file   Intimate Partner Violence: Not on file     Allergies  Allergen Reactions   Ciprofloxacin Hives   Codeine Hives   Demerol [Meperidine] Hives   Hydrogen Peroxide Other (See Comments)    White blisters   Propoxyphene Other (See Comments)    Unknown Other reaction(s): OTHER   Tramadol Other (See Comments)    "ITCHY ON THE INSIDE"     Outpatient Medications Prior to Visit  Medication Sig Dispense Refill   acetaminophen (TYLENOL) 325 MG tablet Take 650 mg by mouth every 8 (eight) hours as needed.     Albuterol Sulfate (PROAIR RESPICLICK) 253 (90 Base) MCG/ACT AEPB Inhale 1 puff into the lungs daily as needed (wheezing). 1 each 0   amitriptyline (ELAVIL) 10 MG tablet Take 2 tablets (20 mg total) by mouth at bedtime. Take 2 tabs = 20 mg by mouth at bedtime 60 tablet 0   amLODipine (NORVASC) 2.5 MG tablet Take 1 tablet (2.5 mg) in addition to your 5 mg tablet daily. (Total 7.5 mg ) 90 tablet 1   amLODipine (NORVASC) 5 MG tablet Take 1 tablet (5 mg total) daily in addition to 1 tablet (2.5 mg) daily . (Total 7.5 mg ) 90 tablet 1   aspirin EC 81 MG tablet Take 1 tablet (81 mg total) by mouth daily. 30 tablet 0   atorvastatin (LIPITOR) 80 MG tablet Take 1 tablet (80 mg total) by mouth daily at 6 PM. 30 tablet 0   bisacodyl (DULCOLAX) 10 MG suppository Place 10 mg rectally as needed for moderate constipation.     Calcium Polycarbophil (FIBER) 625 MG TABS Take 2 tablets (1,250 mg total) by mouth daily. 60 tablet 0   cetirizine (ZYRTEC) 10 MG chewable tablet Chew 1 tablet (10 mg total) by mouth daily. 30 tablet 0   Cholecalciferol (VITAMIN D PO) Take 3,000 Units by mouth daily.      clopidogrel (PLAVIX) 75 MG tablet Take 1 tablet (75 mg total) by mouth daily. 30 tablet 0   docusate sodium (COLACE) 100 MG capsule Take 200 mg by mouth daily.     Ensure (ENSURE) Take 237 mLs by mouth 2 (two) times daily between meals.     fluticasone (FLONASE) 50 MCG/ACT nasal spray Place 2 sprays into both nostrils  daily. 16 g 0   gabapentin (NEURONTIN) 300 MG capsule Take 2 capsules (600 mg total) by mouth daily. 60 capsule 0   guaiFENesin (MUCINEX) 600 MG 12 hr tablet Take 1 tablet (600 mg total) by mouth 2 (two) times daily. 60 tablet 0   HYDROcodone-acetaminophen (NORCO) 5-325 MG tablet Take 1 tablet by mouth every 8 (eight) hours as needed for moderate pain. 15 tablet 0   Magnesium Hydroxide (MILK OF MAGNESIA PO) Take 30 mLs by mouth. As  needed for constipation     metoprolol tartrate (LOPRESSOR) 25 MG tablet Take 1 tablet (25 mg total) by mouth 2 (two) times daily. 60 tablet 0   Multiple Vitamin (MULTIVITAMIN WITH MINERALS) TABS tablet Take 1 tablet by mouth daily.     tiotropium (SPIRIVA HANDIHALER) 18 MCG inhalation capsule Place 1 capsule (18 mcg total) into inhaler and inhale daily. 30 capsule 30   Trospium Chloride 60 MG CP24 Take 1 capsule (60 mg total) by mouth daily. 30 capsule 0   No facility-administered medications prior to visit.        Objective:   Physical Exam Vitals:   11/01/20 1535  BP: 120/66  Pulse: 74  Temp: (!) 96.5 F (35.8 C)  SpO2: 93%  Weight: 139 lb (63 kg)  Height: 5\' 1"  (1.549 m)   Gen: Pleasant, elderly woman, in no distress, slightly kyphotic, normal affect  ENT: No lesions,  mouth clear,  oropharynx clear, no postnasal drip  Neck: No JVD, no stridor  Lungs: No use of accessory muscles, no wheeze.  She does have focal right basilar inspiratory crackles, clear on the left  Cardiovascular: RRR, systolic M 3/6, intact s2, ? S4  Musculoskeletal: No deformities, no cyanosis or clubbing  Neuro: alert, awake, non focal  Skin: Warm, no lesions or rash      Assessment & Plan:  Pulmonary fibrosis (HCC) There has been very little change on your CT scan of the chest compared with 7 years ago.  There is still some residual inflammation and scar.  I do not believe we need to do any other testing or consider medications for this given its stability.  COPD  (chronic obstructive pulmonary disease) (HCC) Agree with continuing Spiriva every day.  The plan is to change your Spiriva HandiHaler over to Spiriva Respimat.  You will need to take 2 puffs once daily.  We will review how the device is used with you today. Keep albuterol available use 2 puffs when needed for shortness of breath, chest tightness, wheezing. Follow with Dr Lamonte Sakai in 6 months or sooner if you have any problems   Dyspnea Due to mixed obstruction and restriction (due to kyphosis and mild ILD)  Baltazar Apo, MD, PhD 02/01/2021, 1:57 PM [facility] [phone] or if no answer [phone]

## 2020-11-15 DIAGNOSIS — S22080D Wedge compression fracture of T11-T12 vertebra, subsequent encounter for fracture with routine healing: Secondary | ICD-10-CM | POA: Diagnosis not present

## 2020-12-13 DIAGNOSIS — S22080D Wedge compression fracture of T11-T12 vertebra, subsequent encounter for fracture with routine healing: Secondary | ICD-10-CM | POA: Diagnosis not present

## 2020-12-26 DIAGNOSIS — M8589 Other specified disorders of bone density and structure, multiple sites: Secondary | ICD-10-CM | POA: Diagnosis not present

## 2020-12-26 DIAGNOSIS — M810 Age-related osteoporosis without current pathological fracture: Secondary | ICD-10-CM | POA: Diagnosis not present

## 2020-12-27 DIAGNOSIS — R5383 Other fatigue: Secondary | ICD-10-CM | POA: Diagnosis not present

## 2020-12-27 DIAGNOSIS — M810 Age-related osteoporosis without current pathological fracture: Secondary | ICD-10-CM | POA: Diagnosis not present

## 2020-12-27 DIAGNOSIS — E559 Vitamin D deficiency, unspecified: Secondary | ICD-10-CM | POA: Diagnosis not present

## 2020-12-28 DIAGNOSIS — L821 Other seborrheic keratosis: Secondary | ICD-10-CM | POA: Diagnosis not present

## 2020-12-28 DIAGNOSIS — L570 Actinic keratosis: Secondary | ICD-10-CM | POA: Diagnosis not present

## 2020-12-28 DIAGNOSIS — L814 Other melanin hyperpigmentation: Secondary | ICD-10-CM | POA: Diagnosis not present

## 2020-12-28 DIAGNOSIS — Z85828 Personal history of other malignant neoplasm of skin: Secondary | ICD-10-CM | POA: Diagnosis not present

## 2021-01-17 DIAGNOSIS — M810 Age-related osteoporosis without current pathological fracture: Secondary | ICD-10-CM | POA: Diagnosis not present

## 2021-02-01 DIAGNOSIS — M810 Age-related osteoporosis without current pathological fracture: Secondary | ICD-10-CM | POA: Diagnosis not present

## 2021-02-01 DIAGNOSIS — S22080D Wedge compression fracture of T11-T12 vertebra, subsequent encounter for fracture with routine healing: Secondary | ICD-10-CM | POA: Diagnosis not present

## 2021-02-01 DIAGNOSIS — J449 Chronic obstructive pulmonary disease, unspecified: Secondary | ICD-10-CM | POA: Insufficient documentation

## 2021-02-01 NOTE — Assessment & Plan Note (Signed)
There has been very little change on your CT scan of the chest compared with 7 years ago.  There is still some residual inflammation and scar.  I do not believe we need to do any other testing or consider medications for this given its stability.

## 2021-02-01 NOTE — Assessment & Plan Note (Signed)
Due to mixed obstruction and restriction (due to kyphosis and mild ILD)

## 2021-02-01 NOTE — Assessment & Plan Note (Signed)
Agree with continuing Spiriva every day.  The plan is to change your Spiriva HandiHaler over to Spiriva Respimat.  You will need to take 2 puffs once daily.  We will review how the device is used with you today. Keep albuterol available use 2 puffs when needed for shortness of breath, chest tightness, wheezing. Follow with Dr Lamonte Sakai in 6 months or sooner if you have any problems

## 2021-02-06 DIAGNOSIS — M810 Age-related osteoporosis without current pathological fracture: Secondary | ICD-10-CM | POA: Diagnosis not present

## 2021-02-06 DIAGNOSIS — E559 Vitamin D deficiency, unspecified: Secondary | ICD-10-CM | POA: Diagnosis not present

## 2021-02-19 ENCOUNTER — Other Ambulatory Visit: Payer: Self-pay | Admitting: Cardiology

## 2021-02-22 ENCOUNTER — Other Ambulatory Visit: Payer: Self-pay | Admitting: Cardiology

## 2021-04-01 DIAGNOSIS — M810 Age-related osteoporosis without current pathological fracture: Secondary | ICD-10-CM | POA: Diagnosis not present

## 2021-04-01 DIAGNOSIS — E7800 Pure hypercholesterolemia, unspecified: Secondary | ICD-10-CM | POA: Diagnosis not present

## 2021-04-01 DIAGNOSIS — M154 Erosive (osteo)arthritis: Secondary | ICD-10-CM | POA: Diagnosis not present

## 2021-04-01 DIAGNOSIS — I10 Essential (primary) hypertension: Secondary | ICD-10-CM | POA: Diagnosis not present

## 2021-04-01 DIAGNOSIS — K219 Gastro-esophageal reflux disease without esophagitis: Secondary | ICD-10-CM | POA: Diagnosis not present

## 2021-04-01 DIAGNOSIS — I2510 Atherosclerotic heart disease of native coronary artery without angina pectoris: Secondary | ICD-10-CM | POA: Diagnosis not present

## 2021-04-01 DIAGNOSIS — N183 Chronic kidney disease, stage 3 unspecified: Secondary | ICD-10-CM | POA: Diagnosis not present

## 2021-04-03 ENCOUNTER — Encounter: Payer: Self-pay | Admitting: Cardiology

## 2021-04-03 ENCOUNTER — Ambulatory Visit: Payer: PPO | Admitting: Cardiology

## 2021-04-03 ENCOUNTER — Other Ambulatory Visit: Payer: Self-pay

## 2021-04-03 VITALS — BP 110/60 | HR 72 | Ht <= 58 in | Wt 134.6 lb

## 2021-04-03 DIAGNOSIS — I10 Essential (primary) hypertension: Secondary | ICD-10-CM

## 2021-04-03 DIAGNOSIS — I493 Ventricular premature depolarization: Secondary | ICD-10-CM

## 2021-04-03 DIAGNOSIS — I2510 Atherosclerotic heart disease of native coronary artery without angina pectoris: Secondary | ICD-10-CM | POA: Diagnosis not present

## 2021-04-03 DIAGNOSIS — E785 Hyperlipidemia, unspecified: Secondary | ICD-10-CM

## 2021-04-03 MED ORDER — AMLODIPINE BESYLATE 5 MG PO TABS: ORAL_TABLET | ORAL | 3 refills | Status: DC

## 2021-04-03 MED ORDER — CLOPIDOGREL BISULFATE 75 MG PO TABS: 75.0000 mg | ORAL_TABLET | Freq: Every day | ORAL | 1 refills | Status: DC

## 2021-04-03 MED ORDER — AMLODIPINE BESYLATE 2.5 MG PO TABS: ORAL_TABLET | ORAL | 3 refills | Status: DC

## 2021-04-03 MED ORDER — METOPROLOL TARTRATE 25 MG PO TABS: 25.0000 mg | ORAL_TABLET | Freq: Two times a day (BID) | ORAL | 3 refills | Status: DC

## 2021-04-03 MED ORDER — ATORVASTATIN CALCIUM 80 MG PO TABS: 80.0000 mg | ORAL_TABLET | Freq: Every day | ORAL | 3 refills | Status: DC

## 2021-04-03 NOTE — Addendum Note (Signed)
Addended by: Antonieta Iba on: 04/03/2021 10:09 AM   Modules accepted: Orders

## 2021-04-03 NOTE — Progress Notes (Signed)
Cardiology Office Note:    Date:  04/03/2021   ID:  ILMA ACHEE, DOB [birth year], MRN [id number]  PCP:  No primary care provider on file.  Cardiologist:  Fransico Him, MD    Referring MD: No ref. provider found   Chief Complaint  Patient presents with   Coronary Artery Disease   Hypertension   Hyperlipidemia     History of Present Illness:    Jacqueline Burke is a 85 y.o. female with a hx of hypertension, asymptomatic PVCs, ASCAD s/p NSTEMI with peak troponin of 19 in setting of acute respiratory failure and E Coli bacteremia in January 2019. 2D echo with normal LVEF, elevated LVEDP and cath with  95% RCA stenosis s/p PCI of the RCA with DES and normal EF, EF 55-65% s/p DES to RCA.  She is here today for followup.  Since I saw her last she had a fall and broke her leg.  She has had a a lot of pain in her leg since then and was found to have a T12 vertebral fx and has to take hydrocodone at times and Tylenol.  She has not been as active due to this due to back pain.   She has had some DOE since her hip and vertebral fx and attributes this to sedentary state.  She denies any chest pain or pressure, PND, orthopnea, dizziness, palpitations or syncope. Occasionally she will have some LE edema.  She is compliant with her meds and is tolerating meds with no SE.      Past Medical History:  Diagnosis Date   Allergic rhinitis    CAD (coronary artery disease), native coronary artery    NSTEMI with 95% RCA s/p DES to RCA 06/2017   Diverticulosis    GERD (gastroesophageal reflux disease)    Heart murmur    Systolic heart murmur with Aortic valve sclerosis by ECHO   History of kidney stones    Hyperlipidemia    Hypertension    Osteoarthritis    Erosive OA-MRI of R hand negative for synovitis, only OA-Dr Trudie Reed   Osteopenia    Pelvic prolapse    PVC's (premature ventricular contractions)     Past Surgical History:  Procedure Laterality Date   BUNIONECTOMY  08/2006   R foot and hammer roe  right second toe   CARDIAC CATHETERIZATION     CARPAL TUNNEL RELEASE  2002   bil hands   CORONARY STENT INTERVENTION N/A 06/15/2017   Procedure: CORONARY STENT INTERVENTION;  Surgeon: Lorretta Harp, MD;  Location: [facility];  Service: Cardiovascular;  Laterality: N/A;   CYSTOSCOPY     with laser lithotripsy Dr. Gloriann Loan 07-20-17    CYSTOSCOPY W/ URETERAL STENT PLACEMENT Left 06/09/2017   Procedure: CYSTOSCOPY WITH RETROGRADE PYELOGRAM/URETERAL STENT PLACEMENT;  Surgeon: Lucas Mallow, MD;  Location: [facility];  Service: Urology;  Laterality: Left;   CYSTOSCOPY WITH RETROGRADE PYELOGRAM, URETEROSCOPY AND STENT PLACEMENT Left 07/20/2017   Procedure: CYSTOSCOPY WITH RETROGRADE PYELOGRAM, LEFT URETEROSCOPY HOLMIUM LASER LITHO  AND STENT EXCHANGE;  Surgeon: Lucas Mallow, MD;  Location: [facility];  Service: Urology;  Laterality: Left;   HOLMIUM LASER APPLICATION Left 2/68/3419   Procedure: HOLMIUM LASER APPLICATION;  Surgeon: Lucas Mallow, MD;  Location: [facility];  Service: Urology;  Laterality: Left;   INTRAMEDULLARY (IM) NAIL INTERTROCHANTERIC Left 08/04/2020   Procedure: INTRAMEDULLARY (IM) NAIL INTERTROCHANTRIC WITH CABLES;  Surgeon: Meredith Pel, MD;  Location: [facility];  Service:  Orthopedics;  Laterality: Left;   LEFT HEART CATH AND CORONARY ANGIOGRAPHY N/A 06/15/2017   Procedure: LEFT HEART CATH AND CORONARY ANGIOGRAPHY;  Surgeon: Lorretta Harp, MD;  Location: [facility];  Service: Cardiovascular;  Laterality: N/A;   TOTAL KNEE ARTHROPLASTY Left 08/2007   TOTAL KNEE ARTHROPLASTY Right 03/2010    Current Medications: Current Meds  Medication Sig   acetaminophen (TYLENOL) 325 MG tablet Take 650 mg by mouth every 8 (eight) hours as needed.   Albuterol Sulfate (PROAIR RESPICLICK) 161 (90 Base) MCG/ACT AEPB Inhale 1 puff into the lungs daily as needed (wheezing).   amitriptyline (ELAVIL) 10 MG tablet Take 2 tablets (20 mg total) by mouth at bedtime. Take 2 tabs = 20  mg by mouth at bedtime   amLODipine (NORVASC) 2.5 MG tablet Take 1 tablet (2.5 mg) in addition to your 5 mg tablet daily. (Total 7.5 mg )   amLODipine (NORVASC) 5 MG tablet TAKE 1 TABLET BY MOUTH ONCE DAILY IN  ADDITION  TO  1  TABLET  (2.5  MG)  DAILY  FOR  A  TOTAL  OF  7.5  MG   aspirin EC 81 MG tablet Take 1 tablet (81 mg total) by mouth daily.   atorvastatin (LIPITOR) 80 MG tablet Take 1 tablet (80 mg total) by mouth daily at 6 PM.   bisacodyl (DULCOLAX) 10 MG suppository Place 10 mg rectally as needed for moderate constipation.   Calcium Polycarbophil (FIBER) 625 MG TABS Take 2 tablets (1,250 mg total) by mouth daily.   cetirizine (ZYRTEC) 10 MG chewable tablet Chew 1 tablet (10 mg total) by mouth daily.   Cholecalciferol (VITAMIN D PO) Take 3,000 Units by mouth daily.    clopidogrel (PLAVIX) 75 MG tablet Take 1 tablet (75 mg total) by mouth daily.   docusate sodium (COLACE) 100 MG capsule Take 200 mg by mouth daily.   fluticasone (FLONASE) 50 MCG/ACT nasal spray Place 2 sprays into both nostrils daily.   guaiFENesin (MUCINEX) 600 MG 12 hr tablet Take 1 tablet (600 mg total) by mouth 2 (two) times daily.   HYDROcodone-acetaminophen (NORCO) 5-325 MG tablet Take 1 tablet by mouth every 8 (eight) hours as needed for moderate pain.   metoprolol tartrate (LOPRESSOR) 25 MG tablet Take 1 tablet (25 mg total) by mouth 2 (two) times daily.   Multiple Vitamin (MULTIVITAMIN WITH MINERALS) TABS tablet Take 1 tablet by mouth daily.   PROLIA 60 MG/ML SOSY injection Inject into the skin once.   tiotropium (SPIRIVA HANDIHALER) 18 MCG inhalation capsule Place 1 capsule (18 mcg total) into inhaler and inhale daily.   Trospium Chloride 60 MG CP24 Take 1 capsule (60 mg total) by mouth daily.     Allergies:   Ciprofloxacin, Codeine, Demerol [meperidine], Hydrogen peroxide, Propoxyphene, and Tramadol   Social History   Socioeconomic History   Marital status: Married    Spouse name: Not on file   Number  of children: Not on file   Years of education: Not on file   Highest education level: Not on file  Occupational History   Occupation: Retired  Tobacco Use   Smoking status: Former    Packs/day: 1.00    Years: 25.00    Pack years: 25.00    Types: Cigarettes    Quit date: 06/02/1993    Years since quitting: 27.8   Smokeless tobacco: Never  Vaping Use   Vaping Use: Never used  Substance and Sexual Activity   Alcohol use: No  Drug use: No   Sexual activity: Not Currently  Other Topics Concern   Not on file  Social History Narrative   Not on file   Social Determinants of Health   Financial Resource Strain: Not on file  Food Insecurity: Not on file  Transportation Needs: Not on file  Physical Activity: Not on file  Stress: Not on file  Social Connections: Not on file     Family History: The patient's family history includes Breast cancer in her cousin; Diabetes in her mother; Heart attack in her mother; Osteoporosis in her father; Stroke in her mother.  ROS:   Please see the history of present illness.    ROS  All other systems reviewed and negative.   EKGs/Labs/Other Studies Reviewed:    The following studies were reviewed today:   EKG:  EKG is not ordered today    Recent Labs: 08/07/2020: BUN 21; Creatinine, Ser 0.93; Potassium 3.8; Sodium 138 08/17/2020: Hemoglobin 10.3; Platelets 592   Recent Lipid Panel    Component Value Date/Time   CHOL 79 06/16/2017 0535   TRIG 116 06/16/2017 0535   HDL 23 (L) 06/16/2017 0535   CHOLHDL 3.4 06/16/2017 0535   VLDL 23 06/16/2017 0535   LDLCALC 33 06/16/2017 0535    Physical Exam:    VS:  BP 110/60   Pulse 72   Ht 4' 8.5" (1.435 m)   Wt 134 lb 9.6 oz (61.1 kg)   SpO2 95%   BMI 29.64 kg/m     Wt Readings from Last 3 Encounters:  04/03/21 134 lb 9.6 oz (61.1 kg)  11/01/20 139 lb (63 kg)  08/29/20 148 lb 6.4 oz (67.3 kg)    GEN: Well nourished, well developed in no acute distress HEENT: Normal NECK: No JVD; No  carotid bruits LYMPHATICS: No lymphadenopathy CARDIAC:RRR, no murmurs, rubs, gallops RESPIRATORY:  Clear to auscultation without rales, wheezing or rhonchi  ABDOMEN: Soft, non-tender, non-distended MUSCULOSKELETAL:  No edema; No deformity  SKIN: Warm and dry NEUROLOGIC:  Alert and oriented x 3 PSYCHIATRIC:  Normal affect   ASSESSMENT:    1. Coronary artery disease involving native coronary artery of native heart without angina pectoris   2. Essential hypertension   3. Hyperlipidemia LDL goal <70   4. PVC's (premature ventricular contractions)    PLAN:    In order of problems listed above:  1.  ASCAD S/p NSTEMI with cath showing  95% RCA stenosis s/p PCI of the RCA with DES -She denies any anginal complaints since I saw her last -Continue prescription drug management with aspirin 81 mg daily, Plavix 75 mg daily, Lopressor 25 mg twice daily and atorvastatin high-dose and refills   2.  HTN -BP is well controlled on exam today -Continue amlodipine 7.5 mg daily and Lopressor 25 mg twice daily with as needed refills   3.  HLD -LDL goal < 70 -I have personally reviewed and interpreted outside labs performed by patient's PCP which showed LDL 44, HDL 39, triglycerides 165, ALT 13 on 10/10/2020  -Continue prescription drug management with atorvastatin 80 mg daily with as needed refills  4.  PVCs -she denies any palpitations -Continue Lopressor   Medication Adjustments/Labs and Tests Ordered: Current medicines are reviewed at length with the patient today.  Concerns regarding medicines are outlined above.  No orders of the defined types were placed in this encounter.  No orders of the defined types were placed in this encounter.   Signed, Fransico Him, MD  04/03/2021  10:03 AM    [facility]

## 2021-04-03 NOTE — Patient Instructions (Addendum)

## 2021-04-08 DIAGNOSIS — R634 Abnormal weight loss: Secondary | ICD-10-CM | POA: Diagnosis not present

## 2021-04-08 DIAGNOSIS — M810 Age-related osteoporosis without current pathological fracture: Secondary | ICD-10-CM | POA: Diagnosis not present

## 2021-04-08 DIAGNOSIS — I2510 Atherosclerotic heart disease of native coronary artery without angina pectoris: Secondary | ICD-10-CM | POA: Diagnosis not present

## 2021-04-08 DIAGNOSIS — I10 Essential (primary) hypertension: Secondary | ICD-10-CM | POA: Diagnosis not present

## 2021-04-08 DIAGNOSIS — J8410 Pulmonary fibrosis, unspecified: Secondary | ICD-10-CM | POA: Diagnosis not present

## 2021-04-23 ENCOUNTER — Other Ambulatory Visit: Payer: Self-pay

## 2021-04-23 ENCOUNTER — Ambulatory Visit (HOSPITAL_COMMUNITY): Payer: PPO | Attending: Cardiology

## 2021-04-23 DIAGNOSIS — I10 Essential (primary) hypertension: Secondary | ICD-10-CM | POA: Diagnosis not present

## 2021-04-23 DIAGNOSIS — I2510 Atherosclerotic heart disease of native coronary artery without angina pectoris: Secondary | ICD-10-CM | POA: Insufficient documentation

## 2021-04-23 DIAGNOSIS — E785 Hyperlipidemia, unspecified: Secondary | ICD-10-CM | POA: Insufficient documentation

## 2021-04-23 DIAGNOSIS — I493 Ventricular premature depolarization: Secondary | ICD-10-CM | POA: Insufficient documentation

## 2021-04-23 LAB — ECHOCARDIOGRAM COMPLETE
Area-P 1/2: 3.19 cm2
S' Lateral: 2.3 cm

## 2021-04-29 ENCOUNTER — Other Ambulatory Visit: Payer: Self-pay | Admitting: Adult Health

## 2021-04-29 DIAGNOSIS — G629 Polyneuropathy, unspecified: Secondary | ICD-10-CM

## 2021-05-03 DIAGNOSIS — M810 Age-related osteoporosis without current pathological fracture: Secondary | ICD-10-CM | POA: Diagnosis not present

## 2021-05-03 DIAGNOSIS — E559 Vitamin D deficiency, unspecified: Secondary | ICD-10-CM | POA: Diagnosis not present

## 2021-05-08 DIAGNOSIS — M5459 Other low back pain: Secondary | ICD-10-CM | POA: Diagnosis not present

## 2021-05-08 DIAGNOSIS — S22080D Wedge compression fracture of T11-T12 vertebra, subsequent encounter for fracture with routine healing: Secondary | ICD-10-CM | POA: Diagnosis not present

## 2021-05-08 DIAGNOSIS — M6281 Muscle weakness (generalized): Secondary | ICD-10-CM | POA: Diagnosis not present

## 2021-05-09 ENCOUNTER — Other Ambulatory Visit: Payer: Self-pay | Admitting: Family Medicine

## 2021-05-09 DIAGNOSIS — I2510 Atherosclerotic heart disease of native coronary artery without angina pectoris: Secondary | ICD-10-CM | POA: Diagnosis not present

## 2021-05-09 DIAGNOSIS — I10 Essential (primary) hypertension: Secondary | ICD-10-CM | POA: Diagnosis not present

## 2021-05-09 DIAGNOSIS — Z1231 Encounter for screening mammogram for malignant neoplasm of breast: Secondary | ICD-10-CM

## 2021-05-09 DIAGNOSIS — M154 Erosive (osteo)arthritis: Secondary | ICD-10-CM | POA: Diagnosis not present

## 2021-05-09 DIAGNOSIS — K219 Gastro-esophageal reflux disease without esophagitis: Secondary | ICD-10-CM | POA: Diagnosis not present

## 2021-05-09 DIAGNOSIS — E7800 Pure hypercholesterolemia, unspecified: Secondary | ICD-10-CM | POA: Diagnosis not present

## 2021-05-09 DIAGNOSIS — M810 Age-related osteoporosis without current pathological fracture: Secondary | ICD-10-CM | POA: Diagnosis not present

## 2021-05-09 DIAGNOSIS — N183 Chronic kidney disease, stage 3 unspecified: Secondary | ICD-10-CM | POA: Diagnosis not present

## 2021-05-10 DIAGNOSIS — S22080D Wedge compression fracture of T11-T12 vertebra, subsequent encounter for fracture with routine healing: Secondary | ICD-10-CM | POA: Diagnosis not present

## 2021-05-10 DIAGNOSIS — M5459 Other low back pain: Secondary | ICD-10-CM | POA: Diagnosis not present

## 2021-05-10 DIAGNOSIS — M6281 Muscle weakness (generalized): Secondary | ICD-10-CM | POA: Diagnosis not present

## 2021-05-17 DIAGNOSIS — S22080D Wedge compression fracture of T11-T12 vertebra, subsequent encounter for fracture with routine healing: Secondary | ICD-10-CM | POA: Diagnosis not present

## 2021-05-17 DIAGNOSIS — M6281 Muscle weakness (generalized): Secondary | ICD-10-CM | POA: Diagnosis not present

## 2021-05-17 DIAGNOSIS — M5459 Other low back pain: Secondary | ICD-10-CM | POA: Diagnosis not present

## 2021-05-21 DIAGNOSIS — M6281 Muscle weakness (generalized): Secondary | ICD-10-CM | POA: Diagnosis not present

## 2021-05-21 DIAGNOSIS — M5459 Other low back pain: Secondary | ICD-10-CM | POA: Diagnosis not present

## 2021-05-21 DIAGNOSIS — S22080D Wedge compression fracture of T11-T12 vertebra, subsequent encounter for fracture with routine healing: Secondary | ICD-10-CM | POA: Diagnosis not present

## 2021-05-24 DIAGNOSIS — S22080D Wedge compression fracture of T11-T12 vertebra, subsequent encounter for fracture with routine healing: Secondary | ICD-10-CM | POA: Diagnosis not present

## 2021-05-24 DIAGNOSIS — M6281 Muscle weakness (generalized): Secondary | ICD-10-CM | POA: Diagnosis not present

## 2021-05-24 DIAGNOSIS — M5459 Other low back pain: Secondary | ICD-10-CM | POA: Diagnosis not present

## 2021-05-29 DIAGNOSIS — S22080D Wedge compression fracture of T11-T12 vertebra, subsequent encounter for fracture with routine healing: Secondary | ICD-10-CM | POA: Diagnosis not present

## 2021-05-29 DIAGNOSIS — M6281 Muscle weakness (generalized): Secondary | ICD-10-CM | POA: Diagnosis not present

## 2021-05-29 DIAGNOSIS — M5459 Other low back pain: Secondary | ICD-10-CM | POA: Diagnosis not present

## 2021-05-30 DIAGNOSIS — M5459 Other low back pain: Secondary | ICD-10-CM | POA: Diagnosis not present

## 2021-05-30 DIAGNOSIS — M6281 Muscle weakness (generalized): Secondary | ICD-10-CM | POA: Diagnosis not present

## 2021-05-30 DIAGNOSIS — S22080D Wedge compression fracture of T11-T12 vertebra, subsequent encounter for fracture with routine healing: Secondary | ICD-10-CM | POA: Diagnosis not present

## 2021-06-05 DIAGNOSIS — M154 Erosive (osteo)arthritis: Secondary | ICD-10-CM | POA: Diagnosis not present

## 2021-06-05 DIAGNOSIS — M545 Low back pain, unspecified: Secondary | ICD-10-CM | POA: Diagnosis not present

## 2021-06-05 DIAGNOSIS — Z6825 Body mass index (BMI) 25.0-25.9, adult: Secondary | ICD-10-CM | POA: Diagnosis not present

## 2021-06-05 DIAGNOSIS — M2550 Pain in unspecified joint: Secondary | ICD-10-CM | POA: Diagnosis not present

## 2021-06-05 DIAGNOSIS — M150 Primary generalized (osteo)arthritis: Secondary | ICD-10-CM | POA: Diagnosis not present

## 2021-06-05 DIAGNOSIS — E663 Overweight: Secondary | ICD-10-CM | POA: Diagnosis not present

## 2021-06-05 DIAGNOSIS — M810 Age-related osteoporosis without current pathological fracture: Secondary | ICD-10-CM | POA: Diagnosis not present

## 2021-06-11 DIAGNOSIS — R350 Frequency of micturition: Secondary | ICD-10-CM | POA: Diagnosis not present

## 2021-06-11 DIAGNOSIS — N3941 Urge incontinence: Secondary | ICD-10-CM | POA: Diagnosis not present

## 2021-06-13 ENCOUNTER — Ambulatory Visit
Admission: RE | Admit: 2021-06-13 | Discharge: 2021-06-13 | Disposition: A | Discharge status: Home / Self Care | Payer: PPO | Source: Ambulatory Visit | Attending: Family Medicine | Admitting: Family Medicine

## 2021-06-13 DIAGNOSIS — Z1231 Encounter for screening mammogram for malignant neoplasm of breast: Secondary | ICD-10-CM | POA: Diagnosis not present

## 2021-06-25 DIAGNOSIS — I10 Essential (primary) hypertension: Secondary | ICD-10-CM | POA: Diagnosis not present

## 2021-06-25 DIAGNOSIS — M154 Erosive (osteo)arthritis: Secondary | ICD-10-CM | POA: Diagnosis not present

## 2021-06-25 DIAGNOSIS — K219 Gastro-esophageal reflux disease without esophagitis: Secondary | ICD-10-CM | POA: Diagnosis not present

## 2021-06-25 DIAGNOSIS — I2510 Atherosclerotic heart disease of native coronary artery without angina pectoris: Secondary | ICD-10-CM | POA: Diagnosis not present

## 2021-06-25 DIAGNOSIS — N183 Chronic kidney disease, stage 3 unspecified: Secondary | ICD-10-CM | POA: Diagnosis not present

## 2021-06-25 DIAGNOSIS — M810 Age-related osteoporosis without current pathological fracture: Secondary | ICD-10-CM | POA: Diagnosis not present

## 2021-06-25 DIAGNOSIS — E7800 Pure hypercholesterolemia, unspecified: Secondary | ICD-10-CM | POA: Diagnosis not present

## 2021-07-31 DIAGNOSIS — M810 Age-related osteoporosis without current pathological fracture: Secondary | ICD-10-CM | POA: Diagnosis not present

## 2021-07-31 DIAGNOSIS — R5383 Other fatigue: Secondary | ICD-10-CM | POA: Diagnosis not present

## 2021-07-31 DIAGNOSIS — E559 Vitamin D deficiency, unspecified: Secondary | ICD-10-CM | POA: Diagnosis not present

## 2021-08-07 DIAGNOSIS — M810 Age-related osteoporosis without current pathological fracture: Secondary | ICD-10-CM | POA: Diagnosis not present

## 2021-08-07 DIAGNOSIS — G5601 Carpal tunnel syndrome, right upper limb: Secondary | ICD-10-CM | POA: Diagnosis not present

## 2021-10-08 DIAGNOSIS — D649 Anemia, unspecified: Secondary | ICD-10-CM | POA: Diagnosis not present

## 2021-10-08 DIAGNOSIS — E559 Vitamin D deficiency, unspecified: Secondary | ICD-10-CM | POA: Diagnosis not present

## 2021-10-08 DIAGNOSIS — I10 Essential (primary) hypertension: Secondary | ICD-10-CM | POA: Diagnosis not present

## 2021-10-08 DIAGNOSIS — E7800 Pure hypercholesterolemia, unspecified: Secondary | ICD-10-CM | POA: Diagnosis not present

## 2021-10-08 DIAGNOSIS — M130 Polyarthritis, unspecified: Secondary | ICD-10-CM | POA: Diagnosis not present

## 2021-10-11 DIAGNOSIS — N183 Chronic kidney disease, stage 3 unspecified: Secondary | ICD-10-CM | POA: Diagnosis not present

## 2021-10-11 DIAGNOSIS — E7800 Pure hypercholesterolemia, unspecified: Secondary | ICD-10-CM | POA: Diagnosis not present

## 2021-10-11 DIAGNOSIS — K5900 Constipation, unspecified: Secondary | ICD-10-CM | POA: Diagnosis not present

## 2021-10-11 DIAGNOSIS — J8410 Pulmonary fibrosis, unspecified: Secondary | ICD-10-CM | POA: Diagnosis not present

## 2021-10-11 DIAGNOSIS — Z0000 Encounter for general adult medical examination without abnormal findings: Secondary | ICD-10-CM | POA: Diagnosis not present

## 2021-10-11 DIAGNOSIS — R630 Anorexia: Secondary | ICD-10-CM | POA: Diagnosis not present

## 2021-10-11 DIAGNOSIS — M8949 Other hypertrophic osteoarthropathy, multiple sites: Secondary | ICD-10-CM | POA: Diagnosis not present

## 2021-10-11 DIAGNOSIS — I700 Atherosclerosis of aorta: Secondary | ICD-10-CM | POA: Diagnosis not present

## 2021-10-11 DIAGNOSIS — E559 Vitamin D deficiency, unspecified: Secondary | ICD-10-CM | POA: Diagnosis not present

## 2021-10-11 DIAGNOSIS — I10 Essential (primary) hypertension: Secondary | ICD-10-CM | POA: Diagnosis not present

## 2021-10-22 ENCOUNTER — Ambulatory Visit: Payer: PPO | Admitting: Emergency Medicine

## 2021-10-22 ENCOUNTER — Encounter: Payer: Self-pay | Admitting: Emergency Medicine

## 2021-10-22 DIAGNOSIS — R0600 Dyspnea, unspecified: Secondary | ICD-10-CM | POA: Diagnosis not present

## 2021-10-22 DIAGNOSIS — J449 Chronic obstructive pulmonary disease, unspecified: Secondary | ICD-10-CM

## 2021-10-22 DIAGNOSIS — J8410 Pulmonary fibrosis, unspecified: Secondary | ICD-10-CM
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
## 2021-10-22 NOTE — Addendum Note (Signed)
Addended by: Lorretta Harp on: 10/22/2021 04:29 PM   Modules accepted: Orders

## 2021-10-22 NOTE — Assessment & Plan Note (Signed)
Stable ILD without any clear etiology.  Has been unchanged over many years.  No intervention needed

## 2021-10-22 NOTE — Assessment & Plan Note (Signed)
She believes she is getting some benefit from this Spiriva.  Now on Respimat and only taking 1 puff daily.  We can continue this regimen.
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
## 2021-10-22 NOTE — Patient Instructions (Signed)
We will arrange for an overnight sleep study Please continue Spiriva Respimat 1 inhalation once daily. Follow Dr. Lamonte Sakai in 2 months so we can review your sleep study results.

## 2021-12-11 DIAGNOSIS — I10 Essential (primary) hypertension: Secondary | ICD-10-CM | POA: Diagnosis not present

## 2021-12-19 ENCOUNTER — Telehealth: Payer: Self-pay | Admitting: Emergency Medicine

## 2021-12-19 NOTE — Telephone Encounter (Signed)
Can someone please check and see what is going on with the study?

## 2021-12-23 NOTE — Telephone Encounter (Signed)
Working on prior auth - records have been faxed to [employer].

## 2021-12-23 NOTE — Telephone Encounter (Signed)
Pt has been scheduled for sleep study and her follow up appt with Dr Lamonte Sakai.  Gave pt appt info - nothing further needed.

## 2021-12-27 DIAGNOSIS — R0981 Nasal congestion: Secondary | ICD-10-CM | POA: Diagnosis not present

## 2021-12-27 DIAGNOSIS — U071 COVID-19: Secondary | ICD-10-CM | POA: Diagnosis not present

## 2021-12-27 DIAGNOSIS — J029 Acute pharyngitis, unspecified: Secondary | ICD-10-CM | POA: Diagnosis not present

## 2021-12-27 DIAGNOSIS — R051 Acute cough: Secondary | ICD-10-CM | POA: Diagnosis not present

## 2021-12-31 ENCOUNTER — Ambulatory Visit: Payer: PPO | Admitting: Emergency Medicine

## 2022-01-02 DIAGNOSIS — L570 Actinic keratosis: Secondary | ICD-10-CM | POA: Diagnosis not present

## 2022-01-02 DIAGNOSIS — Z85828 Personal history of other malignant neoplasm of skin: Secondary | ICD-10-CM | POA: Diagnosis not present

## 2022-01-02 DIAGNOSIS — L821 Other seborrheic keratosis: Secondary | ICD-10-CM | POA: Diagnosis not present

## 2022-01-02 DIAGNOSIS — L814 Other melanin hyperpigmentation: Secondary | ICD-10-CM | POA: Diagnosis not present

## 2022-01-07 ENCOUNTER — Telehealth: Payer: Self-pay | Admitting: Emergency Medicine

## 2022-01-07 MED ORDER — DOXYCYCLINE HYCLATE 100 MG PO TABS: 100.0000 mg | ORAL_TABLET | Freq: Two times a day (BID) | ORAL | 0 refills | Status: DC

## 2022-01-07 NOTE — Telephone Encounter (Signed)
She has been having increased shortness of breath with exertion and cough  that is having some green stuff that happens during the day. This has been worse  in the last week. She has not taken anything over  the counter. Please advise.

## 2022-01-07 NOTE — Telephone Encounter (Signed)
Please ask her to take doxycycline 100 mg twice a day for 7 days She would probably benefit from using either nasal saline spray 1-2 times daily. She continues to have drainage and congestion we can try starting a steroid nasal spray like fluticasone (Flonase)

## 2022-01-07 NOTE — Telephone Encounter (Signed)
Pt came in office. Scheduled for sleep study on 8/21. Pt states she is continuously choking on pheglm not only at night but during the day while awake. States pheglm is yellow and thick. States this occurred last week.Also has severe nasal drainage.  Is unsure if a sleep study is what she needs.Did not wish to schedule OV as she just wants to discuss this if possible first.

## 2022-01-07 NOTE — Telephone Encounter (Signed)
Called and spoke to pt. Informed her of the recs per Dr. Lamonte Sakai. Rx sent to preferred pharmacy. Pt verbalized understanding and denied any further questions or concerns at this time.

## 2022-01-15 ENCOUNTER — Telehealth: Payer: Self-pay | Admitting: Emergency Medicine

## 2022-01-15 NOTE — Telephone Encounter (Signed)
If she believes she is improved then we can hold off on the PSG

## 2022-01-15 NOTE — Telephone Encounter (Signed)
Spoke with pt who states medication given by Dr. Lamonte Sakai has cleared up all production of thick mucus and pt states she is feeling better and sleeping well. Pt states she doesn't know that the sleep study is necessary now as those episodes she dicussed in her last OV where at night and during the day. Pt would like to know if Dr. Lamonte Sakai still wants her to complete the study. Please advise.

## 2022-01-15 NOTE — Telephone Encounter (Signed)
Called and spoke with patient and let her know that Dr Lamonte Sakai states that we can hold off on sleep test for now. Nothing further needed

## 2022-01-20 ENCOUNTER — Encounter (HOSPITAL_BASED_OUTPATIENT_CLINIC_OR_DEPARTMENT_OTHER): Payer: PPO | Admitting: Pulmonary Disease

## 2022-02-04 ENCOUNTER — Telehealth: Payer: Self-pay

## 2022-02-04 ENCOUNTER — Ambulatory Visit: Payer: PPO | Admitting: Emergency Medicine

## 2022-02-04 NOTE — Telephone Encounter (Signed)
Called pt and left detailed VM (per DPR) to reschedule OV r/t sleep study not being completed.

## 2022-02-05 NOTE — Telephone Encounter (Signed)
Error

## 2022-02-11 DIAGNOSIS — M810 Age-related osteoporosis without current pathological fracture: Secondary | ICD-10-CM | POA: Diagnosis not present

## 2022-02-11 DIAGNOSIS — G5601 Carpal tunnel syndrome, right upper limb: Secondary | ICD-10-CM | POA: Diagnosis not present

## 2022-03-02 ENCOUNTER — Other Ambulatory Visit: Payer: Self-pay | Admitting: Cardiology

## 2022-03-02 DIAGNOSIS — I2510 Atherosclerotic heart disease of native coronary artery without angina pectoris: Secondary | ICD-10-CM

## 2022-03-14 DIAGNOSIS — M898X5 Other specified disorders of bone, thigh: Secondary | ICD-10-CM | POA: Diagnosis not present

## 2022-03-14 DIAGNOSIS — M1612 Unilateral primary osteoarthritis, left hip: Secondary | ICD-10-CM | POA: Diagnosis not present

## 2022-04-10 ENCOUNTER — Encounter: Payer: Self-pay | Admitting: Cardiology

## 2022-04-10 ENCOUNTER — Ambulatory Visit: Payer: PPO | Attending: Cardiology | Admitting: Cardiology

## 2022-04-10 VITALS — BP 144/70 | HR 53 | Ht <= 58 in | Wt 139.2 lb

## 2022-04-10 DIAGNOSIS — I2510 Atherosclerotic heart disease of native coronary artery without angina pectoris: Secondary | ICD-10-CM

## 2022-04-10 DIAGNOSIS — I493 Ventricular premature depolarization: Secondary | ICD-10-CM | POA: Diagnosis not present

## 2022-04-10 DIAGNOSIS — I10 Essential (primary) hypertension: Secondary | ICD-10-CM | POA: Diagnosis not present

## 2022-04-10 DIAGNOSIS — E785 Hyperlipidemia, unspecified: Secondary | ICD-10-CM | POA: Diagnosis not present

## 2022-04-10 NOTE — Progress Notes (Signed)
Cardiology Office Note:    Date:  04/10/2022   ID:  JASNOOR TRUSSELL, DOB Aug 30, 1935, MRN [id number]  PCP:  Glenis Smoker, MD  Cardiologist:  Fransico Him, MD    Referring MD: Glenis Smoker, *   Chief Complaint  Patient presents with   Coronary Artery Disease   Hypertension   Hyperlipidemia     History of Present Illness:    Jacqueline Burke is a 86 y.o. female with a hx of hypertension, asymptomatic PVCs, ASCAD s/p NSTEMI with peak troponin of 19 in setting of acute respiratory failure and E Coli bacteremia in January 2019. 2D echo with normal LVEF, elevated LVEDP and cath with  95% RCA stenosis s/p PCI of the RCA with DES and normal EF, EF 55-65% s/p DES to RCA.  She is here today for followup and is doing well.  She denies any chest pain or pressure, SOB, DOE, PND, orthopnea, LE edema, dizziness, palpitations or syncope. She is compliant with her meds and is tolerating meds with no SE.    Past Medical History:  Diagnosis Date   Allergic rhinitis    CAD (coronary artery disease), native coronary artery    NSTEMI with 95% RCA s/p DES to RCA 06/2017   Diverticulosis    GERD (gastroesophageal reflux disease)    Heart murmur    Systolic heart murmur with Aortic valve sclerosis by ECHO   History of kidney stones    Hyperlipidemia    Hypertension    Osteoarthritis    Erosive OA-MRI of R hand negative for synovitis, only OA-Dr Trudie Reed   Osteopenia    Pelvic prolapse    PVC's (premature ventricular contractions)     Past Surgical History:  Procedure Laterality Date   BUNIONECTOMY  08/2006   R foot and hammer roe right second toe   CARDIAC CATHETERIZATION     CARPAL TUNNEL RELEASE  2002   bil hands   CORONARY STENT INTERVENTION N/A 06/15/2017   Procedure: CORONARY STENT INTERVENTION;  Surgeon: Lorretta Harp, MD;  Location: [facility];  Service: Cardiovascular;  Laterality: N/A;   CYSTOSCOPY     with laser lithotripsy Dr. Gloriann Loan 07-20-17    CYSTOSCOPY W/  URETERAL STENT PLACEMENT Left 06/09/2017   Procedure: CYSTOSCOPY WITH RETROGRADE PYELOGRAM/URETERAL STENT PLACEMENT;  Surgeon: Lucas Mallow, MD;  Location: [facility];  Service: Urology;  Laterality: Left;   CYSTOSCOPY WITH RETROGRADE PYELOGRAM, URETEROSCOPY AND STENT PLACEMENT Left 07/20/2017   Procedure: CYSTOSCOPY WITH RETROGRADE PYELOGRAM, LEFT URETEROSCOPY HOLMIUM LASER LITHO  AND STENT EXCHANGE;  Surgeon: Lucas Mallow, MD;  Location: [facility];  Service: Urology;  Laterality: Left;   HOLMIUM LASER APPLICATION Left 09/08/8117   Procedure: HOLMIUM LASER APPLICATION;  Surgeon: Lucas Mallow, MD;  Location: [facility];  Service: Urology;  Laterality: Left;   INTRAMEDULLARY (IM) NAIL INTERTROCHANTERIC Left 08/04/2020   Procedure: INTRAMEDULLARY (IM) NAIL INTERTROCHANTRIC WITH CABLES;  Surgeon: Meredith Pel, MD;  Location: [facility];  Service: Orthopedics;  Laterality: Left;   LEFT HEART CATH AND CORONARY ANGIOGRAPHY N/A 06/15/2017   Procedure: LEFT HEART CATH AND CORONARY ANGIOGRAPHY;  Surgeon: Lorretta Harp, MD;  Location: [facility];  Service: Cardiovascular;  Laterality: N/A;   TOTAL KNEE ARTHROPLASTY Left 08/2007   TOTAL KNEE ARTHROPLASTY Right 03/2010    Current Medications: Current Meds  Medication Sig   acetaminophen (TYLENOL) 325 MG tablet Take 650 mg by mouth every 8 (eight) hours as needed.  Albuterol Sulfate (PROAIR RESPICLICK) 332 (90 Base) MCG/ACT AEPB Inhale 1 puff into the lungs daily as needed (wheezing).   amitriptyline (ELAVIL) 10 MG tablet Take 2 tablets (20 mg total) by mouth at bedtime. Take 2 tabs = 20 mg by mouth at bedtime   amLODipine (NORVASC) 2.5 MG tablet Take 1 tablet (2.5 mg) in addition to your 5 mg tablet daily. (Total 7.5 mg )   amLODipine (NORVASC) 5 MG tablet TAKE 1 TABLET BY MOUTH ONCE DAILY IN  ADDITION  TO  1  TABLET  (2.5  MG)  DAILY  FOR  A  TOTAL  OF  7.5  MG   aspirin EC 81 MG tablet Take 1 tablet (81 mg total) by mouth daily.    atorvastatin (LIPITOR) 80 MG tablet Take 1 tablet (80 mg total) by mouth daily at 6 PM.   bisacodyl (DULCOLAX) 10 MG suppository Place 10 mg rectally as needed for moderate constipation.   Calcium Polycarbophil (FIBER) 625 MG TABS Take 2 tablets (1,250 mg total) by mouth daily.   cetirizine (ZYRTEC) 10 MG chewable tablet Chew 1 tablet (10 mg total) by mouth daily.   Cholecalciferol (VITAMIN D PO) Take 3,000 Units by mouth daily.    clopidogrel (PLAVIX) 75 MG tablet Take 1 tablet by mouth once daily   docusate sodium (COLACE) 100 MG capsule Take 200 mg by mouth daily.   doxycycline (VIBRA-TABS) 100 MG tablet Take 1 tablet (100 mg total) by mouth 2 (two) times daily.   Ensure (ENSURE) Take 237 mLs by mouth 2 (two) times daily between meals.   fluticasone (FLONASE) 50 MCG/ACT nasal spray Place 2 sprays into both nostrils daily.   gabapentin (NEURONTIN) 300 MG capsule Take 2 capsules (600 mg total) by mouth daily.   guaiFENesin (MUCINEX) 600 MG 12 hr tablet Take 1 tablet (600 mg total) by mouth 2 (two) times daily.   Magnesium Hydroxide (MILK OF MAGNESIA PO) Take 30 mLs by mouth. As needed for constipation   metoprolol tartrate (LOPRESSOR) 25 MG tablet Take 1 tablet (25 mg total) by mouth 2 (two) times daily.   Multiple Vitamin (MULTIVITAMIN WITH MINERALS) TABS tablet Take 1 tablet by mouth daily.   PROLIA 60 MG/ML SOSY injection Inject into the skin once.   Tiotropium Bromide Monohydrate (SPIRIVA RESPIMAT) 1.25 MCG/ACT AERS 2 puffs   Trospium Chloride 60 MG CP24 Take 1 capsule (60 mg total) by mouth daily.     Allergies:   Ciprofloxacin, Codeine, Demerol [meperidine], Hydrogen peroxide, Propoxyphene, and Tramadol   Social History   Socioeconomic History   Marital status: Married    Spouse name: Not on file   Number of children: Not on file   Years of education: Not on file   Highest education level: Not on file  Occupational History   Occupation: Retired  Tobacco Use   Smoking status:  Former    Packs/day: 1.00    Years: 25.00    Total pack years: 25.00    Types: Cigarettes    Quit date: 06/02/1993    Years since quitting: 28.8   Smokeless tobacco: Never  Vaping Use   Vaping Use: Never used  Substance and Sexual Activity   Alcohol use: No   Drug use: No   Sexual activity: Not Currently  Other Topics Concern   Not on file  Social History Narrative   Not on file   Social Determinants of Health   Financial Resource Strain: Not on file  Food Insecurity: No Food  Insecurity (10/29/2017)   Hunger Vital Sign    Worried About Running Out of Food in the Last Year: Never true    Ran Out of Food in the Last Year: Never true  Transportation Needs: No Transportation Needs (10/29/2017)   PRAPARE - Hydrologist (Medical): No    Lack of Transportation (Non-Medical): No  Physical Activity: Inactive (10/29/2017)   Exercise Vital Sign    Days of Exercise per Week: 0 days    Minutes of Exercise per Session: 0 min  Stress: No Stress Concern Present (10/29/2017)   [place]    Feeling of Stress : Only a little  Social Connections: Not on file     Family History: The patient's family history includes Breast cancer in her cousin; Diabetes in her mother; Heart attack in her mother; Osteoporosis in her father; Stroke in her mother.  ROS:   Please see the history of present illness.    ROS  All other systems reviewed and negative.   EKGs/Labs/Other Studies Reviewed:    The following studies were reviewed today:   EKG:  EKG is ordered today  and demonstrates NSR with septal infarct and no ST changes   Recent Labs: No results found for requested labs within last 365 days.   Recent Lipid Panel    Component Value Date/Time   CHOL 79 06/16/2017 0535   TRIG 116 06/16/2017 0535   HDL 23 (L) 06/16/2017 0535   CHOLHDL 3.4 06/16/2017 0535   VLDL 23 06/16/2017 0535   LDLCALC 33  06/16/2017 0535    Physical Exam:    VS:  BP (!) 144/70   Pulse (!) 53   Ht 4' 8.5" (1.435 m)   Wt 139 lb 3.2 oz (63.1 kg)   SpO2 95%   BMI 30.66 kg/m     Wt Readings from Last 3 Encounters:  04/10/22 139 lb 3.2 oz (63.1 kg)  10/22/21 135 lb 12.8 oz (61.6 kg)  04/03/21 134 lb 9.6 oz (61.1 kg)    GEN: Well nourished, well developed in no acute distress HEENT: Normal NECK: No JVD; No carotid bruits LYMPHATICS: No lymphadenopathy CARDIAC:RRR, no murmurs, rubs, gallops RESPIRATORY:  Clear to auscultation without rales, wheezing or rhonchi  ABDOMEN: Soft, non-tender, non-distended MUSCULOSKELETAL:  No edema; No deformity  SKIN: Warm and dry NEUROLOGIC:  Alert and oriented x 3 PSYCHIATRIC:  Normal affect  ASSESSMENT:    1. Coronary artery disease involving native coronary artery of native heart without angina pectoris   2. Essential hypertension   3. Hyperlipidemia LDL goal <70   4. PVC's (premature ventricular contractions)    PLAN:    In order of problems listed above:  1.  ASCAD S/p NSTEMI with cath showing  95% RCA stenosis s/p PCI of the RCA with DES -She denies any anginal complaints since I saw her last -Continue prescription drug management with aspirin 81 mg daily, Plavix 75 mg daily, Lopressor 25 mg twice daily and atorvastatin high-dose with as needed refills  2.  HTN -BP is fairly well controlled on exam today -Continue prescription drug management with amlodipine 7.5 mg daily, Lopressor 25 mg twice daily with as needed refills  3.  HLD -LDL goal < 70 -I have personally reviewed and interpreted outside labs performed by patient's PCP which showed LDL 55 and HDL 47 on 10/08/2021 ALT was 18 -Continue prescription drug management atorvastatin 80 mg daily with as needed refills  4.  PVCs -She has not had any palpitations since last her last -Continue Lopressor 25 mg twice daily with as needed refills   Medication Adjustments/Labs and Tests Ordered: Current  medicines are reviewed at length with the patient today.  Concerns regarding medicines are outlined above.  Orders Placed This Encounter  Procedures   EKG 12-Lead   No orders of the defined types were placed in this encounter.   Signed, Fransico Him, MD  04/10/2022 3:02 PM    [facility]

## 2022-04-10 NOTE — Patient Instructions (Signed)
Medication Instructions:  Your physician recommends that you continue on your current medications as directed. Please refer to the Current Medication list given to you today.  *If you need a refill on your cardiac medications before your next appointment, please call your pharmacy*  Follow-Up: At [facility], you and your health needs are our priority.  As part of our continuing mission to provide you with exceptional heart care, we have created designated Provider Care Teams.  These Care Teams include your primary Cardiologist (physician) and Advanced Practice Providers (APPs -  Physician Assistants and Nurse Practitioners) who all work together to provide you with the care you need, when you need it.  Your next appointment:   1 year(s)  The format for your next appointment:   In Person  Provider:   Traci Turner, MD     Important Information About Sugar       

## 2022-04-11 DIAGNOSIS — M1612 Unilateral primary osteoarthritis, left hip: Secondary | ICD-10-CM | POA: Diagnosis not present

## 2022-04-14 DIAGNOSIS — D509 Iron deficiency anemia, unspecified: Secondary | ICD-10-CM | POA: Diagnosis not present

## 2022-04-14 DIAGNOSIS — R001 Bradycardia, unspecified: Secondary | ICD-10-CM | POA: Diagnosis not present

## 2022-04-14 DIAGNOSIS — E7800 Pure hypercholesterolemia, unspecified: Secondary | ICD-10-CM | POA: Diagnosis not present

## 2022-04-14 DIAGNOSIS — I10 Essential (primary) hypertension: Secondary | ICD-10-CM | POA: Diagnosis not present

## 2022-04-14 DIAGNOSIS — I700 Atherosclerosis of aorta: Secondary | ICD-10-CM | POA: Diagnosis not present

## 2022-04-14 DIAGNOSIS — E559 Vitamin D deficiency, unspecified: Secondary | ICD-10-CM | POA: Diagnosis not present

## 2022-04-14 DIAGNOSIS — M810 Age-related osteoporosis without current pathological fracture: Secondary | ICD-10-CM | POA: Diagnosis not present

## 2022-04-15 ENCOUNTER — Telehealth: Payer: Self-pay | Admitting: Cardiology

## 2022-04-15 NOTE — Telephone Encounter (Signed)
New  Message:     Jacqueline Burke from Dr Courtney Paris office caqlled . She wants to talk to the nurse about patient's medicine.    Pt c/o medication issue:  1. Name of Medication: Lopresor 25 mg  2. How are you currently taking this medication (dosage and times per day)?  2 times a day  3. Are you having a reaction (difficulty breathing--STAT)?   4. What is your medication issue? Lowering her heart rate it was down to 49 yesterday when she was in the office- Dr Lindell Noe wants to know about decreasing her dose

## 2022-04-17 NOTE — Telephone Encounter (Signed)
Spoke with patient to advise per Dr. Radford Pax, take metoprolol 12.'5mg'$  (1/2tablet) twice a day and check blood pressure and HR. Call us with readings in 1 week.  Patient verbalized understanding.

## 2022-05-09 DIAGNOSIS — M1612 Unilateral primary osteoarthritis, left hip: Secondary | ICD-10-CM | POA: Diagnosis not present

## 2022-05-09 DIAGNOSIS — M545 Low back pain, unspecified: Secondary | ICD-10-CM | POA: Diagnosis not present

## 2022-05-12 ENCOUNTER — Other Ambulatory Visit: Payer: Self-pay | Admitting: Family Medicine

## 2022-05-12 DIAGNOSIS — Z1231 Encounter for screening mammogram for malignant neoplasm of breast: Secondary | ICD-10-CM

## 2022-06-06 DIAGNOSIS — M79662 Pain in left lower leg: Secondary | ICD-10-CM | POA: Diagnosis not present

## 2022-06-10 ENCOUNTER — Other Ambulatory Visit: Payer: Self-pay | Admitting: Cardiology

## 2022-06-10 DIAGNOSIS — N3941 Urge incontinence: Secondary | ICD-10-CM | POA: Diagnosis not present

## 2022-06-10 DIAGNOSIS — R350 Frequency of micturition: Secondary | ICD-10-CM | POA: Diagnosis not present

## 2022-06-14 DIAGNOSIS — M545 Low back pain, unspecified: Secondary | ICD-10-CM | POA: Diagnosis not present

## 2022-06-17 ENCOUNTER — Telehealth: Payer: Self-pay | Admitting: *Deleted

## 2022-06-17 DIAGNOSIS — M25552 Pain in left hip: Secondary | ICD-10-CM | POA: Diagnosis not present

## 2022-06-17 DIAGNOSIS — M1612 Unilateral primary osteoarthritis, left hip: Secondary | ICD-10-CM | POA: Diagnosis not present

## 2022-06-17 NOTE — Telephone Encounter (Signed)
   Pre-operative Risk Assessment    Patient Name: Jacqueline Burke  DOB: Apr 28, 1936 MRN: [id number]      Request for Surgical Clearance    Procedure:   LT FEMORAL HEAD COLLAPSE LT HIP CONVERSION FROM IM NAIL TO TOTAL HIP REPLACEMENT  Date of Surgery:  Clearance TBD                                 Surgeon:  Charlies Constable, MD Surgeon's Group or Practice Name:  [facility] Phone number:  [phone]  EXT: [phone] Fax number:  [phone]   Type of Clearance Requested:   - Pharmacy:  Hold Aspirin and Clopidogrel (Plavix) NOT INDICATED HOW LONG   Type of Anesthesia:  Spinal   Additional requests/questions:    [name]   06/17/2022, 2:50 PM

## 2022-06-17 NOTE — Telephone Encounter (Signed)
   Name: Jacqueline Burke  DOB: [birth year]  MRN: [id number]  Primary Cardiologist: Fransico Him, MD   Preoperative team, please contact this patient and set up a phone call appointment for further preoperative risk assessment. Please obtain consent and complete medication review. Thank you for your help.  I confirm that guidance regarding antiplatelet and oral anticoagulation therapy has been completed and, if necessary, noted below.  Per algorithm patient can hold Plavix for 5 days prior to procedure. Regarding ASA therapy, we recommend continuation of ASA throughout the perioperative period.  However, if the surgeon feels that cessation of ASA is required in the perioperative period, it may be stopped 5-7 days prior to surgery with a plan to resume it as soon as felt to be feasible from a surgical standpoint in the post-operative period.    Mable Fill, Marissa Nestle, NP 06/17/2022, 2:57 PM [place]

## 2022-06-17 NOTE — Telephone Encounter (Signed)
Left message to call back.  Pt needs preop appt for clearance at [address].

## 2022-06-18 ENCOUNTER — Telehealth: Payer: Self-pay

## 2022-06-18 NOTE — Telephone Encounter (Signed)
Pt is returning call. Requesting call back.

## 2022-06-18 NOTE — Telephone Encounter (Addendum)
Spoke with patient who is agreeable with doing a tele visit on 2/8 at 9:20 am. Mec rec to be completed and consent has been given.

## 2022-06-18 NOTE — Telephone Encounter (Signed)
  Patient Consent for Virtual Visit        Jacqueline Burke has provided verbal consent on 06/18/2022 for a virtual visit (video or telephone).   CONSENT FOR VIRTUAL VISIT FOR:  Jacqueline Burke  By participating in this virtual visit I agree to the following:  I hereby voluntarily request, consent and authorize [facility] and its employed or contracted physicians, physician assistants, nurse practitioners or other licensed health care professionals (the Practitioner), to provide me with telemedicine health care services (the "Services") as deemed necessary by the treating Practitioner. I acknowledge and consent to receive the Services by the Practitioner via telemedicine. I understand that the telemedicine visit will involve communicating with the Practitioner through live audiovisual communication technology and the disclosure of certain medical information by electronic transmission. I acknowledge that I have been given the opportunity to request an in-person assessment or other available alternative prior to the telemedicine visit and am voluntarily participating in the telemedicine visit.  I understand that I have the right to withhold or withdraw my consent to the use of telemedicine in the course of my care at any time, without affecting my right to future care or treatment, and that the Practitioner or I may terminate the telemedicine visit at any time. I understand that I have the right to inspect all information obtained and/or recorded in the course of the telemedicine visit and may receive copies of available information for a reasonable fee.  I understand that some of the potential risks of receiving the Services via telemedicine include:  Delay or interruption in medical evaluation due to technological equipment failure or disruption; Information transmitted may not be sufficient (e.g. poor resolution of images) to allow for appropriate medical decision making by the Practitioner;  and/or  In rare instances, security protocols could fail, causing a breach of personal health information.  Furthermore, I acknowledge that it is my responsibility to provide information about my medical history, conditions and care that is complete and accurate to the best of my ability. I acknowledge that Practitioner's advice, recommendations, and/or decision may be based on factors not within their control, such as incomplete or inaccurate data provided by me or distortions of diagnostic images or specimens that may result from electronic transmissions. I understand that the practice of medicine is not an exact science and that Practitioner makes no warranties or guarantees regarding treatment outcomes. I acknowledge that a copy of this consent can be made available to me via my patient portal ([facility]), or I can request a printed copy by calling the office of [facility].    I understand that my insurance will be billed for this visit.   I have read or had this consent read to me. I understand the contents of this consent, which adequately explains the benefits and risks of the Services being provided via telemedicine.  I have been provided ample opportunity to ask questions regarding this consent and the Services and have had my questions answered to my satisfaction. I give my informed consent for the services to be provided through the use of telemedicine in my medical care

## 2022-06-19 ENCOUNTER — Telehealth: Payer: Self-pay

## 2022-06-19 NOTE — Telephone Encounter (Signed)
Called and left voicemail for patient to call office back to schedule a follow up visit with Dr Lamonte Sakai or an NP to get cleared for her urgent hip surgery.

## 2022-06-23 DIAGNOSIS — N1831 Chronic kidney disease, stage 3a: Secondary | ICD-10-CM | POA: Diagnosis not present

## 2022-06-23 DIAGNOSIS — S22080G Wedge compression fracture of T11-T12 vertebra, subsequent encounter for fracture with delayed healing: Secondary | ICD-10-CM | POA: Diagnosis not present

## 2022-06-23 DIAGNOSIS — I700 Atherosclerosis of aorta: Secondary | ICD-10-CM | POA: Diagnosis not present

## 2022-06-23 DIAGNOSIS — R935 Abnormal findings on diagnostic imaging of other abdominal regions, including retroperitoneum: Secondary | ICD-10-CM | POA: Diagnosis not present

## 2022-06-23 DIAGNOSIS — N183 Chronic kidney disease, stage 3 unspecified: Secondary | ICD-10-CM | POA: Diagnosis not present

## 2022-06-23 DIAGNOSIS — J8410 Pulmonary fibrosis, unspecified: Secondary | ICD-10-CM | POA: Diagnosis not present

## 2022-06-23 DIAGNOSIS — I10 Essential (primary) hypertension: Secondary | ICD-10-CM | POA: Diagnosis not present

## 2022-06-25 NOTE — Telephone Encounter (Signed)
Called patient and left voicemail for her to call the office back to get set up for an office visit to get cleared for surgery.

## 2022-07-03 ENCOUNTER — Telehealth: Payer: Self-pay | Admitting: Oncology

## 2022-07-03 NOTE — Telephone Encounter (Signed)
Attempted to contact patient in regards to referral but no answer so voicemail was left for patient to call back.

## 2022-07-08 ENCOUNTER — Ambulatory Visit
Admission: RE | Admit: 2022-07-08 | Discharge: 2022-07-08 | Disposition: A | Discharge status: Home / Self Care | Payer: No Typology Code available for payment source | Source: Ambulatory Visit | Attending: Family Medicine | Admitting: Family Medicine

## 2022-07-08 DIAGNOSIS — Z1231 Encounter for screening mammogram for malignant neoplasm of breast: Secondary | ICD-10-CM | POA: Diagnosis not present

## 2022-07-09 NOTE — Progress Notes (Signed)
Virtual Visit via Telephone Note   Because of Jacqueline Burke's co-morbid illnesses, she is at least at moderate risk for complications without adequate follow up.  This format is felt to be most appropriate for this patient at this time.  The patient did not have access to video technology/had technical difficulties with video requiring transitioning to audio format only (telephone).  All issues noted in this document were discussed and addressed.  No physical exam could be performed with this format.  Please refer to the patient's chart for her consent to telehealth for [facility].  Evaluation Performed:  Preoperative cardiovascular risk assessment _____________   Date:  07/09/2022   Patient ID:  Jacqueline Burke, DOB 1935-11-25, MRN [id number] Patient Location:  Home Provider location:   Office  Primary Care Provider:  Shon Hale, MD Primary Cardiologist:  Armanda Magic, MD  Chief Complaint / Patient Profile   87 y.o. y/o female with a h/o CAD s/p NSTEMI with 95% RCA stenosis treated with DES and PCI in 2019, PVCs, HTN who is pending left total hip replacement and presents today for telephonic preoperative cardiovascular risk assessment.  History of Present Illness    Jacqueline Burke is a 87 y.o. female who presents via audio/video conferencing for a telehealth visit today.  Pt was last seen in cardiology clinic on 04/10/2022 by Dr. Mayford Knife.  At that time Jacqueline Burke was doing well with no chest pain or complaints.  The patient is now pending procedure as outlined above. Since her last visit, she has been doing well and staying active.  She has no complaints of chest pain or shortness of breath.  She reports that last summer her and her husband laid a brick patio together and planted plants and flowers without any difficulty. She denies chest pain, shortness of breath, lower extremity edema, fatigue, palpitations, melena, hematuria, hemoptysis, diaphoresis, weakness,  presyncope, syncope, orthopnea, and PND.   Plavix for 5 days prior to procedure. Regarding ASA therapy, we recommend continuation of ASA throughout the perioperative period.  However, if the surgeon feels that cessation of ASA is required in the perioperative period, it may be stopped 5-7 day  Past Medical History    Past Medical History:  Diagnosis Date   Allergic rhinitis    CAD (coronary artery disease), native coronary artery    NSTEMI with 95% RCA s/p DES to RCA 06/2017   Diverticulosis    GERD (gastroesophageal reflux disease)    Heart murmur    Systolic heart murmur with Aortic valve sclerosis by ECHO   History of kidney stones    Hyperlipidemia    Hypertension    Osteoarthritis    Erosive OA-MRI of R hand negative for synovitis, only OA-Dr Nickola Major   Osteopenia    Pelvic prolapse    PVC's (premature ventricular contractions)    Past Surgical History:  Procedure Laterality Date   BUNIONECTOMY  08/2006   R foot and hammer roe right second toe   CARDIAC CATHETERIZATION     CARPAL TUNNEL RELEASE  2002   bil hands   CORONARY STENT INTERVENTION N/A 06/15/2017   Procedure: CORONARY STENT INTERVENTION;  Surgeon: Runell Gess, MD;  Location: [facility];  Service: Cardiovascular;  Laterality: N/A;   CYSTOSCOPY     with laser lithotripsy Dr. Alvester Morin 07-20-17    CYSTOSCOPY W/ URETERAL STENT PLACEMENT Left 06/09/2017   Procedure: CYSTOSCOPY WITH RETROGRADE PYELOGRAM/URETERAL STENT PLACEMENT;  Surgeon: Ray Church III,  MD;  Location: [facility];  Service: Urology;  Laterality: Left;   CYSTOSCOPY WITH RETROGRADE PYELOGRAM, URETEROSCOPY AND STENT PLACEMENT Left 07/20/2017   Procedure: CYSTOSCOPY WITH RETROGRADE PYELOGRAM, LEFT URETEROSCOPY HOLMIUM LASER LITHO  AND STENT EXCHANGE;  Surgeon: Crista Elliot, MD;  Location: [facility];  Service: Urology;  Laterality: Left;   HOLMIUM LASER APPLICATION Left 07/20/2017   Procedure: HOLMIUM LASER APPLICATION;  Surgeon: Crista Elliot, MD;   Location: [facility];  Service: Urology;  Laterality: Left;   INTRAMEDULLARY (IM) NAIL INTERTROCHANTERIC Left 08/04/2020   Procedure: INTRAMEDULLARY (IM) NAIL INTERTROCHANTRIC WITH CABLES;  Surgeon: Cammy Copa, MD;  Location: [facility];  Service: Orthopedics;  Laterality: Left;   LEFT HEART CATH AND CORONARY ANGIOGRAPHY N/A 06/15/2017   Procedure: LEFT HEART CATH AND CORONARY ANGIOGRAPHY;  Surgeon: Runell Gess, MD;  Location: [facility];  Service: Cardiovascular;  Laterality: N/A;   TOTAL KNEE ARTHROPLASTY Left 08/2007   TOTAL KNEE ARTHROPLASTY Right 03/2010    Allergies  Allergies  Allergen Reactions   Ciprofloxacin Hives   Codeine Hives   Demerol [Meperidine] Hives   Hydrogen Peroxide Other (See Comments)    White blisters   Propoxyphene Other (See Comments)    Unknown Other reaction(s): OTHER   Tramadol Other (See Comments)    "ITCHY ON THE INSIDE"    Home Medications    Prior to Admission medications   Medication Sig Start Date End Date Taking? Authorizing Provider  acetaminophen (TYLENOL) 325 MG tablet Take 650 mg by mouth every 8 (eight) hours as needed.    [provider]  Albuterol Sulfate (PROAIR RESPICLICK) 108 (90 Base) MCG/ACT AEPB Inhale 1 puff into the lungs daily as needed (wheezing). 08/29/20   Medina-Vargas, Monina C, NP  amitriptyline (ELAVIL) 10 MG tablet Take 2 tablets (20 mg total) by mouth at bedtime. Take 2 tabs = 20 mg by mouth at bedtime 08/29/20   Medina-Vargas, Monina C, NP  amLODipine (NORVASC) 2.5 MG tablet Take 1 tablet (2.5 mg) in addition to your 5 mg tablet daily. (Total 7.5 mg ) 04/03/21   Turner, Cornelious Bryant, MD  amLODipine (NORVASC) 5 MG tablet TAKE 1 TABLET BY MOUTH ONCE DAILY IN  ADDITION  TO  1  TABLET  OF  2.5  MG  FOR  A  TOTAL  OF  7.5  MG 06/10/22   Quintella Reichert, MD  aspirin EC 81 MG tablet Take 1 tablet (81 mg total) by mouth daily. 08/29/20   Medina-Vargas, Monina C, NP  atorvastatin (LIPITOR) 80 MG tablet Take 1 tablet  (80 mg total) by mouth daily at 6 PM. 04/03/21   Turner, Cornelious Bryant, MD  bisacodyl (DULCOLAX) 10 MG suppository Place 10 mg rectally as needed for moderate constipation.    [provider]  Calcium Polycarbophil (FIBER) 625 MG TABS Take 2 tablets (1,250 mg total) by mouth daily. 08/29/20   Medina-Vargas, Monina C, NP  cetirizine (ZYRTEC) 10 MG chewable tablet Chew 1 tablet (10 mg total) by mouth daily. 08/29/20   Medina-Vargas, Monina C, NP  Cholecalciferol (VITAMIN D PO) Take 3,000 Units by mouth daily.     [provider]  clopidogrel (PLAVIX) 75 MG tablet Take 1 tablet by mouth once daily 03/03/22   Quintella Reichert, MD  docusate sodium (COLACE) 100 MG capsule Take 200 mg by mouth daily.    [provider]  doxycycline (VIBRA-TABS) 100 MG tablet Take 1 tablet (100 mg total) by mouth  2 (two) times daily. 01/07/22   Leslye Peer, MD  Ensure (ENSURE) Take 237 mLs by mouth 2 (two) times daily between meals.    [provider]  fluticasone (FLONASE) 50 MCG/ACT nasal spray Place 2 sprays into both nostrils daily. 08/29/20   Medina-Vargas, Monina C, NP  gabapentin (NEURONTIN) 300 MG capsule Take 2 capsules (600 mg total) by mouth daily. 08/29/20   Medina-Vargas, Monina C, NP  guaiFENesin (MUCINEX) 600 MG 12 hr tablet Take 1 tablet (600 mg total) by mouth 2 (two) times daily. 08/29/20   Medina-Vargas, Monina C, NP  Magnesium Hydroxide (MILK OF MAGNESIA PO) Take 30 mLs by mouth. As needed for constipation    [provider]  metoprolol tartrate (LOPRESSOR) 25 MG tablet Take 1 tablet (25 mg total) by mouth 2 (two) times daily. 04/03/21   Quintella Reichert, MD  Multiple Vitamin (MULTIVITAMIN WITH MINERALS) TABS tablet Take 1 tablet by mouth daily.    [provider]  PROLIA 60 MG/ML SOSY injection Inject into the skin once. 02/06/21   [provider]  Tiotropium Bromide Monohydrate (SPIRIVA RESPIMAT) 1.25 MCG/ACT AERS 2 puffs 10/16/20   [provider]   Trospium Chloride 60 MG CP24 Take 1 capsule (60 mg total) by mouth daily. 08/29/20   Medina-Vargas, Margit Banda, NP    Physical Exam    Vital Signs:  Jacqueline Burke does not have vital signs available for review today.  Given telephonic nature of communication, physical exam is limited. AAOx3. NAD. Normal affect.  Speech and respirations are unlabored.  Accessory Clinical Findings    None  Assessment & Plan    1.  Preoperative Cardiovascular Risk Assessment:  The patient affirms she has been doing well without any new cardiac symptoms. They are able to achieve 5 METS without cardiac limitations. Therefore, based on ACC/AHA guidelines, the patient would be at acceptable risk for the planned procedure without further cardiovascular testing. The patient was advised that if she develops new symptoms prior to surgery to contact our office to arrange for a follow-up visit, and she verbalized understanding.   Ms. Wees perioperative risk of a major cardiac event is 6.6% according to the Revised Cardiac Risk Index (RCRI).  Therefore, she is at high risk for perioperative complications.   Her functional capacity is good at 5.07 METs according to the Duke Activity Status Index (DASI). Recommendations: According to ACC/AHA guidelines, no further cardiovascular testing needed.  The patient may proceed to surgery at acceptable risk.   Antiplatelet and/or Anticoagulation Recommendations: Clopidogrel (Plavix) can be held for 5 days prior to her surgery and resumed as soon as possible post op.  She will need guidance for holding ASA from her PCP.  The patient was advised that if she develops new symptoms prior to surgery to contact our office to arrange for a follow-up visit, and she verbalized understanding.   A copy of this note will be routed to requesting surgeon.  Time:   Today, I have spent 6 minutes with the patient with telehealth technology discussing medical history, symptoms, and  management plan.     Napoleon Form, Leodis Rains, NP  07/09/2022, 8:26 PM

## 2022-07-10 ENCOUNTER — Ambulatory Visit: Payer: No Typology Code available for payment source | Attending: Cardiology

## 2022-07-10 DIAGNOSIS — Z01810 Encounter for preprocedural cardiovascular examination: Secondary | ICD-10-CM

## 2022-07-11 ENCOUNTER — Ambulatory Visit (INDEPENDENT_AMBULATORY_CARE_PROVIDER_SITE_OTHER): Payer: No Typology Code available for payment source | Admitting: Emergency Medicine

## 2022-07-11 ENCOUNTER — Telehealth: Payer: Self-pay | Admitting: Emergency Medicine

## 2022-07-11 ENCOUNTER — Encounter: Payer: Self-pay | Admitting: Emergency Medicine

## 2022-07-11 VITALS — BP 114/70 | HR 89 | Temp 97.7°F | Ht <= 58 in | Wt 128.8 lb

## 2022-07-11 DIAGNOSIS — J449 Chronic obstructive pulmonary disease, unspecified: Secondary | ICD-10-CM

## 2022-07-11 DIAGNOSIS — J8410 Pulmonary fibrosis, unspecified: Secondary | ICD-10-CM | POA: Diagnosis not present

## 2022-07-11 DIAGNOSIS — J328 Other chronic sinusitis: Secondary | ICD-10-CM | POA: Diagnosis not present
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
## 2022-07-11 NOTE — Assessment & Plan Note (Addendum)
Continue nasal saline, allergy regimen.

## 2022-07-11 NOTE — Assessment & Plan Note (Signed)
Mild obstructive lung disease.  She was able to stop her Spiriva without difficulty, never needs her albuterol.  In retrospect her shortness of breath seem to be more related to nasal obstruction.  No contraindications to surgery, low to moderate risk.  It would be beneficial to remember that she has mild COPD in the perioperative period in case she needs bronchodilators.

## 2022-07-11 NOTE — Telephone Encounter (Signed)
Fax received from Dr. Charlies Constable with [facility] to perform a LEFT FEMORAL HEAD COLLAPSE LEFT HIP CONVERSION FROM IM NAIL TO TOTAL HIP REPLACEMENT on patient.  Patient needs surgery clearance. Surgery is pending. Patient was seen on 10/22/2021. Office protocol is a risk assessment can be sent to surgeon if patient has been seen in 60 days or less.   Sending to Dr Lamonte Sakai for risk assessment or recommendations if patient needs to be seen in office prior to surgical procedure.    Patient has office visit with Dr Lamonte Sakai on 07/11/2022. This was faxed as an urgent clearance request. Thank you

## 2022-07-11 NOTE — Assessment & Plan Note (Signed)
Mild NSIP pattern, stable over several years.  No evidence of clinical progression.  Hold off on repeat imaging for now.  She is at low to moderate risk for general anesthesia due to her age.  No respiratory limitations or concerns

## 2022-07-11 NOTE — Patient Instructions (Addendum)
You are at low risk for general anesthesia and surgery from a respiratory standpoint.  Your principal risk for surgery is age.  Certainly there is no contraindication to proceeding with your hip surgery since the benefits outweigh these risks. We will not restart Spiriva at this time Please keep albuterol available to use 2 puffs if needed for shortness of breath, chest tightness, wheezing. Continue your nasal saline spray as needed Follow with Dr. Lamonte Sakai in 12 months or sooner if you have any problems.

## 2022-07-11 NOTE — Telephone Encounter (Signed)
Will eval today.

## 2022-07-14 DIAGNOSIS — I10 Essential (primary) hypertension: Secondary | ICD-10-CM | POA: Diagnosis not present

## 2022-07-14 DIAGNOSIS — R413 Other amnesia: Secondary | ICD-10-CM | POA: Diagnosis not present

## 2022-07-14 DIAGNOSIS — I2510 Atherosclerotic heart disease of native coronary artery without angina pectoris: Secondary | ICD-10-CM | POA: Diagnosis not present

## 2022-07-14 NOTE — Telephone Encounter (Signed)
OV notes and clearance form have been faxed back to [facility]. Nothing further needed at this time.  °

## 2022-07-16 ENCOUNTER — Ambulatory Visit: Payer: Self-pay | Admitting: Physician Assistant

## 2022-07-16 DIAGNOSIS — G8929 Other chronic pain: Secondary | ICD-10-CM

## 2022-07-16 DIAGNOSIS — M1612 Unilateral primary osteoarthritis, left hip: Secondary | ICD-10-CM | POA: Diagnosis not present

## 2022-07-16 NOTE — H&P (Signed)
TOTAL HIP REVISION ADMISSION H&P  Patient is admitted for left revision total hip arthroplasty.  Subjective:  Chief Complaint: left hip pain  HPI: Jacqueline Burke, 87 y.o. female, has a history of pain and functional disability in the left hip due to  failed IM nail  and patient has failed non-surgical conservative treatments for greater than 12 weeks to include NSAID's and/or analgesics, use of assistive devices, and activity modification. The indications for the revision total hip arthroplasty are  collapse of left femoral neck .  Onset of symptoms was gradual starting 1 years ago with gradually worsening course since that time.  Prior procedures on the left hip include  IM nail .  Patient currently rates pain in the left hip at 10 out of 10 with activity.  There is night pain, worsening of pain with activity and weight bearing, trendelenberg gait, pain that interfers with activities of daily living, and pain with passive range of motion. Patient has evidence of  collapse of femoral neck  by imaging studies.  This condition presents safety issues increasing the risk of falls.    There is no current active infection.  Patient Active Problem List   Diagnosis Date Noted  . Nocturnal dyspnea 10/22/2021  . COPD (chronic obstructive pulmonary disease) ([place]) 02/01/2021  . Normochromic normocytic anemia 08/09/2020  . Essential hypertension 08/03/2020  . CKD (chronic kidney disease), stage III ([place]) 08/03/2020  . Closed intertrochanteric fracture of hip, left, initial encounter ([place]) 08/03/2020  . Pulmonary fibrosis ([place]) 08/03/2020  . Cough 03/20/2020  . CAD (coronary artery disease), native coronary artery 08/25/2017  . Hyperlipidemia LDL goal <70 08/25/2017  . NSTEMI (non-ST elevated myocardial infarction) ([place]) 06/12/2017  . Chronic chest pain   . Ureteral calculus 06/09/2017  . Edema extremities 02/16/2014  . Sinusitis, chronic 11/11/2013  . Dyspnea 11/11/2013  . PVC's (premature ventricular  contractions) 08/19/2013  . Benign hypertensive heart disease without heart failure 08/15/2013   Past Medical History:  Diagnosis Date  . Allergic rhinitis   . CAD (coronary artery disease), native coronary artery    NSTEMI with 95% RCA s/p DES to RCA 06/2017  . Diverticulosis   . GERD (gastroesophageal reflux disease)   . Heart murmur    Systolic heart murmur with Aortic valve sclerosis by ECHO  . History of kidney stones   . Hyperlipidemia   . Hypertension   . Osteoarthritis    Erosive OA-MRI of R hand negative for synovitis, only OA-Dr Trudie Reed  . Osteopenia   . Pelvic prolapse   . PVC's (premature ventricular contractions)     Past Surgical History:  Procedure Laterality Date  . BUNIONECTOMY  08/2006   R foot and hammer roe right second toe  . CARDIAC CATHETERIZATION    . CARPAL TUNNEL RELEASE  2002   bil hands  . CORONARY STENT INTERVENTION N/A 06/15/2017   Procedure: CORONARY STENT INTERVENTION;  Surgeon: Lorretta Harp, MD;  Location: [facility];  Service: Cardiovascular;  Laterality: N/A;  . CYSTOSCOPY     with laser lithotripsy Dr. Gloriann Loan 07-20-17   . CYSTOSCOPY W/ URETERAL STENT PLACEMENT Left 06/09/2017   Procedure: CYSTOSCOPY WITH RETROGRADE PYELOGRAM/URETERAL STENT PLACEMENT;  Surgeon: Lucas Mallow, MD;  Location: [facility];  Service: Urology;  Laterality: Left;  . CYSTOSCOPY WITH RETROGRADE PYELOGRAM, URETEROSCOPY AND STENT PLACEMENT Left 07/20/2017   Procedure: CYSTOSCOPY WITH RETROGRADE PYELOGRAM, LEFT URETEROSCOPY HOLMIUM LASER LITHO  AND STENT EXCHANGE;  Surgeon: Lucas Mallow, MD;  Location: [facility];  Service: Urology;  Laterality: Left;  . HOLMIUM LASER APPLICATION Left [id number]   Procedure: HOLMIUM LASER APPLICATION;  Surgeon: Lucas Mallow, MD;  Location: [facility];  Service: Urology;  Laterality: Left;  . INTRAMEDULLARY (IM) NAIL INTERTROCHANTERIC Left 08/04/2020   Procedure: INTRAMEDULLARY (IM) NAIL INTERTROCHANTRIC WITH CABLES;  Surgeon: Meredith Pel, MD;  Location: [facility];  Service: Orthopedics;  Laterality: Left;  . LEFT HEART CATH AND CORONARY ANGIOGRAPHY N/A 06/15/2017   Procedure: LEFT HEART CATH AND CORONARY ANGIOGRAPHY;  Surgeon: Lorretta Harp, MD;  Location: [facility];  Service: Cardiovascular;  Laterality: N/A;  . TOTAL KNEE ARTHROPLASTY Left 08/2007  . TOTAL KNEE ARTHROPLASTY Right 03/2010    Current Outpatient Medications  Medication Sig Dispense Refill Last Dose  . acetaminophen (TYLENOL) 325 MG tablet Take 650 mg by mouth every 8 (eight) hours as needed.     . Albuterol Sulfate (PROAIR RESPICLICK) 123XX123 (90 Base) MCG/ACT AEPB Inhale 1 puff into the lungs daily as needed (wheezing). 1 each 0   . amitriptyline (ELAVIL) 10 MG tablet Take 2 tablets (20 mg total) by mouth at bedtime. Take 2 tabs = 20 mg by mouth at bedtime 60 tablet 0   . amLODipine (NORVASC) 2.5 MG tablet Take 1 tablet (2.5 mg) in addition to your 5 mg tablet daily. (Total 7.5 mg ) 90 tablet 3   . amLODipine (NORVASC) 5 MG tablet TAKE 1 TABLET BY MOUTH ONCE DAILY IN  ADDITION  TO  1  TABLET  OF  2.5  MG  FOR  A  TOTAL  OF  7.5  MG 90 tablet 3   . aspirin EC 81 MG tablet Take 1 tablet (81 mg total) by mouth daily. 30 tablet 0   . atorvastatin (LIPITOR) 80 MG tablet Take 1 tablet (80 mg total) by mouth daily at 6 PM. 90 tablet 3   . bisacodyl (DULCOLAX) 10 MG suppository Place 10 mg rectally as needed for moderate constipation.     . Calcium Polycarbophil (FIBER) 625 MG TABS Take 2 tablets (1,250 mg total) by mouth daily. 60 tablet 0   . cetirizine (ZYRTEC) 10 MG chewable tablet Chew 1 tablet (10 mg total) by mouth daily. 30 tablet 0   . Cholecalciferol (VITAMIN D PO) Take 3,000 Units by mouth daily.      . clopidogrel (PLAVIX) 75 MG tablet Take 1 tablet by mouth once daily 90 tablet 0   . docusate sodium (COLACE) 100 MG capsule Take 200 mg by mouth daily.     Marland Kitchen doxycycline (VIBRA-TABS) 100 MG tablet Take 1 tablet (100 mg total) by mouth 2 (two)  times daily. 14 tablet 0   . Ensure (ENSURE) Take 237 mLs by mouth 2 (two) times daily between meals.     . fluticasone (FLONASE) 50 MCG/ACT nasal spray Place 2 sprays into both nostrils daily. 16 g 0   . gabapentin (NEURONTIN) 300 MG capsule Take 2 capsules (600 mg total) by mouth daily. 60 capsule 0   . guaiFENesin (MUCINEX) 600 MG 12 hr tablet Take 1 tablet (600 mg total) by mouth 2 (two) times daily. 60 tablet 0   . Magnesium Hydroxide (MILK OF MAGNESIA PO) Take 30 mLs by mouth. As needed for constipation     . metoprolol tartrate (LOPRESSOR) 25 MG tablet Take 1 tablet (25 mg total) by mouth 2 (two) times daily. 180 tablet 3   . Multiple Vitamin (MULTIVITAMIN WITH MINERALS) TABS  tablet Take 1 tablet by mouth daily.     Marland Kitchen PROLIA 60 MG/ML SOSY injection Inject into the skin once.     . Tiotropium Bromide Monohydrate (SPIRIVA RESPIMAT) 1.25 MCG/ACT AERS 2 puffs     . Trospium Chloride 60 MG CP24 Take 1 capsule (60 mg total) by mouth daily. 30 capsule 0    No current facility-administered medications for this visit.   Allergies  Allergen Reactions  . Ciprofloxacin Hives  . Codeine Hives  . Demerol [Meperidine] Hives  . Hydrogen Peroxide Other (See Comments)    White blisters  . Propoxyphene Other (See Comments)    Unknown Other reaction(s): OTHER  . Tramadol Other (See Comments)    "ITCHY ON THE INSIDE"    Social History   Tobacco Use  . Smoking status: Former    Packs/day: 1.00    Years: 25.00    Total pack years: 25.00    Types: Cigarettes    Quit date: 06/02/1993    Years since quitting: 29.1  . Smokeless tobacco: Never  Substance Use Topics  . Alcohol use: No    Family History  Problem Relation Age of Onset  . Stroke Mother   . Diabetes Mother   . Heart attack Mother   . Osteoporosis Father   . Breast cancer Cousin       Review of Systems  Constitutional:  Positive for unexpected weight change.  Respiratory:  Positive for shortness of breath.    Gastrointestinal:  Positive for blood in stool.  Genitourinary:  Positive for frequency.  Musculoskeletal:  Positive for arthralgias.  Hematological:  Bruises/bleeds easily.  All other systems reviewed and are negative.  Objective:  Physical Exam Constitutional:      General: She is not in acute distress.    Appearance: Normal appearance.  HENT:     Head: Normocephalic and atraumatic.  Eyes:     Extraocular Movements: Extraocular movements intact.     Pupils: Pupils are equal, round, and reactive to light.  Cardiovascular:     Rate and Rhythm: Normal rate and regular rhythm.     Pulses: Normal pulses.     Heart sounds: Normal heart sounds.  Pulmonary:     Effort: Pulmonary effort is normal. No respiratory distress.     Breath sounds: Normal breath sounds. No wheezing.  Abdominal:     General: Abdomen is flat. Bowel sounds are normal. There is no distension.     Palpations: Abdomen is soft.     Tenderness: There is no abdominal tenderness.  Musculoskeletal:     Cervical back: Normal range of motion and neck supple.     Left hip: Tenderness and bony tenderness present. Decreased range of motion. Decreased strength.  Lymphadenopathy:     Cervical: No cervical adenopathy.  Skin:    General: Skin is warm and dry.     Findings: No erythema or rash.  Neurological:     General: No focal deficit present.     Mental Status: She is alert and oriented to person, place, and time.  Psychiatric:        Mood and Affect: Mood normal.        Behavior: Behavior normal.   Vital signs in last 24 hours: @VSRANGES$ @   Labs:   Estimated body mass index is 27.87 kg/m as calculated from the following:   Height as of 07/11/22: 4' 9"$  (1.448 m).   Weight as of 07/11/22: 58.4 kg.  Imaging Review:  Plain radiographs demonstrate moderate  degenerative joint disease of the left hip(s). The bone quality appears to be fair for age and reported activity level.     Assessment/Plan:  End stage  arthritis, left hip(s) with failed previous IM nail.  The patient history, physical examination, clinical judgement of the provider and imaging studies are consistent with end stage degenerative joint disease of the left hip(s), previous IM nail L hip. Conversion to total hip arthroplasty is deemed medically necessary. The treatment options including medical management, injection therapy, arthroscopy and arthroplasty were discussed at length. The risks and benefits of total hip arthroplasty were presented and reviewed. The risks due to aseptic loosening, infection, stiffness, dislocation/subluxation,  thromboembolic complications and other imponderables were discussed.  The patient acknowledged the explanation, agreed to proceed with the plan and consent was signed. Patient is being admitted for inpatient treatment for surgery, pain control, PT, OT, prophylactic antibiotics, VTE prophylaxis, progressive ambulation and ADL's and discharge planning. The patient is planning to be discharged home with home health services

## 2022-07-16 NOTE — H&P (View-Only) (Signed)
TOTAL HIP REVISION ADMISSION H&P  Patient is admitted for left revision total hip arthroplasty.  Subjective:  Chief Complaint: left hip pain  HPI: Jacqueline Burke, 87 y.o. female, has a history of pain and functional disability in the left hip due to  failed IM nail  and patient has failed non-surgical conservative treatments for greater than 12 weeks to include NSAID's and/or analgesics, use of assistive devices, and activity modification. The indications for the revision total hip arthroplasty are  collapse of left femoral neck .  Onset of symptoms was gradual starting 1 years ago with gradually worsening course since that time.  Prior procedures on the left hip include  IM nail .  Patient currently rates pain in the left hip at 10 out of 10 with activity.  There is night pain, worsening of pain with activity and weight bearing, trendelenberg gait, pain that interfers with activities of daily living, and pain with passive range of motion. Patient has evidence of  collapse of femoral neck  by imaging studies.  This condition presents safety issues increasing the risk of falls.    There is no current active infection.  Patient Active Problem List   Diagnosis Date Noted  . Nocturnal dyspnea 10/22/2021  . COPD (chronic obstructive pulmonary disease) ([facility]) 02/01/2021  . Normochromic normocytic anemia 08/09/2020  . Essential hypertension 08/03/2020  . CKD (chronic kidney disease), stage III ([facility]) 08/03/2020  . Closed intertrochanteric fracture of hip, left, initial encounter ([facility]) 08/03/2020  . Pulmonary fibrosis ([facility]) 08/03/2020  . Cough 03/20/2020  . CAD (coronary artery disease), native coronary artery 08/25/2017  . Hyperlipidemia LDL goal <70 08/25/2017  . NSTEMI (non-ST elevated myocardial infarction) ([facility]) 06/12/2017  . Chronic chest pain   . Ureteral calculus 06/09/2017  . Edema extremities 02/16/2014  . Sinusitis, chronic 11/11/2013  . Dyspnea 11/11/2013  . PVC's (premature ventricular  contractions) 08/19/2013  . Benign hypertensive heart disease without heart failure 08/15/2013   Past Medical History:  Diagnosis Date  . Allergic rhinitis   . CAD (coronary artery disease), native coronary artery    NSTEMI with 95% RCA s/p DES to RCA 06/2017  . Diverticulosis   . GERD (gastroesophageal reflux disease)   . Heart murmur    Systolic heart murmur with Aortic valve sclerosis by ECHO  . History of kidney stones   . Hyperlipidemia   . Hypertension   . Osteoarthritis    Erosive OA-MRI of R hand negative for synovitis, only OA-Dr Hawkes  . Osteopenia   . Pelvic prolapse   . PVC's (premature ventricular contractions)     Past Surgical History:  Procedure Laterality Date  . BUNIONECTOMY  08/2006   R foot and hammer roe right second toe  . CARDIAC CATHETERIZATION    . CARPAL TUNNEL RELEASE  2002   bil hands  . CORONARY STENT INTERVENTION N/A 06/15/2017   Procedure: CORONARY STENT INTERVENTION;  Surgeon: Berry, Jonathan J, MD;  Location: [facility];  Service: Cardiovascular;  Laterality: N/A;  . CYSTOSCOPY     with laser lithotripsy Dr. Bell 07-20-17   . CYSTOSCOPY W/ URETERAL STENT PLACEMENT Left 06/09/2017   Procedure: CYSTOSCOPY WITH RETROGRADE PYELOGRAM/URETERAL STENT PLACEMENT;  Surgeon: Bell, Eugene D III, MD;  Location: [facility];  Service: Urology;  Laterality: Left;  . CYSTOSCOPY WITH RETROGRADE PYELOGRAM, URETEROSCOPY AND STENT PLACEMENT Left 07/20/2017   Procedure: CYSTOSCOPY WITH RETROGRADE PYELOGRAM, LEFT URETEROSCOPY HOLMIUM LASER LITHO  AND STENT EXCHANGE;  Surgeon: Bell, Eugene D III, MD;    Location: [facility];  Service: Urology;  Laterality: Left;  . HOLMIUM LASER APPLICATION Left 07/20/2017   Procedure: HOLMIUM LASER APPLICATION;  Surgeon: Bell, Eugene D III, MD;  Location: [facility];  Service: Urology;  Laterality: Left;  . INTRAMEDULLARY (IM) NAIL INTERTROCHANTERIC Left 08/04/2020   Procedure: INTRAMEDULLARY (IM) NAIL INTERTROCHANTRIC WITH CABLES;  Surgeon: Dean,  Gregory Scott, MD;  Location: [facility];  Service: Orthopedics;  Laterality: Left;  . LEFT HEART CATH AND CORONARY ANGIOGRAPHY N/A 06/15/2017   Procedure: LEFT HEART CATH AND CORONARY ANGIOGRAPHY;  Surgeon: Berry, Jonathan J, MD;  Location: [facility];  Service: Cardiovascular;  Laterality: N/A;  . TOTAL KNEE ARTHROPLASTY Left 08/2007  . TOTAL KNEE ARTHROPLASTY Right 03/2010    Current Outpatient Medications  Medication Sig Dispense Refill Last Dose  . acetaminophen (TYLENOL) 325 MG tablet Take 650 mg by mouth every 8 (eight) hours as needed.     . Albuterol Sulfate (PROAIR RESPICLICK) 108 (90 Base) MCG/ACT AEPB Inhale 1 puff into the lungs daily as needed (wheezing). 1 each 0   . amitriptyline (ELAVIL) 10 MG tablet Take 2 tablets (20 mg total) by mouth at bedtime. Take 2 tabs = 20 mg by mouth at bedtime 60 tablet 0   . amLODipine (NORVASC) 2.5 MG tablet Take 1 tablet (2.5 mg) in addition to your 5 mg tablet daily. (Total 7.5 mg ) 90 tablet 3   . amLODipine (NORVASC) 5 MG tablet TAKE 1 TABLET BY MOUTH ONCE DAILY IN  ADDITION  TO  1  TABLET  OF  2.5  MG  FOR  A  TOTAL  OF  7.5  MG 90 tablet 3   . aspirin EC 81 MG tablet Take 1 tablet (81 mg total) by mouth daily. 30 tablet 0   . atorvastatin (LIPITOR) 80 MG tablet Take 1 tablet (80 mg total) by mouth daily at 6 PM. 90 tablet 3   . bisacodyl (DULCOLAX) 10 MG suppository Place 10 mg rectally as needed for moderate constipation.     . Calcium Polycarbophil (FIBER) 625 MG TABS Take 2 tablets (1,250 mg total) by mouth daily. 60 tablet 0   . cetirizine (ZYRTEC) 10 MG chewable tablet Chew 1 tablet (10 mg total) by mouth daily. 30 tablet 0   . Cholecalciferol (VITAMIN D PO) Take 3,000 Units by mouth daily.      . clopidogrel (PLAVIX) 75 MG tablet Take 1 tablet by mouth once daily 90 tablet 0   . docusate sodium (COLACE) 100 MG capsule Take 200 mg by mouth daily.     . doxycycline (VIBRA-TABS) 100 MG tablet Take 1 tablet (100 mg total) by mouth 2 (two)  times daily. 14 tablet 0   . Ensure (ENSURE) Take 237 mLs by mouth 2 (two) times daily between meals.     . fluticasone (FLONASE) 50 MCG/ACT nasal spray Place 2 sprays into both nostrils daily. 16 g 0   . gabapentin (NEURONTIN) 300 MG capsule Take 2 capsules (600 mg total) by mouth daily. 60 capsule 0   . guaiFENesin (MUCINEX) 600 MG 12 hr tablet Take 1 tablet (600 mg total) by mouth 2 (two) times daily. 60 tablet 0   . Magnesium Hydroxide (MILK OF MAGNESIA PO) Take 30 mLs by mouth. As needed for constipation     . metoprolol tartrate (LOPRESSOR) 25 MG tablet Take 1 tablet (25 mg total) by mouth 2 (two) times daily. 180 tablet 3   . Multiple Vitamin (MULTIVITAMIN WITH MINERALS) TABS   tablet Take 1 tablet by mouth daily.     . PROLIA 60 MG/ML SOSY injection Inject into the skin once.     . Tiotropium Bromide Monohydrate (SPIRIVA RESPIMAT) 1.25 MCG/ACT AERS 2 puffs     . Trospium Chloride 60 MG CP24 Take 1 capsule (60 mg total) by mouth daily. 30 capsule 0    No current facility-administered medications for this visit.   Allergies  Allergen Reactions  . Ciprofloxacin Hives  . Codeine Hives  . Demerol [Meperidine] Hives  . Hydrogen Peroxide Other (See Comments)    White blisters  . Propoxyphene Other (See Comments)    Unknown Other reaction(s): OTHER  . Tramadol Other (See Comments)    "ITCHY ON THE INSIDE"    Social History   Tobacco Use  . Smoking status: Former    Packs/day: 1.00    Years: 25.00    Total pack years: 25.00    Types: Cigarettes    Quit date: 06/02/1993    Years since quitting: 29.1  . Smokeless tobacco: Never  Substance Use Topics  . Alcohol use: No    Family History  Problem Relation Age of Onset  . Stroke Mother   . Diabetes Mother   . Heart attack Mother   . Osteoporosis Father   . Breast cancer Cousin       Review of Systems  Constitutional:  Positive for unexpected weight change.  Respiratory:  Positive for shortness of breath.    Gastrointestinal:  Positive for blood in stool.  Genitourinary:  Positive for frequency.  Musculoskeletal:  Positive for arthralgias.  Hematological:  Bruises/bleeds easily.  All other systems reviewed and are negative.  Objective:  Physical Exam Constitutional:      General: She is not in acute distress.    Appearance: Normal appearance.  HENT:     Head: Normocephalic and atraumatic.  Eyes:     Extraocular Movements: Extraocular movements intact.     Pupils: Pupils are equal, round, and reactive to light.  Cardiovascular:     Rate and Rhythm: Normal rate and regular rhythm.     Pulses: Normal pulses.     Heart sounds: Normal heart sounds.  Pulmonary:     Effort: Pulmonary effort is normal. No respiratory distress.     Breath sounds: Normal breath sounds. No wheezing.  Abdominal:     General: Abdomen is flat. Bowel sounds are normal. There is no distension.     Palpations: Abdomen is soft.     Tenderness: There is no abdominal tenderness.  Musculoskeletal:     Cervical back: Normal range of motion and neck supple.     Left hip: Tenderness and bony tenderness present. Decreased range of motion. Decreased strength.  Lymphadenopathy:     Cervical: No cervical adenopathy.  Skin:    General: Skin is warm and dry.     Findings: No erythema or rash.  Neurological:     General: No focal deficit present.     Mental Status: She is alert and oriented to person, place, and time.  Psychiatric:        Mood and Affect: Mood normal.        Behavior: Behavior normal.   Vital signs in last 24 hours: @VSRANGES@   Labs:   Estimated body mass index is 27.87 kg/m as calculated from the following:   Height as of 07/11/22: 4' 9" (1.448 m).   Weight as of 07/11/22: 58.4 kg.  Imaging Review:  Plain radiographs demonstrate moderate   degenerative joint disease of the left hip(s). The bone quality appears to be fair for age and reported activity level.     Assessment/Plan:  End stage  arthritis, left hip(s) with failed previous IM nail.  The patient history, physical examination, clinical judgement of the provider and imaging studies are consistent with end stage degenerative joint disease of the left hip(s), previous IM nail L hip. Conversion to total hip arthroplasty is deemed medically necessary. The treatment options including medical management, injection therapy, arthroscopy and arthroplasty were discussed at length. The risks and benefits of total hip arthroplasty were presented and reviewed. The risks due to aseptic loosening, infection, stiffness, dislocation/subluxation,  thromboembolic complications and other imponderables were discussed.  The patient acknowledged the explanation, agreed to proceed with the plan and consent was signed. Patient is being admitted for inpatient treatment for surgery, pain control, PT, OT, prophylactic antibiotics, VTE prophylaxis, progressive ambulation and ADL's and discharge planning. The patient is planning to be discharged home with home health services 

## 2022-07-21 ENCOUNTER — Other Ambulatory Visit: Payer: Self-pay

## 2022-07-21 DIAGNOSIS — I2510 Atherosclerotic heart disease of native coronary artery without angina pectoris: Secondary | ICD-10-CM

## 2022-07-21 MED ORDER — ATORVASTATIN CALCIUM 80 MG PO TABS: 80.0000 mg | ORAL_TABLET | Freq: Every day | ORAL | 3 refills | Status: DC

## 2022-07-21 MED ORDER — CLOPIDOGREL BISULFATE 75 MG PO TABS: 75.0000 mg | ORAL_TABLET | Freq: Every day | ORAL | 3 refills | Status: DC

## 2022-07-21 NOTE — Telephone Encounter (Signed)
Pt's medications were sent to pt's pharmacy as requested. Confirmation received.  

## 2022-07-22 ENCOUNTER — Inpatient Hospital Stay: Payer: No Typology Code available for payment source

## 2022-07-22 ENCOUNTER — Inpatient Hospital Stay: Payer: No Typology Code available for payment source | Attending: Oncology | Admitting: Oncology

## 2022-07-22 VITALS — BP 142/69 | HR 94 | Temp 98.1°F | Resp 18 | Ht <= 58 in | Wt 127.0 lb

## 2022-07-22 DIAGNOSIS — D649 Anemia, unspecified: Secondary | ICD-10-CM

## 2022-07-22 DIAGNOSIS — I252 Old myocardial infarction: Secondary | ICD-10-CM | POA: Insufficient documentation

## 2022-07-22 DIAGNOSIS — I120 Hypertensive chronic kidney disease with stage 5 chronic kidney disease or end stage renal disease: Secondary | ICD-10-CM | POA: Diagnosis not present

## 2022-07-22 DIAGNOSIS — J449 Chronic obstructive pulmonary disease, unspecified: Secondary | ICD-10-CM | POA: Insufficient documentation

## 2022-07-22 DIAGNOSIS — K219 Gastro-esophageal reflux disease without esophagitis: Secondary | ICD-10-CM | POA: Diagnosis not present

## 2022-07-22 DIAGNOSIS — I2510 Atherosclerotic heart disease of native coronary artery without angina pectoris: Secondary | ICD-10-CM | POA: Diagnosis not present

## 2022-07-22 DIAGNOSIS — N186 End stage renal disease: Secondary | ICD-10-CM | POA: Insufficient documentation

## 2022-07-22 DIAGNOSIS — M25552 Pain in left hip: Secondary | ICD-10-CM | POA: Diagnosis not present

## 2022-07-22 DIAGNOSIS — J8410 Pulmonary fibrosis, unspecified: Secondary | ICD-10-CM | POA: Diagnosis not present

## 2022-07-22 DIAGNOSIS — M8008XA Age-related osteoporosis with current pathological fracture, vertebra(e), initial encounter for fracture: Secondary | ICD-10-CM | POA: Insufficient documentation

## 2022-07-22 LAB — CBC WITH DIFFERENTIAL (CANCER CENTER ONLY)
Abs Immature Granulocytes: 0.04 10*3/uL (ref 0.00–0.07)
Basophils Absolute: 0 10*3/uL (ref 0.0–0.1)
Basophils Relative: 0 %
Eosinophils Absolute: 0.2 10*3/uL (ref 0.0–0.5)
Eosinophils Relative: 2 %
HCT: 40.1 % (ref 36.0–46.0)
Hemoglobin: 13 g/dL (ref 12.0–15.0)
Immature Granulocytes: 0 %
Lymphocytes Relative: 21 %
Lymphs Abs: 2.2 10*3/uL (ref 0.7–4.0)
MCH: 31 pg (ref 26.0–34.0)
MCHC: 32.4 g/dL (ref 30.0–36.0)
MCV: 95.5 fL (ref 80.0–100.0)
Monocytes Absolute: 0.7 10*3/uL (ref 0.1–1.0)
Monocytes Relative: 6 %
Neutro Abs: 7.6 10*3/uL (ref 1.7–7.7)
Neutrophils Relative %: 71 %
Platelet Count: 330 10*3/uL (ref 150–400)
RBC: 4.2 MIL/uL (ref 3.87–5.11)
RDW: 14.4 % (ref 11.5–15.5)
WBC Count: 10.8 10*3/uL — ABNORMAL HIGH (ref 4.0–10.5)
nRBC: 0 % (ref 0.0–0.2)

## 2022-07-22 LAB — CMP (CANCER CENTER ONLY)
ALT: 14 U/L (ref 0–44)
AST: 16 U/L (ref 15–41)
Albumin: 4.9 g/dL (ref 3.5–5.0)
Alkaline Phosphatase: 28 U/L — ABNORMAL LOW (ref 38–126)
Anion gap: 8 (ref 5–15)
BUN: 18 mg/dL (ref 8–23)
CO2: 28 mmol/L (ref 22–32)
Calcium: 11 mg/dL — ABNORMAL HIGH (ref 8.9–10.3)
Chloride: 103 mmol/L (ref 98–111)
Creatinine: 0.91 mg/dL (ref 0.44–1.00)
GFR, Estimated: >60 mL/min (ref >60)
Glucose, Bld: 93 mg/dL (ref 70–99)
Potassium: 4.1 mmol/L (ref 3.5–5.1)
Sodium: 139 mmol/L (ref 135–145)
Total Bilirubin: 0.4 mg/dL (ref 0.3–1.2)
Total Protein: 7.4 g/dL (ref 6.5–8.1)

## 2022-07-22 LAB — LACTATE DEHYDROGENASE: LDH: 150 U/L (ref 98–192)

## 2022-07-22 NOTE — Progress Notes (Signed)
Jacqueline Burke Consult   Requesting MD: Glenis Smoker, [place],   [id number]   Jacqueline Burke 87 y.o.  Feb 14, 1936    Reason for Consult: Possible infiltrative bone marrow process   HPI: [name] suffered a left femur fracture in 2022 and underwent surgical stabilization.  She reports "hip "and thigh pain for the past few months.  The pain is worse with weightbearing.  She is followed by Dr. Zachery Dakins.  An MRI of the lumbar spine on 06/14/2022 revealed no worrisome osseous lesion or acute fracture in the vertebral bodies.  There is a T11 compression fracture.  There is new heterogenous low T1 marrow signal throughout the visualized left iliac bone which is indeterminate.  No significant paraspinal finding.  Severe left renal cortical thinning and atrophy has progressed from prior studies.  She was referred to Dr. Lindell Noe.  Labs on 06/23/2022 included a serum protein electrophoresis which revealed no monoclonal protein.  The creatinine returned at 0.99, calcium 9.7, albumin 4.4, total protein 6.8, 11.9, platelets 285,000, and white count 6.9.  The serum free kappa light chains returned mildly elevated at 34 with lambda light chains of 22.5  She is referred to consider a diagnosis of multiple myeloma or another malignant disorder.  She reports left hip revision surgery is scheduled for 08/08/2022.    Past Medical History:  Diagnosis Date   Allergic rhinitis    CAD (coronary artery disease), native coronary artery    NSTEMI with 95% RCA s/p DES to RCA 06/2017   Diverticulosis    GERD (gastroesophageal reflux disease)    Heart murmur    Systolic heart murmur with Aortic valve sclerosis by ECHO   History of kidney stones    Hyperlipidemia    Hypertension    Osteoarthritis    Erosive OA-MRI of R hand negative for synovitis, only OA-Dr Trudie Reed   Osteopenia    Pelvic prolapse    PVC's (premature ventricular contractions)      .  Chronic renal insufficiency   .  T11 compression fracture   .  Pulmonary fibrosis  Past Surgical History:  Procedure Laterality Date   BUNIONECTOMY  08/2006   R foot and hammer roe right second toe   CARDIAC CATHETERIZATION     CARPAL TUNNEL RELEASE  2002   bil hands   CORONARY STENT INTERVENTION N/A 06/15/2017   Procedure: CORONARY STENT INTERVENTION;  Surgeon: Lorretta Harp, MD;  Location: [facility];  Service: Cardiovascular;  Laterality: N/A;   CYSTOSCOPY     with laser lithotripsy Dr. Gloriann Loan 07-20-17    CYSTOSCOPY W/ URETERAL STENT PLACEMENT Left 06/09/2017   Procedure: CYSTOSCOPY WITH RETROGRADE PYELOGRAM/URETERAL STENT PLACEMENT;  Surgeon: Lucas Mallow, MD;  Location: [facility];  Service: Urology;  Laterality: Left;   CYSTOSCOPY WITH RETROGRADE PYELOGRAM, URETEROSCOPY AND STENT PLACEMENT Left 07/20/2017   Procedure: CYSTOSCOPY WITH RETROGRADE PYELOGRAM, LEFT URETEROSCOPY HOLMIUM LASER LITHO  AND STENT EXCHANGE;  Surgeon: Lucas Mallow, MD;  Location: [facility];  Service: Urology;  Laterality: Left;   HOLMIUM LASER APPLICATION Left [id number]   Procedure: HOLMIUM LASER APPLICATION;  Surgeon: Lucas Mallow, MD;  Location: [facility];  Service: Urology;  Laterality: Left;   INTRAMEDULLARY (IM) NAIL INTERTROCHANTERIC Left 08/04/2020   Procedure: INTRAMEDULLARY (IM) NAIL INTERTROCHANTRIC WITH CABLES;  Surgeon: Meredith Pel, MD;  Location: [facility];  Service: Orthopedics;  Laterality: Left;   LEFT HEART CATH AND  CORONARY ANGIOGRAPHY N/A 06/15/2017   Procedure: LEFT HEART CATH AND CORONARY ANGIOGRAPHY;  Surgeon: Lorretta Harp, MD;  Location: [facility];  Service: Cardiovascular;  Laterality: N/A;   TOTAL KNEE ARTHROPLASTY Left 08/2007   TOTAL KNEE ARTHROPLASTY Right 03/2010    Medications: Reviewed  Allergies:  Allergies  Allergen Reactions   Ciprofloxacin Hives   Codeine Hives   Demerol [Meperidine] Hives   Hydrogen Peroxide Other (See Comments)     White blisters   Propoxyphene Other (See Comments)    Unknown Other reaction(s): OTHER   Tramadol Other (See Comments)    "ITCHY ON THE INSIDE"    Family history: 2 paternal first cousins had ovarian cancer  Social History:   She lives with her husband in [place].  She is a retired Engineer, manufacturing.  She does not use cigarettes or alcohol.  No transfusion history.  No risk factor for HIV or hepatitis.  ROS:   Positives include: Anorexia and 25-30 pound weight loss over the past several months (she relates weight loss to pain at the left hip), episode of blood in the stool a few days ago, left hip/back pain for the past 2 to 3 months-worse with weightbearing  A complete ROS was otherwise negative.  Physical Exam:  Blood pressure (!) 142/69, pulse 94, temperature 98.1 F (36.7 C), temperature source Oral, resp. rate 18, height 4' 9"$  (1.448 m), weight 127 lb (57.6 kg), SpO2 94 %.  HEENT: Oropharynx without visible mass, neck without mass Lungs: End inspiratory rales at the lower posterior lateral chest bilaterally, no respiratory distress Cardiac: Regular rate and rhythm Abdomen: No hepatosplenomegaly, no mass, nontender  Vascular: No leg edema Lymph nodes: No cervical, supraclavicular, axillary, or inguinal nodes Neurologic: Alert and oriented, the motor exam appears intact in the upper and lower extremities bilaterally Skin: No rash, multiple benign-appearing moles over the trunk Musculoskeletal: No spine tenderness, no tenderness at the left iliac, pain with motion of the left hip, tender at the left trochanter   LAB:  CBC  Lab Results  Component Value Date   WBC 10.8 (H) 07/22/2022   HGB 13.0 07/22/2022   HCT 40.1 07/22/2022   MCV 95.5 07/22/2022   PLT 330 07/22/2022   NEUTROABS 7.6 07/22/2022        CMP  Lab Results  Component Value Date   NA 139 07/22/2022   K 4.1 07/22/2022   CL 103 07/22/2022   CO2 28 07/22/2022   GLUCOSE 93 07/22/2022   BUN 18  07/22/2022   CREATININE 0.91 07/22/2022   CALCIUM 11.0 (H) 07/22/2022   PROT 7.4 07/22/2022   ALBUMIN 4.9 07/22/2022   AST 16 07/22/2022   ALT 14 07/22/2022   ALKPHOS 28 (L) 07/22/2022   BILITOT 0.4 07/22/2022   GFRNONAA >60 07/22/2022   GFRAA 56 (L) 07/17/2017     No results found for: "CEA1", "CAN199", "CA125"  Imaging:  As per HPI I reviewed the 06/14/2022 lumbar MRI images    Assessment/Plan:   Left hip "pain Left femur surgery 2022 for repair of a traumatic fracture MRI lumbar spine 06/14/2022-no acute findings or explanation for symptoms in the lumbar spine, severe T11 compression fracture-stable from prior radiographs, new heterogenous T1 marrow signal throughout the left iliac bone-indeterminate, progressive left renal cortical thinning and atrophy Mild elevation of the serum free kappa light chains T11 compression fracture Pulmonary fibrosis COPD Gastroesophageal reflux disease History of coronary artery disease, status post NSTEMI with a 95% RCA treated with  a DES and PCI in 2019 Osteoporosis Chronic kidney disease   Disposition:   Ms. Burke is referred for oncology evaluation after a lumbar MRI revealed a possible infiltrative bone marrow process at the left iliac bone.  The serum free kappa light chains are mildly elevated.  His serum protein electrophoresis reveals no monoclonal protein.  She has pain at the left hip, presumably related to the need for a left hip replacement in the setting of malfunction of the previously placed left femur hardware.  There is no other clinical evidence of a lymphoproliferative disorder, multiple myeloma, or another malignancy.  She will return to the lab today for a myeloma panel, CBC, and chemistry panel.  I will contact Dr. Zachery Dakins to discuss the case with regard to the need for left hip replacement surgery and his ability to obtain a biopsy at the time of surgery.  She will return for an office visit here within 1  month following surgery.  I am available to see her sooner pending on the laboratory evaluation from today and discussion with orthopedics.  Betsy Coder, MD  07/22/2022, 4:01 PM

## 2022-07-23 LAB — KAPPA/LAMBDA LIGHT CHAINS
Kappa free light chain: 33.1 mg/L — ABNORMAL HIGH (ref 3.3–19.4)
Kappa, lambda light chain ratio: 1.21 (ref 0.26–1.65)
Lambda free light chains: 27.4 mg/L — ABNORMAL HIGH (ref 5.7–26.3)

## 2022-07-24 ENCOUNTER — Telehealth: Payer: Self-pay | Admitting: *Deleted

## 2022-07-24 DIAGNOSIS — D649 Anemia, unspecified: Secondary | ICD-10-CM

## 2022-07-24 NOTE — Telephone Encounter (Signed)
Informed patient that her calcium is elevated and MD wants to repeat this with PTH on 2/23. Scheduled for 10:00 and encouraged her to push po fluids for the lab test. Also MD discussed case with ortho and ortho thinks her pain is from arthritis. Myeloma panel is still pending and if it returns normal, Dr. Benay Spice thinks she should have a bone scan before her hip surgery. Patient agrees with this plan.

## 2022-07-24 NOTE — Progress Notes (Addendum)
PCP - Kara Pacer, MD Cardiologist - Fransico Him, MD clearance 07-10-22 epic PULM- 07-11-22 clearance Baltazar Apo  PPM/ICD -  Device Orders -  Rep Notified -   Chest x-ray -  EKG - 04-10-22 epic Stress Test - 2019 ECHO - 2022 Cardiac Cath - 2019  Sleep Study -  CPAP -   Fasting Blood Sugar -  Checks Blood Sugar _____ times a day  Blood Thinner Instructions:Plavix  hold 5 days last dose 08-03-22 Aspirin Instructions:81 mg asa hold 5 days  ERAS Protcol - PRE-SURGERY Ensure or G2-    COVID vaccine -yes  Activity--Able to complete ADL's without SOB or CP Anesthesia review: murmur, CAD,NSTEMI, mild COPD, HTN   Patient denies shortness of breath, fever, cough and chest pain at PAT appointment   All instructions explained to the patient, with a verbal understanding of the material. Patient agrees to go over the instructions while at home for a better understanding. Patient also instructed to self quarantine after being tested for COVID-19. The opportunity to ask questions was provided.

## 2022-07-24 NOTE — Patient Instructions (Signed)
SURGICAL WAITING ROOM VISITATION  Patients having surgery or a procedure may have no more than 2 support people in the waiting area - these visitors may rotate.    Children under the age of 108 must have an adult with them who is not the patient.  Due to an increase in RSV and influenza rates and associated hospitalizations, children ages 2 and under may not visit patients in [place].  If the patient needs to stay at the hospital during part of their recovery, the visitor guidelines for inpatient rooms apply. Pre-op nurse will coordinate an appropriate time for 1 support person to accompany patient in pre-op.  This support person may not rotate.    Please refer to the [facility] website for the visitor guidelines for Inpatients (after your surgery is over and you are in a regular room).       Your procedure is scheduled on: 08-08-22   Report to [facility] Main Entrance    Report to admitting at      0900  AM   Call this number if you have problems the morning of surgery [phone]   Do not eat food :After Midnight.   After Midnight you may have the following liquids until __0830____ AM/ DAY OF SURGERY  then nothing by mouth  Water Non-Citrus Juices (without pulp, NO RED-Apple, White grape, White cranberry) Black Coffee (NO MILK/CREAM OR CREAMERS, sugar ok)  Clear Tea (NO MILK/CREAM OR CREAMERS, sugar ok) regular and decaf                             Plain Jell-O (NO RED)                                           Fruit ices (not with fruit pulp, NO RED)                                     Popsicles (NO RED)                                                               Sports drinks like Gatorade (NO RED)                   The day of surgery:  Drink ONE (1) Pre-Surgery Clear Ensure at   0850 AM the morning of surgery. Drink in one sitting. Do not sip.  This drink was given to you during your hospital  pre-op appointment visit. Nothing else to drink  after completing the  Pre-Surgery Clear Ensure by 0830 am          If you have questions, please contact your surgeon's office.   FOLLOW BOWEL PREP AND ANY ADDITIONAL PRE OP INSTRUCTIONS YOU RECEIVED FROM YOUR SURGEON'S OFFICE!!!     Oral Hygiene is also important to reduce your risk of infection.  Remember - BRUSH YOUR TEETH THE MORNING OF SURGERY WITH YOUR REGULAR TOOTHPASTE  DENTURES WILL BE REMOVED PRIOR TO SURGERY PLEASE DO NOT APPLY "Poly grip" OR ADHESIVES!!!   Do NOT smoke after Midnight   Take these medicines the morning of surgery with A SIP OF WATER: Trospium chloride, inhaler, metoprolol, zyrtec, amlodipine, bring rescue inhaler with you    Bring CPAP mask and tubing day of surgery.                              You may not have any metal on your body including hair pins, jewelry, and body piercing             Do not wear make-up, lotions, powders, perfumes/cologne, or deodorant  Do not wear nail polish including gel and S&S, artificial/acrylic nails, or any other type of covering on natural nails including finger and toenails. If you have artificial nails, gel coating, etc. that needs to be removed by a nail salon please have this removed prior to surgery or surgery may need to be canceled/ delayed if the surgeon/ anesthesia feels like they are unable to be safely monitored.   Do not shave  48 hours prior to surgery.                Do not bring valuables to the hospital. [place].   Contacts, glasses, dentures or bridgework may not be worn into surgery.   Bring small overnight bag day of surgery.   DO NOT [place]. PHARMACY WILL DISPENSE MEDICATIONS LISTED ON YOUR MEDICATION LIST TO YOU DURING YOUR ADMISSION [place]!    Patients discharged on the day of surgery will not be allowed to drive home.  Someone NEEDS to stay with you for the  first 24 hours after anesthesia.   Special Instructions: Bring a copy of your healthcare power of attorney and living will documents the day of surgery if you haven't scanned them before.              Please read over the following fact sheets you were given: IF [place] [phone]   If you received a COVID test during your pre-op visit  it is requested that you wear a mask when out in public, stay away from anyone that may not be feeling well and notify your surgeon if you develop symptoms. If you test positive for Covid or have been in contact with anyone that has tested positive in the last 10 days please notify you surgeon.    [place] - Preparing for Surgery Before surgery, you can play an important role.  Because skin is not sterile, your skin needs to be as free of germs as possible.  You can reduce the number of germs on your skin by washing with CHG (chlorahexidine gluconate) soap before surgery.  CHG is an antiseptic cleaner which kills germs and bonds with the skin to continue killing germs even after washing. Please DO NOT use if you have an allergy to CHG or antibacterial soaps.  If your skin becomes reddened/irritated stop using the CHG and inform your nurse when you arrive at Short Stay. Do not shave (including legs and underarms) for at least 48 hours prior to the first CHG  shower.  You may shave your face/neck. Please follow these instructions carefully:  1.  Shower with CHG Soap the night before surgery and the  morning of Surgery.  2.  If you choose to wash your hair, wash your hair first as usual with your  normal  shampoo.  3.  After you shampoo, rinse your hair and body thoroughly to remove the  shampoo.                           4.  Use CHG as you would any other liquid soap.  You can apply chg directly  to the skin and wash                       Gently with a scrungie or clean washcloth.  5.  Apply the CHG Soap to  your body ONLY FROM THE NECK DOWN.   Do not use on face/ open                           Wound or open sores. Avoid contact with eyes, ears mouth and genitals (private parts).                       Wash face,  Genitals (private parts) with your normal soap.             6.  Wash thoroughly, paying special attention to the area where your surgery  will be performed.  7.  Thoroughly rinse your body with warm water from the neck down.  8.  DO NOT shower/wash with your normal soap after using and rinsing off  the CHG Soap.                9.  Pat yourself dry with a clean towel.            10.  Wear clean pajamas.            11.  Place clean sheets on your bed the night of your first shower and do not  sleep with pets. Day of Surgery : Do not apply any lotions/deodorants the morning of surgery.  Please wear clean clothes to the hospital/surgery center.  FAILURE TO FOLLOW THESE INSTRUCTIONS MAY RESULT IN THE CANCELLATION OF YOUR SURGERY PATIENT SIGNATURE_________________________________  NURSE SIGNATURE__________________________________  ________________________________________________________________________  Jacqueline Burke  An incentive spirometer is a tool that can help keep your lungs clear and active. This tool measures how well you are filling your lungs with each breath. Taking long deep breaths may help reverse or decrease the chance of developing breathing (pulmonary) problems (especially infection) following: A long period of time when you are unable to move or be active. BEFORE THE PROCEDURE  If the spirometer includes an indicator to show your best effort, your nurse or respiratory therapist will set it to a desired goal. If possible, sit up straight or lean slightly forward. Try not to slouch. Hold the incentive spirometer in an upright position. INSTRUCTIONS FOR USE  Sit on the edge of your bed if possible, or sit up as far as you can in bed or on a chair. Hold the incentive  spirometer in an upright position. Breathe out normally. Place the mouthpiece in your mouth and seal your lips tightly around it. Breathe in slowly and as deeply as possible, raising the piston or the ball toward the  top of the column. Hold your breath for 3-5 seconds or for as long as possible. Allow the piston or ball to fall to the bottom of the column. Remove the mouthpiece from your mouth and breathe out normally. Rest for a few seconds and repeat Steps 1 through 7 at least 10 times every 1-2 hours when you are awake. Take your time and take a few normal breaths between deep breaths. The spirometer may include an indicator to show your best effort. Use the indicator as a goal to work toward during each repetition. After each set of 10 deep breaths, practice coughing to be sure your lungs are clear. If you have an incision (the cut made at the time of surgery), support your incision when coughing by placing a pillow or rolled up towels firmly against it. Once you are able to get out of bed, walk around indoors and cough well. You may stop using the incentive spirometer when instructed by your caregiver.  RISKS AND COMPLICATIONS Take your time so you do not get dizzy or light-headed. If you are in pain, you may need to take or ask for pain medication before doing incentive spirometry. It is harder to take a deep breath if you are having pain. AFTER USE Rest and breathe slowly and easily. It can be helpful to keep track of a log of your progress. Your caregiver can provide you with a simple table to help with this. If you are using the spirometer at home, follow these instructions: [place] IF:  You are having difficultly using the spirometer. You have trouble using the spirometer as often as instructed. Your pain medication is not giving enough relief while using the spirometer. You develop fever of 100.5 F (38.1 C) or higher. SEEK IMMEDIATE MEDICAL CARE IF:  You cough up bloody  sputum that had not been present before. You develop fever of 102 F (38.9 C) or greater. You develop worsening pain at or near the incision site. MAKE SURE YOU:  Understand these instructions. Will watch your condition. Will get help right away if you are not doing well or get worse. Document Released: 09/29/2006 Document Revised: 08/11/2011 Document Reviewed: 11/30/2006 [facility] Patient Information 2014 [place], [place].   ________________________________________________________________________

## 2022-07-24 NOTE — Telephone Encounter (Signed)
-----   Message from Ladell Pier, MD sent at 07/23/2022  8:07 PM EST ----- Please call patient, myeloma panel is pending, calcium is elevated, repeat cmp with PTH 2/24 I spoke to orthopedics, they feel pain is due to hip arthritis Needs a bone scan if myeloma panel is negative, may need to delay hip surgery while iliac lesion is evaluated

## 2022-07-25 ENCOUNTER — Inpatient Hospital Stay: Payer: No Typology Code available for payment source | Attending: Oncology

## 2022-07-25 DIAGNOSIS — D649 Anemia, unspecified: Secondary | ICD-10-CM

## 2022-07-25 DIAGNOSIS — M25552 Pain in left hip: Secondary | ICD-10-CM | POA: Insufficient documentation

## 2022-07-25 DIAGNOSIS — E8581 Light chain (AL) amyloidosis: Secondary | ICD-10-CM | POA: Insufficient documentation

## 2022-07-25 LAB — CMP (CANCER CENTER ONLY)
ALT: 15 U/L (ref 0–44)
AST: 17 U/L (ref 15–41)
Albumin: 4.5 g/dL (ref 3.5–5.0)
Alkaline Phosphatase: 32 U/L — ABNORMAL LOW (ref 38–126)
Anion gap: 9 (ref 5–15)
BUN: 18 mg/dL (ref 8–23)
CO2: 27 mmol/L (ref 22–32)
Calcium: 10.3 mg/dL (ref 8.9–10.3)
Chloride: 105 mmol/L (ref 98–111)
Creatinine: 0.9 mg/dL (ref 0.44–1.00)
GFR, Estimated: >60 mL/min (ref >60)
Glucose, Bld: 93 mg/dL (ref 70–99)
Potassium: 4 mmol/L (ref 3.5–5.1)
Sodium: 141 mmol/L (ref 135–145)
Total Bilirubin: 0.4 mg/dL (ref 0.3–1.2)
Total Protein: 7.5 g/dL (ref 6.5–8.1)

## 2022-07-27 LAB — PTH, INTACT AND CALCIUM
Calcium, Total (PTH): 9.9 mg/dL (ref 8.7–10.3)
PTH: 22 pg/mL (ref 15–65)

## 2022-07-28 ENCOUNTER — Encounter (HOSPITAL_COMMUNITY)
Admission: RE | Admit: 2022-07-28 | Discharge: 2022-07-28 | Disposition: A | Discharge status: Home / Self Care | Payer: No Typology Code available for payment source | Source: Ambulatory Visit | Attending: Orthopedic Surgery | Admitting: Orthopedic Surgery

## 2022-07-28 ENCOUNTER — Other Ambulatory Visit: Payer: Self-pay

## 2022-07-28 ENCOUNTER — Encounter (HOSPITAL_COMMUNITY): Payer: Self-pay

## 2022-07-28 ENCOUNTER — Telehealth: Payer: Self-pay

## 2022-07-28 VITALS — BP 132/72 | HR 59 | Temp 97.9°F | Resp 17 | Ht <= 58 in | Wt 128.0 lb

## 2022-07-28 DIAGNOSIS — Z955 Presence of coronary angioplasty implant and graft: Secondary | ICD-10-CM | POA: Diagnosis not present

## 2022-07-28 DIAGNOSIS — I10 Essential (primary) hypertension: Secondary | ICD-10-CM | POA: Insufficient documentation

## 2022-07-28 DIAGNOSIS — G8929 Other chronic pain: Secondary | ICD-10-CM

## 2022-07-28 DIAGNOSIS — M25552 Pain in left hip: Secondary | ICD-10-CM | POA: Diagnosis not present

## 2022-07-28 DIAGNOSIS — Z01818 Encounter for other preprocedural examination: Secondary | ICD-10-CM

## 2022-07-28 DIAGNOSIS — J449 Chronic obstructive pulmonary disease, unspecified: Secondary | ICD-10-CM | POA: Insufficient documentation

## 2022-07-28 DIAGNOSIS — I252 Old myocardial infarction: Secondary | ICD-10-CM | POA: Insufficient documentation

## 2022-07-28 DIAGNOSIS — Z87891 Personal history of nicotine dependence: Secondary | ICD-10-CM | POA: Diagnosis not present

## 2022-07-28 DIAGNOSIS — I2510 Atherosclerotic heart disease of native coronary artery without angina pectoris: Secondary | ICD-10-CM | POA: Diagnosis not present

## 2022-07-28 DIAGNOSIS — Z01812 Encounter for preprocedural laboratory examination: Secondary | ICD-10-CM | POA: Insufficient documentation

## 2022-07-28 HISTORY — DX: Other complications of anesthesia, initial encounter: T88.59XA

## 2022-07-28 LAB — COMPREHENSIVE METABOLIC PANEL
ALT: 16 U/L (ref 0–44)
AST: 19 U/L (ref 15–41)
Albumin: 4.1 g/dL (ref 3.5–5.0)
Alkaline Phosphatase: 40 U/L (ref 38–126)
Anion gap: 9 (ref 5–15)
BUN: 17 mg/dL (ref 8–23)
CO2: 24 mmol/L (ref 22–32)
Calcium: 9.4 mg/dL (ref 8.9–10.3)
Chloride: 106 mmol/L (ref 98–111)
Creatinine, Ser: 0.85 mg/dL (ref 0.44–1.00)
GFR, Estimated: >60 mL/min (ref >60)
Glucose, Bld: 111 mg/dL — ABNORMAL HIGH (ref 70–99)
Potassium: 4 mmol/L (ref 3.5–5.1)
Sodium: 139 mmol/L (ref 135–145)
Total Bilirubin: 0.5 mg/dL (ref 0.3–1.2)
Total Protein: 7.4 g/dL (ref 6.5–8.1)

## 2022-07-28 LAB — TYPE AND SCREEN
ABO/RH(D): O POS
Antibody Screen: NEGATIVE

## 2022-07-28 LAB — CBC WITH DIFFERENTIAL/PLATELET
Abs Immature Granulocytes: 0.04 10*3/uL (ref 0.00–0.07)
Basophils Absolute: 0 10*3/uL (ref 0.0–0.1)
Basophils Relative: 0 %
Eosinophils Absolute: 0.2 10*3/uL (ref 0.0–0.5)
Eosinophils Relative: 2 %
HCT: 38.9 % (ref 36.0–46.0)
Hemoglobin: 12.2 g/dL (ref 12.0–15.0)
Immature Granulocytes: 0 %
Lymphocytes Relative: 24 %
Lymphs Abs: 2.2 10*3/uL (ref 0.7–4.0)
MCH: 30.8 pg (ref 26.0–34.0)
MCHC: 31.4 g/dL (ref 30.0–36.0)
MCV: 98.2 fL (ref 80.0–100.0)
Monocytes Absolute: 0.6 10*3/uL (ref 0.1–1.0)
Monocytes Relative: 7 %
Neutro Abs: 6 10*3/uL (ref 1.7–7.7)
Neutrophils Relative %: 67 %
Platelets: 292 10*3/uL (ref 150–400)
RBC: 3.96 MIL/uL (ref 3.87–5.11)
RDW: 14.3 % (ref 11.5–15.5)
WBC: 9 10*3/uL (ref 4.0–10.5)
nRBC: 0 % (ref 0.0–0.2)

## 2022-07-28 LAB — MULTIPLE MYELOMA PANEL, SERUM
Albumin SerPl Elph-Mcnc: 4.1 g/dL (ref 2.9–4.4)
Albumin/Glob SerPl: 1.4 (ref 0.7–1.7)
Alpha 1: 0.3 g/dL (ref 0.0–0.4)
Alpha2 Glob SerPl Elph-Mcnc: 0.9 g/dL (ref 0.4–1.0)
B-Globulin SerPl Elph-Mcnc: 1 g/dL (ref 0.7–1.3)
Gamma Glob SerPl Elph-Mcnc: 0.9 g/dL (ref 0.4–1.8)
Globulin, Total: 3.1 g/dL (ref 2.2–3.9)
IgA: 104 mg/dL (ref 64–422)
IgG (Immunoglobin G), Serum: 947 mg/dL (ref 586–1602)
IgM (Immunoglobulin M), Srm: 77 mg/dL (ref 26–217)
Total Protein ELP: 7.2 g/dL (ref 6.0–8.5)

## 2022-07-28 LAB — SURGICAL PCR SCREEN
MRSA, PCR: NEGATIVE
Staphylococcus aureus: NEGATIVE

## 2022-07-28 NOTE — Telephone Encounter (Signed)
Patient gave verbal understanding and had no further questions or concerns  

## 2022-07-28 NOTE — Telephone Encounter (Signed)
-----   Message from Ladell Pier, MD sent at 07/25/2022  4:24 PM EST ----- Please call patient, repeat calcium is normal, follow-up as scheduled

## 2022-07-29 ENCOUNTER — Telehealth: Payer: Self-pay

## 2022-07-29 NOTE — Progress Notes (Addendum)
Anesthesia Chart Review   Case: [id number] Date/Time: 08/08/22 1123   Procedure: CONVERSION TO TOTAL HIP FROM IM NAIL (Left: Hip)   Anesthesia type: General   Pre-op diagnosis: LT HIP FEMORAL HEAD COLLAPSE   Location: [facility] 07 / [facility]   Surgeons: Jacqueline Sheng, MD       DISCUSSION: 87 y.o. former Burke with h/o HTN, CAD s/p NSTEMI with 95% RCA stenosis treated with DES and PCI in 2019, PVCs, COPD, left hip femoral head collapse scheduled for above procedure 08/08/2022 with Dr. Charlies Burke.   Pt last seen by pulmonology 07/11/2022. Per OV note, "Mild obstructive lung disease. She was able to stop her Spiriva without difficulty, never needs her albuterol. In retrospect her shortness of breath seem to be more related to nasal obstruction. No contraindications to surgery, low to moderate risk. It would be beneficial to remember that she has mild COPD in the perioperative period in case she needs bronchodilators. "  Pt last seen by cardiology 07/10/2022. Per OV note, "The patient affirms she has been doing well without any new cardiac symptoms. They are able to achieve 5 METS without cardiac limitations. Therefore, based on ACC/AHA guidelines, the patient would be at acceptable risk for the planned procedure without further cardiovascular testing. The patient was advised that if she develops new symptoms prior to surgery to contact our office to arrange for a follow-up visit, and she verbalized understanding.    Jacqueline Burke perioperative risk of a major cardiac event is 6.6% according to the Revised Cardiac Risk Index (RCRI).  Therefore, she is at high risk for perioperative complications.   Her functional capacity is good at 5.07 METs according to the Duke Activity Status Index (DASI). Recommendations: According to ACC/AHA guidelines, no further cardiovascular testing needed.  The patient may proceed to surgery at acceptable risk.   Antiplatelet and/or Anticoagulation  Recommendations: Clopidogrel (Plavix) can be held for 5 days prior to her surgery and resumed as soon as possible post op.  She will need guidance for holding ASA from her PCP."  Pt reports last dose of Plavix 08/03/2022.   Anticipate pt can proceed with planned procedure barring acute status change.   VS: BP 132/72   Pulse (!) 59   Temp 36.6 C (Oral)   Resp 17   Ht '4\' 9"'$  (1.448 m)   Wt 58.1 kg   SpO2 97%   BMI 27.70 kg/m   PROVIDERS: Jacqueline Smoker, MD is PCP   Primary Cardiologist:  Jacqueline Him, MD   LABS: Labs reviewed: Acceptable for surgery. (all labs ordered are listed, but only abnormal results are displayed)  Labs Reviewed  COMPREHENSIVE METABOLIC PANEL - Abnormal; Notable for the following components:      Result Value   Glucose, Bld 111 (*)    All other components within normal limits  SURGICAL PCR SCREEN  CBC WITH DIFFERENTIAL/PLATELET  TYPE AND SCREEN     IMAGES:   EKG:   CV: Echo 04/23/21 1. Left ventricular ejection fraction by 3D volume is 67 %. The left  ventricle has normal function. The left ventricle has no regional wall  motion abnormalities. Left ventricular diastolic parameters are consistent  with Grade I diastolic dysfunction  (impaired relaxation).   2. Right ventricular systolic function is normal. The right ventricular  size is normal. Tricuspid regurgitation signal is inadequate for assessing  PA pressure.   3. The mitral valve is grossly normal. Trivial mitral valve  regurgitation. No evidence of  mitral stenosis.   4. The aortic valve is grossly normal. There is moderate calcification of  the aortic valve. There is mild thickening of the aortic valve. Aortic  valve regurgitation is not visualized. Aortic valve  sclerosis/calcification is present, without any evidence   of aortic stenosis.   5. The inferior vena cava is normal in size with greater than 50%  respiratory variability, suggesting right atrial pressure of 3  mmHg.   Myocardial Perfusion 11/23/2017 Nuclear stress EF: 70%. Normal perfusion with minimal apical thinning No ischemia or scar Low risk scan   Past Medical History:  Diagnosis Date   Allergic rhinitis    CAD (coronary artery disease), native coronary artery    NSTEMI with 95% RCA s/p DES to RCA [id number]   Complication of anesthesia    makes her confused cursing etc..   Diverticulosis    GERD (gastroesophageal reflux disease)    Heart murmur    Systolic heart murmur with Aortic valve sclerosis by ECHO   History of kidney stones    Hyperlipidemia    Hypertension    Myocardial infarction ([place])    Osteoarthritis    Erosive OA-MRI of R hand negative for synovitis, only OA-Dr Jacqueline Burke   Osteopenia    Pelvic prolapse    PVC's (premature ventricular contractions)     Past Surgical History:  Procedure Laterality Date   BUNIONECTOMY  08/2006   R foot and hammer roe right second toe   CARDIAC CATHETERIZATION     CARPAL TUNNEL RELEASE  2002   bil hands   CORONARY STENT INTERVENTION N/A 06/15/2017   Procedure: CORONARY STENT INTERVENTION;  Surgeon: Jacqueline Harp, MD;  Location: [place] [facility];  Service: Cardiovascular;  Laterality: N/A;   CYSTOSCOPY     with laser lithotripsy Dr. Gloriann Burke 07-20-17    CYSTOSCOPY W/ URETERAL STENT PLACEMENT Left 06/09/2017   Procedure: CYSTOSCOPY WITH RETROGRADE PYELOGRAM/URETERAL STENT PLACEMENT;  Surgeon: Jacqueline Mallow, MD;  Location: [facility];  Service: Urology;  Laterality: Left;   CYSTOSCOPY WITH RETROGRADE PYELOGRAM, URETEROSCOPY AND STENT PLACEMENT Left 07/20/2017   Procedure: CYSTOSCOPY WITH RETROGRADE PYELOGRAM, LEFT URETEROSCOPY HOLMIUM LASER LITHO  AND STENT EXCHANGE;  Surgeon: Jacqueline Mallow, MD;  Location[facility];  Service: Urology;  Laterality: Left;   HOLMIUM LASER APPLICATION Left [id number]   Procedure: HOLMIUM LASER APPLICATION;  Surgeon: Jacqueline Mallow, MD;  Location: [facility];  Service: Urology;  Laterality: Left;    INTRAMEDULLARY (IM) NAIL INTERTROCHANTERIC Left 08/04/2020   Procedure: INTRAMEDULLARY (IM) NAIL INTERTROCHANTRIC WITH CABLES;  Surgeon: Jacqueline Pel, MD;  Location: [facility];  Service: Orthopedics;  Laterality: Left;   LEFT HEART CATH AND CORONARY ANGIOGRAPHY N/A 06/15/2017   Procedure: LEFT HEART CATH AND CORONARY ANGIOGRAPHY;  Surgeon: Jacqueline Harp, MD;  Location: [facility];  Service: Cardiovascular;  Laterality: N/A;   TOTAL KNEE ARTHROPLASTY Left 08/2007   TOTAL KNEE ARTHROPLASTY Right 03/2010    MEDICATIONS:  acetaminophen (TYLENOL) 325 MG tablet   Albuterol Sulfate (PROAIR RESPICLICK) 123XX123 (90 Base) MCG/ACT AEPB   amLODipine (NORVASC) 2.5 MG tablet   amLODipine (NORVASC) 5 MG tablet   aspirin EC 81 MG tablet   atorvastatin (LIPITOR) 80 MG tablet   cetirizine (ZYRTEC) 10 MG chewable tablet   Cholecalciferol (VITAMIN D PO)   clopidogrel (PLAVIX) 75 MG tablet   Cyanocobalamin 5000 MCG TBDP   docusate sodium (COLACE) 100 MG capsule   fluticasone (FLONASE) 50 MCG/ACT nasal spray  gabapentin (NEURONTIN) 300 MG capsule   guaiFENesin (MUCINEX) 600 MG 12 hr tablet   metoprolol tartrate (LOPRESSOR) 25 MG tablet   Multiple Vitamin (MULTIVITAMIN) capsule   PROLIA 60 MG/ML SOSY injection   Tiotropium Bromide Monohydrate (SPIRIVA RESPIMAT) 1.25 MCG/ACT AERS   Trospium Chloride 60 MG CP24   No current facility-administered medications for this encounter.    Jacqueline Felix Ward, PA-C [facility] [phone]

## 2022-07-29 NOTE — Anesthesia Preprocedure Evaluation (Addendum)
Anesthesia Evaluation  Patient identified by MRN, date of birth, ID band Patient awake    Reviewed: Allergy & Precautions, NPO status , Patient's Chart, lab work & pertinent test results, reviewed documented beta blocker date and time   History of Anesthesia Complications (+) PROLONGED EMERGENCE, Emergence Delirium and history of anesthetic complications  Airway Mallampati: II  TM Distance: >3 FB Neck ROM: Full    Dental no notable dental hx.    Pulmonary shortness of breath, COPD, former smoker   Pulmonary exam normal        Cardiovascular hypertension, Pt. on home beta blockers and Pt. on medications + CAD (on Plavix (last 08/03/20)), + Past MI and + Cardiac Stents (2019)  Normal cardiovascular exam+ Valvular Problems/Murmurs   TTE 06/2017: EF 55%, mild LAE, lipomatous hypertrophy of atrial septum, PASP 88mHg    Neuro/Psych negative neurological ROS  negative psych ROS   GI/Hepatic Neg liver ROS,GERD  ,,  Endo/Other  negative endocrine ROS    Renal/GU Renal InsufficiencyRenal disease (Cr 1.24)  negative genitourinary   Musculoskeletal  (+) Arthritis ,    Abdominal   Peds  Hematology  (+) Blood dyscrasia, anemia Hgb 11.7   Anesthesia Other Findings Day of surgery medications reviewed with patient.  Reproductive/Obstetrics negative OB ROS                             Anesthesia Physical Anesthesia Plan  ASA: 2  Anesthesia Plan: Spinal and MAC   Post-op Pain Management:    Induction: Intravenous  PONV Risk Score and Plan: 4 or greater and Treatment may vary due to age or medical condition and Propofol infusion  Airway Management Planned: Natural Airway and Simple Face Mask  Additional Equipment: None  Intra-op Plan:   Post-operative Plan: Extubation in OR  Informed Consent: I have reviewed the patients History and Physical, chart, labs and discussed the procedure including the  risks, benefits and alternatives for the proposed anesthesia with the patient or authorized representative who has indicated his/her understanding and acceptance.     Dental advisory given  Plan Discussed with: CRNA and Anesthesiologist  Anesthesia Plan Comments: (See PAT note 07/28/2022  DISCUSSION: 87y.o. former smoker with h/o HTN, CAD s/p NSTEMI with 95% RCA stenosis treated with DES and PCI in 2019, PVCs, COPD, left hip femoral head collapse scheduled for above procedure 08/08/2022 with Dr. DCharlies Constable    Pt last seen by pulmonology 07/11/2022. Per OV note, "Mild obstructive lung disease. She was able to stop her Spiriva without difficulty, never needs her albuterol. In retrospect her shortness of breath seem to be more related to nasal obstruction. No contraindications to surgery, low to moderate risk. It would be beneficial to remember that she has mild COPD in the perioperative period in case she needs bronchodilators. "   Pt last seen by cardiology 07/10/2022. Per OV note, "The patient affirms she has been doing well without any new cardiac symptoms. They are able to achieve 5 METS without cardiac limitations. Therefore, based on ACC/AHA guidelines, the patient would be at acceptable risk for the planned procedure without further cardiovascular testing. The patient was advised that if she develops new symptoms prior to surgery to contact our office to arrange for a follow-up visit, and she verbalized understanding.    Ms. CMulroneyperioperative risk of a major cardiac event is 6.6% according to the Revised Cardiac Risk Index (RCRI).  Therefore, she is at high  risk for perioperative complications.   Her functional capacity is good at 5.07 METs according to the Duke Activity Status Index (DASI). Recommendations: According to ACC/AHA guidelines, no further cardiovascular testing needed.  The patient may proceed to surgery at acceptable risk.   Antiplatelet and/or Anticoagulation  Recommendations: Clopidogrel (Plavix) can be held for 5 days prior to her surgery and resumed as soon as possible post op.  She will need guidance for holding ASA from her PCP."   Pt reports last dose of Plavix 08/03/2022.   Echo 04/23/21 1. Left ventricular ejection fraction by 3D volume is 67 %. The left  ventricle has normal function. The left ventricle has no regional wall  motion abnormalities. Left ventricular diastolic parameters are consistent  with Grade I diastolic dysfunction  (impaired relaxation).   2. Right ventricular systolic function is normal. The right ventricular  size is normal. Tricuspid regurgitation signal is inadequate for assessing  PA pressure.   3. The mitral valve is grossly normal. Trivial mitral valve  regurgitation. No evidence of mitral stenosis.   4. The aortic valve is grossly normal. There is moderate calcification of  the aortic valve. There is mild thickening of the aortic valve. Aortic  valve regurgitation is not visualized. Aortic valve  sclerosis/calcification is present, without any evidence   of aortic stenosis.   5. The inferior vena cava is normal in size with greater than 50%  respiratory variability, suggesting right atrial pressure of 3 mmHg.    Myocardial Perfusion 11/23/2017  Nuclear stress EF: 70%.  Normal perfusion with minimal apical thinning No ischemia or scar  Low risk scan  )        Anesthesia Quick Evaluation

## 2022-07-29 NOTE — Telephone Encounter (Signed)
-----   Message from Ladell Pier, MD sent at 07/29/2022  8:11 AM EST ----- Please call patient, the myeloma panel is negative, follow-up as scheduled

## 2022-07-29 NOTE — Telephone Encounter (Signed)
Patient gave verbal understanding had no further questions or concerns. 

## 2022-08-06 ENCOUNTER — Other Ambulatory Visit: Payer: Self-pay

## 2022-08-08 ENCOUNTER — Inpatient Hospital Stay (HOSPITAL_COMMUNITY): Payer: No Typology Code available for payment source | Admitting: Certified Registered"

## 2022-08-08 ENCOUNTER — Inpatient Hospital Stay (HOSPITAL_COMMUNITY): Payer: No Typology Code available for payment source

## 2022-08-08 ENCOUNTER — Other Ambulatory Visit: Payer: Self-pay

## 2022-08-08 ENCOUNTER — Encounter (HOSPITAL_COMMUNITY)
Admission: RE | Disposition: A | Discharge status: Home Health | Payer: Self-pay | Source: Ambulatory Visit | Attending: Orthopedic Surgery

## 2022-08-08 ENCOUNTER — Inpatient Hospital Stay (HOSPITAL_COMMUNITY): Payer: No Typology Code available for payment source | Admitting: Physician Assistant

## 2022-08-08 ENCOUNTER — Inpatient Hospital Stay (HOSPITAL_COMMUNITY)
Admission: RE | Admit: 2022-08-08 | Discharge: 2022-08-10 | DRG: 470 | Disposition: A | Discharge status: Home Health | Payer: No Typology Code available for payment source | Source: Ambulatory Visit | Attending: Orthopedic Surgery | Admitting: Orthopedic Surgery

## 2022-08-08 ENCOUNTER — Encounter (HOSPITAL_COMMUNITY): Payer: Self-pay | Admitting: Orthopedic Surgery

## 2022-08-08 DIAGNOSIS — I252 Old myocardial infarction: Secondary | ICD-10-CM | POA: Diagnosis not present

## 2022-08-08 DIAGNOSIS — Z823 Family history of stroke: Secondary | ICD-10-CM | POA: Diagnosis not present

## 2022-08-08 DIAGNOSIS — Z8249 Family history of ischemic heart disease and other diseases of the circulatory system: Secondary | ICD-10-CM

## 2022-08-08 DIAGNOSIS — M1612 Unilateral primary osteoarthritis, left hip: Principal | ICD-10-CM | POA: Diagnosis present

## 2022-08-08 DIAGNOSIS — S72142A Displaced intertrochanteric fracture of left femur, initial encounter for closed fracture: Secondary | ICD-10-CM | POA: Diagnosis not present

## 2022-08-08 DIAGNOSIS — X58XXXS Exposure to other specified factors, sequela: Secondary | ICD-10-CM | POA: Diagnosis present

## 2022-08-08 DIAGNOSIS — J449 Chronic obstructive pulmonary disease, unspecified: Secondary | ICD-10-CM | POA: Diagnosis not present

## 2022-08-08 DIAGNOSIS — Z7902 Long term (current) use of antithrombotics/antiplatelets: Secondary | ICD-10-CM

## 2022-08-08 DIAGNOSIS — Z79899 Other long term (current) drug therapy: Secondary | ICD-10-CM

## 2022-08-08 DIAGNOSIS — Z8262 Family history of osteoporosis: Secondary | ICD-10-CM

## 2022-08-08 DIAGNOSIS — I96 Gangrene, not elsewhere classified: Secondary | ICD-10-CM | POA: Diagnosis not present

## 2022-08-08 DIAGNOSIS — S72142S Displaced intertrochanteric fracture of left femur, sequela: Secondary | ICD-10-CM

## 2022-08-08 DIAGNOSIS — Y798 Miscellaneous orthopedic devices associated with adverse incidents, not elsewhere classified: Secondary | ICD-10-CM | POA: Diagnosis present

## 2022-08-08 DIAGNOSIS — Z888 Allergy status to other drugs, medicaments and biological substances status: Secondary | ICD-10-CM | POA: Diagnosis not present

## 2022-08-08 DIAGNOSIS — I2510 Atherosclerotic heart disease of native coronary artery without angina pectoris: Secondary | ICD-10-CM | POA: Diagnosis not present

## 2022-08-08 DIAGNOSIS — K219 Gastro-esophageal reflux disease without esophagitis: Secondary | ICD-10-CM | POA: Diagnosis present

## 2022-08-08 DIAGNOSIS — T84091A Other mechanical complication of internal left hip prosthesis, initial encounter: Secondary | ICD-10-CM | POA: Diagnosis not present

## 2022-08-08 DIAGNOSIS — Z833 Family history of diabetes mellitus: Secondary | ICD-10-CM

## 2022-08-08 DIAGNOSIS — Z955 Presence of coronary angioplasty implant and graft: Secondary | ICD-10-CM | POA: Diagnosis not present

## 2022-08-08 DIAGNOSIS — I1310 Hypertensive heart and chronic kidney disease without heart failure, with stage 1 through stage 4 chronic kidney disease, or unspecified chronic kidney disease: Secondary | ICD-10-CM | POA: Diagnosis not present

## 2022-08-08 DIAGNOSIS — Z7982 Long term (current) use of aspirin: Secondary | ICD-10-CM

## 2022-08-08 DIAGNOSIS — N183 Chronic kidney disease, stage 3 unspecified: Secondary | ICD-10-CM | POA: Diagnosis not present

## 2022-08-08 DIAGNOSIS — E785 Hyperlipidemia, unspecified: Secondary | ICD-10-CM | POA: Diagnosis present

## 2022-08-08 DIAGNOSIS — M1652 Unilateral post-traumatic osteoarthritis, left hip: Secondary | ICD-10-CM | POA: Diagnosis not present

## 2022-08-08 DIAGNOSIS — M87852 Other osteonecrosis, left femur: Secondary | ICD-10-CM | POA: Diagnosis not present

## 2022-08-08 DIAGNOSIS — Z9181 History of falling: Secondary | ICD-10-CM

## 2022-08-08 DIAGNOSIS — Z96642 Presence of left artificial hip joint: Secondary | ICD-10-CM | POA: Diagnosis not present

## 2022-08-08 DIAGNOSIS — Z471 Aftercare following joint replacement surgery: Secondary | ICD-10-CM | POA: Diagnosis not present

## 2022-08-08 DIAGNOSIS — Z885 Allergy status to narcotic agent status: Secondary | ICD-10-CM

## 2022-08-08 DIAGNOSIS — Z87891 Personal history of nicotine dependence: Secondary | ICD-10-CM

## 2022-08-08 DIAGNOSIS — Z96653 Presence of artificial knee joint, bilateral: Secondary | ICD-10-CM | POA: Diagnosis present

## 2022-08-08 HISTORY — PX: CONVERSION TO TOTAL HIP: SHX5784

## 2022-08-08 SURGERY — CONVERSION, PREVIOUS HIP SURGERY, TO TOTAL HIP ARTHROPLASTY
Anesthesia: Monitor Anesthesia Care | Site: Hip | Laterality: Left

## 2022-08-08 MED ORDER — POVIDONE-IODINE 10 % EX SWAB: 2.0000 | Freq: Once | CUTANEOUS | Status: DC

## 2022-08-08 MED ORDER — ALBUTEROL SULFATE 108 (90 BASE) MCG/ACT IN AEPB: 1.0000 | INHALATION_SPRAY | Freq: Every day | RESPIRATORY_TRACT | Status: DC | PRN

## 2022-08-08 MED ORDER — GABAPENTIN 300 MG PO CAPS: 300.0000 mg | ORAL_CAPSULE | Freq: Every day | ORAL | Status: DC | PRN

## 2022-08-08 MED ORDER — ASPIRIN EC 81 MG PO TBEC: 81.0000 mg | DELAYED_RELEASE_TABLET | Freq: Two times a day (BID) | ORAL | 0 refills | Status: AC

## 2022-08-08 MED ORDER — DOCUSATE SODIUM 100 MG PO CAPS
100.0000 mg | ORAL_CAPSULE | Freq: Two times a day (BID) | ORAL | Status: DC
  Administered 2022-08-08 – 2022-08-10 (×4): 100 mg via ORAL
  Filled 2022-08-08 (×4): qty 1

## 2022-08-08 MED ORDER — KETOROLAC TROMETHAMINE 15 MG/ML IJ SOLN
7.5000 mg | Freq: Four times a day (QID) | INTRAMUSCULAR | Status: AC
  Administered 2022-08-08 – 2022-08-09 (×4): 7.5 mg via INTRAVENOUS
  Filled 2022-08-08 (×4): qty 1

## 2022-08-08 MED ORDER — SENNA 8.6 MG PO TABS
1.0000 | ORAL_TABLET | Freq: Two times a day (BID) | ORAL | Status: DC
  Administered 2022-08-08 – 2022-08-10 (×4): 8.6 mg via ORAL
  Filled 2022-08-08 (×4): qty 1

## 2022-08-08 MED ORDER — OXYCODONE HCL 5 MG PO TABS
ORAL_TABLET | ORAL | Status: AC
  Administered 2022-08-08: 5 mg via ORAL
  Filled 2022-08-08: qty 1

## 2022-08-08 MED ORDER — ONDANSETRON HCL 4 MG PO TABS: 4.0000 mg | ORAL_TABLET | Freq: Four times a day (QID) | ORAL | Status: DC | PRN

## 2022-08-08 MED ORDER — FENTANYL CITRATE PF 50 MCG/ML IJ SOSY
PREFILLED_SYRINGE | INTRAMUSCULAR | Status: AC
  Filled 2022-08-08: qty 1

## 2022-08-08 MED ORDER — MENTHOL 3 MG MT LOZG: 1.0000 | LOZENGE | OROMUCOSAL | Status: DC | PRN

## 2022-08-08 MED ORDER — ACETAMINOPHEN 500 MG PO TABS
1000.0000 mg | ORAL_TABLET | Freq: Four times a day (QID) | ORAL | Status: AC
  Administered 2022-08-08 – 2022-08-09 (×4): 1000 mg via ORAL
  Filled 2022-08-08 (×4): qty 2

## 2022-08-08 MED ORDER — OXYCODONE HCL 5 MG PO TABS: 5.0000 mg | ORAL_TABLET | ORAL | 0 refills | Status: AC | PRN

## 2022-08-08 MED ORDER — BUPIVACAINE LIPOSOME 1.3 % IJ SUSP
INTRAMUSCULAR | Status: DC | PRN
  Administered 2022-08-08: 20 mL

## 2022-08-08 MED ORDER — ALBUTEROL SULFATE (2.5 MG/3ML) 0.083% IN NEBU: 2.5000 mg | INHALATION_SOLUTION | Freq: Every day | RESPIRATORY_TRACT | Status: DC | PRN

## 2022-08-08 MED ORDER — AMLODIPINE BESYLATE 5 MG PO TABS
7.5000 mg | ORAL_TABLET | Freq: Every day | ORAL | Status: DC
  Administered 2022-08-10: 7.5 mg via ORAL
  Filled 2022-08-08 (×2): qty 2

## 2022-08-08 MED ORDER — SODIUM CHLORIDE (PF) 0.9 % IJ SOLN
INTRAMUSCULAR | Status: AC
  Filled 2022-08-08: qty 10

## 2022-08-08 MED ORDER — ONDANSETRON HCL 4 MG/2ML IJ SOLN: 4.0000 mg | Freq: Once | INTRAMUSCULAR | Status: DC | PRN

## 2022-08-08 MED ORDER — EPHEDRINE 5 MG/ML INJ
INTRAVENOUS | Status: AC
  Filled 2022-08-08: qty 5

## 2022-08-08 MED ORDER — CHLORHEXIDINE GLUCONATE 0.12 % MT SOLN
15.0000 mL | Freq: Once | OROMUCOSAL | Status: AC
  Administered 2022-08-08: 15 mL via OROMUCOSAL

## 2022-08-08 MED ORDER — ONDANSETRON HCL 4 MG/2ML IJ SOLN
INTRAMUSCULAR | Status: DC | PRN
  Administered 2022-08-08: 4 mg via INTRAVENOUS

## 2022-08-08 MED ORDER — ATORVASTATIN CALCIUM 40 MG PO TABS
80.0000 mg | ORAL_TABLET | Freq: Every day | ORAL | Status: DC
  Administered 2022-08-08 – 2022-08-09 (×2): 80 mg via ORAL
  Filled 2022-08-08 (×2): qty 2

## 2022-08-08 MED ORDER — ORAL CARE MOUTH RINSE: 15.0000 mL | Freq: Once | OROMUCOSAL | Status: AC

## 2022-08-08 MED ORDER — EPHEDRINE SULFATE (PRESSORS) 50 MG/ML IJ SOLN
INTRAMUSCULAR | Status: DC | PRN
  Administered 2022-08-08 (×2): 10 mg via INTRAVENOUS
  Administered 2022-08-08: 5 mg via INTRAVENOUS

## 2022-08-08 MED ORDER — OXYCODONE HCL 5 MG PO TABS: 5.0000 mg | ORAL_TABLET | Freq: Once | ORAL | Status: AC | PRN

## 2022-08-08 MED ORDER — METHOCARBAMOL 500 MG IVPB - SIMPLE MED
INTRAVENOUS | Status: AC
  Filled 2022-08-08: qty 55

## 2022-08-08 MED ORDER — LACTATED RINGERS IV SOLN: INTRAVENOUS | Status: DC

## 2022-08-08 MED ORDER — OXYCODONE HCL 5 MG/5ML PO SOLN: 5.0000 mg | Freq: Once | ORAL | Status: AC | PRN

## 2022-08-08 MED ORDER — FESOTERODINE FUMARATE ER 4 MG PO TB24
4.0000 mg | ORAL_TABLET | Freq: Every day | ORAL | Status: DC
  Administered 2022-08-09 – 2022-08-10 (×2): 4 mg via ORAL
  Filled 2022-08-08 (×2): qty 1

## 2022-08-08 MED ORDER — METHOCARBAMOL 500 MG IVPB - SIMPLE MED
500.0000 mg | Freq: Four times a day (QID) | INTRAVENOUS | Status: DC | PRN
  Administered 2022-08-08: 500 mg via INTRAVENOUS

## 2022-08-08 MED ORDER — PHENYLEPHRINE HCL (PRESSORS) 10 MG/ML IV SOLN
INTRAVENOUS | Status: AC
  Filled 2022-08-08: qty 1

## 2022-08-08 MED ORDER — FENTANYL CITRATE PF 50 MCG/ML IJ SOSY
25.0000 ug | PREFILLED_SYRINGE | INTRAMUSCULAR | Status: AC | PRN
  Administered 2022-08-08 (×6): 25 ug via INTRAVENOUS

## 2022-08-08 MED ORDER — OXYCODONE HCL 5 MG PO TABS
5.0000 mg | ORAL_TABLET | ORAL | Status: DC | PRN
  Administered 2022-08-08 – 2022-08-10 (×3): 5 mg via ORAL
  Filled 2022-08-08 (×3): qty 1

## 2022-08-08 MED ORDER — BUPIVACAINE IN DEXTROSE 0.75-8.25 % IT SOLN
INTRATHECAL | Status: DC | PRN
  Administered 2022-08-08: 1.5 mL via INTRATHECAL

## 2022-08-08 MED ORDER — 0.9 % SODIUM CHLORIDE (POUR BTL) OPTIME
TOPICAL | Status: DC | PRN
  Administered 2022-08-08: 1000 mL

## 2022-08-08 MED ORDER — ISOPROPYL ALCOHOL 70 % SOLN
Status: AC
  Filled 2022-08-08: qty 480

## 2022-08-08 MED ORDER — PROPOFOL 500 MG/50ML IV EMUL
INTRAVENOUS | Status: DC | PRN
  Administered 2022-08-08: 100 ug/kg/min via INTRAVENOUS

## 2022-08-08 MED ORDER — CEFAZOLIN SODIUM-DEXTROSE 2-4 GM/100ML-% IV SOLN
2.0000 g | INTRAVENOUS | Status: AC
  Administered 2022-08-08: 2 g via INTRAVENOUS
  Filled 2022-08-08: qty 100

## 2022-08-08 MED ORDER — SODIUM CHLORIDE (PF) 0.9 % IJ SOLN
INTRAMUSCULAR | Status: AC
  Filled 2022-08-08: qty 50

## 2022-08-08 MED ORDER — BUPIVACAINE LIPOSOME 1.3 % IJ SUSP: 20.0000 mL | Freq: Once | INTRAMUSCULAR | Status: DC

## 2022-08-08 MED ORDER — DEXAMETHASONE SODIUM PHOSPHATE 4 MG/ML IJ SOLN
INTRAMUSCULAR | Status: DC | PRN
  Administered 2022-08-08: 4 mg via INTRAVENOUS

## 2022-08-08 MED ORDER — ONDANSETRON HCL 4 MG/2ML IJ SOLN
INTRAMUSCULAR | Status: AC
  Filled 2022-08-08: qty 2

## 2022-08-08 MED ORDER — PANTOPRAZOLE SODIUM 40 MG PO TBEC
40.0000 mg | DELAYED_RELEASE_TABLET | Freq: Every day | ORAL | Status: DC
  Administered 2022-08-08 – 2022-08-10 (×3): 40 mg via ORAL
  Filled 2022-08-08 (×3): qty 1

## 2022-08-08 MED ORDER — WATER FOR IRRIGATION, STERILE IR SOLN
Status: DC | PRN
  Administered 2022-08-08: 2000 mL

## 2022-08-08 MED ORDER — METOPROLOL TARTRATE 25 MG PO TABS
25.0000 mg | ORAL_TABLET | Freq: Two times a day (BID) | ORAL | Status: DC
  Administered 2022-08-08 – 2022-08-10 (×3): 25 mg via ORAL
  Filled 2022-08-08 (×4): qty 1

## 2022-08-08 MED ORDER — ASPIRIN 81 MG PO CHEW
81.0000 mg | CHEWABLE_TABLET | Freq: Two times a day (BID) | ORAL | Status: DC
  Administered 2022-08-08 – 2022-08-10 (×4): 81 mg via ORAL
  Filled 2022-08-08 (×4): qty 1

## 2022-08-08 MED ORDER — CLOPIDOGREL BISULFATE 75 MG PO TABS: 75.0000 mg | ORAL_TABLET | Freq: Every day | ORAL | 3 refills | Status: DC

## 2022-08-08 MED ORDER — CLOPIDOGREL BISULFATE 75 MG PO TABS
75.0000 mg | ORAL_TABLET | Freq: Every day | ORAL | Status: DC
  Administered 2022-08-10: 75 mg via ORAL
  Filled 2022-08-08 (×2): qty 1

## 2022-08-08 MED ORDER — UMECLIDINIUM BROMIDE 62.5 MCG/ACT IN AEPB
1.0000 | INHALATION_SPRAY | Freq: Every day | RESPIRATORY_TRACT | Status: DC | PRN
  Filled 2022-08-08: qty 7

## 2022-08-08 MED ORDER — ACETAMINOPHEN 325 MG PO TABS
325.0000 mg | ORAL_TABLET | Freq: Four times a day (QID) | ORAL | Status: DC | PRN
  Administered 2022-08-09: 650 mg via ORAL
  Filled 2022-08-08 (×2): qty 2

## 2022-08-08 MED ORDER — PROPOFOL 10 MG/ML IV BOLUS
INTRAVENOUS | Status: AC
  Filled 2022-08-08: qty 20

## 2022-08-08 MED ORDER — TROSPIUM CHLORIDE ER 60 MG PO CP24: 60.0000 mg | ORAL_CAPSULE | Freq: Every day | ORAL | Status: DC

## 2022-08-08 MED ORDER — ACETAMINOPHEN 160 MG/5ML PO SOLN: 325.0000 mg | ORAL | Status: DC | PRN

## 2022-08-08 MED ORDER — CEFADROXIL 500 MG PO CAPS: 500.0000 mg | ORAL_CAPSULE | Freq: Two times a day (BID) | ORAL | 0 refills | Status: AC

## 2022-08-08 MED ORDER — BUPIVACAINE LIPOSOME 1.3 % IJ SUSP
INTRAMUSCULAR | Status: AC
  Filled 2022-08-08: qty 20

## 2022-08-08 MED ORDER — DIPHENHYDRAMINE HCL 12.5 MG/5ML PO ELIX: 12.5000 mg | ORAL_SOLUTION | ORAL | Status: DC | PRN

## 2022-08-08 MED ORDER — DEXAMETHASONE SODIUM PHOSPHATE 10 MG/ML IJ SOLN
INTRAMUSCULAR | Status: AC
  Filled 2022-08-08: qty 1

## 2022-08-08 MED ORDER — SODIUM CHLORIDE (PF) 0.9 % IJ SOLN
INTRAMUSCULAR | Status: DC | PRN
  Administered 2022-08-08: 60 mL

## 2022-08-08 MED ORDER — LORATADINE 10 MG PO TABS
10.0000 mg | ORAL_TABLET | Freq: Every day | ORAL | Status: DC
  Administered 2022-08-09 – 2022-08-10 (×2): 10 mg via ORAL
  Filled 2022-08-08 (×2): qty 1

## 2022-08-08 MED ORDER — CEFAZOLIN SODIUM-DEXTROSE 2-4 GM/100ML-% IV SOLN
2.0000 g | Freq: Three times a day (TID) | INTRAVENOUS | Status: AC
  Administered 2022-08-08 – 2022-08-10 (×6): 2 g via INTRAVENOUS
  Filled 2022-08-08 (×6): qty 100

## 2022-08-08 MED ORDER — ISOPROPYL ALCOHOL 70 % SOLN
Status: DC | PRN
  Administered 2022-08-08: 1 via TOPICAL

## 2022-08-08 MED ORDER — ACETAMINOPHEN 500 MG PO TABS
1000.0000 mg | ORAL_TABLET | Freq: Once | ORAL | Status: AC
  Administered 2022-08-08: 1000 mg via ORAL
  Filled 2022-08-08: qty 2

## 2022-08-08 MED ORDER — MEPERIDINE HCL 50 MG/ML IJ SOLN: 6.2500 mg | INTRAMUSCULAR | Status: DC | PRN

## 2022-08-08 MED ORDER — ALBUMIN HUMAN 5 % IV SOLN: INTRAVENOUS | Status: DC | PRN

## 2022-08-08 MED ORDER — PROPOFOL 1000 MG/100ML IV EMUL
INTRAVENOUS | Status: AC
  Filled 2022-08-08: qty 100

## 2022-08-08 MED ORDER — METHOCARBAMOL 500 MG PO TABS
500.0000 mg | ORAL_TABLET | Freq: Four times a day (QID) | ORAL | Status: DC | PRN
  Administered 2022-08-09 – 2022-08-10 (×3): 500 mg via ORAL
  Filled 2022-08-08 (×3): qty 1

## 2022-08-08 MED ORDER — ONDANSETRON HCL 4 MG PO TABS: 4.0000 mg | ORAL_TABLET | Freq: Three times a day (TID) | ORAL | 0 refills | Status: AC | PRN

## 2022-08-08 MED ORDER — FENTANYL CITRATE (PF) 100 MCG/2ML IJ SOLN
INTRAMUSCULAR | Status: AC
  Filled 2022-08-08: qty 2

## 2022-08-08 MED ORDER — POLYETHYLENE GLYCOL 3350 17 G PO PACK: 17.0000 g | PACK | Freq: Every day | ORAL | Status: DC | PRN

## 2022-08-08 MED ORDER — SODIUM CHLORIDE 0.9 % IR SOLN
Status: DC | PRN
  Administered 2022-08-08: 1000 mL

## 2022-08-08 MED ORDER — PHENOL 1.4 % MT LIQD: 1.0000 | OROMUCOSAL | Status: DC | PRN

## 2022-08-08 MED ORDER — TRANEXAMIC ACID-NACL 1000-0.7 MG/100ML-% IV SOLN
1000.0000 mg | INTRAVENOUS | Status: AC
  Administered 2022-08-08: 1000 mg via INTRAVENOUS
  Filled 2022-08-08: qty 100

## 2022-08-08 MED ORDER — ACETAMINOPHEN 325 MG PO TABS
325.0000 mg | ORAL_TABLET | ORAL | Status: DC | PRN
  Administered 2022-08-08: 500 mg via ORAL

## 2022-08-08 MED ORDER — PHENYLEPHRINE HCL-NACL 20-0.9 MG/250ML-% IV SOLN
INTRAVENOUS | Status: DC | PRN
  Administered 2022-08-08: 30 ug/min via INTRAVENOUS

## 2022-08-08 MED ORDER — ONDANSETRON HCL 4 MG/2ML IJ SOLN: 4.0000 mg | Freq: Four times a day (QID) | INTRAMUSCULAR | Status: DC | PRN

## 2022-08-08 MED ORDER — FENTANYL CITRATE (PF) 100 MCG/2ML IJ SOLN
INTRAMUSCULAR | Status: DC | PRN
  Administered 2022-08-08 (×2): 50 ug via INTRAVENOUS

## 2022-08-08 MED ORDER — ACETAMINOPHEN 500 MG PO TABS
ORAL_TABLET | ORAL | Status: AC
  Filled 2022-08-08: qty 1

## 2022-08-08 MED ORDER — CELECOXIB 100 MG PO CAPS: 100.0000 mg | ORAL_CAPSULE | Freq: Two times a day (BID) | ORAL | 0 refills | Status: AC

## 2022-08-08 MED ORDER — HYDROMORPHONE HCL 1 MG/ML IJ SOLN: 0.5000 mg | INTRAMUSCULAR | Status: DC | PRN

## 2022-08-08 MED ORDER — TIOTROPIUM BROMIDE MONOHYDRATE 1.25 MCG/ACT IN AERS: 2.0000 | INHALATION_SPRAY | Freq: Every day | RESPIRATORY_TRACT | Status: DC | PRN

## 2022-08-08 SURGICAL SUPPLY — 80 items
ADH SKN CLS APL DERMABOND .7 (GAUZE/BANDAGES/DRESSINGS) ×1
APL PRP STRL LF DISP 70% ISPRP (MISCELLANEOUS) ×2
BAG COUNTER SPONGE SURGICOUNT (BAG) IMPLANT
BAG DECANTER FOR FLEXI CONT (MISCELLANEOUS) ×2 IMPLANT
BAG SPEC THK2 15X12 ZIP CLS (MISCELLANEOUS) ×1
BAG SPNG CNTER NS LX DISP (BAG) ×1
BAG ZIPLOCK 12X15 (MISCELLANEOUS) ×2 IMPLANT
BLADE SAW SAG 25X90X1.19 (BLADE) IMPLANT
BLADE SAW SGTL 81X20 HD (BLADE) ×2 IMPLANT
CHLORAPREP W/TINT 26 (MISCELLANEOUS) ×4 IMPLANT
CNTNR URN SCR LID CUP LEK RST (MISCELLANEOUS) IMPLANT
CONT SPEC 4OZ STRL OR WHT (MISCELLANEOUS)
COVER SURGICAL LIGHT HANDLE (MISCELLANEOUS) ×2 IMPLANT
DERMABOND ADVANCED .7 DNX12 (GAUZE/BANDAGES/DRESSINGS) ×2 IMPLANT
DRAPE 3/4 80X56 (DRAPES) ×4 IMPLANT
DRAPE C-ARM 42X120 X-RAY (DRAPES) ×2 IMPLANT
DRAPE HIP W/POCKET STRL (MISCELLANEOUS) ×2 IMPLANT
DRAPE INCISE IOBAN 66X45 STRL (DRAPES) ×2 IMPLANT
DRAPE INCISE IOBAN 85X60 (DRAPES) ×2 IMPLANT
DRAPE POUCH INSTRU U-SHP 10X18 (DRAPES) ×2 IMPLANT
DRAPE U-SHAPE 47X51 STRL (DRAPES) ×2 IMPLANT
DRESSING AQUACEL AG SP 3.5X10 (GAUZE/BANDAGES/DRESSINGS) ×2 IMPLANT
DRSG AQUACEL AG ADV 3.5X10 (GAUZE/BANDAGES/DRESSINGS) IMPLANT
DRSG AQUACEL AG SP 3.5X10 (GAUZE/BANDAGES/DRESSINGS) ×1
ELECT BLADE TIP CTD 4 INCH (ELECTRODE) ×2 IMPLANT
ELECT NDL TIP 2.8 STRL (NEEDLE) ×2 IMPLANT
ELECT NEEDLE TIP 2.8 STRL (NEEDLE) ×1 IMPLANT
ELECT REM PT RETURN 15FT ADLT (MISCELLANEOUS) ×2 IMPLANT
GLOVE BIO SURGEON STRL SZ 6.5 (GLOVE) ×4 IMPLANT
GLOVE BIOGEL PI IND STRL 6.5 (GLOVE) ×2 IMPLANT
GLOVE BIOGEL PI IND STRL 8 (GLOVE) ×2 IMPLANT
GLOVE SURG ORTHO 8.0 STRL STRW (GLOVE) ×4 IMPLANT
GOWN STRL REUS W/ TWL XL LVL3 (GOWN DISPOSABLE) ×2 IMPLANT
GOWN STRL REUS W/TWL XL LVL3 (GOWN DISPOSABLE) ×1
HANDPIECE INTERPULSE COAX TIP (DISPOSABLE)
HEAD FEMORAL HIP (Head) IMPLANT
HOLDER FOLEY CATH W/STRAP (MISCELLANEOUS) ×2 IMPLANT
HOOD PEEL AWAY T7 (MISCELLANEOUS) ×6 IMPLANT
INSERT 0 DEGREE 36 (Miscellaneous) IMPLANT
JET LAVAGE IRRISEPT WOUND (IRRIGATION / IRRIGATOR) ×1
KIT BASIN OR (CUSTOM PROCEDURE TRAY) ×2 IMPLANT
KIT TURNOVER KIT A (KITS) IMPLANT
LAVAGE JET IRRISEPT WOUND (IRRIGATION / IRRIGATOR) IMPLANT
MANIFOLD NEPTUNE II (INSTRUMENTS) ×2 IMPLANT
MARKER SKIN DUAL TIP RULER LAB (MISCELLANEOUS) ×2 IMPLANT
NDL HYPO 22X1.5 SAFETY MO (MISCELLANEOUS) IMPLANT
NEEDLE HYPO 22X1.5 SAFETY MO (MISCELLANEOUS) IMPLANT
NEEDLE SAFETY HYPO 22GAX1.5 (MISCELLANEOUS)
NS IRRIG 1000ML POUR BTL (IV SOLUTION) ×2 IMPLANT
PACK TOTAL JOINT (CUSTOM PROCEDURE TRAY) ×2 IMPLANT
PROTECTOR NERVE ULNAR (MISCELLANEOUS) ×2 IMPLANT
REST MOD HIP SYS SZ23 +0 V40 (Hips) ×1 IMPLANT
RETRIEVER SUT HEWSON (MISCELLANEOUS) ×2 IMPLANT
SCREW HEX LP 6.5X20 (Screw) IMPLANT
SCREW HEX LP 6.5X35 (Screw) IMPLANT
SEALER BIPOLAR AQUA 6.0 (INSTRUMENTS) ×2 IMPLANT
SET HNDPC FAN SPRY TIP SCT (DISPOSABLE) IMPLANT
SHELL TRIDENT II CLUST 50 (Shell) IMPLANT
SLEEVE CABLE 2MM VT (Orthopedic Implant) IMPLANT
SOLUTION IRRIG SURGIPHOR (IV SOLUTION) ×2 IMPLANT
SPIKE FLUID TRANSFER (MISCELLANEOUS) ×6 IMPLANT
STEM STRAIGHT CON 17X155MM (Stem) IMPLANT
SUCTION FRAZIER HANDLE 12FR (TUBING) ×1
SUCTION TUBE FRAZIER 12FR DISP (TUBING) ×2 IMPLANT
SUT BONE WAX W31G (SUTURE) ×2 IMPLANT
SUT ETHIBOND #5 BRAIDED 30INL (SUTURE) ×2 IMPLANT
SUT MNCRL AB 3-0 PS2 18 (SUTURE) ×2 IMPLANT
SUT STRATAFIX 0 PDS 27 VIOLET (SUTURE) ×1
SUT STRATAFIX PDO 1 14 VIOLET (SUTURE) ×1
SUT STRATFX PDO 1 14 VIOLET (SUTURE) ×1
SUT VIC AB 2-0 CT2 27 (SUTURE) ×4 IMPLANT
SUTURE STRATFX 0 PDS 27 VIOLET (SUTURE) ×2 IMPLANT
SUTURE STRATFX PDO 1 14 VIOLET (SUTURE) ×2 IMPLANT
SYR 20ML LL LF (SYRINGE) ×4 IMPLANT
SYSTEM REST MOD HIP SZ23+0 V40 (Hips) IMPLANT
TOWEL OR 17X26 10 PK STRL BLUE (TOWEL DISPOSABLE) ×2 IMPLANT
TRAY FOLEY MTR SLVR 16FR STAT (SET/KITS/TRAYS/PACK) ×2 IMPLANT
TUBE SUCTION HIGH CAP CLEAR NV (SUCTIONS) ×2 IMPLANT
UNDERPAD 30X36 HEAVY ABSORB (UNDERPADS AND DIAPERS) ×2 IMPLANT
WATER STERILE IRR 1000ML POUR (IV SOLUTION) ×4 IMPLANT

## 2022-08-08 NOTE — Evaluation (Signed)
Physical Therapy Evaluation Patient Details Name: Jacqueline Burke MRN: [id number] DOB: [birth year] Today's Date: 08/08/2022  History of Present Illness  87 yo female presents to therapy s/p L THA posterior approach on 08/08/2022 due to failed IM nail (2022) and conservative measures. Pt is currently LLE 50% WB and no posterior hip precautions. Pt has PMH including but not limited to: COPD, HTN, anemia, CKD III, pulmonary fibrosis, CAD, NSTEMI, and B TKA.  Clinical Impression      Jacqueline Burke is a 87 y.o. female POD 0 s/p L THA. Patient reports mod I with mobility at baseline. Patient is now limited by functional impairments (see PT problem list below) and is LLE 50% WB. Pt in bed when PT arrived pt agreeable to therapy intervention, pt indicated 3/10 pain at rest and pain escalated with LLE WB and c/o dizziness with supine to sit and negative findings for orthostatic hypotension.   Pt requires min A for bed mobility and min A for transfers. Patient was able to ambulate 4 feet with RW and min A level of assist cues and assist to maintain LLE 50% WB status and gait limited due to pain report and fatigue. Patient instructed in exercise to facilitate ROM and circulation to manage edema. Pt left in recliner, all needs addressed and family present.  Patient will benefit from continued skilled PT interventions to address impairments and progress towards PLOF. Acute PT will follow to progress mobility and stair training in preparation for safe discharge home.    Recommendations for follow up therapy are one component of a multi-disciplinary discharge planning process, led by the attending physician.  Recommendations may be updated based on patient status, additional functional criteria and insurance authorization.  Follow Up Recommendations Home health PT      Assistance Recommended at Discharge Frequent or constant Supervision/Assistance  Patient can return home with the following  A little help with walking  and/or transfers;A little help with bathing/dressing/bathroom;Assistance with cooking/housework;Assist for transportation;Help with stairs or ramp for entrance    Equipment Recommendations None recommended by PT (pt has DME in home setting)  Recommendations for Other Services       Functional Status Assessment Patient has had a recent decline in their functional status and demonstrates the ability to make significant improvements in function in a reasonable and predictable amount of time.     Precautions / Restrictions Precautions Precautions: Fall Restrictions Weight Bearing Restrictions: Yes LLE Weight Bearing: Partial weight bearing LLE Partial Weight Bearing Percentage or Pounds: 50      Mobility  Bed Mobility Overal bed mobility: Needs Assistance Bed Mobility: Supine to Sit     Supine to sit: Min assist     General bed mobility comments: increased time, HOB elevated and use of bed rails (pt indicated dizziness when seated EOB negative findings for orthostatic hypotension with BP seated 117/76 and standing 116/82)    Transfers Overall transfer level: Needs assistance Equipment used: Rolling walker (2 wheels) Transfers: Sit to/from Stand Sit to Stand: Min assist           General transfer comment: cues for proper UE placement and ongoing t/o eval on LLE WB stauts    Ambulation/Gait Ambulation/Gait assistance: Min assist Gait Distance (Feet): 4 Feet Assistive device: Rolling walker (2 wheels) Gait Pattern/deviations: Step-to pattern, Decreased stance time - left, Antalgic Gait velocity: decreased     General Gait Details: pt required cues for maitaining LLE 50% WB status and min A with L stance phase  to assist to maintain, at end of eval PT provided pt with youth RW and anticipate pt will have improved abiltiy with B UE WB to offload LLE with gait tasks.  Stairs            Wheelchair Mobility    Modified Rankin (Stroke Patients Only)        Balance Overall balance assessment: Needs assistance Sitting-balance support: Feet supported Sitting balance-Leahy Scale: Fair     Standing balance support: Reliant on assistive device for balance, During functional activity Standing balance-Leahy Scale: Poor                               Pertinent Vitals/Pain Pain Assessment Pain Assessment: 0-10 Pain Score: 8  (3/10 at rest and increased with gait trials) Pain Location: L hip Pain Descriptors / Indicators: Aching, Penetrating, Discomfort Pain Intervention(s): Limited activity within patient's tolerance, Monitored during session, Premedicated before session, Ice applied    Home Living Family/patient expects to be discharged to:: Private residence Living Arrangements: Spouse/significant other Available Help at Discharge: Family Type of Home: House Home Access: Stairs to enter Entrance Stairs-Rails: Right Entrance Stairs-Number of Steps: 2   Home Layout: Two level;Able to live on main level with bedroom/bathroom (sunken den with 3 steps to access and an additional step to access the kitchen pt indicates she has not been able to navigate t/o home for the past 6 wks.) Home Equipment: Conservation officer, nature (2 wheels);Rollator (4 wheels);Cane - single point;Shower seat      Prior Function Prior Level of Function : Independent/Modified Independent             Mobility Comments: mod I at rollator level, limited household ambulator, mod I for ADL and self care tasks, husband assists with IADLs and driving.       Hand Dominance        Extremity/Trunk Assessment        Lower Extremity Assessment Lower Extremity Assessment: LLE deficits/detail LLE Deficits / Details: ankle DF/PF 4/5 LLE Sensation: WNL       Communication   Communication: No difficulties  Cognition Arousal/Alertness: Awake/alert Behavior During Therapy: WFL for tasks assessed/performed Overall Cognitive Status: Within Functional Limits for  tasks assessed                                          General Comments General comments (skin integrity, edema, etc.): daughter and husband present during eval    Exercises Total Joint Exercises Ankle Circles/Pumps: AROM, Both, 20 reps   Assessment/Plan    PT Assessment Patient needs continued PT services  PT Problem List Decreased strength;Decreased range of motion;Decreased activity tolerance;Decreased balance;Decreased mobility;Decreased coordination;Decreased knowledge of use of DME;Pain       PT Treatment Interventions DME instruction;Gait training;Stair training;Functional mobility training;Therapeutic activities;Therapeutic exercise;Balance training;Neuromuscular re-education;Modalities;Patient/family education    PT Goals (Current goals can be found in the Care Plan section)  Acute Rehab PT Goals Patient Stated Goal: to go home and be able to put weight on my LLE so I can navigate steps to the den PT Goal Formulation: With patient Time For Goal Achievement: 08/22/22 Potential to Achieve Goals: Good    Frequency 7X/week     Co-evaluation               AM-PAC PT "6 Clicks" Mobility  Outcome Measure  Help needed turning from your back to your side while in a flat bed without using bedrails?: A Little Help needed moving from lying on your back to sitting on the side of a flat bed without using bedrails?: A Little Help needed moving to and from a bed to a chair (including a wheelchair)?: A Little Help needed standing up from a chair using your arms (e.g., wheelchair or bedside chair)?: A Little Help needed to walk in hospital room?: A Lot Help needed climbing 3-5 steps with a railing? : Total 6 Click Score: 15    End of Session Equipment Utilized During Treatment: Gait belt       PT Visit Diagnosis: Unsteadiness on feet (R26.81);Other abnormalities of gait and mobility (R26.89);Muscle weakness (generalized) (M62.81);Difficulty in walking, not  elsewhere classified (R26.2);Pain Pain - Right/Left: Left Pain - part of body: Hip    Time: [id number] PT Time Calculation (min) (ACUTE ONLY): 39 min   Charges:   PT Evaluation $PT Eval Low Complexity: 1 Low PT Treatments $Therapeutic Activity: 23-37 mins        Baird Lyons, PT   Adair Patter 08/08/2022, 5:53 PM

## 2022-08-08 NOTE — Anesthesia Procedure Notes (Addendum)
Spinal  Patient location during procedure: OR Start time: 08/08/2022 7:45 AM End time: 08/08/2022 7:50 AM Reason for block: surgical anesthesia Staffing Anesthesiologist: Janeece Riggers, MD Performed by: Janeece Riggers, MD Authorized by: Janeece Riggers, MD   Preanesthetic Checklist Completed: patient identified, IV checked, site marked, risks and benefits discussed, surgical consent, monitors and equipment checked, pre-op evaluation and timeout performed Spinal Block Patient position: sitting Prep: DuraPrep Patient monitoring: heart rate, cardiac monitor, continuous pulse ox and blood pressure Approach: midline Location: L3-4 Injection technique: single-shot Needle Needle type: Sprotte  Needle gauge: 24 G Needle length: 9 cm Assessment Sensory level: T4 Events: CSF return

## 2022-08-08 NOTE — Interval H&P Note (Signed)
The patient has been re-examined, and the chart reviewed, and there have been no interval changes to the documented history and physical.    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 Removal of IMN.  The operative side was examined and the patient was confirmed to have sensation to DPN, SPN, TN intact, Motor EHL, ext, flex 5/5, and DP 2+, PT 2+, No significant edema.   The risks, benefits, and alternatives have been discussed at length with patient, and the patient is willing to proceed.  Left hip marked. Consent has been signed.

## 2022-08-08 NOTE — Discharge Instructions (Signed)
INSTRUCTIONS AFTER JOINT REPLACEMENT   Remove items at home which could result in a fall. This includes throw rugs or furniture in walking pathways ICE to the affected joint every three hours while awake for 30 minutes at a time, for at least the first 3-5 days, and then as needed for pain and swelling.  Continue to use ice for pain and swelling. You may notice swelling that will progress down to the foot and ankle.  This is normal after surgery.  Elevate your leg when you are not up walking on it.   Continue to use the breathing machine you got in the hospital (incentive spirometer) which will help keep your temperature down.  It is common for your temperature to cycle up and down following surgery, especially at night when you are not up moving around and exerting yourself.  The breathing machine keeps your lungs expanded and your temperature down.   DIET:  As you were doing prior to hospitalization, we recommend a well-balanced diet.  DRESSING / WOUND CARE / SHOWERING  Keep the surgical dressing until follow up.  The dressing is water proof, so you can shower without any extra covering.  IF THE DRESSING FALLS OFF or the wound gets wet inside, change the dressing with sterile gauze.  Please use good hand washing techniques before changing the dressing.  Do not use any lotions or creams on the incision until instructed by your surgeon.    ACTIVITY  Increase activity slowly as tolerated, but follow the weight bearing instructions below.   No driving for 6 weeks or until further direction given by your physician.  You cannot drive while taking narcotics.  No lifting or carrying greater than 10 lbs. until further directed by your surgeon. Avoid periods of inactivity such as sitting longer than an hour when not asleep. This helps prevent blood clots.  You may return to work once you are authorized by your doctor.     WEIGHT BEARING   50% Weight bearing with assist device (walker, cane, etc) as  directed, use it as long as suggested by your surgeon or therapist, typically at least 4-6 weeks.   EXERCISES  Results after joint replacement surgery are often greatly improved when you follow the exercise, range of motion and muscle strengthening exercises prescribed by your doctor. Safety measures are also important to protect the joint from further injury. Any time any of these exercises cause you to have increased pain or swelling, decrease what you are doing until you are comfortable again and then slowly increase them. If you have problems or questions, call your caregiver or physical therapist for advice.   Rehabilitation is important following a joint replacement. After just a few days of immobilization, the muscles of the leg can become weakened and shrink (atrophy).  These exercises are designed to build up the tone and strength of the thigh and leg muscles and to improve motion. Often times heat used for twenty to thirty minutes before working out will loosen up your tissues and help with improving the range of motion but do not use heat for the first two weeks following surgery (sometimes heat can increase post-operative swelling).   These exercises can be done on a training (exercise) mat, on the floor, on a table or on a bed. Use whatever works the best and is most comfortable for you.    Use music or television while you are exercising so that the exercises are a pleasant break in your day.  This will make your life better with the exercises acting as a break in your routine that you can look forward to.   Perform all exercises about fifteen times, three times per day or as directed.  You should exercise both the operative leg and the other leg as well.  Exercises include:   Quad Sets - Tighten up the muscle on the front of the thigh (Quad) and hold for 5-10 seconds.   Straight Leg Raises - With your knee straight (if you were given a brace, keep it on), lift the leg to 60 degrees, hold  for 3 seconds, and slowly lower the leg.  Perform this exercise against resistance later as your leg gets stronger.  Leg Slides: Lying on your back, slowly slide your foot toward your buttocks, bending your knee up off the floor (only go as far as is comfortable). Then slowly slide your foot back down until your leg is flat on the floor again.  Angel Wings: Lying on your back spread your legs to the side as far apart as you can without causing discomfort.  Hamstring Strength:  Lying on your back, push your heel against the floor with your leg straight by tightening up the muscles of your buttocks.  Repeat, but this time bend your knee to a comfortable angle, and push your heel against the floor.  You may put a pillow under the heel to make it more comfortable if necessary.   A rehabilitation program following joint replacement surgery can speed recovery and prevent re-injury in the future due to weakened muscles. Contact your doctor or a physical therapist for more information on knee rehabilitation.    CONSTIPATION  Constipation is defined medically as fewer than three stools per week and severe constipation as less than one stool per week.  Even if you have a regular bowel pattern at home, your normal regimen is likely to be disrupted due to multiple reasons following surgery.  Combination of anesthesia, postoperative narcotics, change in appetite and fluid intake all can affect your bowels.   YOU MUST use at least one of the following options; they are listed in order of increasing strength to get the job done.  They are all available over the counter, and you may need to use some, POSSIBLY even all of these options:    Drink plenty of fluids (prune juice may be helpful) and high fiber foods Colace 100 mg by mouth twice a day  Senokot for constipation as directed and as needed Dulcolax (bisacodyl), take with full glass of water  Miralax (polyethylene glycol) once or twice a day as needed.  If  you have tried all these things and are unable to have a bowel movement in the first 3-4 days after surgery call either your surgeon or your primary doctor.    If you experience loose stools or diarrhea, hold the medications until you stool forms back up.  If your symptoms do not get better within 1 week or if they get worse, check with your doctor.  If you experience "the worst abdominal pain ever" or develop nausea or vomiting, please contact the office immediately for further recommendations for treatment.   ITCHING:  If you experience itching with your medications, try taking only a single pain pill, or even half a pain pill at a time.  You can also use Benadryl over the counter for itching or also to help with sleep.   TED HOSE STOCKINGS:  Use stockings on both legs  until for at least 2 weeks or as directed by physician office. They may be removed at night for sleeping.  MEDICATIONS:  See your medication summary on the "After Visit Summary" that nursing will review with you.  You may have some home medications which will be placed on hold until you complete the course of blood thinner medication.  It is important for you to complete the blood thinner medication as prescribed.   Blood clot prevention (DVT Prophylaxis): After surgery you are at an increased risk for a blood clot. you were prescribed a blood thinner, Aspirin '81mg'$ , to be taken twice daily for a total of 4 weeks from surgery to help reduce your risk of getting a blood clot. This will help prevent a blood clot. Signs of a pulmonary embolus (blood clot in the lungs) include sudden short of breath, feeling lightheaded or dizzy, chest pain with a deep breath, rapid pulse rapid breathing. Signs of a blood clot in your arms or legs include new unexplained swelling and cramping, warm, red or darkened skin around the painful area. Please call the office or 911 right away if these signs or symptoms develop.  PRECAUTIONS:  If you experience  chest pain or shortness of breath - call 911 immediately for transfer to the hospital emergency department.   If you develop a fever greater that 101 F, purulent drainage from wound, increased redness or drainage from wound, foul odor from the wound/dressing, or calf pain - CONTACT YOUR SURGEON.                                                   FOLLOW-UP APPOINTMENTS:  If you do not already have a post-op appointment, please call the office for an appointment to be seen by your surgeon.  Guidelines for how soon to be seen are listed in your "After Visit Summary", but are typically between 2-3 weeks after surgery.   POST-OPERATIVE OPIOID TAPER INSTRUCTIONS: It is important to wean off of your opioid medication as soon as possible. If you do not need pain medication after your surgery it is ok to stop day one. Opioids include: Codeine, Hydrocodone(Norco, Vicodin), Oxycodone(Percocet, oxycontin) and hydromorphone amongst others.  Long term and even short term use of opiods can cause: Increased pain response Dependence Constipation Depression Respiratory depression And more.  Withdrawal symptoms can include Flu like symptoms Nausea, vomiting And more Techniques to manage these symptoms Hydrate well Eat regular healthy meals Stay active Use relaxation techniques(deep breathing, meditating, yoga) Do Not substitute Alcohol to help with tapering If you have been on opioids for less than two weeks and do not have pain than it is ok to stop all together.  Plan to wean off of opioids This plan should start within one week post op of your joint replacement. Maintain the same interval or time between taking each dose and first decrease the dose.  Cut the total daily intake of opioids by one tablet each day Next start to increase the time between doses. The last dose that should be eliminated is the evening dose.   MAKE SURE YOU:  Understand these instructions.  Get help right away if you are  not doing well or get worse.    Thank you for letting us be a part of your medical care team.  It is a privilege we  respect greatly.  We hope these instructions will help you stay on track for a fast and full recovery!

## 2022-08-08 NOTE — Anesthesia Postprocedure Evaluation (Signed)
Anesthesia Post Note  Patient: Jacqueline Burke  Procedure(s) Performed: CONVERSION TO TOTAL HIP FROM IM NAIL (Left: Hip)     Patient location during evaluation: PACU Anesthesia Type: MAC and Spinal Level of consciousness: awake and alert Pain management: pain level controlled Vital Signs Assessment: post-procedure vital signs reviewed and stable Respiratory status: spontaneous breathing, nonlabored ventilation, respiratory function stable and patient connected to nasal cannula oxygen Cardiovascular status: stable and blood pressure returned to baseline Postop Assessment: no apparent nausea or vomiting Anesthetic complications: no   No notable events documented.  Last Vitals:  Vitals:   08/08/22 1215 08/08/22 1230  BP: 134/64 (!) 130/96  Pulse: 73 74  Resp: 12 16  Temp:    SpO2: 96% 98%    Last Pain:  Vitals:   08/08/22 1215  TempSrc:   PainSc: 4                  Beaulah Romanek

## 2022-08-08 NOTE — Transfer of Care (Signed)
Immediate Anesthesia Transfer of Care Note  Patient: Jacqueline Burke  Procedure(s) Performed: CONVERSION TO TOTAL HIP FROM IM NAIL (Left: Hip)  Patient Location: PACU  Anesthesia Type:Spinal  Level of Consciousness: awake, alert , oriented, and patient cooperative  Airway & Oxygen Therapy: Patient Spontanous Breathing and Patient connected to nasal cannula oxygen  Post-op Assessment: Report given to RN, Post -op Vital signs reviewed and stable, and Patient moving all extremities X 4  Post vital signs: Reviewed and stable  Last Vitals:  Vitals Value Taken Time  BP 124/60 08/08/22 1131  Temp    Pulse 67 08/08/22 1138  Resp 28 08/08/22 1138  SpO2 94 % 08/08/22 1138  Vitals shown include unvalidated device data.  Last Pain:  Vitals:   08/08/22 0601  TempSrc:   PainSc: 0-No pain      Patients Stated Pain Goal: 5 ([id number])  Complications: No notable events documented.

## 2022-08-08 NOTE — Op Note (Signed)
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 female who had ORIF left intertrochanteric femur fracture with submersion nail 2 years ago by Dr. Marlou Sa.  She healed uneventfully.  However and for she has developed progressively worsening pain in the left hip.  X-rays demonstrated severe posttraumatic arthritis with femoral head collapse.  Given severe disabling pain conversion to total of arthroplasty was recommended.  The risks benefits and alternatives were discussed with the patient including but not limited to the risks of nonoperative treatment, versus surgical intervention including infection, bleeding, nerve injury, periprosthetic fracture, the need for revision surgery, dislocation, leg length discrepancy, blood clots, cardiopulmonary complications, morbidity, mortality, among others, and they were willing to proceed.     OPERATIVE REPORT     SURGEON:  Charlies Constable, MD    ASSISTANT: Dorise Bullion, PA-C, (Present throughout the entire procedure,  necessary for completion of procedure in a timely manner, assisting with retraction, instrumentation, and closure)     ANESTHESIA: Spinal  ESTIMATED BLOOD LOSS: 0000000    COMPLICATIONS:  None.   COMPONENTS:   2.65m dall mile cerclage cable.  Stryker Trident 2 TriTanium 50 mm acetabular shell, neutral polyethylene liner, 6.5 hex screws x 2, restoration modular 155 mm x 17 mm distal stem, 23+0 restoration modular proximal body, 36 mm +5 mm ceramic head Implant Name Type Inv. Item Serial No. Manufacturer Lot No. LRB No. Used Action  SLEEVE CABLE 2MM VT - [id number] Implant SLEEVE CABLE 2MM  VT  [employer] [id number] 1 Implanted  SHELL TRIDENT II CLUST 50 - [id number] SHELL TRIDENT II CLUST 50  [employer] [id number] Left 1 Implanted  SCREW HEX LP 6.5X20 - [id number] SCREW HEX LP 6.5X20  [employer] FYL Left 1 Implanted  SCREW HEX LP 6.5X35 - [id number] SCREW HEX LP 6.5X35  [employer] FRLA Left 1 Implanted  INSERT 0 DEGREE 36 - [id number] INSERT 0 DEGREE 36  [employer] [id number] Left 1 Implanted  STEM STRAIGHT CON 17X155MM - [id number] STEM STRAIGHT CON 17X155MM  [employer] [id number] Left 1 Implanted  REST MOD HIP SYS SZ23 +0 V40 - [id number] REST MOD HIP SYS SZ23 +0 V40  [employer] [id number] 1 Implanted  HEAD FEMORAL HIP - [id number] HEAD FEMORAL HIP  [employer] [id number] 1 Implanted      PROCEDURE IN DETAIL:   The patient was met in the holding area and  identified.  The appropriate hip was identified and marked at the operative site.  The patient was then transported to the OR  and  placed under anesthesia.  At that point, the patient was  placed in the lateral decubitus position with the operative side up and  secured to the operating room table  and all bony prominences padded. A subaxillary role was also placed.    The operative lower extremity was prepped from the iliac crest to the distal leg.  Sterile draping was performed.  Preoperative antibiotics, 2 gm of ancef,1 gm of Tranexamic Acid, and 8 mg of Decadron administered. Time out was performed prior to incision.      A routine posterolateral approach was utilized via sharp dissection  carried down to the subcutaneous tissue.  The old incisions from this left measure nail were to anterior so a new incision was made.  Gross bleeders were Bovie coagulated.  The iliotibial band was identified and incised along the length of the skin incision through the glute max fascia.  Charnley retractor was placed with  care to protect the sciatic nerve posteriorly.  With the hip internally rotated, the piriformis tendon was identified and released from the femoral insertion and tagged with a #5 Ethibond.  A capsulotomy was then performed off the femoral insertion and also tagged with a #5 Ethibond.   The hip was carefully atraumatically dislocated and then reduced.  A 2.0 Dall-Miles cable was then placed circumferentially just below the lesser trochanter to avoid risk of possible iatrogenic fracture propagation. The Arthrex nail was then removed.  The old scar distally for the distal interlocks was incised and dissected down to the screws.  Removed without difficulty.  Next we turned our attention to removing the cephallomedullary screw and the nail.  The cephalomedullary screw was removed first and the nail came out without any difficulty. The hip was then dislocated.  The remaining neck was significantly shortened.  A neck cut was made just above the lesser trochanter.  I then exposed the deep acetabulum, cleared out any tissue including the ligamentum teres.  After adequate visualization, I excised the labrum.  I then started reaming with a 44 mm reamer, first medializing to the floor of the cotyloid fossa, and then in the position of the cup aiming towards the greater sciatic notch, matching the version of the transverse acetabular ligament and tucked under the anterior wall. I reamed up to 50 mm reamer with good bony bed preparation and a 50 mm cup was chosen.  The real cup was then impacted into place.  Appropriate version and inclination was confirmed clinically matching their bony anatomy, and also with the use of the jig.  I placed 2 screws in the posterior superior quadrant to augment fixation.  A neutral liner was placed and impacted. It was confirmed to be appropriately seated and the acetabular retractors were removed.    I then prepared the proximal femur using the box cutter, Charnley awl, there was greater  trochanter was significantly overhanging so had to use multiple lateralizing reamers to get a straight shot down the femoral canal.  Fluoroscopy was used intermittently to confirm appropriate position of the reamers down the canal.  Been significant lateralization into the trochanter the posterior aspect of the abductors had to be mostly released off the trochanter.  We then reamed sequentially up to a 17 mm reamer which had good cortical fit and excellent position on fluoroscopy in the AP and lateral position.  We then opened the 17 mm x 1 5 mm distal stem.  This was impacted.  Excellent axial and rotational stability.  Then reamed the proximal body up to a 23 mm proximal body based on preoperative templating.  We initially anteverted the femoral component about 20 degrees and sequentially trialed up to a +5 femoral head which had good restoration of leg length.  Notably there was early impingement in flexion and internal rotation thus we position the proximal body to about 30 to 35 degrees of anteversion.  With a +5 femoral head there was no impingement with full extension and 90 degrees external rotation.  The hip was stable at the position of sleep and with 90 degrees flexion and 80 degrees of internal rotation.  Leg lengths were also clinically assessed in the lateral position and felt to be equal. Intra-Op flatplate was obtained and confirmed appropriate component positions.  Good fill of the femur, and restoration of leg length and offset. No evidence or concern for fracture.  The real proximal body was then opened and impacted.  Secured with the screw.  A +5 mm femoral head was again trialed. The hip was then reduced and taken through a range of motion. There was no impingement with full extension and 90 degrees external rotation.  The hip was stable at the position of sleep and with 90 degrees flexion and 80 degrees of internal rotation. Leg lengths were  again assessed and felt to be restored.  We then  opened, and I impacted the real head ball into place.  The posterior capsule was then closed with #5 Ethibond.  The piriformis was repaired through the base of the abductor tendon using a [place] suture passer.  The posterior half of the abductors were then repaired in a figure-of-eight fashion through the greater trochanter bone.  I then irrigated the hip copiously with dilute Betadine and with normal saline pulse lavage. Periarticular injection was then performed with Exparel.   We repaired the fascia #1 barbed suture, followed by 0 barbed suture for the subcutaneous fat.  Skin was closed with 2-0 Vicryl and 3-0 Monocryl.  Dermabond and Aquacel dressing were applied. The patient was then awakened and returned to PACU in stable and satisfactory condition.  Leg lengths in the supine position were assessed and felt to be clinically equal. There were no complications.  Post op recs: WB: 50% PWB LLE, No formal hip precautions Abx: ancef while in house, dc on cefadroxil '500mg'$  BID given conversion surgery Imaging: PACU pelvis Xray Dressing: Aquacell, keep intact until follow up DVT prophylaxis: Aspirin 81BID starting POD1 Follow up: 2 weeks after surgery for a wound check with Dr. Zachery Dakins at [facility] [place].  Address: [address], [place], [place] [address]  Office Phone: [phone]   Charlies Constable, MD Orthopedic Surgeon

## 2022-08-08 NOTE — TOC Transition Note (Signed)
Transition of Care [facility]) - CM/SW Discharge Note   Patient Details  Name: Jacqueline Burke MRN: [id number] Date of Birth: [birth year]  [facility] CM/SW Contact:  Lennart Pall, LCSW Phone Number: 08/08/2022, 3:22 PM   Clinical Narrative:     Met with pt and confirming she has needed DME at home.   Pt aware MD has placed order for HHPT and no agency preference - referral placed with [facility] (in network with pt's insurance).  No further TOC needs.  Final next level of care: [place] Barriers to Discharge: No Barriers Identified   Patient Goals and CMS Choice      Discharge Placement                         Discharge Plan and Services Additional resources added to the After Visit Summary for                  DME Arranged: N/A DME Agency: NA       HH Arranged: PT HH Agency: [facility] Date [place]: 08/08/22 Time [place]: 1522 Representative spoke with at [facility]: Amy  Social Determinants of Health ([place]) Interventions [place]: No Food Insecurity (07/22/2022)  Housing: Low Risk  (07/22/2022)  Transportation Needs: No Transportation Needs (07/22/2022)  Utilities: Not At Risk (07/22/2022)  Depression (PHQ2-9): Low Risk  (07/22/2022)  Physical Activity: Inactive (10/29/2017)  Stress: No Stress Concern Present (10/29/2017)  Tobacco Use: Medium Risk (08/08/2022)     Readmission Risk Interventions    08/08/2022    3:21 PM  Readmission Risk Prevention Plan  Post Dischage Appt Complete  Medication Screening Complete  Transportation Screening Complete

## 2022-08-09 LAB — CBC
HCT: 25.3 % — ABNORMAL LOW (ref 36.0–46.0)
Hemoglobin: 8.2 g/dL — ABNORMAL LOW (ref 12.0–15.0)
MCH: 31.3 pg (ref 26.0–34.0)
MCHC: 32.4 g/dL (ref 30.0–36.0)
MCV: 96.6 fL (ref 80.0–100.0)
Platelets: 213 10*3/uL (ref 150–400)
RBC: 2.62 MIL/uL — ABNORMAL LOW (ref 3.87–5.11)
RDW: 13.7 % (ref 11.5–15.5)
WBC: 12.8 10*3/uL — ABNORMAL HIGH (ref 4.0–10.5)
nRBC: 0 % (ref 0.0–0.2)

## 2022-08-09 LAB — BASIC METABOLIC PANEL
Anion gap: 9 (ref 5–15)
BUN: 19 mg/dL (ref 8–23)
CO2: 25 mmol/L (ref 22–32)
Calcium: 8.3 mg/dL — ABNORMAL LOW (ref 8.9–10.3)
Chloride: 103 mmol/L (ref 98–111)
Creatinine, Ser: 0.9 mg/dL (ref 0.44–1.00)
GFR, Estimated: >60 mL/min (ref >60)
Glucose, Bld: 158 mg/dL — ABNORMAL HIGH (ref 70–99)
Potassium: 4.3 mmol/L (ref 3.5–5.1)
Sodium: 137 mmol/L (ref 135–145)

## 2022-08-09 NOTE — Progress Notes (Signed)
Physical Therapy Treatment Patient Details Name: Jacqueline Burke MRN: [id number] DOB: 1936/04/22 Today's Date: 08/09/2022   History of Present Illness 87 yo female presents to therapy s/p L THA posterior approach on 08/08/2022 due to failed IM nail (2022) and conservative measures. Pt is currently LLE 50% WB and no posterior hip precautions. Pt has PMH including but not limited to: COPD, HTN, anemia, CKD III, pulmonary fibrosis, CAD, NSTEMI, and B TKA.    PT Comments    Pt very cooperative and progressing with mobility.  Pt up to ambulate limited distance in hall and initiated HEP.  Pt pleased with progress this am.   Recommendations for follow up therapy are one component of a multi-disciplinary discharge planning process, led by the attending physician.  Recommendations may be updated based on patient status, additional functional criteria and insurance authorization.  Follow Up Recommendations  Home health PT     Assistance Recommended at Discharge Frequent or constant Supervision/Assistance  Patient can return home with the following A little help with walking and/or transfers;A little help with bathing/dressing/bathroom;Assistance with cooking/housework;Assist for transportation;Help with stairs or ramp for entrance   Equipment Recommendations  None recommended by PT    Recommendations for Other Services       Precautions / Restrictions Precautions Precautions: Fall Restrictions Weight Bearing Restrictions: Yes LLE Weight Bearing: Partial weight bearing LLE Partial Weight Bearing Percentage or Pounds: 50     Mobility  Bed Mobility Overal bed mobility: Needs Assistance Bed Mobility: Supine to Sit     Supine to sit: Min assist     General bed mobility comments: increased time, HOB elevated and use of bed rails    Transfers Overall transfer level: Needs assistance Equipment used: Rolling walker (2 wheels) Transfers: Sit to/from Stand Sit to Stand: Min assist            General transfer comment: cues for proper UE placement and ongoing t/o eval on LLE WB stauts    Ambulation/Gait Ambulation/Gait assistance: Min assist Gait Distance (Feet): 34 Feet Assistive device: Rolling walker (2 wheels) Gait Pattern/deviations: Step-to pattern, Decreased stance time - left, Antalgic Gait velocity: decreased     General Gait Details: Increased time with cues for sequence, posture, position from RW and [facility]   Stairs             Wheelchair Mobility    Modified Rankin (Stroke Patients Only)       Balance Overall balance assessment: Needs assistance Sitting-balance support: Feet supported Sitting balance-Leahy Scale: Fair     Standing balance support: Reliant on assistive device for balance, During functional activity Standing balance-Leahy Scale: Poor                              Cognition Arousal/Alertness: Awake/alert Behavior During Therapy: WFL for tasks assessed/performed Overall Cognitive Status: Within Functional Limits for tasks assessed                                          Exercises Total Joint Exercises Ankle Circles/Pumps: AROM, Both, 20 reps Quad Sets: AROM, Both, 10 reps, Supine Heel Slides: AAROM, Left, 20 reps, Supine Hip ABduction/ADduction: AAROM, Left, 15 reps, Supine    General Comments        Pertinent Vitals/Pain Pain Assessment Pain Assessment: 0-10 Pain Score: 6  Pain Location: L hip Pain  Descriptors / Indicators: Aching, Discomfort, Sore Pain Intervention(s): Limited activity within patient's tolerance, Monitored during session, Premedicated before session, Ice applied    Home Living                          Prior Function            PT Goals (current goals can now be found in the care plan section) Acute Rehab PT Goals Patient Stated Goal: to go home and be able to put weight on my LLE so I can navigate steps to the den PT Goal Formulation: With  patient Time For Goal Achievement: 08/22/22 Potential to Achieve Goals: Good Progress towards PT goals: Progressing toward goals    Frequency    7X/week      PT Plan Current plan remains appropriate    Co-evaluation              AM-PAC PT "6 Clicks" Mobility   Outcome Measure  Help needed turning from your back to your side while in a flat bed without using bedrails?: A Little Help needed moving from lying on your back to sitting on the side of a flat bed without using bedrails?: A Little Help needed moving to and from a bed to a chair (including a wheelchair)?: A Little Help needed standing up from a chair using your arms (e.g., wheelchair or bedside chair)?: A Little Help needed to walk in hospital room?: A Little Help needed climbing 3-5 steps with a railing? : Total 6 Click Score: 16    End of Session Equipment Utilized During Treatment: Gait belt Activity Tolerance: Patient tolerated treatment well Patient left: in chair;with call bell/phone within reach;with chair alarm set;with family/visitor present Nurse Communication: Mobility status PT Visit Diagnosis: Unsteadiness on feet (R26.81);Other abnormalities of gait and mobility (R26.89);Muscle weakness (generalized) (M62.81);Difficulty in walking, not elsewhere classified (R26.2);Pain Pain - Right/Left: Left Pain - part of body: Hip     Time: [id number] PT Time Calculation (min) (ACUTE ONLY): 30 min  Charges:  $Gait Training: 8-22 mins $Therapeutic Exercise: 8-22 mins                     [place] Pager [phone] Office [phone]    Jannifer Fischler 08/09/2022, 1:45 PM

## 2022-08-09 NOTE — Progress Notes (Signed)
   ORTHOPAEDIC PROGRESS NOTE  s/p Procedure(s): CONVERSION TO TOTAL HIP FROM IM NAIL on 3/8 with Dr. Zachery Dakins  SUBJECTIVE: Reports mild pain about operative site. She had some dizziness with therapy yesterday. She was not able to wean off nasal cannula yesterday. Feels good today. Ready to work with therapy again.   No chest pain. No SOB. No nausea/vomiting. No other complaints.  OBJECTIVE: PE: General: sitting up in hospital bed, NAD LLE: dressing CDI, intact EHL/TA/GSC, endorses distal sensation, warm well perfused foot   Vitals:   08/09/22 0537 08/09/22 1018  BP: (!) 101/54 (!) 98/54  Pulse: (!) 57 (!) 59  Resp: 16 16  Temp: 98.2 F (36.8 C) 98.1 F (36.7 C)  SpO2: 98% 98%     ASSESSMENT: Jacqueline Burke is a 87 y.o. female doing well postoperatively. POD#1  PLAN: Weightbearing: 50% PWB LLE Insicional and dressing care: Reinforce dressings as needed Orthopedic device(s): None Showering: Hold for now VTE prophylaxis: Aspirin 81 mg BID Pain control: PRN pain medications, minimize narcotics as able ABLA: Hgb 8.2. Continue to monitor.  Follow - up plan: 2 weeks after surgery for a wound check with Dr. Zachery Dakins at [facility].  Dispo: TBD. PT recommending home health PT. Hopefully patient will get therapy again today to see how she progresses. She needs to wean off nasal cannula as well. Hgb 8.2 this morning, but patient asymptomatic. May discharge home today versus tomorrow depending on her progress today.  Contact information:  After hours and holidays please check [email or web address] for group call information for [facility]   Noemi Chapel, PA-C 08/09/2022

## 2022-08-09 NOTE — Progress Notes (Signed)
Physical Therapy Treatment Patient Details Name: Jacqueline Burke MRN: [id number] DOB: [birth year] Today's Date: 08/09/2022   History of Present Illness 87 yo female presents to therapy s/p L THA posterior approach on 08/08/2022 due to failed IM nail (2022) and conservative measures. Pt is currently LLE 50% WB and no posterior hip precautions. Pt has PMH including but not limited to: COPD, HTN, anemia, CKD III, pulmonary fibrosis, CAD, NSTEMI, and B TKA.    PT Comments    Pt continues very cooperative and progressing steadily with mobility.  Pt up to bathroom for toileting and hand hygiene at sink and then to hall to ambulate increased distance with good follow through on PWB.  Pt continues to desat on RA with activity - denies SOB and carries on conversation without difficulty but does admit to mild lightheadedness - RN aware.   Recommendations for follow up therapy are one component of a multi-disciplinary discharge planning process, led by the attending physician.  Recommendations may be updated based on patient status, additional functional criteria and insurance authorization.  Follow Up Recommendations  Home health PT     Assistance Recommended at Discharge Frequent or constant Supervision/Assistance  Patient can return home with the following A little help with walking and/or transfers;A little help with bathing/dressing/bathroom;Assistance with cooking/housework;Assist for transportation;Help with stairs or ramp for entrance   Equipment Recommendations  None recommended by PT    Recommendations for Other Services       Precautions / Restrictions Precautions Precautions: Fall Restrictions Weight Bearing Restrictions: Yes LLE Weight Bearing: Partial weight bearing LLE Partial Weight Bearing Percentage or Pounds: 50     Mobility  Bed Mobility Overal bed mobility: Needs Assistance Bed Mobility: Supine to Sit, Sit to Supine     Supine to sit: Min assist Sit to supine: Min  assist   General bed mobility comments: increased time with cues for sequence and use of R LE to self assist    Transfers Overall transfer level: Needs assistance Equipment used: Rolling walker (2 wheels) Transfers: Sit to/from Stand Sit to Stand: Min assist           General transfer comment: cues for proper UE placement and ongoing t/o eval on LLE WB stauts    Ambulation/Gait Ambulation/Gait assistance: Min assist Gait Distance (Feet): 62 Feet (and additional 15' into bathroom) Assistive device: Rolling walker (2 wheels) Gait Pattern/deviations: Step-to pattern, Decreased stance time - left, Antalgic Gait velocity: decreased     General Gait Details: Increased time with cues for sequence, posture, position from RW and [facility]   Stairs             Wheelchair Mobility    Modified Rankin (Stroke Patients Only)       Balance Overall balance assessment: Needs assistance Sitting-balance support: Feet supported Sitting balance-Leahy Scale: Fair     Standing balance support: No upper extremity supported Standing balance-Leahy Scale: Fair                              Cognition Arousal/Alertness: Awake/alert Behavior During Therapy: WFL for tasks assessed/performed Overall Cognitive Status: Within Functional Limits for tasks assessed                                          Exercises Total Joint Exercises Ankle Circles/Pumps: AROM, Both, 20 reps Quad Sets:  AROM, Both, 10 reps, Supine Heel Slides: AAROM, Left, 20 reps, Supine Hip ABduction/ADduction: AAROM, Left, 15 reps, Supine    General Comments        Pertinent Vitals/Pain Pain Assessment Pain Assessment: 0-10 Pain Score: 4  Pain Location: L hip Pain Descriptors / Indicators: Aching, Discomfort, Sore Pain Intervention(s): Monitored during session, Premedicated before session, Limited activity within patient's tolerance    Home Living                           Prior Function            PT Goals (current goals can now be found in the care plan section) Acute Rehab PT Goals Patient Stated Goal: to go home and be able to put weight on my LLE so I can navigate steps to the den PT Goal Formulation: With patient Time For Goal Achievement: 08/22/22 Potential to Achieve Goals: Good Progress towards PT goals: Progressing toward goals    Frequency    7X/week      PT Plan Current plan remains appropriate    Co-evaluation              AM-PAC PT "6 Clicks" Mobility   Outcome Measure  Help needed turning from your back to your side while in a flat bed without using bedrails?: A Little Help needed moving from lying on your back to sitting on the side of a flat bed without using bedrails?: A Little Help needed moving to and from a bed to a chair (including a wheelchair)?: A Little Help needed standing up from a chair using your arms (e.g., wheelchair or bedside chair)?: A Little Help needed to walk in hospital room?: A Little Help needed climbing 3-5 steps with a railing? : Total 6 Click Score: 16    End of Session Equipment Utilized During Treatment: Gait belt Activity Tolerance: Patient tolerated treatment well Patient left: in bed;with call bell/phone within reach;with bed alarm set;with nursing/sitter in room;with family/visitor present Nurse Communication: Mobility status PT Visit Diagnosis: Unsteadiness on feet (R26.81);Other abnormalities of gait and mobility (R26.89);Muscle weakness (generalized) (M62.81);Difficulty in walking, not elsewhere classified (R26.2);Pain Pain - Right/Left: Left Pain - part of body: Hip     Time: [id number] PT Time Calculation (min) (ACUTE ONLY): 25 min  Charges:  $Gait Training: 8-22 mins $Therapeutic Exercise: 8-22 mins $Therapeutic Activity: 8-22 mins                     [place] Pager [phone] Office  [phone]    [facility] 08/09/2022, 4:37 PM

## 2022-08-10 LAB — CBC
HCT: 25.5 % — ABNORMAL LOW (ref 36.0–46.0)
Hemoglobin: 8.1 g/dL — ABNORMAL LOW (ref 12.0–15.0)
MCH: 31.3 pg (ref 26.0–34.0)
MCHC: 31.8 g/dL (ref 30.0–36.0)
MCV: 98.5 fL (ref 80.0–100.0)
Platelets: 231 10*3/uL (ref 150–400)
RBC: 2.59 MIL/uL — ABNORMAL LOW (ref 3.87–5.11)
RDW: 14.1 % (ref 11.5–15.5)
WBC: 13.2 10*3/uL — ABNORMAL HIGH (ref 4.0–10.5)
nRBC: 0 % (ref 0.0–0.2)

## 2022-08-10 NOTE — Progress Notes (Signed)
Physical Therapy Treatment Patient Details Name: Jacqueline Burke MRN: [id number] DOB: Aug 11, 1935 Today's Date: 08/10/2022   History of Present Illness 87 yo female presents to therapy s/p L THA posterior approach on 08/08/2022 due to failed IM nail (2022) and conservative measures. Pt is currently LLE 50% WB and no posterior hip precautions. Pt has PMH including but not limited to: COPD, HTN, anemia, CKD III, pulmonary fibrosis, CAD, NSTEMI, and B TKA.    PT Comments    Pt continues very motivated and up to ambulate increased distance in hall, negotiated stairs and reviewed car transfers.  Pt maintaining O2 sats 92% or higher on RA.  Pt and spouse eager for dc home this date.   Recommendations for follow up therapy are one component of a multi-disciplinary discharge planning process, led by the attending physician.  Recommendations may be updated based on patient status, additional functional criteria and insurance authorization.  Follow Up Recommendations  Home health PT     Assistance Recommended at Discharge Frequent or constant Supervision/Assistance  Patient can return home with the following A little help with walking and/or transfers;A little help with bathing/dressing/bathroom;Assistance with cooking/housework;Assist for transportation;Help with stairs or ramp for entrance   Equipment Recommendations  None recommended by PT    Recommendations for Other Services       Precautions / Restrictions Precautions Precautions: Fall Restrictions Weight Bearing Restrictions: Yes LLE Weight Bearing: Partial weight bearing LLE Partial Weight Bearing Percentage or Pounds: 50     Mobility  Bed Mobility Overal bed mobility: Needs Assistance Bed Mobility: Supine to Sit     Supine to sit: Min assist     General bed mobility comments: Pt up in chair and requests back to same    Transfers Overall transfer level: Needs assistance Equipment used: Rolling walker (2 wheels) Transfers:  Sit to/from Stand Sit to Stand: Supervision           General transfer comment: cues for LE management and use of UEs to self assist    Ambulation/Gait Ambulation/Gait assistance: Min guard, Supervision Gait Distance (Feet): 75 Feet Assistive device: Rolling walker (2 wheels) Gait Pattern/deviations: Step-to pattern, Decreased stance time - left, Antalgic Gait velocity: decreased     General Gait Details: min cues for sequence, posture, position from RW and PWB   Stairs Stairs: Yes Stairs assistance: Min assist Stair Management: No rails, One rail Right, Step to pattern, Backwards, Forwards, With cane, With walker Number of Stairs: 5 General stair comments: single step twice bkwd with RW; 3 steps fwd with rail and cane; cues for sequence   Wheelchair Mobility    Modified Rankin (Stroke Patients Only)       Balance Overall balance assessment: Needs assistance Sitting-balance support: Feet supported Sitting balance-Leahy Scale: Good     Standing balance support: No upper extremity supported Standing balance-Leahy Scale: Fair                              Cognition Arousal/Alertness: Awake/alert Behavior During Therapy: WFL for tasks assessed/performed Overall Cognitive Status: Within Functional Limits for tasks assessed                                          Exercises Total Joint Exercises Ankle Circles/Pumps: AROM, Both, 20 reps Quad Sets: AROM, Both, 10 reps, Supine Heel Slides: AAROM, Left,  20 reps, Supine Hip ABduction/ADduction: AAROM, Left, 15 reps, Supine    General Comments        Pertinent Vitals/Pain Pain Assessment Pain Assessment: 0-10 Pain Score: 3  Pain Location: L hip Pain Descriptors / Indicators: Aching, Discomfort, Sore Pain Intervention(s): Limited activity within patient's tolerance, Monitored during session, Ice applied    Home Living                          Prior Function             PT Goals (current goals can now be found in the care plan section) Acute Rehab PT Goals Patient Stated Goal: to go home and be able to put weight on my LLE so I can navigate steps to the den PT Goal Formulation: With patient Time For Goal Achievement: 08/22/22 Potential to Achieve Goals: Good Progress towards PT goals: Progressing toward goals    Frequency    7X/week      PT Plan Current plan remains appropriate    Co-evaluation              AM-PAC PT "6 Clicks" Mobility   Outcome Measure  Help needed turning from your back to your side while in a flat bed without using bedrails?: A Little Help needed moving from lying on your back to sitting on the side of a flat bed without using bedrails?: A Little Help needed moving to and from a bed to a chair (including a wheelchair)?: A Little Help needed standing up from a chair using your arms (e.g., wheelchair or bedside chair)?: A Little Help needed to walk in hospital room?: A Little Help needed climbing 3-5 steps with a railing? : A Little 6 Click Score: 18    End of Session Equipment Utilized During Treatment: Gait belt Activity Tolerance: Patient tolerated treatment well Patient left: in chair;with call bell/phone within reach;with family/visitor present Nurse Communication: Mobility status PT Visit Diagnosis: Unsteadiness on feet (R26.81);Other abnormalities of gait and mobility (R26.89);Muscle weakness (generalized) (M62.81);Difficulty in walking, not elsewhere classified (R26.2);Pain Pain - Right/Left: Left Pain - part of body: Hip     Time: 1357-1430 PT Time Calculation (min) (ACUTE ONLY): 33 min  Charges:  $Gait Training: 8-22 mins $Therapeutic Activity: 8-22 mins                     [place] Pager [phone] Office [phone]    Daysha Ashmore 08/10/2022, 4:18 PM

## 2022-08-10 NOTE — Progress Notes (Signed)
Physical Therapy Treatment Patient Details Name: Jacqueline Burke MRN: [id number] DOB: December 09, 1935 Today's Date: 08/10/2022   History of Present Illness 87 yo female presents to therapy s/p L THA posterior approach on 08/08/2022 due to failed IM nail (2022) and conservative measures. Pt is currently LLE 50% WB and no posterior hip precautions. Pt has PMH including but not limited to: COPD, HTN, anemia, CKD III, pulmonary fibrosis, CAD, NSTEMI, and B TKA.    PT Comments    Pt continues very cooperative and progressing steadily with mobility.  Pt performed HEP with assist; up to bathroom for toileting and hand hygiene standing at sink; ambulated in hall, and negotiated stairs.  Pt maintained sats on RA at 89% or higher and denies significant SOB.  Recommendations for follow up therapy are one component of a multi-disciplinary discharge planning process, led by the attending physician.  Recommendations may be updated based on patient status, additional functional criteria and insurance authorization.  Follow Up Recommendations  Home health PT     Assistance Recommended at Discharge Frequent or constant Supervision/Assistance  Patient can return home with the following A little help with walking and/or transfers;A little help with bathing/dressing/bathroom;Assistance with cooking/housework;Assist for transportation;Help with stairs or ramp for entrance   Equipment Recommendations  None recommended by PT    Recommendations for Other Services       Precautions / Restrictions Precautions Precautions: Fall Restrictions Weight Bearing Restrictions: Yes LLE Weight Bearing: Partial weight bearing LLE Partial Weight Bearing Percentage or Pounds: 50     Mobility  Bed Mobility Overal bed mobility: Needs Assistance Bed Mobility: Supine to Sit     Supine to sit: Min assist     General bed mobility comments: increased time with cues for sequence and use of R LE to self assist     Transfers Overall transfer level: Needs assistance Equipment used: Rolling walker (2 wheels) Transfers: Sit to/from Stand Sit to Stand: Min guard           General transfer comment: cues for LE management and use of UEs to self assist    Ambulation/Gait Ambulation/Gait assistance: Min assist, Min guard Gait Distance (Feet): 60 Feet (and 15' into bathroom) Assistive device: Rolling walker (2 wheels) Gait Pattern/deviations: Step-to pattern, Decreased stance time - left, Antalgic Gait velocity: decreased     General Gait Details: Increased time with cues for sequence, posture, position from RW and PWB   Stairs Stairs: Yes Stairs assistance: Min assist Stair Management: No rails, One rail Right, Step to pattern, Backwards, Forwards, With cane, With walker Number of Stairs: 5 General stair comments: single step twice bkwd with RW; 3 steps fwd with rail and cane; cues for sequence   Wheelchair Mobility    Modified Rankin (Stroke Patients Only)       Balance Overall balance assessment: Needs assistance Sitting-balance support: Feet supported Sitting balance-Leahy Scale: Good     Standing balance support: No upper extremity supported Standing balance-Leahy Scale: Fair                              Cognition Arousal/Alertness: Awake/alert Behavior During Therapy: WFL for tasks assessed/performed Overall Cognitive Status: Within Functional Limits for tasks assessed                                          Exercises  Total Joint Exercises Ankle Circles/Pumps: AROM, Both, 20 reps Quad Sets: AROM, Both, 10 reps, Supine Heel Slides: AAROM, Left, 20 reps, Supine Hip ABduction/ADduction: AAROM, Left, 15 reps, Supine    General Comments        Pertinent Vitals/Pain Pain Assessment Pain Assessment: 0-10 Pain Score: 3  Pain Location: L hip Pain Descriptors / Indicators: Aching, Discomfort, Sore Pain Intervention(s): Limited  activity within patient's tolerance, Monitored during session, Premedicated before session, Ice applied    Home Living                          Prior Function            PT Goals (current goals can now be found in the care plan section) Acute Rehab PT Goals Patient Stated Goal: to go home and be able to put weight on my LLE so I can navigate steps to the den PT Goal Formulation: With patient Time For Goal Achievement: 08/22/22 Potential to Achieve Goals: Good Progress towards PT goals: Progressing toward goals    Frequency    7X/week      PT Plan Current plan remains appropriate    Co-evaluation              AM-PAC PT "6 Clicks" Mobility   Outcome Measure  Help needed turning from your back to your side while in a flat bed without using bedrails?: A Little Help needed moving from lying on your back to sitting on the side of a flat bed without using bedrails?: A Little Help needed moving to and from a bed to a chair (including a wheelchair)?: A Little Help needed standing up from a chair using your arms (e.g., wheelchair or bedside chair)?: A Little Help needed to walk in hospital room?: A Little Help needed climbing 3-5 steps with a railing? : A Little 6 Click Score: 18    End of Session Equipment Utilized During Treatment: Gait belt Activity Tolerance: Patient tolerated treatment well Patient left: in chair;with call bell/phone within reach;with family/visitor present Nurse Communication: Mobility status PT Visit Diagnosis: Unsteadiness on feet (R26.81);Other abnormalities of gait and mobility (R26.89);Muscle weakness (generalized) (M62.81);Difficulty in walking, not elsewhere classified (R26.2);Pain Pain - Right/Left: Left Pain - part of body: Hip     Time: 0942-1020 PT Time Calculation (min) (ACUTE ONLY): 38 min  Charges:  $Gait Training: 8-22 mins $Therapeutic Exercise: 8-22 mins $Therapeutic Activity: 8-22 mins                     [place] Pager [phone] Office [phone]    [facility] 08/10/2022, 12:39 PM

## 2022-08-10 NOTE — Progress Notes (Signed)
   ORTHOPAEDIC PROGRESS NOTE  s/p Procedure(s): CONVERSION TO TOTAL HIP FROM IM NAIL on 3/8 with Dr. Zachery Dakins  SUBJECTIVE: Reports mild pain about operative site. Feels good today. Did well with therapy yesterday but continues to desat on room air with activity. Patient states she feels that this is her baseline. She does not notice any shortness of breath or dizziness. Hgb is slightly lower today, but again patient states she feels normal. She feels comfortable discharging home but also wants to be safe in her discharge.   No chest pain. No SOB. No nausea/vomiting. No other complaints.  OBJECTIVE: PE: General: sitting up in hospital bed, NAD LLE: dressing CDI, intact EHL/TA/GSC, endorses distal sensation, warm well perfused foot   Vitals:   08/10/22 0355 08/10/22 0453  BP:  133/66  Pulse:  70  Resp:  18  Temp:  98.2 F (36.8 C)  SpO2: 92% 92%     ASSESSMENT: Jacqueline Burke is a 87 y.o. female doing well postoperatively. POD#2  PLAN: Weightbearing: 50% PWB LLE Insicional and dressing care: Reinforce dressings as needed Orthopedic device(s): None Showering: Hold for now VTE prophylaxis: Aspirin 81 mg BID Pain control: PRN pain medications, minimize narcotics as able ABLA: Hgb 8.1. Continue to monitor.  Follow - up plan: 2 weeks after surgery for a wound check with Dr. Zachery Dakins at [facility].  Dispo: PT recommending home health PT. Hopefully patient will get therapy again today to see how she progresses. She needs to wean off nasal cannula as well. Hgb 8.1 this morning, but patient asymptomatic. Patient does not feel short of breath. She feels like she is "normal" and at her baseline. We will see how therapy goes today and possibly discharge home later today. Patient and her daughter feel comfortable with this plan.   Contact information:  After hours and holidays please check [email or web address] for group call information for [facility]   Noemi Chapel,  PA-C 08/10/2022

## 2022-08-11 NOTE — Discharge Summary (Signed)
Patient ID: Jacqueline Burke MRN: [id number] DOB/AGE: 1936-01-08 87 y.o.  Admit date: 08/08/2022 Discharge date: 08/10/2022  Admission Diagnoses: Left hip posttraumatic arthritis after intertrochanteric fracture fixed with cephallomedullary nail   Discharge Diagnoses:  Principal Problem:   Osteoarthritis of left hip, unspecified osteoarthritis type   Past Medical History:  Diagnosis Date   Allergic rhinitis    CAD (coronary artery disease), native coronary artery    NSTEMI with 95% RCA s/p DES to RCA [id number]   Complication of anesthesia    makes her confused cursing etc..   Diverticulosis    GERD (gastroesophageal reflux disease)    Heart murmur    Systolic heart murmur with Aortic valve sclerosis by ECHO   History of kidney stones    Hyperlipidemia    Hypertension    Myocardial infarction (HCC)    Osteoarthritis    Erosive OA-MRI of R hand negative for synovitis, only OA-Dr Trudie Reed   Osteopenia    Pelvic prolapse    PVC's (premature ventricular contractions)      Procedures Performed: Removal of cephallomedullary nail with conversion to total hip arthroplasty  Discharged Condition: stable  Hospital Course:  ASJA STAKE is an 87 y.o. female who had ORIF left intertrochanteric femur fracture with submersion nail 2 years ago by Dr. Marlou Sa.  She healed uneventfully.  However and for she has developed progressively worsening pain in the left hip.  X-rays demonstrated severe posttraumatic arthritis with femoral head collapse.  Given severe disabling pain conversion to total of arthroplasty was recommended. She was brought to [facility] for scheduled surgery on 08/08/22. She tolerated procedure well.  She was kept for monitoring for pain control, medical monitoring postop, PT evaluation, and discharge planning. She was found to be stable for DC home the morning after surgery.  Patient was instructed on specific activity restrictions and all questions were answered.  Consults:  PT  Significant Diagnostic Studies: No additional pertinent studies  Treatments: Surgery  Discharge Exam: General: sitting up in hospital bed, NAD Cardiac: regular rate Pulmonary: no increased work of breathing LLE: dressing CDI, intact EHL/TA/GSC, endorses distal sensation, warm well perfused foot   Disposition: Discharge disposition: 06-Home-Health Care Svc       Discharge Instructions     Call MD for:  redness, tenderness, or signs of infection (pain, swelling, redness, odor or green/yellow discharge around incision site)   Complete by: As directed    Call MD for:  severe uncontrolled pain   Complete by: As directed    Call MD for:  temperature >100.4   Complete by: As directed    Diet - low sodium heart healthy   Complete by: As directed       Allergies as of 08/10/2022       Reactions   Ciprofloxacin Hives   Codeine Hives   Demerol [meperidine] Hives   Hydrogen Peroxide Itching, Other (See Comments)   White blisters   Propoxyphene Other (See Comments)   Unknown Other reaction(s): OTHER   Tramadol Other (See Comments)   "ITCHY ON THE INSIDE"        Medication List     TAKE these medications    acetaminophen 325 MG tablet Commonly known as: TYLENOL Take 325 mg by mouth at bedtime.   amLODipine 2.5 MG tablet Commonly known as: NORVASC Take 1 tablet (2.5 mg) in addition to your 5 mg tablet daily. (Total 7.5 mg )   amLODipine 5 MG tablet Commonly known as: NORVASC TAKE 1  TABLET BY MOUTH ONCE DAILY IN  ADDITION  TO  1  TABLET  OF  2.5  MG  FOR  A  TOTAL  OF  7.5  MG   aspirin EC 81 MG tablet Take 1 tablet (81 mg total) by mouth in the morning and at bedtime. What changed: when to take this   atorvastatin 80 MG tablet Commonly known as: LIPITOR Take 1 tablet (80 mg total) by mouth daily at 6 PM.   cefadroxil 500 MG capsule Commonly known as: DURICEF Take 1 capsule (500 mg total) by mouth 2 (two) times daily for 7 days.   celecoxib 100 MG  capsule Commonly known as: CeleBREX Take 1 capsule (100 mg total) by mouth 2 (two) times daily for 14 days.   cetirizine 10 MG chewable tablet Commonly known as: ZYRTEC Chew 1 tablet (10 mg total) by mouth daily.   clopidogrel 75 MG tablet Commonly known as: PLAVIX Take 1 tablet (75 mg total) by mouth daily.   Cyanocobalamin 5000 MCG Tbdp Take 5,000 mcg by mouth daily.   docusate sodium 100 MG capsule Commonly known as: COLACE Take 100 mg by mouth daily.   fluticasone 50 MCG/ACT nasal spray Commonly known as: FLONASE Place 2 sprays into both nostrils daily. What changed:  when to take this reasons to take this   gabapentin 300 MG capsule Commonly known as: NEURONTIN Take 2 capsules (600 mg total) by mouth daily. What changed:  how much to take when to take this reasons to take this   guaiFENesin 600 MG 12 hr tablet Commonly known as: MUCINEX Take 1 tablet (600 mg total) by mouth 2 (two) times daily. What changed:  when to take this reasons to take this   metoprolol tartrate 25 MG tablet Commonly known as: LOPRESSOR Take 1 tablet (25 mg total) by mouth 2 (two) times daily.   multivitamin capsule Take 1 capsule by mouth daily.   ondansetron 4 MG tablet Commonly known as: Zofran Take 1 tablet (4 mg total) by mouth every 8 (eight) hours as needed for up to 14 days for nausea or vomiting.   oxyCODONE 5 MG immediate release tablet Commonly known as: Roxicodone Take 1 tablet (5 mg total) by mouth every 4 (four) hours as needed for up to 7 days for severe pain or moderate pain.   ProAir RespiClick [id number] (90 Base) MCG/ACT Aepb Generic drug: Albuterol Sulfate Inhale 1 puff into the lungs daily as needed (wheezing).   Prolia 60 MG/ML Sosy injection Generic drug: denosumab Inject 60 mg into the skin every 6 (six) months.   Spiriva Respimat 1.25 MCG/ACT Aers Generic drug: Tiotropium Bromide Monohydrate Inhale 2 each into the lungs daily as needed (Congestion).    Trospium Chloride 60 MG Cp24 Take 1 capsule (60 mg total) by mouth daily.   VITAMIN D PO Take 3,000 Units by mouth daily.        Follow-up [facility]. Follow up.   Why: ([facility])  to provide home physical therapy visits Contact information: [address] [address] [phone]                   Georgiann Cocker, PA-C 08/10/2022

## 2022-08-12 ENCOUNTER — Encounter (HOSPITAL_COMMUNITY): Payer: Self-pay | Admitting: Orthopedic Surgery

## 2022-08-12 DIAGNOSIS — D649 Anemia, unspecified: Secondary | ICD-10-CM | POA: Diagnosis not present

## 2022-08-12 DIAGNOSIS — I358 Other nonrheumatic aortic valve disorders: Secondary | ICD-10-CM | POA: Diagnosis not present

## 2022-08-12 DIAGNOSIS — S72002S Fracture of unspecified part of neck of left femur, sequela: Secondary | ICD-10-CM | POA: Diagnosis not present

## 2022-08-12 DIAGNOSIS — T8484XD Pain due to internal orthopedic prosthetic devices, implants and grafts, subsequent encounter: Secondary | ICD-10-CM | POA: Diagnosis not present

## 2022-08-12 DIAGNOSIS — J449 Chronic obstructive pulmonary disease, unspecified: Secondary | ICD-10-CM | POA: Diagnosis not present

## 2022-08-12 DIAGNOSIS — I1310 Hypertensive heart and chronic kidney disease without heart failure, with stage 1 through stage 4 chronic kidney disease, or unspecified chronic kidney disease: Secondary | ICD-10-CM | POA: Diagnosis not present

## 2022-08-12 DIAGNOSIS — I2510 Atherosclerotic heart disease of native coronary artery without angina pectoris: Secondary | ICD-10-CM | POA: Diagnosis not present

## 2022-08-12 DIAGNOSIS — G8929 Other chronic pain: Secondary | ICD-10-CM | POA: Diagnosis not present

## 2022-08-12 DIAGNOSIS — J8410 Pulmonary fibrosis, unspecified: Secondary | ICD-10-CM | POA: Diagnosis not present

## 2022-08-12 DIAGNOSIS — N183 Chronic kidney disease, stage 3 unspecified: Secondary | ICD-10-CM | POA: Diagnosis not present

## 2022-08-12 LAB — SURGICAL PATHOLOGY

## 2022-08-14 DIAGNOSIS — S72002S Fracture of unspecified part of neck of left femur, sequela: Secondary | ICD-10-CM | POA: Diagnosis not present

## 2022-08-14 DIAGNOSIS — I358 Other nonrheumatic aortic valve disorders: Secondary | ICD-10-CM | POA: Diagnosis not present

## 2022-08-14 DIAGNOSIS — T8484XD Pain due to internal orthopedic prosthetic devices, implants and grafts, subsequent encounter: Secondary | ICD-10-CM | POA: Diagnosis not present

## 2022-08-14 DIAGNOSIS — D649 Anemia, unspecified: Secondary | ICD-10-CM | POA: Diagnosis not present

## 2022-08-14 DIAGNOSIS — N183 Chronic kidney disease, stage 3 unspecified: Secondary | ICD-10-CM | POA: Diagnosis not present

## 2022-08-14 DIAGNOSIS — I2510 Atherosclerotic heart disease of native coronary artery without angina pectoris: Secondary | ICD-10-CM | POA: Diagnosis not present

## 2022-08-14 DIAGNOSIS — I1310 Hypertensive heart and chronic kidney disease without heart failure, with stage 1 through stage 4 chronic kidney disease, or unspecified chronic kidney disease: Secondary | ICD-10-CM | POA: Diagnosis not present

## 2022-08-14 DIAGNOSIS — J449 Chronic obstructive pulmonary disease, unspecified: Secondary | ICD-10-CM | POA: Diagnosis not present

## 2022-08-14 DIAGNOSIS — J8410 Pulmonary fibrosis, unspecified: Secondary | ICD-10-CM | POA: Diagnosis not present

## 2022-08-14 DIAGNOSIS — G8929 Other chronic pain: Secondary | ICD-10-CM | POA: Diagnosis not present

## 2022-08-15 DIAGNOSIS — M1652 Unilateral post-traumatic osteoarthritis, left hip: Secondary | ICD-10-CM | POA: Diagnosis not present

## 2022-08-21 DIAGNOSIS — M1652 Unilateral post-traumatic osteoarthritis, left hip: Secondary | ICD-10-CM | POA: Diagnosis not present

## 2022-09-05 ENCOUNTER — Inpatient Hospital Stay: Payer: No Typology Code available for payment source

## 2022-09-05 ENCOUNTER — Inpatient Hospital Stay: Payer: No Typology Code available for payment source | Attending: Oncology | Admitting: Oncology

## 2022-09-05 ENCOUNTER — Telehealth: Payer: Self-pay | Admitting: *Deleted

## 2022-09-05 VITALS — BP 133/58 | HR 74 | Temp 98.2°F | Resp 18 | Ht <= 58 in | Wt 127.8 lb

## 2022-09-05 DIAGNOSIS — N189 Chronic kidney disease, unspecified: Secondary | ICD-10-CM | POA: Diagnosis not present

## 2022-09-05 DIAGNOSIS — R779 Abnormality of plasma protein, unspecified: Secondary | ICD-10-CM | POA: Diagnosis not present

## 2022-09-05 DIAGNOSIS — D649 Anemia, unspecified: Secondary | ICD-10-CM

## 2022-09-05 LAB — CBC WITH DIFFERENTIAL (CANCER CENTER ONLY)
Abs Immature Granulocytes: 0.04 10*3/uL (ref 0.00–0.07)
Basophils Absolute: 0 10*3/uL (ref 0.0–0.1)
Basophils Relative: 0 %
Eosinophils Absolute: 0.3 10*3/uL (ref 0.0–0.5)
Eosinophils Relative: 3 %
HCT: 30.5 % — ABNORMAL LOW (ref 36.0–46.0)
Hemoglobin: 9.7 g/dL — ABNORMAL LOW (ref 12.0–15.0)
Immature Granulocytes: 0 %
Lymphocytes Relative: 16 %
Lymphs Abs: 1.5 10*3/uL (ref 0.7–4.0)
MCH: 30.6 pg (ref 26.0–34.0)
MCHC: 31.8 g/dL (ref 30.0–36.0)
MCV: 96.2 fL (ref 80.0–100.0)
Monocytes Absolute: 0.8 10*3/uL (ref 0.1–1.0)
Monocytes Relative: 8 %
Neutro Abs: 6.8 10*3/uL (ref 1.7–7.7)
Neutrophils Relative %: 73 %
Platelet Count: 349 10*3/uL (ref 150–400)
RBC: 3.17 MIL/uL — ABNORMAL LOW (ref 3.87–5.11)
RDW: 14.4 % (ref 11.5–15.5)
WBC Count: 9.5 10*3/uL (ref 4.0–10.5)
nRBC: 0 % (ref 0.0–0.2)

## 2022-09-05 LAB — BASIC METABOLIC PANEL - CANCER CENTER ONLY
Anion gap: 9 (ref 5–15)
BUN: 19 mg/dL (ref 8–23)
CO2: 26 mmol/L (ref 22–32)
Calcium: 10.1 mg/dL (ref 8.9–10.3)
Chloride: 107 mmol/L (ref 98–111)
Creatinine: 0.78 mg/dL (ref 0.44–1.00)
GFR, Estimated: >60 mL/min (ref >60)
Glucose, Bld: 103 mg/dL — ABNORMAL HIGH (ref 70–99)
Potassium: 3.9 mmol/L (ref 3.5–5.1)
Sodium: 142 mmol/L (ref 135–145)

## 2022-09-05 NOTE — Progress Notes (Signed)
[facility] OFFICE PROGRESS NOTE   Diagnosis: Elevated kappa light chains  INTERVAL HISTORY:   Ms. Jacqueline Burke turns as scheduled.  She is referred in February after an MRI revealed a heterogenous marrow signal in the left iliac bone.  She was noted to have mild elevation of the serum free kappa light chains.  A serum immunofixation was negative for monoclonal protein. She underwent a left total hip arthroplasty on 08/08/2022.  She reports marked improvement in the left hip pain.  She is scheduled for orthopedic follow-up within the next few weeks.  No fever or night sweats.  She reports a chronic poor appetite.  The hemoglobin returned at 8.1 on 08/10/2022 compared to 12.2 on 07/28/2022.  The pathology from the left femoral head revealed no malignancy.  Objective:  Vital signs in last 24 hours:  Blood pressure (!) 133/58, pulse 74, temperature 98.2 F (36.8 C), temperature source Oral, resp. rate 18, height 4\' 9"  (1.448 m), weight 127 lb 12.8 oz (58 kg), SpO2 98 %.     Lymphatics: No cervical, supraclavicular, axillary, or inguinal nodes Resp: Lungs clear bilaterally, no respiratory distress Cardio: Regular rate and rhythm GI: No hepatosplenomegaly Vascular: No leg edema  Skin: Dressings at the left upper lateral thigh and distal left lateral thigh   Lab Results:  Lab Results  Component Value Date   WBC 13.2 (H) 08/10/2022   HGB 8.1 (L) 08/10/2022   HCT 25.5 (L) 08/10/2022   MCV 98.5 08/10/2022   PLT 231 08/10/2022   NEUTROABS 6.0 07/28/2022    CMP  Lab Results  Component Value Date   NA 137 08/09/2022   K 4.3 08/09/2022   CL 103 08/09/2022   CO2 25 08/09/2022   GLUCOSE 158 (H) 08/09/2022   BUN 19 08/09/2022   CREATININE 0.90 08/09/2022   CALCIUM 8.3 (L) 08/09/2022   PROT 7.4 07/28/2022   ALBUMIN 4.1 07/28/2022   AST 19 07/28/2022   ALT 16 07/28/2022   ALKPHOS 40 07/28/2022   BILITOT 0.5 07/28/2022   GFRNONAA >60 08/09/2022   GFRAA 56 (L)  07/17/2017     Medications: I have reviewed the patient's current medications.   Assessment/Plan: Left hip "pain Left femur surgery 2022 for repair of a traumatic fracture MRI lumbar spine 06/14/2022-no acute findings or explanation for symptoms in the lumbar spine, severe T11 compression fracture-stable from prior radiographs, new heterogenous T1 marrow signal throughout the left iliac bone-indeterminate, progressive left renal cortical thinning and atrophy Left hip arthroplasty 08/08/2022-left femoral head pathology negative for malignancy Mild elevation of the serum free kappa light chains Serum immunofixation 07/22/2022-no monoclonal protein, normal immunoglobulin levels Mild elevation of serum free kappa and lambda light chains 07/22/2022 T11 compression fracture Pulmonary fibrosis COPD Gastroesophageal reflux disease History of coronary artery disease, status post NSTEMI with a 95% RCA treated with a DES and PCI in 2019 Osteoporosis Chronic kidney disease    Disposition: Ms. Jacqueline Burke was referred for hematology evaluation in the setting of mild elevation of the serum free kappa light chains and heterogenous bone marrow signal at the iliac bone on a lumbar MRI.  When I saw her in February a serum immunofixation was negative for a monoclonal protein.  The immunoglobulin levels were normal.  I have a low clinical suspicion for multiple myeloma or another lymphoproliferative disorder.  The mild elevation of the serum free light chains may be related to renal insufficiency.  She had a significant drop in the hemoglobin following the hip surgery.  We will check a CBC today to be sure the hemoglobin is recovering.  Ms. Cliver will continue clinical follow-up with Dr. Chanetta Marshall.  I am available to see her as needed.  Thornton Papas, MD  09/05/2022  8:47 AM

## 2022-09-05 NOTE — Telephone Encounter (Signed)
-----   Message from Ladene Artist, MD sent at 09/05/2022 12:20 PM EDT ----- Please call patient, hemoglobin has increased this discharge from the hospital, but remains low, she should follow-up with Dr. Chanetta Marshall for a repeat CBC within the next 2-4 weeks.  Copy CBC to Dr. Chanetta Marshall

## 2022-09-05 NOTE — Telephone Encounter (Signed)
Informed patient of improvement in Hgb since hospital d/c, but still low. Suggests repeat CBC in 2-4 weeks with Dr. Chanetta Marshall. She agrees. Routed labs to PCP.

## 2022-09-08 LAB — KAPPA/LAMBDA LIGHT CHAINS
Kappa free light chain: 34.1 mg/L — ABNORMAL HIGH (ref 3.3–19.4)
Kappa, lambda light chain ratio: 1.15 (ref 0.26–1.65)
Lambda free light chains: 29.7 mg/L — ABNORMAL HIGH (ref 5.7–26.3)

## 2022-09-09 DIAGNOSIS — Z6824 Body mass index (BMI) 24.0-24.9, adult: Secondary | ICD-10-CM | POA: Diagnosis not present

## 2022-09-09 DIAGNOSIS — M154 Erosive (osteo)arthritis: Secondary | ICD-10-CM | POA: Diagnosis not present

## 2022-09-23 DIAGNOSIS — M1652 Unilateral post-traumatic osteoarthritis, left hip: Secondary | ICD-10-CM | POA: Diagnosis not present

## 2022-10-03 ENCOUNTER — Emergency Department (HOSPITAL_COMMUNITY): Payer: No Typology Code available for payment source

## 2022-10-03 ENCOUNTER — Emergency Department (HOSPITAL_COMMUNITY)
Admission: EM | Admit: 2022-10-03 | Discharge: 2022-10-03 | Disposition: A | Discharge status: Home / Self Care | Payer: No Typology Code available for payment source | Attending: Emergency Medicine | Admitting: Emergency Medicine

## 2022-10-03 ENCOUNTER — Other Ambulatory Visit: Payer: Self-pay

## 2022-10-03 ENCOUNTER — Encounter (HOSPITAL_COMMUNITY): Payer: Self-pay

## 2022-10-03 DIAGNOSIS — N281 Cyst of kidney, acquired: Secondary | ICD-10-CM | POA: Diagnosis not present

## 2022-10-03 DIAGNOSIS — R109 Unspecified abdominal pain: Secondary | ICD-10-CM

## 2022-10-03 DIAGNOSIS — K5900 Constipation, unspecified: Secondary | ICD-10-CM | POA: Diagnosis not present

## 2022-10-03 DIAGNOSIS — Z79899 Other long term (current) drug therapy: Secondary | ICD-10-CM | POA: Insufficient documentation

## 2022-10-03 DIAGNOSIS — D72829 Elevated white blood cell count, unspecified: Secondary | ICD-10-CM | POA: Insufficient documentation

## 2022-10-03 DIAGNOSIS — R100 Acute abdomen: Secondary | ICD-10-CM | POA: Diagnosis not present

## 2022-10-03 DIAGNOSIS — R112 Nausea with vomiting, unspecified: Secondary | ICD-10-CM | POA: Diagnosis not present

## 2022-10-03 DIAGNOSIS — D649 Anemia, unspecified: Secondary | ICD-10-CM | POA: Diagnosis not present

## 2022-10-03 DIAGNOSIS — R1031 Right lower quadrant pain: Secondary | ICD-10-CM | POA: Diagnosis not present

## 2022-10-03 DIAGNOSIS — K769 Liver disease, unspecified: Secondary | ICD-10-CM | POA: Diagnosis not present

## 2022-10-03 LAB — COMPREHENSIVE METABOLIC PANEL
ALT: 13 U/L (ref 0–44)
AST: 18 U/L (ref 15–41)
Albumin: 4.1 g/dL (ref 3.5–5.0)
Alkaline Phosphatase: 44 U/L (ref 38–126)
Anion gap: 10 (ref 5–15)
BUN: 19 mg/dL (ref 8–23)
CO2: 24 mmol/L (ref 22–32)
Calcium: 9.5 mg/dL (ref 8.9–10.3)
Chloride: 103 mmol/L (ref 98–111)
Creatinine, Ser: 0.98 mg/dL (ref 0.44–1.00)
GFR, Estimated: 57 mL/min — ABNORMAL LOW (ref >60)
Glucose, Bld: 108 mg/dL — ABNORMAL HIGH (ref 70–99)
Potassium: 4.1 mmol/L (ref 3.5–5.1)
Sodium: 137 mmol/L (ref 135–145)
Total Bilirubin: 0.7 mg/dL (ref 0.3–1.2)
Total Protein: 7.4 g/dL (ref 6.5–8.1)

## 2022-10-03 LAB — CBC WITH DIFFERENTIAL/PLATELET
Abs Immature Granulocytes: 0.03 10*3/uL (ref 0.00–0.07)
Basophils Absolute: 0 10*3/uL (ref 0.0–0.1)
Basophils Relative: 0 %
Eosinophils Absolute: 0.1 10*3/uL (ref 0.0–0.5)
Eosinophils Relative: 1 %
HCT: 32.3 % — ABNORMAL LOW (ref 36.0–46.0)
Hemoglobin: 10.1 g/dL — ABNORMAL LOW (ref 12.0–15.0)
Immature Granulocytes: 0 %
Lymphocytes Relative: 14 %
Lymphs Abs: 1.5 10*3/uL (ref 0.7–4.0)
MCH: 28.9 pg (ref 26.0–34.0)
MCHC: 31.3 g/dL (ref 30.0–36.0)
MCV: 92.6 fL (ref 80.0–100.0)
Monocytes Absolute: 0.6 10*3/uL (ref 0.1–1.0)
Monocytes Relative: 6 %
Neutro Abs: 8.3 10*3/uL — ABNORMAL HIGH (ref 1.7–7.7)
Neutrophils Relative %: 79 %
Platelets: 308 10*3/uL (ref 150–400)
RBC: 3.49 MIL/uL — ABNORMAL LOW (ref 3.87–5.11)
RDW: 14 % (ref 11.5–15.5)
WBC: 10.6 10*3/uL — ABNORMAL HIGH (ref 4.0–10.5)
nRBC: 0 % (ref 0.0–0.2)

## 2022-10-03 LAB — URINALYSIS, ROUTINE W REFLEX MICROSCOPIC
Bilirubin Urine: NEGATIVE
Glucose, UA: NEGATIVE mg/dL
Hgb urine dipstick: NEGATIVE
Ketones, ur: NEGATIVE mg/dL
Leukocytes,Ua: NEGATIVE
Nitrite: NEGATIVE
Protein, ur: NEGATIVE mg/dL
Specific Gravity, Urine: 1.02 (ref 1.005–1.030)
pH: 5 (ref 5.0–8.0)

## 2022-10-03 LAB — LIPASE, BLOOD: Lipase: 28 U/L (ref 11–51)

## 2022-10-03 MED ORDER — SODIUM CHLORIDE (PF) 0.9 % IJ SOLN
INTRAMUSCULAR | Status: AC
  Filled 2022-10-03: qty 50

## 2022-10-03 MED ORDER — ONDANSETRON HCL 4 MG/2ML IJ SOLN
4.0000 mg | Freq: Once | INTRAMUSCULAR | Status: AC
  Administered 2022-10-03: 4 mg via INTRAVENOUS
  Filled 2022-10-03: qty 2

## 2022-10-03 MED ORDER — METOPROLOL TARTRATE 25 MG PO TABS
25.0000 mg | ORAL_TABLET | Freq: Two times a day (BID) | ORAL | Status: DC
  Administered 2022-10-03: 25 mg via ORAL
  Filled 2022-10-03: qty 1

## 2022-10-03 MED ORDER — SODIUM CHLORIDE 0.9 % IV BOLUS
1000.0000 mL | Freq: Once | INTRAVENOUS | Status: AC
  Administered 2022-10-03: 1000 mL via INTRAVENOUS

## 2022-10-03 MED ORDER — KETOROLAC TROMETHAMINE 30 MG/ML IJ SOLN
30.0000 mg | Freq: Once | INTRAMUSCULAR | Status: AC
  Administered 2022-10-03: 30 mg via INTRAVENOUS
  Filled 2022-10-03: qty 1

## 2022-10-03 MED ORDER — ONDANSETRON 4 MG PO TBDP: 4.0000 mg | ORAL_TABLET | Freq: Three times a day (TID) | ORAL | 0 refills | Status: DC | PRN

## 2022-10-03 MED ORDER — IOHEXOL 300 MG/ML  SOLN
100.0000 mL | Freq: Once | INTRAMUSCULAR | Status: AC | PRN
  Administered 2022-10-03: 100 mL via INTRAVENOUS

## 2022-10-03 MED ORDER — AMLODIPINE BESYLATE 5 MG PO TABS
10.0000 mg | ORAL_TABLET | Freq: Once | ORAL | Status: AC
  Administered 2022-10-03: 10 mg via ORAL
  Filled 2022-10-03: qty 2

## 2022-10-03 NOTE — ED Notes (Signed)
Patient transported to CT 

## 2022-10-03 NOTE — Discharge Instructions (Signed)
For your constipation, I recommend taking 1 scoop of miralax powder in an 8 ounce glass of water, once daily, as needed, for the next 5-7 days.  This should help produce bowel movements.  Once you are moving your bowels you can stop with miralax.  Please follow up with your primary care doctor's office today.  If your abdominal pain gets worse, or you have persistent vomiting, or worsening pain, or fevers, please return to the ER.

## 2022-10-03 NOTE — ED Provider Notes (Signed)
[place] EMERGENCY DEPARTMENT AT [facility] Provider Note   CSN: [id number] Arrival date & time: 10/03/22  4098     History  Chief Complaint  Patient presents with   Abdominal Pain    Jacqueline Burke is a 87 y.o. female Presenting to ED with abdominal pain that began yesterday.  She reports is predominantly right lower quadrant abdominal pain, sharp, persistent, she has felt into different spots.  She says she felt nauseated had 1 episode of dry emesis this morning.  Her last bowel movement was yesterday and she is not passing gas.  She denies any history of abdominal surgeries.  She is here with her husband at the bedside.  HPI     Home Medications Prior to Admission medications   Medication Sig Start Date End Date Taking? Authorizing Provider  ondansetron (ZOFRAN-ODT) 4 MG disintegrating tablet Take 1 tablet (4 mg total) by mouth every 8 (eight) hours as needed for up to 12 doses for nausea or vomiting. 10/03/22  Yes Terald Sleeper, MD  acetaminophen (TYLENOL) 325 MG tablet Take 325 mg by mouth at bedtime.    [provider]  Albuterol Sulfate (PROAIR RESPICLICK) 108 (90 Base) MCG/ACT AEPB Inhale 1 puff into the lungs daily as needed (wheezing). 08/29/20   Medina-Vargas, Monina C, NP  amLODipine (NORVASC) 2.5 MG tablet Take 1 tablet (2.5 mg) in addition to your 5 mg tablet daily. (Total 7.5 mg ) 04/03/21   Turner, Cornelious Bryant, MD  amLODipine (NORVASC) 5 MG tablet TAKE 1 TABLET BY MOUTH ONCE DAILY IN  ADDITION  TO  1  TABLET  OF  2.5  MG  FOR  A  TOTAL  OF  7.5  MG 06/10/22   Turner, Cornelious Bryant, MD  atorvastatin (LIPITOR) 80 MG tablet Take 1 tablet (80 mg total) by mouth daily at 6 PM. 07/21/22   Turner, Cornelious Bryant, MD  Camphor-Menthol-Methyl Sal 3.06-07-08 % PTCH at bedtime.    [provider]  cetirizine (ZYRTEC) 10 MG chewable tablet Chew 1 tablet (10 mg total) by mouth daily. 08/29/20   Medina-Vargas, Monina C, NP  Cholecalciferol (VITAMIN D PO) Take 3,000 Units by  mouth daily.     [provider]  clopidogrel (PLAVIX) 75 MG tablet Take 1 tablet (75 mg total) by mouth daily. 08/10/22   Joen Laura, MD  Cyanocobalamin 5000 MCG TBDP Take 5,000 mcg by mouth daily.    [provider]  docusate sodium (COLACE) 100 MG capsule Take 100 mg by mouth daily as needed.    [provider]  fluticasone (FLONASE) 50 MCG/ACT nasal spray Place 2 sprays into both nostrils daily. Patient taking differently: Place 2 sprays into both nostrils daily as needed for rhinitis. 08/29/20   Medina-Vargas, Monina C, NP  gabapentin (NEURONTIN) 300 MG capsule Take 2 capsules (600 mg total) by mouth daily. Patient taking differently: Take 300 mg by mouth daily as needed (Pain). 08/29/20   Medina-Vargas, Monina C, NP  guaiFENesin (MUCINEX) 600 MG 12 hr tablet Take 1 tablet (600 mg total) by mouth 2 (two) times daily. Patient taking differently: Take 600 mg by mouth 2 (two) times daily as needed for to loosen phlegm. 08/29/20   Medina-Vargas, Monina C, NP  HYDROcodone-acetaminophen (NORCO/VICODIN) 5-325 MG tablet Take 1 tablet by mouth every 6 (six) hours as needed. 09/12/20   [provider]  metoprolol tartrate (LOPRESSOR) 25 MG tablet Take 1 tablet (25 mg total) by mouth 2 (two) times daily.  04/03/21   Quintella Reichert, MD  Multiple Vitamin (MULTIVITAMIN) capsule Take 1 capsule by mouth daily.    [provider]  PROLIA 60 MG/ML SOSY injection Inject 60 mg into the skin every 6 (six) months. 02/06/21   [provider]  Tiotropium Bromide Monohydrate (SPIRIVA RESPIMAT) 1.25 MCG/ACT AERS Inhale 2 each into the lungs daily as needed (Congestion). 10/16/20   [provider]  traMADol (ULTRAM) 50 MG tablet Take 50 mg by mouth every 8 (eight) hours as needed. 07/28/22   [provider]  Trospium Chloride 60 MG CP24 Take 1 capsule (60 mg total) by mouth daily. 08/29/20   Medina-Vargas, Monina C, NP      Allergies     Ciprofloxacin, Codeine, Demerol [meperidine], Hydrogen peroxide, Propoxyphene, and Tramadol    Review of Systems   Review of Systems  Physical Exam Updated Vital Signs BP (!) 168/68   Pulse (!) 53   Temp 97.9 F (36.6 C) (Oral)   Resp 13   Ht 4\' 11"  (1.499 m)   Wt 56.7 kg   SpO2 97%   BMI 25.25 kg/m  Physical Exam Constitutional:      General: She is not in acute distress. HENT:     Head: Normocephalic and atraumatic.  Eyes:     Conjunctiva/sclera: Conjunctivae normal.     Pupils: Pupils are equal, round, and reactive to light.  Cardiovascular:     Rate and Rhythm: Normal rate and regular rhythm.  Pulmonary:     Effort: Pulmonary effort is normal. No respiratory distress.  Abdominal:     General: There is no distension.     Tenderness: There is abdominal tenderness in the right upper quadrant and right lower quadrant. There is guarding.  Skin:    General: Skin is warm and dry.  Neurological:     General: No focal deficit present.     Mental Status: She is alert. Mental status is at baseline.  Psychiatric:        Mood and Affect: Mood normal.        Behavior: Behavior normal.     ED Results / Procedures / Treatments   Labs (all labs ordered are listed, but only abnormal results are displayed) Labs Reviewed  CBC WITH DIFFERENTIAL/PLATELET - Abnormal; Notable for the following components:      Result Value   WBC 10.6 (*)    RBC 3.49 (*)    Hemoglobin 10.1 (*)    HCT 32.3 (*)    Neutro Abs 8.3 (*)    All other components within normal limits  COMPREHENSIVE METABOLIC PANEL - Abnormal; Notable for the following components:   Glucose, Bld 108 (*)    GFR, Estimated 57 (*)    All other components within normal limits  URINALYSIS, ROUTINE W REFLEX MICROSCOPIC - Abnormal; Notable for the following components:   Color, Urine COLORLESS (*)    All other components within normal limits  LIPASE, BLOOD    EKG None  Radiology US Abdomen Limited RUQ  (LIVER/GB)  Result Date: 10/03/2022 CLINICAL DATA:  Acute abdominal pain. EXAM: ULTRASOUND ABDOMEN LIMITED RIGHT UPPER QUADRANT COMPARISON:  CT scan of same day. FINDINGS: Gallbladder: No gallstones or wall thickening visualized. No sonographic Murphy sign noted by sonographer. Common bile duct: Diameter: 3 mm which is within normal limits. Liver: No focal lesion identified. Within normal limits in parenchymal echogenicity. Portal vein is patent on color Doppler imaging with normal direction of blood flow towards the liver. Other: None.  IMPRESSION: No definite abnormality seen in the right upper quadrant the abdomen. Electronically Signed   By: Lupita Raider M.D.   On: 10/03/2022 09:01   CT ABDOMEN PELVIS W CONTRAST  Result Date: 10/03/2022 CLINICAL DATA:  87 year old female with history of acute onset of nonlocalized abdominal pain intermittently for the past several days. EXAM: CT ABDOMEN AND PELVIS WITH CONTRAST TECHNIQUE: Multidetector CT imaging of the abdomen and pelvis was performed using the standard protocol following bolus administration of intravenous contrast. RADIATION DOSE REDUCTION: This exam was performed according to the departmental dose-optimization program which includes automated exposure control, adjustment of the mA and/or kV according to patient size and/or use of iterative reconstruction technique. CONTRAST:  OMNIPAQUE IOHEXOL 300 MG/ML  SOLN COMPARISON:  CT of the abdomen and pelvis 06/07/2017. FINDINGS: Lower chest: Atherosclerotic calcifications in the descending thoracic aorta as well as the right coronary artery. Paraseptal emphysema in the visualized lung bases. Hepatobiliary: Subcentimeter low-attenuation lesion in segment 4B of the liver, too small to characterize, but statistically likely a tiny cyst or biliary hamartoma (no imaging follow-up recommended). No other aggressive appearing hepatic lesions are noted. Small calcified granuloma incidentally noted in segment 4A.  No intra or extrahepatic biliary ductal dilatation. Gallbladder is moderately distended, but otherwise unremarkable in appearance. Pancreas: No pancreatic mass. No pancreatic ductal dilatation. No pancreatic or peripancreatic fluid collections or inflammatory changes. Spleen: Unremarkable. Adrenals/Urinary Tract: Moderate atrophy and multifocal cortical scarring in the left kidney. Subcentimeter low-attenuation lesion in the upper pole of the right kidney, too small to characterize, but statistically likely tiny cysts (no imaging follow-up recommended). No hydroureteronephrosis. Urinary bladder is unremarkable in appearance. Bilateral adrenal glands are normal in appearance. Stomach/Bowel: The appearance of the stomach is normal. No pathologic dilatation of small bowel or colon. Numerous colonic diverticula are noted, without surrounding inflammatory changes to indicate an acute diverticulitis at this time. The appendix is not confidently identified and may be surgically absent. Regardless, there are no inflammatory changes noted adjacent to the cecum to suggest the presence of an acute appendicitis at this time. Vascular/Lymphatic: Atherosclerotic calcifications in the abdominal aorta and pelvic vasculature. No lymphadenopathy noted in the abdomen or pelvis. Reproductive: Uterus and ovaries are atrophic but otherwise unremarkable in appearance. Other: No significant volume of ascites.  No pneumoperitoneum. Musculoskeletal: Compression fracture of T11 with near complete loss of central vertebral body height, new compared to the prior examination, but chronic in appearance. Status post left hip arthroplasty. There are no aggressive appearing lytic or blastic lesions noted in the visualized portions of the skeleton. IMPRESSION: 1. No acute findings are noted in the abdomen or pelvis to account for the patient's symptoms. 2. Colonic diverticulosis without evidence of acute diverticulitis at this time. 3. Aortic  atherosclerosis. 4. Additional incidental findings, as above. Electronically Signed   By: Trudie Reed M.D.   On: 10/03/2022 08:14    Procedures Procedures    Medications Ordered in ED Medications  metoprolol tartrate (LOPRESSOR) tablet 25 mg (25 mg Oral Given 10/03/22 1231)  ondansetron (ZOFRAN) injection 4 mg (4 mg Intravenous Given 10/03/22 0422)  sodium chloride 0.9 % bolus 1,000 mL (0 mLs Intravenous Stopped 10/03/22 1232)  iohexol (OMNIPAQUE) 300 MG/ML solution 100 mL (100 mLs Intravenous Contrast Given 10/03/22 0654)  ketorolac (TORADOL) 30 MG/ML injection 30 mg (30 mg Intravenous Given 10/03/22 0814)  ondansetron (ZOFRAN) injection 4 mg (4 mg Intravenous Given 10/03/22 0814)  amLODipine (NORVASC) tablet 10 mg (10 mg Oral Given 10/03/22 1231)  ED Course/ Medical Decision Making/ A&P Clinical Course as of 10/03/22 1337  Fri Oct 03, 2022  1610 Patient is reassessed.  Overall her pain appears to have improved, but she is also "soreness" near her epigastrium.  There is no clear evidence of acute cholecystitis on her imaging.  We are awaiting UA.  I have advised the nurse that she can attempt a liquid p.o. challenge [MT]  1334 Patient reassessed after monitoring 2 hours after eating a sandwich and drinking water.  No further nausea or vomiting. Pain is minimal.  Based on her workup I think is reasonable for her to follow-up as an outpatient.  I did discuss the case with surgery PA Barnetta Chapel by phone, who noted fecalization of the patient small bowel on CT imaging, which is consistent with acute patient's own report of significant constipation.  The patient reports she took laxatives 3 days ago with large bowel movements but has been very irregular with her BM's.  Advised to consider a full bowel cleanout at home again with MiraLAX, which she already has at home.  She verbalized understanding.  Her husband is taking her home.  Very close return precautions were discussed and given to the patient.  [MT]  1336 At this time of the lower suspicion for acute cholecystitis, acute pancreatitis, colitis, diverticulitis, other acut infectious or inflammatory process in the abdomen, including vascular occlusion.  I think the patient is stable for discharge after nearly 11 hours observation in the hospital, and improvement of her symptoms.  The patient and her husband are in agreement and comfortable with this plan. [MT]    Clinical Course User Index [MT] Arneisha Kincannon, Kermit Balo, MD                             Medical Decision Making Amount and/or Complexity of Data Reviewed Radiology: ordered.  Risk Prescription drug management.   This patient presents to the ED with concern for abdominal pain, nausea. This involves an extensive number of treatment options, and is a complaint that carries with it a high risk of complications and morbidity.  The differential diagnosis includes bowel obstruction versus appendicitis versus colitis versus UTI versus ureteral colic versus other  Additional history obtained from patient's husband  I ordered and personally interpreted labs.  The pertinent results include:  WBC 10.6, hgb 10.1, cmp and lipase wnl.  I ordered imaging studies including CT abdomen pelvis, RUQ ultrasound I independently visualized and interpreted imaging which showed no emergent findings, questionable distention of gallbladder noted on CT scan, with unremarkable gallbladder ultrasound noted on right upper quadrant ultrasound. I agree with the radiologist interpretation  The patient was maintained on a cardiac monitor.  I personally viewed and interpreted the cardiac monitored which showed an underlying rhythm of: NSR   I ordered medication including IV Toradol for pain.  Amlodipine for blood home blood pressure medicine.  Fluids and Zofran for nausea and hydration.  Metoprolol for home BP med  I have reviewed the patients home medicines and have made adjustments as needed  Test  Considered: I doubt AAA or vascular complication requiring emergent angiogram imaging at this time.  After the interventions noted above, I reevaluated the patient and found that they have: improved  Note that patient's O2 dropped to 90% while sleeping, improved rapidly to normal when awake - no new oxygen requirement, stable on room air.   Dispostion:  After consideration of the diagnostic results  and the patients response to treatment, I feel that the patent would benefit from close outpatient follow up.         Final Clinical Impression(s) / ED Diagnoses Final diagnoses:  Constipation, unspecified constipation type  Abdominal pain, unspecified abdominal location    Rx / DC Orders ED Discharge Orders          Ordered    ondansetron (ZOFRAN-ODT) 4 MG disintegrating tablet  Every 8 hours PRN        10/03/22 1332              Terald Sleeper, MD 10/03/22 1338

## 2022-10-03 NOTE — ED Triage Notes (Addendum)
Patient reports abd pain since midnight as well as n/v. Patient states pain is in the RLQ, abd distended and hard. States last BM was yesterday. Had been having intermittent pain for the past few days but tonight pain has stayed constant and worse.

## 2022-10-07 DIAGNOSIS — R5383 Other fatigue: Secondary | ICD-10-CM | POA: Diagnosis not present

## 2022-10-07 DIAGNOSIS — M810 Age-related osteoporosis without current pathological fracture: Secondary | ICD-10-CM | POA: Diagnosis not present

## 2022-10-07 DIAGNOSIS — E559 Vitamin D deficiency, unspecified: Secondary | ICD-10-CM | POA: Diagnosis not present

## 2022-10-19 ENCOUNTER — Other Ambulatory Visit: Payer: Self-pay | Admitting: Cardiology

## 2022-10-19 DIAGNOSIS — I10 Essential (primary) hypertension: Secondary | ICD-10-CM

## 2022-10-21 ENCOUNTER — Encounter (HOSPITAL_COMMUNITY): Payer: Self-pay

## 2022-10-21 ENCOUNTER — Emergency Department (HOSPITAL_COMMUNITY): Payer: No Typology Code available for payment source

## 2022-10-21 ENCOUNTER — Other Ambulatory Visit: Payer: Self-pay

## 2022-10-21 ENCOUNTER — Emergency Department (HOSPITAL_COMMUNITY)
Admission: EM | Admit: 2022-10-21 | Discharge: 2022-10-21 | Disposition: A | Discharge status: Home / Self Care | Payer: No Typology Code available for payment source | Attending: Emergency Medicine | Admitting: Emergency Medicine

## 2022-10-21 DIAGNOSIS — M25532 Pain in left wrist: Secondary | ICD-10-CM | POA: Diagnosis present

## 2022-10-21 DIAGNOSIS — J8410 Pulmonary fibrosis, unspecified: Secondary | ICD-10-CM | POA: Diagnosis not present

## 2022-10-21 DIAGNOSIS — E559 Vitamin D deficiency, unspecified: Secondary | ICD-10-CM | POA: Diagnosis not present

## 2022-10-21 DIAGNOSIS — N1831 Chronic kidney disease, stage 3a: Secondary | ICD-10-CM | POA: Diagnosis not present

## 2022-10-21 DIAGNOSIS — Z0000 Encounter for general adult medical examination without abnormal findings: Secondary | ICD-10-CM | POA: Diagnosis not present

## 2022-10-21 DIAGNOSIS — S52532A Colles' fracture of left radius, initial encounter for closed fracture: Secondary | ICD-10-CM | POA: Insufficient documentation

## 2022-10-21 DIAGNOSIS — S0093XA Contusion of unspecified part of head, initial encounter: Secondary | ICD-10-CM | POA: Diagnosis not present

## 2022-10-21 DIAGNOSIS — E538 Deficiency of other specified B group vitamins: Secondary | ICD-10-CM | POA: Diagnosis not present

## 2022-10-21 DIAGNOSIS — Z9989 Dependence on other enabling machines and devices: Secondary | ICD-10-CM | POA: Diagnosis not present

## 2022-10-21 DIAGNOSIS — S0083XA Contusion of other part of head, initial encounter: Secondary | ICD-10-CM | POA: Diagnosis not present

## 2022-10-21 DIAGNOSIS — W010XXA Fall on same level from slipping, tripping and stumbling without subsequent striking against object, initial encounter: Secondary | ICD-10-CM | POA: Diagnosis not present

## 2022-10-21 DIAGNOSIS — I700 Atherosclerosis of aorta: Secondary | ICD-10-CM | POA: Diagnosis not present

## 2022-10-21 DIAGNOSIS — R413 Other amnesia: Secondary | ICD-10-CM | POA: Diagnosis not present

## 2022-10-21 DIAGNOSIS — Z7902 Long term (current) use of antithrombotics/antiplatelets: Secondary | ICD-10-CM | POA: Insufficient documentation

## 2022-10-21 DIAGNOSIS — I10 Essential (primary) hypertension: Secondary | ICD-10-CM | POA: Diagnosis not present

## 2022-10-21 DIAGNOSIS — E7800 Pure hypercholesterolemia, unspecified: Secondary | ICD-10-CM | POA: Diagnosis not present

## 2022-10-21 DIAGNOSIS — S0990XA Unspecified injury of head, initial encounter: Secondary | ICD-10-CM | POA: Diagnosis not present

## 2022-10-21 DIAGNOSIS — S52352A Displaced comminuted fracture of shaft of radius, left arm, initial encounter for closed fracture: Secondary | ICD-10-CM | POA: Diagnosis not present

## 2022-10-21 DIAGNOSIS — Z79899 Other long term (current) drug therapy: Secondary | ICD-10-CM | POA: Diagnosis not present

## 2022-10-21 DIAGNOSIS — R350 Frequency of micturition: Secondary | ICD-10-CM | POA: Diagnosis not present

## 2022-10-21 MED ORDER — ACETAMINOPHEN 325 MG PO TABS
650.0000 mg | ORAL_TABLET | Freq: Once | ORAL | Status: AC
  Administered 2022-10-21: 650 mg via ORAL
  Filled 2022-10-21: qty 2

## 2022-10-21 MED ORDER — ONDANSETRON 8 MG PO TBDP: 8.0000 mg | ORAL_TABLET | Freq: Three times a day (TID) | ORAL | 0 refills | Status: DC | PRN

## 2022-10-21 MED ORDER — LIDOCAINE-EPINEPHRINE (PF) 2 %-1:200000 IJ SOLN
20.0000 mL | Freq: Once | INTRAMUSCULAR | Status: DC
  Filled 2022-10-21: qty 20

## 2022-10-21 MED ORDER — HYDROMORPHONE HCL 1 MG/ML IJ SOLN
1.0000 mg | Freq: Once | INTRAMUSCULAR | Status: AC
  Administered 2022-10-21: 1 mg via INTRAMUSCULAR
  Filled 2022-10-21: qty 1

## 2022-10-21 MED ORDER — HYDROCODONE-ACETAMINOPHEN 5-325 MG PO TABS: 1.0000 | ORAL_TABLET | Freq: Four times a day (QID) | ORAL | 0 refills | Status: DC | PRN

## 2022-10-21 NOTE — ED Notes (Signed)
Patient transported to CT 

## 2022-10-21 NOTE — ED Provider Notes (Signed)
[place] EMERGENCY DEPARTMENT AT [facility] Provider Note   CSN: [id number] Arrival date & time: 10/21/22  1745     History  Chief Complaint  Patient presents with   Fall   Wrist Pain    Jacqueline Burke is a 87 y.o. female.  Pt s/p fall at home today. Was putting groceries away and indicates feels foot/shoe got stuck and fell forward, hit head, and noted left wrist pain post fall. Is on plavix. No loc. No neck or back pain. No radicular pain. No numbness/weakness. No change in speech or vision. Denies preceding faintness or dizziness. No chest pain or discomfort. No sob. No palpitations. No abd pain or nvd. No other extremity pain or injury. Skin intact. Ambulatory since. Pt is right hand dominant.   The history is provided by the patient and medical records.  Fall Pertinent negatives include no chest pain, no abdominal pain, no headaches and no shortness of breath.  Wrist Pain Pertinent negatives include no chest pain, no abdominal pain, no headaches and no shortness of breath.       Home Medications Prior to Admission medications   Medication Sig Start Date End Date Taking? Authorizing Provider  acetaminophen (TYLENOL) 325 MG tablet Take 325 mg by mouth at bedtime.    [provider]  Albuterol Sulfate (PROAIR RESPICLICK) 108 (90 Base) MCG/ACT AEPB Inhale 1 puff into the lungs daily as needed (wheezing). 08/29/20   Medina-Vargas, Monina C, NP  amLODipine (NORVASC) 2.5 MG tablet TAKE 1 TABLET BY MOUTH ONCE DAILY IN  ADDITION  TO  YOUR  5  MG  TABLET  DAILY  (TOTAL  7.5  MG) 10/20/22   Turner, Traci R, MD  amLODipine (NORVASC) 5 MG tablet TAKE 1 TABLET BY MOUTH ONCE DAILY IN  ADDITION  TO  1  TABLET  OF  2.5  MG  FOR  A  TOTAL  OF  7.5  MG 06/10/22   Turner, Cornelious Bryant, MD  atorvastatin (LIPITOR) 80 MG tablet Take 1 tablet (80 mg total) by mouth daily at 6 PM. 07/21/22   Turner, Cornelious Bryant, MD  Camphor-Menthol-Methyl Sal 3.06-07-08 % PTCH at bedtime.    [provider]  cetirizine (ZYRTEC) 10 MG chewable tablet Chew 1 tablet (10 mg total) by mouth daily. 08/29/20   Medina-Vargas, Monina C, NP  Cholecalciferol (VITAMIN D PO) Take 3,000 Units by mouth daily.     [provider]  clopidogrel (PLAVIX) 75 MG tablet Take 1 tablet (75 mg total) by mouth daily. 08/10/22   Joen Laura, MD  Cyanocobalamin 5000 MCG TBDP Take 5,000 mcg by mouth daily.    [provider]  docusate sodium (COLACE) 100 MG capsule Take 100 mg by mouth daily as needed.    [provider]  fluticasone (FLONASE) 50 MCG/ACT nasal spray Place 2 sprays into both nostrils daily. Patient taking differently: Place 2 sprays into both nostrils daily as needed for rhinitis. 08/29/20   Medina-Vargas, Monina C, NP  gabapentin (NEURONTIN) 300 MG capsule Take 2 capsules (600 mg total) by mouth daily. Patient taking differently: Take 300 mg by mouth daily as needed (Pain). 08/29/20   Medina-Vargas, Monina C, NP  guaiFENesin (MUCINEX) 600 MG 12 hr tablet Take 1 tablet (600 mg total) by mouth 2 (two) times daily. Patient taking differently: Take 600 mg by mouth 2 (two) times daily as needed for to loosen phlegm. 08/29/20   Medina-Vargas, Monina C, NP  HYDROcodone-acetaminophen (NORCO/VICODIN) 5-325  MG tablet Take 1 tablet by mouth every 6 (six) hours as needed. 09/12/20   [provider]  metoprolol tartrate (LOPRESSOR) 25 MG tablet Take 1 tablet (25 mg total) by mouth 2 (two) times daily. 04/03/21   Quintella Reichert, MD  Multiple Vitamin (MULTIVITAMIN) capsule Take 1 capsule by mouth daily.    [provider]  ondansetron (ZOFRAN-ODT) 4 MG disintegrating tablet Take 1 tablet (4 mg total) by mouth every 8 (eight) hours as needed for up to 12 doses for nausea or vomiting. 10/03/22   Terald Sleeper, MD  PROLIA 60 MG/ML SOSY injection Inject 60 mg into the skin every 6 (six) months. 02/06/21   [provider]  Tiotropium Bromide Monohydrate (SPIRIVA  RESPIMAT) 1.25 MCG/ACT AERS Inhale 2 each into the lungs daily as needed (Congestion). 10/16/20   [provider]  traMADol (ULTRAM) 50 MG tablet Take 50 mg by mouth every 8 (eight) hours as needed. 07/28/22   [provider]  Trospium Chloride 60 MG CP24 Take 1 capsule (60 mg total) by mouth daily. 08/29/20   Medina-Vargas, Monina C, NP      Allergies    Ciprofloxacin, Codeine, Demerol [meperidine], Hydrogen peroxide, Propoxyphene, and Tramadol    Review of Systems   Review of Systems  Constitutional:  Negative for fever.  HENT:  Negative for nosebleeds.   Respiratory:  Negative for shortness of breath.   Cardiovascular:  Negative for chest pain.  Gastrointestinal:  Negative for abdominal pain and vomiting.  Musculoskeletal:  Negative for back pain and neck stiffness.  Skin:  Negative for wound.  Neurological:  Negative for weakness, numbness and headaches.    Physical Exam Updated Vital Signs BP (!) 116/99 (BP Location: Right Arm)   Pulse 71   Temp 98.9 F (37.2 C) (Oral)   Resp 18   Wt 56 kg   SpO2 94%   BMI 24.94 kg/m  Physical Exam Vitals and nursing note reviewed.  Constitutional:      Appearance: Normal appearance. She is well-developed.  HENT:     Head:     Comments: Contusion/bruise to forehead.     Nose: Nose normal.     Mouth/Throat:     Mouth: Mucous membranes are moist.  Eyes:     General: No scleral icterus.    Conjunctiva/sclera: Conjunctivae normal.     Pupils: Pupils are equal, round, and reactive to light.  Neck:     Vascular: No carotid bruit.     Trachea: No tracheal deviation.  Cardiovascular:     Rate and Rhythm: Normal rate and regular rhythm.     Pulses: Normal pulses.     Heart sounds: Normal heart sounds. No murmur heard.    No friction rub. No gallop.  Pulmonary:     Effort: Pulmonary effort is normal. No respiratory distress.     Breath sounds: Normal breath sounds.  Chest:     Chest wall: No tenderness.  Abdominal:      General: There is no distension.     Palpations: Abdomen is soft.     Tenderness: There is no abdominal tenderness.  Musculoskeletal:        General: No swelling.     Cervical back: Normal range of motion and neck supple. No rigidity or tenderness. No muscular tenderness.     Comments: Swelling and tenderness to left wrist. Skin intact. No other focal bony tenderness on extremity exam. CTLS spine, non tender, aligned, no step off.  Skin:    General: Skin is warm and dry.     Findings: No rash.  Neurological:     Mental Status: She is alert.     Comments: Alert, speech normal. GCS 15. Motor/sens grossly intact bil. Left hand nvi.   Psychiatric:        Mood and Affect: Mood normal.     ED Results / Procedures / Treatments   Labs (all labs ordered are listed, but only abnormal results are displayed) Labs Reviewed - No data to display  EKG None  Radiology No results found.  Procedures .Ortho Injury Treatment  Date/Time: 10/21/2022 8:54 PM  Performed by: Cathren Laine, MD Authorized by: Cathren Laine, MD   Consent:    Consent given by:  Patient   Risks discussed:  FractureInjury location: wrist Location details: left wrist Injury type: fracture Fracture type: distal radius Pre-procedure neurovascular assessment: neurovascularly intact Pre-procedure distal perfusion: normal Pre-procedure neurological function: normal Pre-procedure range of motion: reduced  Patient sedated: NoManipulation performed: yes Immobilization: splint Splint type: sugar tong Splint Applied by: Ortho Tech Supplies used: Ortho-Glass Post-procedure neurovascular assessment: post-procedure neurovascularly intact Post-procedure distal perfusion: normal Post-procedure neurological function: normal Post-procedure range of motion: unchanged Comments: Dilaudid given prior to reduction and splinting       Medications Ordered in ED Medications - No data to display  ED Course/ Medical  Decision Making/ A&P                             Medical Decision Making Problems Addressed: Closed Colles' fracture of left radius, initial encounter: acute illness or injury with systemic symptoms that poses a threat to life or bodily functions Contusion of head, initial encounter: acute illness or injury with systemic symptoms that poses a threat to life or bodily functions Fall from slip, trip, or stumble, initial encounter: acute illness or injury with systemic symptoms that poses a threat to life or bodily functions  Amount and/or Complexity of Data Reviewed Independent Historian: spouse    Details: hx External Data Reviewed: notes. Radiology: ordered and independent interpretation performed. Decision-making details documented in ED Course.  Risk OTC drugs. Prescription drug management.   Imaging ordered.  Reviewed nursing notes and prior charts for additional history.   Xrays reviewed/interpreted by me - distal radius fx, ~ 20-25 deg dorsal angulation.   Ortho/hand consulted, discussed with Dr Kerry Fort - he indicates to put in sugar tong, with attempt to improve angulation during splinting, and f/u in office in coming week.   Discussed options with pt, putting iv/iv meds/sedation, po, hem block, etc. Pt requests 'shot'/IM pain med. Dilaudid 1 mg IM.    Reduced, splinted. Post splint radial pulse 2+. Pain improved. No numbness/weakness. Hand nvi.  Ct reviewed/interpreted by me - no hem.   Rec ortho/hand f/u. Pt indicates has gone to [facility] in past for other orthopedic issues and prefers to f/u there - rec f/u in the next few dys.   Pt currently appears stable for d/c. Spouse w pt.   Return precautions provided.           Final Clinical Impression(s) / ED Diagnoses Final diagnoses:  None    Rx / DC Orders ED Discharge Orders     None         Cathren Laine, MD 10/21/22 2055

## 2022-10-21 NOTE — Discharge Instructions (Addendum)
It was our pleasure to provide your ER care today - we hope that you feel better.  Fall precautions.   Wear splint, keep it clean and dry. Elevate wrist to help with swelling/pain. You may take hydrocodone as need for pain. No driving for the next 6 hours or when taking hydrocodone. Also, do not take tylenol or acetaminophen containing medication when taking hydrocodone. If the pain meds make you nauseated, you may take zofran as need for nausea.    Follow up closely with orthopedist in the next few days - call office tomorrow AM to arrange appointment.   Return to ER if worse, new symptoms, new/severe pain, or other concern.

## 2022-10-21 NOTE — ED Triage Notes (Signed)
C/o mechanical fall today hitting head and left wrist.  C/o left wrist pain  Denies LOC/headache/vision change/neck pain.  A&O x4 and ambulatory to triage Denies numbness/tingling to hand. Sensation and radial pulse intact.  Currently on plavix

## 2022-10-22 DIAGNOSIS — S52502A Unspecified fracture of the lower end of left radius, initial encounter for closed fracture: Secondary | ICD-10-CM | POA: Diagnosis not present

## 2022-10-22 NOTE — Progress Notes (Signed)
For Anesthesia: PCP - Shon Hale, MD  Cardiologist - Armanda Magic, MD   Chest x-ray -  EKG - 04/10/22 in [facility] Stress Test - 11/23/2017 ECHO - 04/23/21 in [facility] Cardiac Cath - 06/15/2017 in [facility] Pacemaker/ICD device last checked:N/A Pacemaker orders received:N/A Device Rep notified:N/A  Spinal Cord Stimulator:  Sleep Study -  CPAP -   Fasting Blood Sugar -  Checks Blood Sugar _____ times a day Date and result of last Hgb A1c-  Last dose of GLP1 agonist-  GLP1 instructions:   Last dose of SGLT-2 inhibitors-  SGLT-2 instructions:  Blood Thinner Instructions: Plavix Aspirin Instructions: Aspirin Last Dose:  Activity level: Can go up a flight of stairs and activities of daily living without stopping and without chest pain and/or shortness of breath    Anesthesia review: CAD, MI, heart murmur, HTN  Patient denies shortness of breath, fever, cough and chest pain at PAT appointment   Patient verbalized understanding of instructions reviewed via telephone.

## 2022-10-22 NOTE — Progress Notes (Signed)
Attempt to call Pt for preop call, unable to reach, left message.

## 2022-10-22 NOTE — Progress Notes (Signed)
Attempted to obtain medical history via telephone, unable to reach at this time. HIPAA compliant voicemail message left requesting return call to pre surgical testing department. 

## 2022-10-23 ENCOUNTER — Encounter (HOSPITAL_COMMUNITY): Payer: Self-pay | Admitting: Orthopedic Surgery

## 2022-10-23 ENCOUNTER — Ambulatory Visit (HOSPITAL_COMMUNITY)
Admission: RE | Admit: 2022-10-23 | Discharge: 2022-10-23 | Disposition: A | Discharge status: Home / Self Care | Payer: No Typology Code available for payment source | Attending: Orthopedic Surgery | Admitting: Orthopedic Surgery

## 2022-10-23 ENCOUNTER — Encounter (HOSPITAL_COMMUNITY)
Admission: RE | Disposition: A | Discharge status: Home / Self Care | Payer: Self-pay | Source: Home / Self Care | Attending: Orthopedic Surgery

## 2022-10-23 ENCOUNTER — Ambulatory Visit (HOSPITAL_COMMUNITY): Payer: No Typology Code available for payment source | Admitting: Physician Assistant

## 2022-10-23 ENCOUNTER — Ambulatory Visit (HOSPITAL_BASED_OUTPATIENT_CLINIC_OR_DEPARTMENT_OTHER): Payer: No Typology Code available for payment source | Admitting: Physician Assistant

## 2022-10-23 DIAGNOSIS — I2510 Atherosclerotic heart disease of native coronary artery without angina pectoris: Secondary | ICD-10-CM

## 2022-10-23 DIAGNOSIS — Z955 Presence of coronary angioplasty implant and graft: Secondary | ICD-10-CM | POA: Insufficient documentation

## 2022-10-23 DIAGNOSIS — D649 Anemia, unspecified: Secondary | ICD-10-CM | POA: Diagnosis not present

## 2022-10-23 DIAGNOSIS — S52502D Unspecified fracture of the lower end of left radius, subsequent encounter for closed fracture with routine healing: Secondary | ICD-10-CM

## 2022-10-23 DIAGNOSIS — W19XXXA Unspecified fall, initial encounter: Secondary | ICD-10-CM | POA: Insufficient documentation

## 2022-10-23 DIAGNOSIS — J449 Chronic obstructive pulmonary disease, unspecified: Secondary | ICD-10-CM | POA: Diagnosis not present

## 2022-10-23 DIAGNOSIS — S52502A Unspecified fracture of the lower end of left radius, initial encounter for closed fracture: Secondary | ICD-10-CM | POA: Insufficient documentation

## 2022-10-23 DIAGNOSIS — Z87891 Personal history of nicotine dependence: Secondary | ICD-10-CM | POA: Insufficient documentation

## 2022-10-23 DIAGNOSIS — S52532A Colles' fracture of left radius, initial encounter for closed fracture: Secondary | ICD-10-CM | POA: Diagnosis not present

## 2022-10-23 DIAGNOSIS — Z7902 Long term (current) use of antithrombotics/antiplatelets: Secondary | ICD-10-CM | POA: Insufficient documentation

## 2022-10-23 DIAGNOSIS — G8918 Other acute postprocedural pain: Secondary | ICD-10-CM | POA: Diagnosis not present

## 2022-10-23 DIAGNOSIS — I10 Essential (primary) hypertension: Secondary | ICD-10-CM

## 2022-10-23 DIAGNOSIS — M1990 Unspecified osteoarthritis, unspecified site: Secondary | ICD-10-CM | POA: Diagnosis not present

## 2022-10-23 DIAGNOSIS — I252 Old myocardial infarction: Secondary | ICD-10-CM | POA: Insufficient documentation

## 2022-10-23 HISTORY — PX: OPEN REDUCTION INTERNAL FIXATION (ORIF) DISTAL RADIAL FRACTURE: SHX5989

## 2022-10-23 SURGERY — OPEN REDUCTION INTERNAL FIXATION (ORIF) DISTAL RADIUS FRACTURE
Anesthesia: Regional | Laterality: Left

## 2022-10-23 MED ORDER — 0.9 % SODIUM CHLORIDE (POUR BTL) OPTIME
TOPICAL | Status: DC | PRN
  Administered 2022-10-23: 1000 mL

## 2022-10-23 MED ORDER — CEFAZOLIN SODIUM-DEXTROSE 2-4 GM/100ML-% IV SOLN
2.0000 g | INTRAVENOUS | Status: AC
  Administered 2022-10-23: 2 g via INTRAVENOUS
  Filled 2022-10-23: qty 100

## 2022-10-23 MED ORDER — EPHEDRINE 5 MG/ML INJ
INTRAVENOUS | Status: AC
  Filled 2022-10-23: qty 5

## 2022-10-23 MED ORDER — FENTANYL CITRATE PF 50 MCG/ML IJ SOSY
100.0000 ug | PREFILLED_SYRINGE | INTRAMUSCULAR | Status: DC
  Administered 2022-10-23: 50 ug via INTRAVENOUS
  Filled 2022-10-23: qty 2

## 2022-10-23 MED ORDER — POVIDONE-IODINE 7.5 % EX SOLN: Freq: Once | CUTANEOUS | Status: DC

## 2022-10-23 MED ORDER — LIDOCAINE 2% (20 MG/ML) 5 ML SYRINGE
INTRAMUSCULAR | Status: DC | PRN
  Administered 2022-10-23: 60 mg via INTRAVENOUS

## 2022-10-23 MED ORDER — CHLORHEXIDINE GLUCONATE 0.12 % MT SOLN
15.0000 mL | Freq: Once | OROMUCOSAL | Status: AC
  Administered 2022-10-23: 15 mL via OROMUCOSAL

## 2022-10-23 MED ORDER — ONDANSETRON HCL 4 MG/2ML IJ SOLN: 4.0000 mg | Freq: Once | INTRAMUSCULAR | Status: DC | PRN

## 2022-10-23 MED ORDER — LACTATED RINGERS IV SOLN: INTRAVENOUS | Status: DC

## 2022-10-23 MED ORDER — DEXAMETHASONE SODIUM PHOSPHATE 10 MG/ML IJ SOLN
INTRAMUSCULAR | Status: DC | PRN
  Administered 2022-10-23: 4 mg via INTRAVENOUS

## 2022-10-23 MED ORDER — ACETAMINOPHEN 500 MG PO TABS
1000.0000 mg | ORAL_TABLET | Freq: Once | ORAL | Status: AC
  Administered 2022-10-23: 1000 mg via ORAL
  Filled 2022-10-23: qty 2

## 2022-10-23 MED ORDER — DEXAMETHASONE SODIUM PHOSPHATE 10 MG/ML IJ SOLN
INTRAMUSCULAR | Status: AC
  Filled 2022-10-23: qty 1

## 2022-10-23 MED ORDER — FENTANYL CITRATE (PF) 100 MCG/2ML IJ SOLN
INTRAMUSCULAR | Status: DC | PRN
  Administered 2022-10-23: 25 ug via INTRAVENOUS

## 2022-10-23 MED ORDER — LIDOCAINE HCL (PF) 2 % IJ SOLN
INTRAMUSCULAR | Status: AC
  Filled 2022-10-23: qty 5

## 2022-10-23 MED ORDER — FENTANYL CITRATE PF 50 MCG/ML IJ SOSY: 25.0000 ug | PREFILLED_SYRINGE | INTRAMUSCULAR | Status: DC | PRN

## 2022-10-23 MED ORDER — ONDANSETRON HCL 4 MG/2ML IJ SOLN
INTRAMUSCULAR | Status: AC
  Filled 2022-10-23: qty 2

## 2022-10-23 MED ORDER — PROPOFOL 10 MG/ML IV BOLUS
INTRAVENOUS | Status: AC
  Filled 2022-10-23: qty 20

## 2022-10-23 MED ORDER — ONDANSETRON HCL 4 MG/2ML IJ SOLN
INTRAMUSCULAR | Status: DC | PRN
  Administered 2022-10-23: 4 mg via INTRAVENOUS

## 2022-10-23 MED ORDER — ROPIVACAINE HCL 5 MG/ML IJ SOLN
INTRAMUSCULAR | Status: DC | PRN
  Administered 2022-10-23: 25 mL via PERINEURAL

## 2022-10-23 MED ORDER — EPHEDRINE SULFATE-NACL 50-0.9 MG/10ML-% IV SOSY
PREFILLED_SYRINGE | INTRAVENOUS | Status: DC | PRN
  Administered 2022-10-23 (×2): 10 mg via INTRAVENOUS

## 2022-10-23 MED ORDER — PROPOFOL 10 MG/ML IV BOLUS
INTRAVENOUS | Status: DC | PRN
  Administered 2022-10-23: 100 mg via INTRAVENOUS

## 2022-10-23 MED ORDER — FENTANYL CITRATE (PF) 100 MCG/2ML IJ SOLN
INTRAMUSCULAR | Status: AC
  Filled 2022-10-23: qty 2

## 2022-10-23 MED ORDER — AMISULPRIDE (ANTIEMETIC) 5 MG/2ML IV SOLN: 10.0000 mg | Freq: Once | INTRAVENOUS | Status: DC | PRN

## 2022-10-23 MED ORDER — BUPIVACAINE HCL (PF) 0.25 % IJ SOLN
INTRAMUSCULAR | Status: AC
  Filled 2022-10-23: qty 30

## 2022-10-23 MED ORDER — METOPROLOL TARTRATE 25 MG PO TABS
25.0000 mg | ORAL_TABLET | ORAL | Status: AC
  Administered 2022-10-23: 25 mg via ORAL
  Filled 2022-10-23: qty 1

## 2022-10-23 MED ORDER — POVIDONE-IODINE 10 % EX SWAB: 2.0000 | Freq: Once | CUTANEOUS | Status: DC

## 2022-10-23 MED ORDER — ORAL CARE MOUTH RINSE: 15.0000 mL | Freq: Once | OROMUCOSAL | Status: AC

## 2022-10-23 MED ORDER — MIDAZOLAM HCL 2 MG/2ML IJ SOLN
2.0000 mg | INTRAMUSCULAR | Status: DC
  Filled 2022-10-23: qty 2

## 2022-10-23 SURGICAL SUPPLY — 76 items
BAG COUNTER SPONGE SURGICOUNT (BAG) ×4 IMPLANT
BAG SPNG CNTER NS LX DISP (BAG) ×2
BIT DRILL 2.2 SS TIBIAL (BIT) IMPLANT
BLADE MINI RND TIP GREEN BEAV (BLADE) IMPLANT
BLADE SURG 15 STRL LF DISP TIS (BLADE) ×2 IMPLANT
BLADE SURG 15 STRL SS (BLADE) ×1
BNDG CMPR 5X3 KNIT ELC UNQ LF (GAUZE/BANDAGES/DRESSINGS)
BNDG CMPR 5X4 CHSV STRCH STRL (GAUZE/BANDAGES/DRESSINGS) ×1
BNDG CMPR 5X4 KNIT ELC UNQ LF (GAUZE/BANDAGES/DRESSINGS)
BNDG CMPR 9X4 STRL LF SNTH (GAUZE/BANDAGES/DRESSINGS) ×1
BNDG CMPR MED 10X6 ELC LF (GAUZE/BANDAGES/DRESSINGS) ×1
BNDG COHESIVE 4X5 TAN STRL LF (GAUZE/BANDAGES/DRESSINGS) ×2 IMPLANT
BNDG ELASTIC 3INX 5YD STR LF (GAUZE/BANDAGES/DRESSINGS) IMPLANT
BNDG ELASTIC 4INX 5YD STR LF (GAUZE/BANDAGES/DRESSINGS) IMPLANT
BNDG ELASTIC 6X10 VLCR STRL LF (GAUZE/BANDAGES/DRESSINGS) IMPLANT
BNDG ESMARK 4X9 LF (GAUZE/BANDAGES/DRESSINGS) ×2 IMPLANT
CLSR STERI-STRIP ANTIMIC 1/2X4 (GAUZE/BANDAGES/DRESSINGS) IMPLANT
CORD BIPOLAR FORCEPS 12FT (ELECTRODE) ×2 IMPLANT
COVER BACK TABLE 60X90IN (DRAPES) ×2 IMPLANT
COVER MAYO STAND STRL (DRAPES) IMPLANT
CUFF TOURN SGL QUICK 18 (TOURNIQUET CUFF) IMPLANT
CUFF TOURN SGL QUICK 18X4 (TOURNIQUET CUFF) IMPLANT
DRAPE C-ARM 42X120 X-RAY (DRAPES) ×2 IMPLANT
DRAPE EXTREMITY T 121X128X90 (DISPOSABLE) ×2 IMPLANT
DRAPE IMP U-DRAPE 54X76 (DRAPES) ×2 IMPLANT
DRAPE OEC MINIVIEW 54X84 (DRAPES) ×2 IMPLANT
DRAPE SURG 17X11 SM STRL (DRAPES) ×2 IMPLANT
DRAPE SURG 17X23 STRL (DRAPES) ×2 IMPLANT
DRIVER BIT SQUARE 1.7/2.2 (TRAUMA) IMPLANT
DURAPREP 26ML APPLICATOR (WOUND CARE) ×2 IMPLANT
GAUZE SPONGE 4X4 12PLY STRL (GAUZE/BANDAGES/DRESSINGS) ×2 IMPLANT
GLOVE BIO SURGEON STRL SZ7 (GLOVE) ×2 IMPLANT
GLOVE BIOGEL PI IND STRL 7.0 (GLOVE) ×2 IMPLANT
GLOVE BIOGEL PI IND STRL 8 (GLOVE) ×4 IMPLANT
GLOVE ORTHO TXT STRL SZ7.5 (GLOVE) ×2 IMPLANT
GOWN STRL REUS W/ TWL LRG LVL3 (GOWN DISPOSABLE) ×2 IMPLANT
GOWN STRL REUS W/ TWL XL LVL3 (GOWN DISPOSABLE) ×2 IMPLANT
GOWN STRL REUS W/TWL LRG LVL3 (GOWN DISPOSABLE) ×1
GOWN STRL REUS W/TWL XL LVL3 (GOWN DISPOSABLE) ×1
K-WIRE 1.6 (WIRE) ×2
K-WIRE FX5X1.6XNS BN SS (WIRE) ×2
KIT BASIN OR (CUSTOM PROCEDURE TRAY) ×2 IMPLANT
KWIRE FX5X1.6XNS BN SS (WIRE) IMPLANT
NDL HYPO 25X1 1.5 SAFETY (NEEDLE) IMPLANT
NEEDLE HYPO 25X1 1.5 SAFETY (NEEDLE) IMPLANT
NS IRRIG 1000ML POUR BTL (IV SOLUTION) ×2 IMPLANT
PACK ORTHO EXTREMITY (CUSTOM PROCEDURE TRAY) ×2 IMPLANT
PAD CAST 3X4 CTTN HI CHSV (CAST SUPPLIES) IMPLANT
PAD CAST 4YDX4 CTTN HI CHSV (CAST SUPPLIES) IMPLANT
PADDING CAST COTTON 3X4 STRL (CAST SUPPLIES)
PADDING CAST COTTON 4X4 STRL (CAST SUPPLIES) ×2
PEG LOCKING SMOOTH 2.2X14 (Peg) IMPLANT
PEG LOCKING SMOOTH 2.2X18 (Peg) IMPLANT
PEG LOCKING SMOOTH 2.2X20 (Screw) IMPLANT
PLATE BN MN NAR 41X22 CRSLCK (Plate) IMPLANT
PLATE CROSSLOCK NAR MINI LT (Plate) ×1 IMPLANT
SCREW LOCK 14X2.7X 3 LD TPR (Screw) IMPLANT
SCREW LOCKING 2.7X14 (Screw) ×1 IMPLANT
SCREW LOCKING 2.7X15MM (Screw) IMPLANT
SLING ARM IMMOBILIZER MED (SOFTGOODS) IMPLANT
SPIKE FLUID TRANSFER (MISCELLANEOUS) ×2 IMPLANT
SPLINT PLASTER CAST XFAST 5X30 (CAST SUPPLIES) IMPLANT
STOCKINETTE 8 INCH (MISCELLANEOUS) ×2 IMPLANT
STRIP CLOSURE SKIN 1/2X4 (GAUZE/BANDAGES/DRESSINGS) ×2 IMPLANT
SUCTION FRAZIER HANDLE 10FR (MISCELLANEOUS) ×1
SUCTION TUBE FRAZIER 10FR DISP (MISCELLANEOUS) ×2 IMPLANT
SUT NYLON 3 0 (SUTURE) ×4 IMPLANT
SUT VIC AB 2-0 CT2 27 (SUTURE) IMPLANT
SUT VIC AB 2-0 SH 18 (SUTURE) ×2 IMPLANT
SUT VIC AB 3-0 PS2 18 (SUTURE) IMPLANT
SUT VIC AB 3-0 SH 18 (SUTURE) ×2 IMPLANT
SYR BULB EAR ULCER 3OZ GRN STR (SYRINGE) ×2 IMPLANT
SYR CONTROL 10ML LL (SYRINGE) IMPLANT
TOWEL OR 17X26 10 PK STRL BLUE (TOWEL DISPOSABLE) IMPLANT
TUBING CONNECTING 10 (TUBING) ×2 IMPLANT
UNDERPAD 30X36 HEAVY ABSORB (UNDERPADS AND DIAPERS) ×2 IMPLANT

## 2022-10-23 NOTE — Transfer of Care (Signed)
Immediate Anesthesia Transfer of Care Note  Patient: Jacqueline Burke  Procedure(s) Performed: OPEN REDUCTION INTERNAL FIXATION (ORIF) DISTAL RADIUS FRACTURE (Left)  Patient Location: PACU  Anesthesia Type:General  Level of Consciousness: awake, alert , and oriented  Airway & Oxygen Therapy: Patient Spontanous Breathing and Patient connected to face mask oxygen  Post-op Assessment: Report given to RN and Post -op Vital signs reviewed and stable  Post vital signs: Reviewed and stable  Last Vitals:  Vitals Value Taken Time  BP 134/95   Temp    Pulse 93 10/23/22 1628  Resp 14 10/23/22 1628  SpO2 100% 10/23/22 1628  Vitals shown include unvalidated device data.  Last Pain:  Vitals:   10/23/22 1442  TempSrc:   PainSc: 0-No pain         Complications: No notable events documented.

## 2022-10-23 NOTE — Discharge Instructions (Signed)
Diet: As you were doing prior to hospitalization   Shower/Dressing:  May shower but keep the wounds dry, use an occlusive plastic wrap, NO SOAKING IN TUB. Leave the splint in place and keep the splint dry with a plastic bag. Leave the splint in place and we will change your bandages during your first follow-up appointment.    Activity:  Increase activity slowly as tolerated, but follow the weight bearing instructions below.  The rules on driving is that you can not be taking narcotics while you drive, and you must feel in control of the vehicle.    Weight Bearing:  No bearing weight on left arm.   To prevent constipation: you may use a stool softener such as -  Colace (over the counter) 100 mg by mouth twice a day  Drink plenty of fluids (prune juice may be helpful) and high fiber foods Miralax (over the counter) for constipation as needed.    Itching:  If you experience itching with your medications, try taking only a single pain pill, or even half a pain pill at a time.  You may take up to 10 pain pills per day, and you can also use benadryl over the counter for itching or also to help with sleep.   Precautions:  If you experience chest pain or shortness of breath - call 911 immediately for transfer to the hospital emergency department!!  If you develop a fever greater that 101 F, purulent drainage from wound, increased redness or drainage from wound, or calf pain -- Call the office at [phone]                                                Follow- Up Appointment:  Please call for an appointment to be seen in 2 weeks [place] - [phone]

## 2022-10-23 NOTE — Interval H&P Note (Signed)
History and Physical Interval Note:  10/23/2022 2:41 PM  Jacqueline Burke  has presented today for surgery, with the diagnosis of left distal radius fracture.  The various methods of treatment have been discussed with the patient and family. After consideration of risks, benefits and other options for treatment, the patient has consented to  Procedure(s): OPEN REDUCTION INTERNAL FIXATION (ORIF) DISTAL RADIUS FRACTURE (Left) as a surgical intervention.  The patient's history has been reviewed, patient examined, no change in status, stable for surgery.  I have reviewed the patient's chart and labs.  Questions were answered to the patient's satisfaction.     Eulas Post

## 2022-10-23 NOTE — Anesthesia Procedure Notes (Signed)
Anesthesia Regional Block: Supraclavicular block   Pre-Anesthetic Checklist: , timeout performed,  Correct Patient, Correct Site, Correct Laterality,  Correct Procedure, Correct Position, site marked,  Risks and benefits discussed,  Surgical consent,  Pre-op evaluation,  At surgeon's request and post-op pain management  Laterality: Left  Prep: chloraprep       Needles:  Injection technique: Single-shot  Needle Type: Echogenic Stimulator Needle     Needle Length: 9cm  Needle Gauge: 21     Additional Needles:   Procedures:,,,, ultrasound used (permanent image in chart),,    Narrative:  Start time: 10/23/2022 2:10 PM End time: 10/23/2022 2:20 PM Injection made incrementally with aspirations every 5 mL.  Performed by: Personally  Anesthesiologist: Leonides Grills, MD  Additional Notes: Functioning IV was confirmed and monitors were applied.  A timeout was performed. Sterile prep, hand hygiene and sterile gloves were used. A 90mm 21ga Arrow echogenic stimulator needle was used. Negative aspiration and negative test dose prior to incremental administration of local anesthetic. The patient tolerated the procedure well.  Ultrasound guidance: relevent anatomy identified, needle position confirmed, local anesthetic spread visualized around nerve(s), vascular puncture avoided.  Image printed for medical record.

## 2022-10-23 NOTE — Anesthesia Postprocedure Evaluation (Signed)
Anesthesia Post Note  Patient: Jacqueline Burke  Procedure(s) Performed: OPEN REDUCTION INTERNAL FIXATION (ORIF) DISTAL RADIUS FRACTURE (Left)     Patient location during evaluation: PACU Anesthesia Type: Regional and General Level of consciousness: awake and alert Pain management: pain level controlled Vital Signs Assessment: post-procedure vital signs reviewed and stable Respiratory status: spontaneous breathing, nonlabored ventilation and respiratory function stable Cardiovascular status: blood pressure returned to baseline and stable Postop Assessment: no apparent nausea or vomiting Anesthetic complications: no  No notable events documented.  Last Vitals:  Vitals:   10/23/22 1655 10/23/22 1658  BP:  114/69  Pulse: 63 63  Resp: 19 (!) 21  Temp:  36.7 C  SpO2: 91% 93%    Last Pain:  Vitals:   10/23/22 1658  TempSrc:   PainSc: 0-No pain                 [name],W. EDMOND

## 2022-10-23 NOTE — H&P (Signed)
PREOPERATIVE H&P  Chief Complaint: left wrist pain  HPI: Jacqueline Burke is a 87 y.o. female who presents for preoperative history and physical with a diagnosis of left distal radius fracture. She fell at home on 10/21/22 , she was putting groceries away when her foot caught something and she fell forward. Severe left wrist pain after fall. She is on plavix at baseline. She is right hand dominant. Symptoms are rated as moderate to severe, worse with movement and better with rest.  She has elected for surgical management.   Past Medical History:  Diagnosis Date   Allergic rhinitis    CAD (coronary artery disease), native coronary artery    NSTEMI with 95% RCA s/p DES to RCA 06/2017   Complication of anesthesia    makes her confused cursing etc..   Diverticulosis    GERD (gastroesophageal reflux disease)    Heart murmur    Systolic heart murmur with Aortic valve sclerosis by ECHO   History of kidney stones    Hyperlipidemia    Hypertension    Myocardial infarction (HCC)    Osteoarthritis    Erosive OA-MRI of R hand negative for synovitis, only OA-Dr Nickola Major   Osteopenia    Pelvic prolapse    PVC's (premature ventricular contractions)    Past Surgical History:  Procedure Laterality Date   BUNIONECTOMY  08/2006   R foot and hammer roe right second toe   CARDIAC CATHETERIZATION     CARPAL TUNNEL RELEASE  2002   bil hands   CONVERSION TO TOTAL HIP Left 08/08/2022   Procedure: CONVERSION TO TOTAL HIP FROM IM NAIL;  Surgeon: Joen Laura, MD;  Location: [facility];  Service: Orthopedics;  Laterality: Left;   CORONARY STENT INTERVENTION N/A 06/15/2017   Procedure: CORONARY STENT INTERVENTION;  Surgeon: Runell Gess, MD;  Location: [facility];  Service: Cardiovascular;  Laterality: N/A;   CYSTOSCOPY     with laser lithotripsy Dr. Alvester Morin 07-20-17    CYSTOSCOPY W/ URETERAL STENT PLACEMENT Left 06/09/2017   Procedure: CYSTOSCOPY WITH RETROGRADE PYELOGRAM/URETERAL STENT PLACEMENT;   Surgeon: Crista Elliot, MD;  Location: [facility];  Service: Urology;  Laterality: Left;   CYSTOSCOPY WITH RETROGRADE PYELOGRAM, URETEROSCOPY AND STENT PLACEMENT Left 07/20/2017   Procedure: CYSTOSCOPY WITH RETROGRADE PYELOGRAM, LEFT URETEROSCOPY HOLMIUM LASER LITHO  AND STENT EXCHANGE;  Surgeon: Crista Elliot, MD;  Location: [facility];  Service: Urology;  Laterality: Left;   HOLMIUM LASER APPLICATION Left 07/20/2017   Procedure: HOLMIUM LASER APPLICATION;  Surgeon: Crista Elliot, MD;  Location: [facility];  Service: Urology;  Laterality: Left;   INTRAMEDULLARY (IM) NAIL INTERTROCHANTERIC Left 08/04/2020   Procedure: INTRAMEDULLARY (IM) NAIL INTERTROCHANTRIC WITH CABLES;  Surgeon: Cammy Copa, MD;  Location: [facility];  Service: Orthopedics;  Laterality: Left;   LEFT HEART CATH AND CORONARY ANGIOGRAPHY N/A 06/15/2017   Procedure: LEFT HEART CATH AND CORONARY ANGIOGRAPHY;  Surgeon: Runell Gess, MD;  Location: [facility];  Service: Cardiovascular;  Laterality: N/A;   TOTAL KNEE ARTHROPLASTY Left 08/2007   TOTAL KNEE ARTHROPLASTY Right 03/2010   Social History   Socioeconomic History   Marital status: Married    Spouse name: Not on file   Number of children: Not on file   Years of education: Not on file   Highest education level: Not on file  Occupational History   Occupation: Retired  Tobacco Use   Smoking status: Former    Packs/day: 1.00  Years: 25.00    Additional pack years: 0.00    Total pack years: 25.00    Types: Cigarettes    Quit date: 06/02/1993    Years since quitting: 29.4   Smokeless tobacco: Never  Vaping Use   Vaping Use: Never used  Substance and Sexual Activity   Alcohol use: No   Drug use: No   Sexual activity: Not Currently  Other Topics Concern   Not on file  Social History Narrative   Not on file   Social Determinants of Health   Financial Resource Strain: Not on file  Food Insecurity: No Food Insecurity (08/08/2022)   Hunger Vital Sign     Worried About Running Out of Food in the Last Year: Never true    Ran Out of Food in the Last Year: Never true  Transportation Needs: No Transportation Needs (08/08/2022)   PRAPARE - Administrator, Civil Service (Medical): No    Lack of Transportation (Non-Medical): No  Physical Activity: Inactive (10/29/2017)   Exercise Vital Sign    Days of Exercise per Week: 0 days    Minutes of Exercise per Session: 0 min  Stress: No Stress Concern Present (10/29/2017)   [employer] of Occupational Health - Occupational Stress Questionnaire    Feeling of Stress : Only a little  Social Connections: Not on file   Family History  Problem Relation Age of Onset   Stroke Mother    Diabetes Mother    Heart attack Mother    Osteoporosis Father    Breast cancer Cousin    Allergies  Allergen Reactions   Ciprofloxacin Hives   Codeine Hives   Demerol [Meperidine] Hives   Hydrogen Peroxide Itching and Other (See Comments)    White blisters   Propoxyphene Other (See Comments)    Unknown Other reaction(s): OTHER   Tramadol Other (See Comments)    "ITCHY ON THE INSIDE"   Prior to Admission medications   Medication Sig Start Date End Date Taking? Authorizing Provider  acetaminophen (TYLENOL) 325 MG tablet Take 325 mg by mouth at bedtime.    [provider]  Albuterol Sulfate (PROAIR RESPICLICK) 108 (90 Base) MCG/ACT AEPB Inhale 1 puff into the lungs daily as needed (wheezing). 08/29/20   Medina-Vargas, Monina C, NP  amLODipine (NORVASC) 2.5 MG tablet TAKE 1 TABLET BY MOUTH ONCE DAILY IN  ADDITION  TO  YOUR  5  MG  TABLET  DAILY  (TOTAL  7.5  MG) 10/20/22   Turner, Traci R, MD  amLODipine (NORVASC) 5 MG tablet TAKE 1 TABLET BY MOUTH ONCE DAILY IN  ADDITION  TO  1  TABLET  OF  2.5  MG  FOR  A  TOTAL  OF  7.5  MG 06/10/22   Turner, Cornelious Bryant, MD  atorvastatin (LIPITOR) 80 MG tablet Take 1 tablet (80 mg total) by mouth daily at 6 PM. 07/21/22   Turner, Cornelious Bryant, MD  Camphor-Menthol-Methyl  Sal 3.06-07-08 % PTCH at bedtime.    [provider]  cetirizine (ZYRTEC) 10 MG chewable tablet Chew 1 tablet (10 mg total) by mouth daily. 08/29/20   Medina-Vargas, Monina C, NP  Cholecalciferol (VITAMIN D PO) Take 3,000 Units by mouth daily.     [provider]  clopidogrel (PLAVIX) 75 MG tablet Take 1 tablet (75 mg total) by mouth daily. 08/10/22   Joen Laura, MD  Cyanocobalamin 5000 MCG TBDP Take 5,000 mcg by mouth daily.    [provider]  docusate sodium (COLACE) 100 MG capsule Take 100 mg by mouth daily as needed.    [provider]  fluticasone (FLONASE) 50 MCG/ACT nasal spray Place 2 sprays into both nostrils daily. Patient taking differently: Place 2 sprays into both nostrils daily as needed for rhinitis. 08/29/20   Medina-Vargas, Monina C, NP  gabapentin (NEURONTIN) 300 MG capsule Take 2 capsules (600 mg total) by mouth daily. Patient taking differently: Take 300 mg by mouth daily as needed (Pain). 08/29/20   Medina-Vargas, Monina C, NP  guaiFENesin (MUCINEX) 600 MG 12 hr tablet Take 1 tablet (600 mg total) by mouth 2 (two) times daily. Patient taking differently: Take 600 mg by mouth 2 (two) times daily as needed for to loosen phlegm. 08/29/20   Medina-Vargas, Monina C, NP  HYDROcodone-acetaminophen (NORCO/VICODIN) 5-325 MG tablet Take 1-2 tablets by mouth every 6 (six) hours as needed for moderate pain or severe pain. 10/21/22   Cathren Laine, MD  metoprolol tartrate (LOPRESSOR) 25 MG tablet Take 1 tablet (25 mg total) by mouth 2 (two) times daily. 04/03/21   Quintella Reichert, MD  Multiple Vitamin (MULTIVITAMIN) capsule Take 1 capsule by mouth daily.    [provider]  ondansetron (ZOFRAN-ODT) 8 MG disintegrating tablet Take 1 tablet (8 mg total) by mouth every 8 (eight) hours as needed for nausea or vomiting. 10/21/22   Cathren Laine, MD  PROLIA 60 MG/ML SOSY injection Inject 60 mg into the skin every 6 (six) months. 02/06/21   [provider]  Tiotropium Bromide Monohydrate (SPIRIVA RESPIMAT) 1.25 MCG/ACT AERS Inhale 2 each into the lungs daily as needed (Congestion). 10/16/20   [provider]  traMADol (ULTRAM) 50 MG tablet Take 50 mg by mouth every 8 (eight) hours as needed. 07/28/22   [provider]  Trospium Chloride 60 MG CP24 Take 1 capsule (60 mg total) by mouth daily. 08/29/20   Medina-Vargas, Monina C, NP     Positive ROS: All other systems have been reviewed and were otherwise negative with the exception of those mentioned in the HPI and as above.  Physical Exam: General: Alert, no acute distress Cardiovascular: No pedal edema Respiratory: No cyanosis, no use of accessory musculature GI: No organomegaly, abdomen is soft and non-tender Skin: No lesions in the area of chief complaint Neurologic: Sensation intact distally Psychiatric: Patient is competent for consent with normal mood and affect Lymphatic: No axillary or cervical lymphadenopathy  MUSCULOSKELETAL: left wrist in splint. Able to flex, extend, and abduct all fingers of left hand. Distal sensation intact. Cap refill intact.   Xrays of left wrist taken in ED show comminuted left distal radius fracture  Assessment: Left distal radius fracture   Plan: Plan for Procedure(s): OPEN REDUCTION INTERNAL FIXATION (ORIF) DISTAL RADIUS FRACTURE  The risks benefits and alternatives were discussed with the patient including but not limited to the risks of nonoperative treatment, versus surgical intervention including infection, bleeding, nerve injury,  blood clots, cardiopulmonary complications, morbidity, mortality, among others, and they were willing to proceed.   Armida Sans, PA-C    10/23/2022 10:13 AM

## 2022-10-23 NOTE — Progress Notes (Signed)
Patient aware to arrive at 1245 for a 1500 surgery

## 2022-10-23 NOTE — Op Note (Signed)
10/23/2022  4:14 PM  PATIENT:  Jacqueline Burke    PRE-OPERATIVE DIAGNOSIS:  left distal radius fracture, 3 pieces  POST-OPERATIVE DIAGNOSIS:  Same  PROCEDURE:  ORIF DISTAL RADIUS FRACTURE, 3 PIECES  SURGEON:  Eulas Post, MD  PHYSICIAN ASSISTANT: Janine Ores, PA-C, present and scrubbed throughout the case, critical for completion in a timely fashion, and for retraction, instrumentation, and closure.  ANESTHESIA:   General  ESTIMATED BLOOD LOSS: minimal  PREOPERATIVE INDICATIONS:  [name] is a  87 y.o. female with a diagnosis of left distal radius fracture who elected for surgical management due to fracture displacement.    The risks benefits and alternatives were discussed with the patient preoperatively including but not limited to the risks of infection, bleeding, nerve injury, cardiopulmonary complications, the need for revision surgery, tendon rupture, hardware prominence, hardware failure, nonunion, malunion, post-traumatic arthritis, regional pain syndrome, among others, and the patient was willing to proceed.  OPERATIVE IMPLANTS: Biomet DVR volar plate with 3 proximal cortical screws and multiple distal interlocking smooth pegs, using the short narrow plate.   OPERATIVE FINDINGS: Comminution of the distal radius fracture.  UNIQUE ASPECTS OF THE CASE:  the distal most shaft screw was a little bit of a spinner and I suspect I was not quite bicortical, so I put in an additional locking screw.    OPERATIVE PROCEDURE: The patient was brought to the operating room and placed in the supine position. General anesthesia was administered. IV antibiotics were given. Time out was performed. The upper extremity was prepped and draped in usual sterile fashion. The arm was elevated and exsanguinated and the tourniquet was inflated at hg.    Volar approach to the distal radius was carried out, and the flexor carpi radialis was retracted radially. The radial artery was protected  throughout the case.  Deep dissection was carried down, and the pronator quadratus was elevated off of the radius. The fracture site was identified and cleaned and reduced anatomically. This keyed into place nicely.   I held this provisionally with a K wire, and C-arm used to confirm alignment.  I had restored height and inclination and then applied a volar plate. A K wire was used to confirm appropriate position of the plate, and once I was satisfied with the overall alignment I was able to secure the plate proximally with a cortical screw.   I then secured the fracture with multiple smooth interlocking pegs distally, and confirmed that none of these were in the joint, and none of these were penetrating the dorsal cortex. I also secured the plate proximally with one more cortical screw. On the ulnar side of the plate distally I used shorter pegs than on the radial side.    The wounds were irrigated copiously, and performed routine closure for the skin and sterile gauze and a volar splint. The tourniquet was released. She was awakened and returned back in stable and satisfactory condition. There were no complications and She tolerated the procedure well.

## 2022-10-23 NOTE — Anesthesia Preprocedure Evaluation (Addendum)
Anesthesia Evaluation  Patient identified by MRN, date of birth, ID band Patient awake    Reviewed: Allergy & Precautions, NPO status , Patient's Chart, lab work & pertinent test results  Airway Mallampati: II  TM Distance: >3 FB Neck ROM: Full    Dental no notable dental hx.    Pulmonary COPD, former smoker   Pulmonary exam normal        Cardiovascular hypertension, Pt. on medications and Pt. on home beta blockers + CAD, + Past MI and + Cardiac Stents  Normal cardiovascular exam     Neuro/Psych negative neurological ROS  negative psych ROS   GI/Hepatic negative GI ROS, Neg liver ROS,,,  Endo/Other  negative endocrine ROS    Renal/GU Renal disease     Musculoskeletal  (+) Arthritis ,    Abdominal   Peds  Hematology  (+) Blood dyscrasia (Plavix), anemia   Anesthesia Other Findings left distal radius fracture  Reproductive/Obstetrics                              Anesthesia Physical Anesthesia Plan  ASA: 3  Anesthesia Plan: Regional and General   Post-op Pain Management: Regional block*   Induction: Intravenous  PONV Risk Score and Plan: 3 and Ondansetron, Dexamethasone and Treatment may vary due to age or medical condition  Airway Management Planned: LMA  Additional Equipment:   Intra-op Plan:   Post-operative Plan: Extubation in OR  Informed Consent: I have reviewed the patients History and Physical, chart, labs and discussed the procedure including the risks, benefits and alternatives for the proposed anesthesia with the patient or authorized representative who has indicated his/her understanding and acceptance.     Dental advisory given  Plan Discussed with: CRNA  Anesthesia Plan Comments:          Anesthesia Quick Evaluation

## 2022-10-23 NOTE — Anesthesia Procedure Notes (Signed)
Procedure Name: LMA Insertion Date/Time: 10/23/2022 3:14 PM  Performed by: Nelle Don, CRNAPre-anesthesia Checklist: Patient identified, Emergency Drugs available, Suction available and Patient being monitored Patient Re-evaluated:Patient Re-evaluated prior to induction Oxygen Delivery Method: Circle system utilized Preoxygenation: Pre-oxygenation with 100% oxygen Induction Type: IV induction LMA: LMA with gastric port inserted LMA Size: 3.0 Number of attempts: 1 Dental Injury: Teeth and Oropharynx as per pre-operative assessment

## 2022-10-24 ENCOUNTER — Encounter (HOSPITAL_COMMUNITY): Payer: Self-pay | Admitting: Orthopedic Surgery

## 2022-10-28 ENCOUNTER — Other Ambulatory Visit: Payer: Self-pay

## 2022-11-04 ENCOUNTER — Other Ambulatory Visit: Payer: Self-pay | Admitting: Cardiology

## 2022-11-04 DIAGNOSIS — M1652 Unilateral post-traumatic osteoarthritis, left hip: Secondary | ICD-10-CM | POA: Diagnosis not present

## 2022-11-04 DIAGNOSIS — I10 Essential (primary) hypertension: Secondary | ICD-10-CM

## 2022-11-05 DIAGNOSIS — S52532D Colles' fracture of left radius, subsequent encounter for closed fracture with routine healing: Secondary | ICD-10-CM | POA: Diagnosis not present

## 2022-11-28 DIAGNOSIS — N3941 Urge incontinence: Secondary | ICD-10-CM | POA: Diagnosis not present

## 2022-12-03 DIAGNOSIS — S52532D Colles' fracture of left radius, subsequent encounter for closed fracture with routine healing: Secondary | ICD-10-CM | POA: Diagnosis not present

## 2022-12-29 DIAGNOSIS — R29890 Loss of height: Secondary | ICD-10-CM | POA: Diagnosis not present

## 2022-12-29 DIAGNOSIS — N958 Other specified menopausal and perimenopausal disorders: Secondary | ICD-10-CM | POA: Diagnosis not present

## 2022-12-29 DIAGNOSIS — M8588 Other specified disorders of bone density and structure, other site: Secondary | ICD-10-CM | POA: Diagnosis not present

## 2022-12-31 DIAGNOSIS — S52532D Colles' fracture of left radius, subsequent encounter for closed fracture with routine healing: Secondary | ICD-10-CM | POA: Diagnosis not present

## 2023-01-15 DIAGNOSIS — M810 Age-related osteoporosis without current pathological fracture: Secondary | ICD-10-CM | POA: Diagnosis not present

## 2023-01-15 DIAGNOSIS — R5383 Other fatigue: Secondary | ICD-10-CM | POA: Diagnosis not present

## 2023-01-16 ENCOUNTER — Other Ambulatory Visit: Payer: Self-pay | Admitting: Pharmacy Technician

## 2023-01-16 DIAGNOSIS — M810 Age-related osteoporosis without current pathological fracture: Secondary | ICD-10-CM | POA: Insufficient documentation

## 2023-01-20 ENCOUNTER — Telehealth: Payer: Self-pay | Admitting: Pharmacy Technician

## 2023-01-20 NOTE — Telephone Encounter (Signed)
Auth Submission: NO AUTH NEEDED/APPROVED Site of care: Site of care: [facility] Payer: DEVOTED Medication & CPT/J Code(s) submitted: Reclast (Zolendronic acid) [id number] Route of submission (phone, fax, portal): [facility] Phone # Fax # Auth type: Buy/Bill PB Units/visits requested: 1 Reference number: [id number] Approval from: 01/20/23 to 06/02/23

## 2023-01-22 ENCOUNTER — Ambulatory Visit: Payer: No Typology Code available for payment source

## 2023-01-26 DIAGNOSIS — I2510 Atherosclerotic heart disease of native coronary artery without angina pectoris: Secondary | ICD-10-CM | POA: Diagnosis not present

## 2023-01-26 DIAGNOSIS — R413 Other amnesia: Secondary | ICD-10-CM | POA: Diagnosis not present

## 2023-01-26 DIAGNOSIS — N3946 Mixed incontinence: Secondary | ICD-10-CM | POA: Diagnosis not present

## 2023-01-26 DIAGNOSIS — I10 Essential (primary) hypertension: Secondary | ICD-10-CM | POA: Diagnosis not present

## 2023-01-26 DIAGNOSIS — Z23 Encounter for immunization: Secondary | ICD-10-CM | POA: Diagnosis not present

## 2023-01-26 DIAGNOSIS — M810 Age-related osteoporosis without current pathological fracture: Secondary | ICD-10-CM | POA: Diagnosis not present

## 2023-01-26 DIAGNOSIS — I700 Atherosclerosis of aorta: Secondary | ICD-10-CM | POA: Diagnosis not present

## 2023-01-26 DIAGNOSIS — J849 Interstitial pulmonary disease, unspecified: Secondary | ICD-10-CM | POA: Diagnosis not present

## 2023-01-27 ENCOUNTER — Telehealth: Payer: Self-pay | Admitting: Cardiology

## 2023-01-27 DIAGNOSIS — S52532D Colles' fracture of left radius, subsequent encounter for closed fracture with routine healing: Secondary | ICD-10-CM | POA: Diagnosis not present

## 2023-01-27 DIAGNOSIS — M810 Age-related osteoporosis without current pathological fracture: Secondary | ICD-10-CM | POA: Diagnosis not present

## 2023-01-27 NOTE — Telephone Encounter (Signed)
Pt c/o medication issue:  1. Name of Medication:   amLODipine (NORVASC) 2.5 MG tablet    amLODipine (NORVASC) 5 MG table    2. How are you currently taking this medication (dosage and times per day)?   TAKE 1 TABLET BY MOUTH ONCE DAILY IN ADDITION TO YOUR 5 MG TABLET DAILY (TOTAL 7.5 MG)    TAKE 1 TABLET BY MOUTH ONCE DAILY IN ADDITION TO 1 TABLET OF 2.5 MG FOR A TOTAL OF 7.5 MG   3. Are you having a reaction (difficulty breathing--STAT)? No  4. What is your medication issue? Pt's PCP calling to request that above medication dosage be changed to either 2.5 MG or 5 MG being that pt's BP was 120/69 at yesterdays visit. Please advise

## 2023-01-27 NOTE — Telephone Encounter (Signed)
Left message for patient to return call to office.  Left voicemail for patient to return call to office to discuss blood pressure

## 2023-01-28 NOTE — Telephone Encounter (Signed)
Spoke with patient and she is aware to continue amlodipine  7.5 mg. Take BP twice daily for a week. She will call us 9/5 with readings. She verbalized understanding.

## 2023-01-28 NOTE — Telephone Encounter (Signed)
Shirly from [facility] calling stating patient has lost 40lbs over the last 4 years and had multiple trips to the skilled nursing facility. She has also had a decreased appetite and feels she needs less of the BP medication and would like to decrease amlodipine. She would like to know what you think as well.

## 2023-01-29 ENCOUNTER — Ambulatory Visit: Payer: No Typology Code available for payment source

## 2023-01-29 VITALS — BP 154/76 | HR 54 | Temp 97.5°F | Resp 16 | Ht 61.0 in | Wt 125.8 lb

## 2023-01-29 DIAGNOSIS — M810 Age-related osteoporosis without current pathological fracture: Secondary | ICD-10-CM | POA: Diagnosis not present

## 2023-01-29 MED ORDER — ZOLEDRONIC ACID 5 MG/100ML IV SOLN
5.0000 mg | Freq: Once | INTRAVENOUS | Status: AC
  Administered 2023-01-29: 5 mg via INTRAVENOUS
  Filled 2023-01-29: qty 100

## 2023-01-29 MED ORDER — ACETAMINOPHEN 325 MG PO TABS
650.0000 mg | ORAL_TABLET | Freq: Once | ORAL | Status: AC
  Administered 2023-01-29: 650 mg via ORAL
  Filled 2023-01-29: qty 2

## 2023-01-29 MED ORDER — DIPHENHYDRAMINE HCL 25 MG PO CAPS
25.0000 mg | ORAL_CAPSULE | Freq: Once | ORAL | Status: AC
  Administered 2023-01-29: 25 mg via ORAL
  Filled 2023-01-29: qty 1

## 2023-01-29 NOTE — Progress Notes (Signed)
Diagnosis: Osteoporosis  Provider:  Chilton Greathouse MD  Procedure: IV Infusion  IV Type: Peripheral, IV Location: L Antecubital  Reclast (Zolendronic Acid), Dose: 5 mg  Infusion Start Time: 1203  Infusion Stop Time: 1232  Post Infusion IV Care: Observation period completed and Peripheral IV Discontinued  Discharge: Condition: Good, Destination: Home . AVS Provided  Performed by:  Adriana Mccallum, RN

## 2023-01-29 NOTE — Patient Instructions (Signed)

## 2023-02-03 DIAGNOSIS — M25552 Pain in left hip: Secondary | ICD-10-CM | POA: Diagnosis not present

## 2023-02-06 ENCOUNTER — Telehealth: Payer: Self-pay | Admitting: Cardiology

## 2023-02-06 NOTE — Telephone Encounter (Signed)
Pt c/o BP issue: STAT if pt c/o blurred vision, one-sided weakness or slurred speech  1. What are your last 5 BP readings?  8/28: 152/60 66, 137/69 68 8/29: 164/70 69, 140/68 65 8/30: 132/67 65, 152/92 73 8/31: 134/64 59, 143/76 78 9/01:144/87 77, 159/74 64 9/02: 111/65 66, 133/77 66 9/03: 121/66 67, 151/67 59 9/04: 124/63 63, 118/60 56 9/05: 116/54 73, 124/70 60 9/06: 129/78 62   2. Are you having any other symptoms (ex. Dizziness, headache, blurred vision, passed out)?  No   3. What is your BP issue?   Patient is calling report BP readings.

## 2023-02-09 NOTE — Telephone Encounter (Signed)
Call to patient to advise that Dr. Mayford Knife has reviewed BP log and states BP's are for the most part good. Patient verbalizes understanding to continue her current medications.

## 2023-02-18 DIAGNOSIS — F17211 Nicotine dependence, cigarettes, in remission: Secondary | ICD-10-CM | POA: Diagnosis not present

## 2023-02-18 DIAGNOSIS — N182 Chronic kidney disease, stage 2 (mild): Secondary | ICD-10-CM | POA: Diagnosis not present

## 2023-02-18 DIAGNOSIS — I2510 Atherosclerotic heart disease of native coronary artery without angina pectoris: Secondary | ICD-10-CM | POA: Diagnosis not present

## 2023-02-18 DIAGNOSIS — R2681 Unsteadiness on feet: Secondary | ICD-10-CM | POA: Diagnosis not present

## 2023-02-18 DIAGNOSIS — I739 Peripheral vascular disease, unspecified: Secondary | ICD-10-CM | POA: Diagnosis not present

## 2023-02-18 DIAGNOSIS — E663 Overweight: Secondary | ICD-10-CM | POA: Diagnosis not present

## 2023-02-18 DIAGNOSIS — R7303 Prediabetes: Secondary | ICD-10-CM | POA: Diagnosis not present

## 2023-02-18 DIAGNOSIS — J449 Chronic obstructive pulmonary disease, unspecified: Secondary | ICD-10-CM | POA: Diagnosis not present

## 2023-02-18 DIAGNOSIS — M810 Age-related osteoporosis without current pathological fracture: Secondary | ICD-10-CM | POA: Diagnosis not present

## 2023-02-18 DIAGNOSIS — E785 Hyperlipidemia, unspecified: Secondary | ICD-10-CM | POA: Diagnosis not present

## 2023-02-18 DIAGNOSIS — D692 Other nonthrombocytopenic purpura: Secondary | ICD-10-CM | POA: Diagnosis not present

## 2023-02-18 DIAGNOSIS — Z6825 Body mass index (BMI) 25.0-25.9, adult: Secondary | ICD-10-CM | POA: Diagnosis not present

## 2023-02-18 DIAGNOSIS — I129 Hypertensive chronic kidney disease with stage 1 through stage 4 chronic kidney disease, or unspecified chronic kidney disease: Secondary | ICD-10-CM | POA: Diagnosis not present

## 2023-02-18 DIAGNOSIS — Z008 Encounter for other general examination: Secondary | ICD-10-CM | POA: Diagnosis not present

## 2023-02-27 DIAGNOSIS — R350 Frequency of micturition: Secondary | ICD-10-CM | POA: Diagnosis not present

## 2023-02-27 DIAGNOSIS — N3941 Urge incontinence: Secondary | ICD-10-CM | POA: Diagnosis not present

## 2023-03-10 DIAGNOSIS — M810 Age-related osteoporosis without current pathological fracture: Secondary | ICD-10-CM | POA: Diagnosis not present

## 2023-03-10 DIAGNOSIS — R5383 Other fatigue: Secondary | ICD-10-CM | POA: Diagnosis not present

## 2023-04-13 ENCOUNTER — Encounter: Payer: Self-pay | Admitting: Cardiology

## 2023-04-13 ENCOUNTER — Ambulatory Visit: Payer: No Typology Code available for payment source | Attending: Cardiology | Admitting: Cardiology

## 2023-04-13 VITALS — BP 130/82 | HR 67 | Resp 16 | Ht 61.0 in | Wt 126.8 lb

## 2023-04-13 DIAGNOSIS — E785 Hyperlipidemia, unspecified: Secondary | ICD-10-CM | POA: Diagnosis not present

## 2023-04-13 DIAGNOSIS — I10 Essential (primary) hypertension: Secondary | ICD-10-CM | POA: Diagnosis not present

## 2023-04-13 DIAGNOSIS — I214 Non-ST elevation (NSTEMI) myocardial infarction: Secondary | ICD-10-CM

## 2023-04-13 DIAGNOSIS — I493 Ventricular premature depolarization: Secondary | ICD-10-CM

## 2023-04-13 DIAGNOSIS — I2510 Atherosclerotic heart disease of native coronary artery without angina pectoris: Secondary | ICD-10-CM

## 2023-04-13 MED ORDER — METOPROLOL TARTRATE 25 MG PO TABS
12.5000 mg | ORAL_TABLET | Freq: Two times a day (BID) | ORAL | Status: DC
Start: 2023-04-13 — End: 2023-10-23

## 2023-04-13 MED ORDER — NITROGLYCERIN 0.4 MG SL SUBL: 0.4000 mg | SUBLINGUAL_TABLET | SUBLINGUAL | 1 refills | Status: DC | PRN

## 2023-04-13 NOTE — Progress Notes (Signed)
Cardiology Office Note:   Date:  04/13/2023  ID:  Jacqueline Burke, DOB Mar 17, 1936, MRN [id number] PCP: Shon Hale, MD  [facility] Providers Cardiologist:  Armanda Magic, MD    History of Present Illness:   Discussed the use of AI scribe software for clinical note transcription with the patient, who gave verbal consent to proceed.     Jacqueline Burke is a 87 y.o. female with a hx of hypertension, asymptomatic PVCs, ASCAD s/p NSTEMI with peak troponin of 19 in setting of acute respiratory failure and E Coli bacteremia in January 2019. 2D echo with normal LVEF, elevated LVEDP and cath with  95% RCA stenosis s/p PCI of the RCA with DES and normal EF, EF 55-65% s/p DES to RCA.   Patient presents for routine follow up today. She reports a gradual increase in exertional chest discomfort and shortness of breath over the past year. The discomfort, described as a 'shooting pain,' is triggered by physical exertion and is relieved by rest. She also has felt this pain when stressed. She helps rescue boxers and says that sometimes the stress of this seems to trigger the chest pain. The patient remains active despite her age and medical conditions, engaging in yard work and house maintenance. Patient reports no orthopnea, dizziness, lightheadedness. She has mild LE edema which is worse on days when she's on her feet a lot.   In addition to the cardiac symptoms, the patient has experienced multiple falls and fractures over the past few years. Three years ago, she sustained a fall resulting in a broken femur and hip, and a T12 vertebral fracture that was not initially diagnosed. More recently, she fell in the kitchen and broke her wrist. The patient has been on Prolia for a year and a half for bone density management, but reports that her bone density continues to drop despite the medication. She has recently started self-injections of a different medication to support bone health and regularly sees  her orthopedist.   Studies Reviewed:    EKG:   EKG Interpretation Date/Time:  Monday April 13 2023 11:41:43 EST Ventricular Rate:  69 PR Interval:  234 QRS Duration:  80 QT Interval:  372 QTC Calculation: 398 R Axis:   7  Text Interpretation: Sinus rhythm with 1st degree A-V block When compared with ECG of 04-Aug-2020 08:08, PR interval has increased Questionable change in initial forces of Anteroseptal leads Confirmed by Perlie Gold [phone]) on 04/13/2023 11:46:18 AM   Risk Assessment/Calculations:              Physical Exam:   VS:  BP 130/82 (BP Location: Left Arm, Patient Position: Sitting, Cuff Size: Normal)   Pulse 67   Resp 16   Ht 5\' 1"  (1.549 m)   Wt 126 lb 12.8 oz (57.5 kg)   SpO2 93%   BMI 23.96 kg/m    Wt Readings from Last 3 Encounters:  04/13/23 126 lb 12.8 oz (57.5 kg)  01/29/23 125 lb 12.8 oz (57.1 kg)  10/23/22 123 lb 7.3 oz (56 kg)     Physical Exam Vitals reviewed.  Constitutional:      Appearance: Normal appearance.  HENT:     Head: Normocephalic.     Nose: Nose normal.  Eyes:     Pupils: Pupils are equal, round, and reactive to light.  Cardiovascular:     Rate and Rhythm: Normal rate and regular rhythm.     Pulses: Normal pulses.  Heart sounds: Normal heart sounds. No murmur heard.    No friction rub. No gallop.  Pulmonary:     Effort: Pulmonary effort is normal.     Breath sounds: Normal breath sounds.  Abdominal:     General: Abdomen is flat.     Tenderness: Tenderness: [id number].  Musculoskeletal:     Right lower leg: Edema (trace) present.     Left lower leg: Edema (trace) present.  Skin:    General: Skin is warm and dry.     Capillary Refill: Capillary refill takes less than 2 seconds.  Neurological:     General: No focal deficit present.     Mental Status: She is alert and oriented to person, place, and time.  Psychiatric:        Mood and Affect: Mood normal.        Behavior: Behavior normal.        Thought Content:  Thought content normal.        Judgment: Judgment normal.     ASSESSMENT AND PLAN:     Assessment and Plan    Stable Angina with CAD and prior PCI to RCA Intermittent chest discomfort and dyspnea with exertion, consistent with stable angina. Symptoms are predictable and improve with rest. Slight increase in symptom frequency since her last OV. ECG without acute ischemic changes today. No stress test since 2019. Discussed stress test to evaluate perfusion changes, patient agreeable. - Order myoview stress test - Prescribe nitroglycerin for emergency chest pain use - Continue atorvastatin 80 mg daily - Continue Plavix 75mg  daily for anti-platelet monotherapy - Continue metoprolol tartrate 12.5 mg twice daily (reduced last year per nurse documentation).  - Continue amlodipine 7.5 mg daily - Follow-up in 6 months  Hypertension Blood pressure well-controlled on current regimen. Current medications include amlodipine 7.5 mg daily and metoprolol tartrate 12.5 mg twice daily. Blood pressure today was 130/82, consistent with home readings. - Continue current antihypertensive regimen - Monitor blood pressure at home  Hyperlipidemia LDL cholesterol well-controlled with atorvastatin 80 mg daily. Last LDL was 68 in May, meeting the goal of less than 70. - Continue atorvastatin 80 mg daily  PVC No recent symptoms. ECG with stable 1st degree AVB. -Continue metoprolol tartrate 12.5mg  BID.   General Health Maintenance Routine health maintenance discussed. Advised on managing amlodipine-related swelling with compression socks and leg elevation. - Encourage use of compression socks and leg elevation for amlodipine-related swelling - Continue regular physical activity as tolerated  Follow-up - Follow-up in 6 months.        Informed Consent   Shared Decision Making/Informed Consent The risks [chest pain, shortness of breath, cardiac arrhythmias, dizziness, blood pressure fluctuations,  myocardial infarction, stroke/transient ischemic attack, nausea, vomiting, allergic reaction, radiation exposure, metallic taste sensation and life-threatening complications (estimated to be 1 in 10,000)], benefits (risk stratification, diagnosing coronary artery disease, treatment guidance) and alternatives of a nuclear stress test were discussed in detail with Jacqueline Burke and she agrees to proceed.      Signed, Perlie Gold, PA-C

## 2023-04-13 NOTE — Patient Instructions (Signed)
Medication Instructions:  Your physician recommends that you continue on your current medications as directed. Please refer to the Current Medication list given to you today.  Refill for nitroglycerin sent to your pharmacy.  *If you need a refill on your cardiac medications before your next appointment, please call your pharmacy*  Lab Work: None ordered today. If you have labs (blood work) drawn today and your tests are completely normal, you will receive your results only by: MyChart Message (if you have MyChart) OR A paper copy in the mail If you have any lab test that is abnormal or we need to change your treatment, we will call you to review the results.  Testing/Procedures: Your physician has requested that you have a lexiscan myoview. For further information please visit [email or web address] Please follow instruction sheet, as given.   Follow-Up: At [facility], you and your health needs are our priority.  As part of our continuing mission to provide you with exceptional heart care, we have created designated Provider Care Teams.  These Care Teams include your primary Cardiologist (physician) and Advanced Practice Providers (APPs -  Physician Assistants and Nurse Practitioners) who all work together to provide you with the care you need, when you need it.  Your next appointment:   6 month(s)  The format for your next appointment:   In Person  Provider:   Armanda Magic, MD or Perlie Gold, PA  Other Instructions Steffanie Dunn (Stress Test) Instructions  Please arrive 15 minutes prior to your appointment time for registration and insurance purposes.   The test will take approximately 3 to 4 hours to complete; you may bring reading material.  If someone comes with you to your appointment, they will need to remain in the main lobby due to limited space in the testing area. **If you are pregnant or breastfeeding, please notify the nuclear lab prior to your appointment**    How to prepare for your Myocardial Perfusion Test: Do not eat or drink 3 hours prior to your test, except you may have water. Do not consume products containing caffeine (regular or decaffeinated) 12 hours prior to your test. (ex: coffee, chocolate, sodas, tea). Do bring a list of your current medications with you.  If not listed below, you may take your medications as normal. Do wear comfortable clothes (no dresses or overalls) and walking shoes, tennis shoes preferred (No heels or open toe shoes are allowed). Do NOT wear cologne, perfume, aftershave, or lotions (deodorant is allowed). If these instructions are not followed, your test will have to be rescheduled.   Please report to [address], [address] for your test.  If you have questions or concerns about your appointment, you can call the Nuclear Lab at [phone].   If you cannot keep your appointment, please provide 24 hours notification to the Nuclear Lab, to avoid a possible $50 charge to your account.

## 2023-04-17 ENCOUNTER — Ambulatory Visit (HOSPITAL_COMMUNITY): Payer: No Typology Code available for payment source | Attending: Cardiology

## 2023-04-17 ENCOUNTER — Encounter (HOSPITAL_COMMUNITY): Payer: Self-pay | Admitting: *Deleted

## 2023-04-17 DIAGNOSIS — I2510 Atherosclerotic heart disease of native coronary artery without angina pectoris: Secondary | ICD-10-CM | POA: Diagnosis not present

## 2023-04-17 MED ORDER — TECHNETIUM TC 99M TETROFOSMIN IV KIT
9.4000 | PACK | Freq: Once | INTRAVENOUS | Status: AC | PRN
  Administered 2023-04-17: 9.4 via INTRAVENOUS

## 2023-04-20 ENCOUNTER — Ambulatory Visit (HOSPITAL_COMMUNITY): Payer: No Typology Code available for payment source | Attending: Cardiology

## 2023-04-20 DIAGNOSIS — I2510 Atherosclerotic heart disease of native coronary artery without angina pectoris: Secondary | ICD-10-CM | POA: Insufficient documentation

## 2023-04-20 LAB — MYOCARDIAL PERFUSION IMAGING
LV dias vol: 54 mL (ref 46–106)
LV sys vol: 16 mL
Nuc Stress EF: 70 %
Peak HR: 100 {beats}/min
Rest HR: 68 {beats}/min
Rest Nuclear Isotope Dose: 9.4 mCi
SDS: 0
SRS: 0
SSS: 0
ST Depression (mm): 0 mm
Stress Nuclear Isotope Dose: 30.7 mCi
TID: 0.98

## 2023-04-20 MED ORDER — REGADENOSON 0.4 MG/5ML IV SOLN
0.4000 mg | Freq: Once | INTRAVENOUS | Status: AC
  Administered 2023-04-20: 0.4 mg via INTRAVENOUS

## 2023-04-20 MED ORDER — TECHNETIUM TC 99M TETROFOSMIN IV KIT
30.7000 | PACK | Freq: Once | INTRAVENOUS | Status: AC | PRN
  Administered 2023-04-20: 30.7 via INTRAVENOUS

## 2023-05-07 DIAGNOSIS — M810 Age-related osteoporosis without current pathological fracture: Secondary | ICD-10-CM | POA: Diagnosis not present

## 2023-06-11 DIAGNOSIS — Z961 Presence of intraocular lens: Secondary | ICD-10-CM | POA: Diagnosis not present

## 2023-06-11 DIAGNOSIS — H26491 Other secondary cataract, right eye: Secondary | ICD-10-CM | POA: Diagnosis not present

## 2023-06-11 DIAGNOSIS — H02831 Dermatochalasis of right upper eyelid: Secondary | ICD-10-CM | POA: Diagnosis not present

## 2023-06-11 DIAGNOSIS — H26493 Other secondary cataract, bilateral: Secondary | ICD-10-CM | POA: Diagnosis not present

## 2023-06-11 DIAGNOSIS — H18413 Arcus senilis, bilateral: Secondary | ICD-10-CM | POA: Diagnosis not present

## 2023-06-28 ENCOUNTER — Other Ambulatory Visit: Payer: Self-pay | Admitting: Cardiology

## 2023-06-28 DIAGNOSIS — I10 Essential (primary) hypertension: Secondary | ICD-10-CM

## 2023-06-29 DIAGNOSIS — M810 Age-related osteoporosis without current pathological fracture: Secondary | ICD-10-CM | POA: Diagnosis not present

## 2023-06-29 DIAGNOSIS — E785 Hyperlipidemia, unspecified: Secondary | ICD-10-CM | POA: Diagnosis not present

## 2023-06-29 DIAGNOSIS — R7303 Prediabetes: Secondary | ICD-10-CM | POA: Diagnosis not present

## 2023-06-29 DIAGNOSIS — I129 Hypertensive chronic kidney disease with stage 1 through stage 4 chronic kidney disease, or unspecified chronic kidney disease: Secondary | ICD-10-CM | POA: Diagnosis not present

## 2023-06-29 DIAGNOSIS — N1831 Chronic kidney disease, stage 3a: Secondary | ICD-10-CM | POA: Diagnosis not present

## 2023-06-29 DIAGNOSIS — I2510 Atherosclerotic heart disease of native coronary artery without angina pectoris: Secondary | ICD-10-CM | POA: Diagnosis not present

## 2023-06-29 DIAGNOSIS — Z008 Encounter for other general examination: Secondary | ICD-10-CM | POA: Diagnosis not present

## 2023-06-29 DIAGNOSIS — Z6825 Body mass index (BMI) 25.0-25.9, adult: Secondary | ICD-10-CM | POA: Diagnosis not present

## 2023-06-29 DIAGNOSIS — E663 Overweight: Secondary | ICD-10-CM | POA: Diagnosis not present

## 2023-06-29 DIAGNOSIS — R2681 Unsteadiness on feet: Secondary | ICD-10-CM | POA: Diagnosis not present

## 2023-06-29 DIAGNOSIS — J449 Chronic obstructive pulmonary disease, unspecified: Secondary | ICD-10-CM | POA: Diagnosis not present

## 2023-06-30 DIAGNOSIS — H26492 Other secondary cataract, left eye: Secondary | ICD-10-CM | POA: Diagnosis not present

## 2023-06-30 MED ORDER — AMLODIPINE BESYLATE 5 MG PO TABS: ORAL_TABLET | ORAL | 3 refills | Status: DC

## 2023-07-09 DIAGNOSIS — M810 Age-related osteoporosis without current pathological fracture: Secondary | ICD-10-CM | POA: Diagnosis not present

## 2023-07-09 DIAGNOSIS — E559 Vitamin D deficiency, unspecified: Secondary | ICD-10-CM | POA: Diagnosis not present

## 2023-07-09 DIAGNOSIS — R5383 Other fatigue: Secondary | ICD-10-CM | POA: Diagnosis not present

## 2023-07-29 DIAGNOSIS — R413 Other amnesia: Secondary | ICD-10-CM | POA: Diagnosis not present

## 2023-07-29 DIAGNOSIS — M810 Age-related osteoporosis without current pathological fracture: Secondary | ICD-10-CM | POA: Diagnosis not present

## 2023-07-29 DIAGNOSIS — N3946 Mixed incontinence: Secondary | ICD-10-CM | POA: Diagnosis not present

## 2023-07-29 DIAGNOSIS — I10 Essential (primary) hypertension: Secondary | ICD-10-CM | POA: Diagnosis not present

## 2023-07-29 DIAGNOSIS — E7800 Pure hypercholesterolemia, unspecified: Secondary | ICD-10-CM | POA: Diagnosis not present

## 2023-08-07 DIAGNOSIS — R944 Abnormal results of kidney function studies: Secondary | ICD-10-CM | POA: Diagnosis not present

## 2023-09-01 ENCOUNTER — Other Ambulatory Visit: Payer: Self-pay | Admitting: Cardiology

## 2023-09-01 DIAGNOSIS — I2510 Atherosclerotic heart disease of native coronary artery without angina pectoris: Secondary | ICD-10-CM

## 2023-09-07 ENCOUNTER — Other Ambulatory Visit: Payer: Self-pay | Admitting: Cardiology

## 2023-09-07 DIAGNOSIS — I2510 Atherosclerotic heart disease of native coronary artery without angina pectoris: Secondary | ICD-10-CM

## 2023-09-08 DIAGNOSIS — M810 Age-related osteoporosis without current pathological fracture: Secondary | ICD-10-CM | POA: Diagnosis not present

## 2023-09-08 DIAGNOSIS — Z6824 Body mass index (BMI) 24.0-24.9, adult: Secondary | ICD-10-CM | POA: Diagnosis not present

## 2023-09-08 DIAGNOSIS — M154 Erosive (osteo)arthritis: Secondary | ICD-10-CM | POA: Diagnosis not present

## 2023-09-09 DIAGNOSIS — R5383 Other fatigue: Secondary | ICD-10-CM | POA: Diagnosis not present

## 2023-09-09 DIAGNOSIS — M810 Age-related osteoporosis without current pathological fracture: Secondary | ICD-10-CM | POA: Diagnosis not present

## 2023-09-14 ENCOUNTER — Other Ambulatory Visit: Payer: Self-pay

## 2023-09-14 ENCOUNTER — Observation Stay (HOSPITAL_COMMUNITY)

## 2023-09-14 ENCOUNTER — Encounter (HOSPITAL_COMMUNITY): Payer: Self-pay

## 2023-09-14 ENCOUNTER — Inpatient Hospital Stay (HOSPITAL_COMMUNITY)
Admission: EM | Admit: 2023-09-14 | Discharge: 2023-09-18 | DRG: 034 | Disposition: A | Discharge status: Home / Self Care | Source: Ambulatory Visit | Attending: Internal Medicine | Admitting: Internal Medicine

## 2023-09-14 DIAGNOSIS — I119 Hypertensive heart disease without heart failure: Secondary | ICD-10-CM | POA: Diagnosis not present

## 2023-09-14 DIAGNOSIS — H3410 Central retinal artery occlusion, unspecified eye: Secondary | ICD-10-CM | POA: Diagnosis not present

## 2023-09-14 DIAGNOSIS — I639 Cerebral infarction, unspecified: Secondary | ICD-10-CM

## 2023-09-14 DIAGNOSIS — Z955 Presence of coronary angioplasty implant and graft: Secondary | ICD-10-CM

## 2023-09-14 DIAGNOSIS — K219 Gastro-esophageal reflux disease without esophagitis: Secondary | ICD-10-CM | POA: Diagnosis present

## 2023-09-14 DIAGNOSIS — H3411 Central retinal artery occlusion, right eye: Principal | ICD-10-CM | POA: Diagnosis present

## 2023-09-14 DIAGNOSIS — Z87442 Personal history of urinary calculi: Secondary | ICD-10-CM

## 2023-09-14 DIAGNOSIS — I252 Old myocardial infarction: Secondary | ICD-10-CM

## 2023-09-14 DIAGNOSIS — N1831 Chronic kidney disease, stage 3a: Secondary | ICD-10-CM | POA: Diagnosis present

## 2023-09-14 DIAGNOSIS — Z7982 Long term (current) use of aspirin: Secondary | ICD-10-CM

## 2023-09-14 DIAGNOSIS — I6529 Occlusion and stenosis of unspecified carotid artery: Secondary | ICD-10-CM | POA: Insufficient documentation

## 2023-09-14 DIAGNOSIS — Z8262 Family history of osteoporosis: Secondary | ICD-10-CM

## 2023-09-14 DIAGNOSIS — N179 Acute kidney failure, unspecified: Secondary | ICD-10-CM | POA: Diagnosis not present

## 2023-09-14 DIAGNOSIS — M810 Age-related osteoporosis without current pathological fracture: Secondary | ICD-10-CM | POA: Diagnosis present

## 2023-09-14 DIAGNOSIS — I6782 Cerebral ischemia: Secondary | ICD-10-CM | POA: Diagnosis not present

## 2023-09-14 DIAGNOSIS — Z87891 Personal history of nicotine dependence: Secondary | ICD-10-CM

## 2023-09-14 DIAGNOSIS — Z8249 Family history of ischemic heart disease and other diseases of the circulatory system: Secondary | ICD-10-CM

## 2023-09-14 DIAGNOSIS — H547 Unspecified visual loss: Secondary | ICD-10-CM | POA: Diagnosis not present

## 2023-09-14 DIAGNOSIS — H5340 Unspecified visual field defects: Secondary | ICD-10-CM | POA: Diagnosis present

## 2023-09-14 DIAGNOSIS — H5461 Unqualified visual loss, right eye, normal vision left eye: Secondary | ICD-10-CM | POA: Diagnosis present

## 2023-09-14 DIAGNOSIS — Z823 Family history of stroke: Secondary | ICD-10-CM

## 2023-09-14 DIAGNOSIS — I10 Essential (primary) hypertension: Secondary | ICD-10-CM | POA: Diagnosis present

## 2023-09-14 DIAGNOSIS — Z882 Allergy status to sulfonamides status: Secondary | ICD-10-CM

## 2023-09-14 DIAGNOSIS — I2510 Atherosclerotic heart disease of native coronary artery without angina pectoris: Secondary | ICD-10-CM | POA: Diagnosis not present

## 2023-09-14 DIAGNOSIS — I1310 Hypertensive heart and chronic kidney disease without heart failure, with stage 1 through stage 4 chronic kidney disease, or unspecified chronic kidney disease: Secondary | ICD-10-CM | POA: Diagnosis present

## 2023-09-14 DIAGNOSIS — I6340 Cerebral infarction due to embolism of unspecified cerebral artery: Secondary | ICD-10-CM | POA: Diagnosis not present

## 2023-09-14 DIAGNOSIS — E860 Dehydration: Secondary | ICD-10-CM | POA: Diagnosis present

## 2023-09-14 DIAGNOSIS — Z833 Family history of diabetes mellitus: Secondary | ICD-10-CM

## 2023-09-14 DIAGNOSIS — J449 Chronic obstructive pulmonary disease, unspecified: Secondary | ICD-10-CM | POA: Diagnosis present

## 2023-09-14 DIAGNOSIS — Z96653 Presence of artificial knee joint, bilateral: Secondary | ICD-10-CM | POA: Diagnosis present

## 2023-09-14 DIAGNOSIS — E875 Hyperkalemia: Secondary | ICD-10-CM | POA: Diagnosis present

## 2023-09-14 DIAGNOSIS — I358 Other nonrheumatic aortic valve disorders: Secondary | ICD-10-CM | POA: Diagnosis present

## 2023-09-14 DIAGNOSIS — Z885 Allergy status to narcotic agent status: Secondary | ICD-10-CM

## 2023-09-14 DIAGNOSIS — E785 Hyperlipidemia, unspecified: Secondary | ICD-10-CM | POA: Diagnosis present

## 2023-09-14 DIAGNOSIS — M069 Rheumatoid arthritis, unspecified: Secondary | ICD-10-CM | POA: Diagnosis present

## 2023-09-14 DIAGNOSIS — I63131 Cerebral infarction due to embolism of right carotid artery: Secondary | ICD-10-CM | POA: Diagnosis present

## 2023-09-14 DIAGNOSIS — Z803 Family history of malignant neoplasm of breast: Secondary | ICD-10-CM

## 2023-09-14 DIAGNOSIS — G90A Postural orthostatic tachycardia syndrome (POTS): Secondary | ICD-10-CM | POA: Diagnosis present

## 2023-09-14 DIAGNOSIS — Z7902 Long term (current) use of antithrombotics/antiplatelets: Secondary | ICD-10-CM

## 2023-09-14 DIAGNOSIS — Z79899 Other long term (current) drug therapy: Secondary | ICD-10-CM

## 2023-09-14 DIAGNOSIS — M159 Polyosteoarthritis, unspecified: Secondary | ICD-10-CM | POA: Diagnosis present

## 2023-09-14 LAB — BASIC METABOLIC PANEL WITH GFR
Anion gap: 10 (ref 5–15)
BUN: 19 mg/dL (ref 8–23)
CO2: 25 mmol/L (ref 22–32)
Calcium: 10.3 mg/dL (ref 8.9–10.3)
Chloride: 107 mmol/L (ref 98–111)
Creatinine, Ser: 1.12 mg/dL — ABNORMAL HIGH (ref 0.44–1.00)
GFR, Estimated: 48 mL/min — ABNORMAL LOW (ref >60)
Glucose, Bld: 105 mg/dL — ABNORMAL HIGH (ref 70–99)
Potassium: 4.5 mmol/L (ref 3.5–5.1)
Sodium: 142 mmol/L (ref 135–145)

## 2023-09-14 LAB — COMPREHENSIVE METABOLIC PANEL WITH GFR
ALT: 15 U/L (ref 0–44)
AST: 20 U/L (ref 15–41)
Albumin: 4 g/dL (ref 3.5–5.0)
Alkaline Phosphatase: 38 U/L (ref 38–126)
Anion gap: 10 (ref 5–15)
BUN: 18 mg/dL (ref 8–23)
CO2: 26 mmol/L (ref 22–32)
Calcium: 10.2 mg/dL (ref 8.9–10.3)
Chloride: 104 mmol/L (ref 98–111)
Creatinine, Ser: 1.23 mg/dL — ABNORMAL HIGH (ref 0.44–1.00)
GFR, Estimated: 43 mL/min — ABNORMAL LOW (ref >60)
Glucose, Bld: 105 mg/dL — ABNORMAL HIGH (ref 70–99)
Potassium: 4.9 mmol/L (ref 3.5–5.1)
Sodium: 140 mmol/L (ref 135–145)
Total Bilirubin: 0.5 mg/dL (ref 0.0–1.2)
Total Protein: 7.2 g/dL (ref 6.5–8.1)

## 2023-09-14 LAB — I-STAT CHEM 8, ED
BUN: 25 mg/dL — ABNORMAL HIGH (ref 8–23)
Calcium, Ion: 1.24 mmol/L (ref 1.15–1.40)
Chloride: 106 mmol/L (ref 98–111)
Creatinine, Ser: 1.3 mg/dL — ABNORMAL HIGH (ref 0.44–1.00)
Glucose, Bld: 102 mg/dL — ABNORMAL HIGH (ref 70–99)
HCT: 36 % (ref 36.0–46.0)
Hemoglobin: 12.2 g/dL (ref 12.0–15.0)
Potassium: 5.2 mmol/L — ABNORMAL HIGH (ref 3.5–5.1)
Sodium: 140 mmol/L (ref 135–145)
TCO2: 28 mmol/L (ref 22–32)

## 2023-09-14 LAB — RAPID URINE DRUG SCREEN, HOSP PERFORMED
Amphetamines: NOT DETECTED
Barbiturates: NOT DETECTED
Benzodiazepines: NOT DETECTED
Cocaine: NOT DETECTED
Opiates: NOT DETECTED
Tetrahydrocannabinol: NOT DETECTED

## 2023-09-14 LAB — URINALYSIS, ROUTINE W REFLEX MICROSCOPIC
Bilirubin Urine: NEGATIVE
Glucose, UA: NEGATIVE mg/dL
Hgb urine dipstick: NEGATIVE
Ketones, ur: NEGATIVE mg/dL
Nitrite: NEGATIVE
Protein, ur: NEGATIVE mg/dL
Specific Gravity, Urine: 1.006 (ref 1.005–1.030)
pH: 5 (ref 5.0–8.0)

## 2023-09-14 LAB — CBC
HCT: 35.8 % — ABNORMAL LOW (ref 36.0–46.0)
Hemoglobin: 11.7 g/dL — ABNORMAL LOW (ref 12.0–15.0)
MCH: 29.9 pg (ref 26.0–34.0)
MCHC: 32.7 g/dL (ref 30.0–36.0)
MCV: 91.6 fL (ref 80.0–100.0)
Platelets: 279 10*3/uL (ref 150–400)
RBC: 3.91 MIL/uL (ref 3.87–5.11)
RDW: 15.4 % (ref 11.5–15.5)
WBC: 8.9 10*3/uL (ref 4.0–10.5)
nRBC: 0 % (ref 0.0–0.2)

## 2023-09-14 LAB — DIFFERENTIAL
Abs Immature Granulocytes: 0.02 10*3/uL (ref 0.00–0.07)
Basophils Absolute: 0.1 10*3/uL (ref 0.0–0.1)
Basophils Relative: 1 %
Eosinophils Absolute: 0.2 10*3/uL (ref 0.0–0.5)
Eosinophils Relative: 3 %
Immature Granulocytes: 0 %
Lymphocytes Relative: 23 %
Lymphs Abs: 2 10*3/uL (ref 0.7–4.0)
Monocytes Absolute: 0.7 10*3/uL (ref 0.1–1.0)
Monocytes Relative: 8 %
Neutro Abs: 5.8 10*3/uL (ref 1.7–7.7)
Neutrophils Relative %: 65 %

## 2023-09-14 LAB — APTT: aPTT: 33 s (ref 24–36)

## 2023-09-14 LAB — CBG MONITORING, ED: Glucose-Capillary: 92 mg/dL (ref 70–99)

## 2023-09-14 LAB — ETHANOL: Alcohol, Ethyl (B): <10 mg/dL (ref <10)

## 2023-09-14 LAB — C-REACTIVE PROTEIN: CRP: 0.6 mg/dL (ref <1.0)

## 2023-09-14 LAB — SEDIMENTATION RATE: Sed Rate: 13 mm/h (ref 0–22)

## 2023-09-14 LAB — PROTIME-INR
INR: 1 (ref 0.8–1.2)
Prothrombin Time: 13.4 s (ref 11.4–15.2)

## 2023-09-14 MED ORDER — METOPROLOL TARTRATE 12.5 MG HALF TABLET
12.5000 mg | ORAL_TABLET | Freq: Two times a day (BID) | ORAL | Status: DC
  Administered 2023-09-14 – 2023-09-18 (×8): 12.5 mg via ORAL
  Filled 2023-09-14 (×8): qty 1

## 2023-09-14 MED ORDER — ENOXAPARIN SODIUM 30 MG/0.3ML IJ SOSY
30.0000 mg | PREFILLED_SYRINGE | INTRAMUSCULAR | Status: DC
  Administered 2023-09-14 – 2023-09-17 (×4): 30 mg via SUBCUTANEOUS
  Filled 2023-09-14 (×4): qty 0.3

## 2023-09-14 MED ORDER — TROSPIUM CHLORIDE ER 60 MG PO CP24
60.0000 mg | ORAL_CAPSULE | Freq: Every day | ORAL | Status: DC
Start: 2023-09-14 — End: 2023-09-14

## 2023-09-14 MED ORDER — AMLODIPINE BESYLATE 5 MG PO TABS
7.5000 mg | ORAL_TABLET | Freq: Every day | ORAL | Status: DC
  Filled 2023-09-14: qty 2

## 2023-09-14 MED ORDER — ATORVASTATIN CALCIUM 80 MG PO TABS
80.0000 mg | ORAL_TABLET | Freq: Every evening | ORAL | Status: DC
Start: 2023-09-14 — End: 2023-09-18
  Administered 2023-09-14 – 2023-09-17 (×4): 80 mg via ORAL
  Filled 2023-09-14: qty 1
  Filled 2023-09-14: qty 2
  Filled 2023-09-14 (×2): qty 1

## 2023-09-14 MED ORDER — TIOTROPIUM BROMIDE MONOHYDRATE 1.25 MCG/ACT IN AERS: 2.0000 | INHALATION_SPRAY | Freq: Every day | RESPIRATORY_TRACT | Status: DC | PRN

## 2023-09-14 MED ORDER — GABAPENTIN 300 MG PO CAPS: 300.0000 mg | ORAL_CAPSULE | Freq: Every day | ORAL | Status: DC | PRN

## 2023-09-14 MED ORDER — FLUTICASONE PROPIONATE 50 MCG/ACT NA SUSP: 2.0000 | Freq: Every day | NASAL | Status: DC | PRN

## 2023-09-14 MED ORDER — ACETAMINOPHEN 650 MG RE SUPP: 650.0000 mg | RECTAL | Status: DC | PRN

## 2023-09-14 MED ORDER — VITAMIN B-12 1000 MCG PO TABS
5000.0000 ug | ORAL_TABLET | Freq: Every day | ORAL | Status: DC
  Administered 2023-09-15 – 2023-09-18 (×4): 5000 ug via ORAL
  Filled 2023-09-14 (×4): qty 5

## 2023-09-14 MED ORDER — SENNOSIDES-DOCUSATE SODIUM 8.6-50 MG PO TABS: 1.0000 | ORAL_TABLET | Freq: Every evening | ORAL | Status: DC | PRN

## 2023-09-14 MED ORDER — ALBUTEROL SULFATE 108 (90 BASE) MCG/ACT IN AEPB
1.0000 | INHALATION_SPRAY | Freq: Every day | RESPIRATORY_TRACT | Status: DC | PRN
Start: 2023-09-14 — End: 2023-09-14

## 2023-09-14 MED ORDER — CLOPIDOGREL BISULFATE 75 MG PO TABS
75.0000 mg | ORAL_TABLET | Freq: Every day | ORAL | Status: DC
  Administered 2023-09-15 – 2023-09-17 (×3): 75 mg via ORAL
  Filled 2023-09-14 (×3): qty 1

## 2023-09-14 MED ORDER — SODIUM CHLORIDE 0.9 % IV SOLN: INTRAVENOUS | Status: AC

## 2023-09-14 MED ORDER — VITAMIN D 25 MCG (1000 UNIT) PO TABS
3000.0000 [IU] | ORAL_TABLET | Freq: Every day | ORAL | Status: DC
  Administered 2023-09-15 – 2023-09-18 (×4): 3000 [IU] via ORAL
  Filled 2023-09-14 (×4): qty 3

## 2023-09-14 MED ORDER — ALBUTEROL SULFATE (2.5 MG/3ML) 0.083% IN NEBU: 2.5000 mg | INHALATION_SOLUTION | Freq: Every day | RESPIRATORY_TRACT | Status: DC | PRN

## 2023-09-14 MED ORDER — ASPIRIN 81 MG PO TBEC
81.0000 mg | DELAYED_RELEASE_TABLET | Freq: Every day | ORAL | Status: DC
  Administered 2023-09-15 – 2023-09-18 (×4): 81 mg via ORAL
  Filled 2023-09-14 (×4): qty 1

## 2023-09-14 MED ORDER — ACETAMINOPHEN 160 MG/5ML PO SOLN: 650.0000 mg | ORAL | Status: DC | PRN

## 2023-09-14 MED ORDER — NITROGLYCERIN 0.4 MG SL SUBL: 0.4000 mg | SUBLINGUAL_TABLET | SUBLINGUAL | Status: DC | PRN

## 2023-09-14 MED ORDER — STROKE: EARLY STAGES OF RECOVERY BOOK
Freq: Once | Status: AC
  Filled 2023-09-14: qty 1

## 2023-09-14 MED ORDER — ACETAMINOPHEN 325 MG PO TABS: 650.0000 mg | ORAL_TABLET | ORAL | Status: DC | PRN

## 2023-09-14 MED ORDER — AMLODIPINE BESYLATE 5 MG PO TABS: 2.5000 mg | ORAL_TABLET | Freq: Every day | ORAL | Status: DC

## 2023-09-14 MED ORDER — ACETAMINOPHEN 325 MG PO TABS
325.0000 mg | ORAL_TABLET | Freq: Every day | ORAL | Status: DC
  Administered 2023-09-14 – 2023-09-17 (×4): 325 mg via ORAL
  Filled 2023-09-14 (×4): qty 1

## 2023-09-14 MED ORDER — ONDANSETRON HCL 4 MG/2ML IJ SOLN: 4.0000 mg | Freq: Four times a day (QID) | INTRAMUSCULAR | Status: DC | PRN

## 2023-09-14 MED ORDER — LORATADINE 10 MG PO TABS
10.0000 mg | ORAL_TABLET | Freq: Every day | ORAL | Status: DC
  Administered 2023-09-14 – 2023-09-18 (×5): 10 mg via ORAL
  Filled 2023-09-14 (×5): qty 1

## 2023-09-14 NOTE — ED Notes (Signed)
 Assumed care on patient , patient currently at MRI .

## 2023-09-14 NOTE — ED Triage Notes (Signed)
 Patient was sent by ophthalmology after on Sunday having vision loss in right eye around noon.  Patient has occlusion on imaging the ophthalmology did today.  Patient reports still has not vision in right eye and it is completely black.  Denies any other stroke symptoms.

## 2023-09-14 NOTE — ED Provider Triage Note (Signed)
 Emergency Medicine Provider Triage Evaluation Note  Jacqueline Burke , a 88 y.o. female  was evaluated in triage.  Pt complains of painless loss of vision to R eye noticed around 12 noon yesterday. States she took her shirt off after church and lost vision to R eye. No pain. No neck pain, headache, one sided weakness or numbness, lightheadedness, dizziness. No pain. No neuro deficits. Saw eye doctor today who did imaging study confirming retinal artery occlusion.   Review of Systems  Positive:  Negative:   Physical Exam  BP (!) 138/94 (BP Location: Right Arm)   Pulse 82   Temp 98.3 F (36.8 C) (Oral)   Resp 14   Ht 5\' 1"  (1.549 m)   Wt 57.2 kg   SpO2 98%   BMI 23.81 kg/m  Gen:   Awake, no distress   Resp:  Normal effort  MSK:   Moves extremities without difficulty  Other:  No neuro deficits  Medical Decision Making  Medically screening exam initiated at 3:24 PM.  Appropriate orders placed.  Corlette S Flow was informed that the remainder of the evaluation will be completed by another provider, this initial triage assessment does not replace that evaluation, and the importance of remaining in the ED until their evaluation is complete.    Adel Aden, PA-C 09/14/23 1525

## 2023-09-14 NOTE — H&P (Signed)
 History and Physical    Jacqueline Burke:[id number] DOB: 11/18/1935 DOA: 09/14/2023  PCP: Shon Hale, MD  Patient coming from: Home  I have personally briefly reviewed patient's old medical records in [facility]  Chief Complaint: Vision loss right eye  HPI: Jacqueline Burke is a 88 y.o. female with medical history significant of CAD, hypertension, hyperlipidemia, rheumatoid arthritis presenting to the ED with sudden onset right visual loss. Patient noted to have returned from church 1 day prior to admission when she was putting her sweater over her head she acutely noted she could not see out of her right eye and describes it as just noted to be black.  Patient denied any pain, no warning signs, and no vision in that eye since then.  Patient with some intermittent flashing lights per ED physician.  Patient saw ophthalmologist on day of admission who assessed her and confirm she had a central retinal artery occlusion of the right eye, told patient to go home and take 4 baby aspirin's and subsequently presented to the ED.  Patient sent to the ED for neurological evaluation and stroke workup.  Patient denies any aphasia, no facial weakness, no asymmetric weakness or numbness, no fever, no chills, no nausea, no vomiting, no chest pain, no shortness of breath, no abdominal pain, no diarrhea, no constipation, no melena, no hematemesis, no hematochezia, no lightheadedness, no dizziness, no syncopal episodes.  ED Course: Patient seen in the ED initial comprehensive metabolic profile with a glucose of 105, creatinine of 1.23 otherwise within normal limits.  Repeat i-STAT 8 with a potassium of 5.2, glucose of 102, BUN of 25 creatinine of 1.30 otherwise within normal limits.  CBC done with a white count of 8.9 hemoglobin of 11.7 platelet count of 279.  INR 1.0, PT of 13.4, PTT of 33.  Urinalysis done with small leukocytes, nitrite negative, rare bacteria, 0-5 WBCs.  ED physician spoke with  neurologist on-call who recommended hospitalist admission, MRI/MRA of the head and patient will be seen in formal consultation by neurology.  Review of Systems: As per HPI otherwise all other systems reviewed and are negative.  Past Medical History:  Diagnosis Date   Allergic rhinitis    CAD (coronary artery disease), native coronary artery    NSTEMI with 95% RCA s/p DES to RCA 06/2017   Complication of anesthesia    makes her confused cursing etc..   Diverticulosis    GERD (gastroesophageal reflux disease)    Heart murmur    Systolic heart murmur with Aortic valve sclerosis by ECHO   History of kidney stones    Hyperlipidemia    Hypertension    Myocardial infarction (HCC)    Osteoarthritis    Erosive OA-MRI of R hand negative for synovitis, only OA-Dr Nickola Major   Osteopenia    Pelvic prolapse    PVC's (premature ventricular contractions)     Past Surgical History:  Procedure Laterality Date   BUNIONECTOMY  08/2006   R foot and hammer roe right second toe   CARDIAC CATHETERIZATION     CARPAL TUNNEL RELEASE  2002   bil hands   CONVERSION TO TOTAL HIP Left 08/08/2022   Procedure: CONVERSION TO TOTAL HIP FROM IM NAIL;  Surgeon: Joen Laura, MD;  Location: [facility];  Service: Orthopedics;  Laterality: Left;   CORONARY STENT INTERVENTION N/A 06/15/2017   Procedure: CORONARY STENT INTERVENTION;  Surgeon: Runell Gess, MD;  Location: [facility];  Service: Cardiovascular;  Laterality: N/A;   CYSTOSCOPY     with laser lithotripsy Dr. Alvester Morin 07-20-17    CYSTOSCOPY W/ URETERAL STENT PLACEMENT Left 06/09/2017   Procedure: CYSTOSCOPY WITH RETROGRADE PYELOGRAM/URETERAL STENT PLACEMENT;  Surgeon: Crista Elliot, MD;  Location: [facility];  Service: Urology;  Laterality: Left;   CYSTOSCOPY WITH RETROGRADE PYELOGRAM, URETEROSCOPY AND STENT PLACEMENT Left 07/20/2017   Procedure: CYSTOSCOPY WITH RETROGRADE PYELOGRAM, LEFT URETEROSCOPY HOLMIUM LASER LITHO  AND STENT EXCHANGE;  Surgeon:  Crista Elliot, MD;  Location: [facility];  Service: Urology;  Laterality: Left;   HOLMIUM LASER APPLICATION Left 07/20/2017   Procedure: HOLMIUM LASER APPLICATION;  Surgeon: Crista Elliot, MD;  Location: [facility];  Service: Urology;  Laterality: Left;   INTRAMEDULLARY (IM) NAIL INTERTROCHANTERIC Left 08/04/2020   Procedure: INTRAMEDULLARY (IM) NAIL INTERTROCHANTRIC WITH CABLES;  Surgeon: Cammy Copa, MD;  Location: [facility];  Service: Orthopedics;  Laterality: Left;   LEFT HEART CATH AND CORONARY ANGIOGRAPHY N/A 06/15/2017   Procedure: LEFT HEART CATH AND CORONARY ANGIOGRAPHY;  Surgeon: Runell Gess, MD;  Location: [facility];  Service: Cardiovascular;  Laterality: N/A;   OPEN REDUCTION INTERNAL FIXATION (ORIF) DISTAL RADIAL FRACTURE Left 10/23/2022   Procedure: OPEN REDUCTION INTERNAL FIXATION (ORIF) DISTAL RADIUS FRACTURE;  Surgeon: Teryl Lucy, MD;  Location: [facility];  Service: Orthopedics;  Laterality: Left;   TOTAL KNEE ARTHROPLASTY Left 08/2007   TOTAL KNEE ARTHROPLASTY Right 03/2010    Social History  reports that she quit smoking about 30 years ago. Her smoking use included cigarettes. She started smoking about 55 years ago. She has a 25 pack-year smoking history. She has never used smokeless tobacco. She reports that she does not drink alcohol and does not use drugs.  Allergies  Allergen Reactions   Ciprofloxacin Hives   Codeine Hives   Demerol [Meperidine] Hives   Hydrogen Peroxide Itching and Other (See Comments)    White blisters   Propoxyphene Other (See Comments)    Unknown Other reaction(s): OTHER   Tramadol Other (See Comments)    "ITCHY ON THE INSIDE"    Family History  Problem Relation Age of Onset   Stroke Mother    Diabetes Mother    Heart attack Mother    Osteoporosis Father    Breast cancer Cousin    Mother deceased in her 59s from a stroke per patient, father deceased age 28 from a heart attack.  Prior to Admission medications    Medication Sig Start Date End Date Taking? Authorizing Provider  acetaminophen (TYLENOL) 325 MG tablet Take 325 mg by mouth at bedtime.    [provider]  Albuterol Sulfate (PROAIR RESPICLICK) 108 (90 Base) MCG/ACT AEPB Inhale 1 puff into the lungs daily as needed (wheezing). 08/29/20   Medina-Vargas, Monina C, NP  amLODipine (NORVASC) 2.5 MG tablet TAKE 1 TABLET BY MOUTH ONCE DAILY WITH  5  MG  TABLET  FOR  A  TOTAL  OF  7.5  MG 06/30/23   Turner, Traci R, MD  amLODipine (NORVASC) 5 MG tablet TAKE 1 TABLET BY MOUTH ONCE DAILY IN  ADDITION  TO  1  TABLET  OF  2.5  MG  FOR  A  TOTAL  OF  7.5  MG 06/30/23   Turner, Traci R, MD  atorvastatin (LIPITOR) 80 MG tablet TAKE 1 TABLET BY MOUTH ONCE DAILY AT  6  PM 09/01/23   Turner, Cornelious Bryant, MD  Camphor-Menthol-Methyl Sal 3.06-07-08 % PTCH at bedtime.  [provider]  cetirizine (ZYRTEC) 10 MG chewable tablet Chew 1 tablet (10 mg total) by mouth daily. 08/29/20   Medina-Vargas, Monina C, NP  Cholecalciferol (VITAMIN D PO) Take 3,000 Units by mouth daily.     [provider]  clopidogrel (PLAVIX) 75 MG tablet Take 1 tablet by mouth once daily 09/07/23   Jacqueline Matsu, MD  Cyanocobalamin 5000 MCG TBDP Take 5,000 mcg by mouth daily.    [provider]  docusate sodium (COLACE) 100 MG capsule Take 100 mg by mouth daily as needed.    [provider]  fluticasone (FLONASE) 50 MCG/ACT nasal spray Place 2 sprays into both nostrils daily. Patient taking differently: Place 2 sprays into both nostrils daily as needed for rhinitis. 08/29/20   Medina-Vargas, Monina C, NP  gabapentin (NEURONTIN) 300 MG capsule Take 2 capsules (600 mg total) by mouth daily. Patient taking differently: Take 300 mg by mouth daily as needed (Pain). 08/29/20   Medina-Vargas, Monina C, NP  guaiFENesin (MUCINEX) 600 MG 12 hr tablet Take 1 tablet (600 mg total) by mouth 2 (two) times daily. Patient taking differently: Take 600 mg by mouth 2 (two) times  daily as needed for to loosen phlegm. 08/29/20   Medina-Vargas, Monina C, NP  HYDROcodone-acetaminophen (NORCO/VICODIN) 5-325 MG tablet Take 1-2 tablets by mouth every 6 (six) hours as needed for moderate pain or severe pain. 10/21/22   Steinl, Kevin, MD  metoprolol tartrate (LOPRESSOR) 25 MG tablet Take 0.5 tablets (12.5 mg total) by mouth 2 (two) times daily. 04/13/23   Williams, Evan, PA-C  Multiple Vitamin (MULTIVITAMIN) capsule Take 1 capsule by mouth daily.    [provider]  nitroGLYCERIN (NITROSTAT) 0.4 MG SL tablet Place 1 tablet (0.4 mg total) under the tongue every 5 (five) minutes as needed for chest pain. 04/13/23   Williams, Evan, PA-C  ondansetron (ZOFRAN-ODT) 8 MG disintegrating tablet Take 1 tablet (8 mg total) by mouth every 8 (eight) hours as needed for nausea or vomiting. 10/21/22   Guadalupe Lee, MD  PROLIA 60 MG/ML SOSY injection Inject 60 mg into the skin every 6 (six) months. Patient not taking: Reported on 04/13/2023 02/06/21   [provider]  Tiotropium Bromide Monohydrate (SPIRIVA RESPIMAT) 1.25 MCG/ACT AERS Inhale 2 each into the lungs daily as needed (Congestion). Patient not taking: Reported on 04/13/2023 10/16/20   [provider]  Trospium Chloride 60 MG CP24 Take 1 capsule (60 mg total) by mouth daily. 08/29/20   Medina-Vargas, Nino Bass C, NP    Physical Exam: Vitals:   09/14/23 1514 09/14/23 1516  BP: (!) 138/94   Pulse: 82   Resp: 14   Temp:  98.3 F (36.8 C)  TempSrc:  Oral  SpO2: 98%   Weight:  57.2 kg  Height:  5\' 1"  (1.549 m)    Constitutional: NAD, calm, comfortable Vitals:   09/14/23 1514 09/14/23 1516  BP: (!) 138/94   Pulse: 82   Resp: 14   Temp:  98.3 F (36.8 C)  TempSrc:  Oral  SpO2: 98%   Weight:  57.2 kg  Height:  5\' 1"  (1.549 m)   Eyes: PERRL, lids and conjunctivae normal ENMT: Mucous membranes are moist. Posterior pharynx clear of any exudate or lesions.Normal dentition.  Neck: normal, supple, no masses,  no thyromegaly Respiratory: clear to auscultation bilaterally, no wheezing, no crackles. Normal respiratory effort. No accessory muscle use.  Cardiovascular: Regular rate and rhythm, no murmurs / rubs / gallops. No extremity edema. 2+  pedal pulses. No carotid bruits.  Abdomen: no tenderness, no masses palpated. No hepatosplenomegaly. Bowel sounds positive.  Musculoskeletal: no clubbing / cyanosis.  Bilateral hands with ulnar deviation.  Good ROM, no contractures. Normal muscle tone.  Skin: no rashes, lesions, ulcers. No induration Neurologic: CN 2-12 grossly intact.  Right eye visual loss.  Sensation intact, DTR normal. Strength 5/5 in all 4.  Psychiatric: Normal judgment and insight. Alert and oriented x 3. Normal mood.   Labs on Admission: I have personally reviewed following labs and imaging studies  CBC: Recent Labs  Lab 09/14/23 1520 09/14/23 1524  WBC 8.9  --   NEUTROABS 5.8  --   HGB 11.7* 12.2  HCT 35.8* 36.0  MCV 91.6  --   PLT 279  --     Basic Metabolic Panel: Recent Labs  Lab 09/14/23 1520 09/14/23 1524  NA 140 140  K 4.9 5.2*  CL 104 106  CO2 26  --   GLUCOSE 105* 102*  BUN 18 25*  CREATININE 1.23* 1.30*  CALCIUM 10.2  --     GFR: Estimated Creatinine Clearance: 23.9 mL/min (A) (by C-G formula based on SCr of 1.3 mg/dL (H)).  Liver Function Tests: Recent Labs  Lab 09/14/23 1520  AST 20  ALT 15  ALKPHOS 38  BILITOT 0.5  PROT 7.2  ALBUMIN 4.0    Urine analysis:    Component Value Date/Time   COLORURINE STRAW (A) 09/14/2023 1743   APPEARANCEUR CLEAR 09/14/2023 1743   LABSPEC 1.006 09/14/2023 1743   PHURINE 5.0 09/14/2023 1743   GLUCOSEU NEGATIVE 09/14/2023 1743   HGBUR NEGATIVE 09/14/2023 1743   BILIRUBINUR NEGATIVE 09/14/2023 1743   KETONESUR NEGATIVE 09/14/2023 1743   PROTEINUR NEGATIVE 09/14/2023 1743   UROBILINOGEN 0.2 04/03/2010 1025   NITRITE NEGATIVE 09/14/2023 1743   LEUKOCYTESUR SMALL (A) 09/14/2023 1743    Radiological Exams  on Admission: No results found.  EKG: Independently reviewed.  Normal sinus rhythm with first-degree AV block  Assessment/Plan Principal Problem:   CRAO (central retinal artery occlusion), right Active Problems:   Benign hypertensive heart disease without heart failure   CAD (coronary artery disease), native coronary artery   Hyperlipidemia LDL goal <70   Essential hypertension   COPD (chronic obstructive pulmonary disease) (HCC)   RA (rheumatoid arthritis) (HCC)   Hyperkalemia   AKI (acute kidney injury) (HCC)    #1 Central retinal artery occlusion right eye -Patient with sudden onset central retinal occlusion of the right eye occurring 1 day prior to admission, seen by ophthalmologist on day of admission and diagnosed with central retinal artery occlusion of the right eye. - Patient states she was instructed by ophthalmologist to take 4 baby aspirin prior to presentation to the ED for further evaluation by neurologist for stroke evaluation and embolic source. - Patient neurologically intact with no other neurological symptoms except right eye visual loss. - MRI/MRA of the head pending. - Will check CRP, sed rate, 2D echo, carotid ultrasound, check a fasting lipid panel. - Place on aspirin 81 mg daily. -Continue home regimen of Plavix. - Neurology consultation pending.  2.  Hypertension -Resume home regimen antihypertensive medications of metoprolol, Norvasc.  3.  Hyperlipidemia -Check a fasting lipid panel. - Resume home regimen Lipitor.  4.  History of rheumatoid arthritis -Continue home regimen Neurontin. - Outpatient follow-up with primary rheumatologist.  5.  Hyperkalemia -Repeat be met.  6.  AKI -Patient with a rise in her creatinine from her baseline with last creatinine of  0.98. - Creatinine noted at 1.30 with a BUN of 25. - Likely secondary to prerenal azotemia secondary to dehydration. - Urinalysis with small leukocytes, nitrite negative, negative protein. -  IV fluids. - Monitor urine output. - Repeat labs in the AM.  7.  CAD -Continue home regimen metoprolol, Norvasc, Lipitor. - 2D echo ordered secondary to problem #1. - Continue home regimen of Plavix.  DVT prophylaxis: Lovenox Code Status:   Full Family Communication:  Updated patient husband at bedside. Disposition Plan:   Patient is from:  Home  Anticipated DC to:  Home  Anticipated DC date:  1 to 2 days  Anticipated DC barriers: Clinical improvement Consults called: Neurology Admission status:  Place in observation.  Severity of Illness: The appropriate patient status for this patient is OBSERVATION. Observation status is judged to be reasonable and necessary in order to provide the required intensity of service to ensure the patient's safety. The patient's presenting symptoms, physical exam findings, and initial radiographic and laboratory data in the context of their medical condition is felt to place them at decreased risk for further clinical deterioration. Furthermore, it is anticipated that the patient will be medically stable for discharge from the hospital within 2 midnights of admission.     Hilda Lovings MD [facility]  How to contact the [facility] Attending or Consulting provider 7A - 7P or covering provider during after hours 7P -7A, for this patient?   Check the care team in [facility] and look for a) attending/consulting [facility] provider listed and b) the [facility] team listed Log into [email or web address] and use 's universal password to access. If you do not have the password, please contact the hospital operator. Locate the [facility] provider you are looking for under [facility] and page to a number that you can be directly reached. If you still have difficulty reaching the provider, please page the [facility] (Director on Call) for the Hospitalists listed on amion for assistance.  09/14/2023, 6:13 PM

## 2023-09-14 NOTE — Consult Note (Signed)
 NEUROLOGY CONSULT NOTE   Date of service: September 14, 2023 Patient Name: Jacqueline Burke MRN:  [id number] DOB:  1936-01-15 Chief Complaint: "Right eye vision loss" Requesting Provider: Armenta Landau, MD  History of Present Illness  Jacqueline Burke is a 88 y.o. right handed female with hx of HTN, HLD, CAD, CKD stage III a/b borderline, COPD, Left hip fx s/p repair (2022), osteoporosis, arthritic pain on gabapentin   She reports she was in her usual state of health, taking off a sweater after church when she suddenly lost vision in her right eye.  Her vision loss has been complete without any return of vision.  No other symptoms on extended review of systems including no symptoms of GCA/PMR  Husband at bedside corroborates her history   LKW: Sunday morning  Modified rankin score: 0 IV Thrombolysis: no, out of the window EVT: No, exam not c/w LVO NIHSS: 0    ROS  Comprehensive ROS performed and pertinent positives documented in HPI   Past History   Past Medical History:  Diagnosis Date   Allergic rhinitis    CAD (coronary artery disease), native coronary artery    NSTEMI with 95% RCA s/p DES to RCA 06/2017   Complication of anesthesia    makes her confused cursing etc..   Diverticulosis    GERD (gastroesophageal reflux disease)    Heart murmur    Systolic heart murmur with Aortic valve sclerosis by ECHO   History of kidney stones    Hyperlipidemia    Hypertension    Myocardial infarction (HCC)    Osteoarthritis    Erosive OA-MRI of R hand negative for synovitis, only OA-Dr Meredith Stalls   Osteopenia    Pelvic prolapse    PVC's (premature ventricular contractions)     Past Surgical History:  Procedure Laterality Date   BUNIONECTOMY  08/2006   R foot and hammer roe right second toe   CARDIAC CATHETERIZATION     CARPAL TUNNEL RELEASE  2002   bil hands   CONVERSION TO TOTAL HIP Left 08/08/2022   Procedure: CONVERSION TO TOTAL HIP FROM IM NAIL;  Surgeon: Murleen Arms, MD;   Location: [facility];  Service: Orthopedics;  Laterality: Left;   CORONARY STENT INTERVENTION N/A 06/15/2017   Procedure: CORONARY STENT INTERVENTION;  Surgeon: Avanell Leigh, MD;  Location: [facility];  Service: Cardiovascular;  Laterality: N/A;   CYSTOSCOPY     with laser lithotripsy Dr. Parke Boll 07-20-17    CYSTOSCOPY W/ URETERAL STENT PLACEMENT Left 06/09/2017   Procedure: CYSTOSCOPY WITH RETROGRADE PYELOGRAM/URETERAL STENT PLACEMENT;  Surgeon: Samson Croak, MD;  Location: [facility];  Service: Urology;  Laterality: Left;   CYSTOSCOPY WITH RETROGRADE PYELOGRAM, URETEROSCOPY AND STENT PLACEMENT Left 07/20/2017   Procedure: CYSTOSCOPY WITH RETROGRADE PYELOGRAM, LEFT URETEROSCOPY HOLMIUM LASER LITHO  AND STENT EXCHANGE;  Surgeon: Samson Croak, MD;  Location: [facility];  Service: Urology;  Laterality: Left;   HOLMIUM LASER APPLICATION Left 07/20/2017   Procedure: HOLMIUM LASER APPLICATION;  Surgeon: Samson Croak, MD;  Location: [facility];  Service: Urology;  Laterality: Left;   INTRAMEDULLARY (IM) NAIL INTERTROCHANTERIC Left 08/04/2020   Procedure: INTRAMEDULLARY (IM) NAIL INTERTROCHANTRIC WITH CABLES;  Surgeon: Jasmine Mesi, MD;  Location: [facility];  Service: Orthopedics;  Laterality: Left;   LEFT HEART CATH AND CORONARY ANGIOGRAPHY N/A 06/15/2017   Procedure: LEFT HEART CATH AND CORONARY ANGIOGRAPHY;  Surgeon: Avanell Leigh, MD;  Location: [facility];  Service: Cardiovascular;  Laterality: N/A;   OPEN REDUCTION INTERNAL FIXATION (ORIF) DISTAL RADIAL FRACTURE Left 10/23/2022   Procedure: OPEN REDUCTION INTERNAL FIXATION (ORIF) DISTAL RADIUS FRACTURE;  Surgeon: Osa Blase, MD;  Location: [facility];  Service: Orthopedics;  Laterality: Left;   TOTAL KNEE ARTHROPLASTY Left 08/2007   TOTAL KNEE ARTHROPLASTY Right 03/2010    Family History: Family History  Problem Relation Age of Onset   Stroke Mother    Diabetes Mother    Heart attack Mother    Osteoporosis Father     Breast cancer Cousin     Social History  reports that she quit smoking about 30 years ago. Her smoking use included cigarettes. She started smoking about 55 years ago. She has a 25 pack-year smoking history. She has never used smokeless tobacco. She reports that she does not drink alcohol and does not use drugs.  Allergies  Allergen Reactions   Ciprofloxacin Hives   Codeine Hives   Demerol [Meperidine] Hives   Hydrogen Peroxide Itching and Other (See Comments)    White blisters   Propoxyphene Other (See Comments)    Unknown Other reaction(s): OTHER   Tramadol Other (See Comments)    "ITCHY ON THE INSIDE"    Medications   Current Facility-Administered Medications:    [START ON 09/15/2023]  stroke: early stages of recovery book, , Does not apply, Once, Armenta Landau, MD   0.9 %  sodium chloride infusion, , Intravenous, Continuous, Armenta Landau, MD   acetaminophen (TYLENOL) tablet 650 mg, 650 mg, Oral, Q4H PRN **OR** acetaminophen (TYLENOL) 160 MG/5ML solution 650 mg, 650 mg, Per Tube, Q4H PRN **OR** acetaminophen (TYLENOL) suppository 650 mg, 650 mg, Rectal, Q4H PRN, Armenta Landau, MD   acetaminophen (TYLENOL) tablet 325 mg, 325 mg, Oral, QHS, Armenta Landau, MD   Albuterol Sulfate (PROAIR RESPICLICK) AEPB 1 puff, 1 puff, Inhalation, Daily PRN, Armenta Landau, MD   amLODipine (NORVASC) tablet 2.5 mg, 2.5 mg, Oral, Daily, Armenta Landau, MD   amLODipine (NORVASC) tablet 5 mg, 5 mg, Oral, Daily, Armenta Landau, MD   [START ON 09/15/2023] aspirin EC tablet 81 mg, 81 mg, Oral, Daily, Armenta Landau, MD   atorvastatin (LIPITOR) tablet 80 mg, 80 mg, Oral, Daily, Armenta Landau, MD   cholecalciferol (VITAMIN D3) 25 MCG (1000 UNIT) tablet 3,000 Units, 3,000 Units, Oral, Daily, Armenta Landau, MD   clopidogrel (PLAVIX) tablet 75 mg, 75 mg, Oral, Daily, Armenta Landau, MD   Cyanocobalamin TBDP 5,000 mcg, 5,000 mcg, Oral, Daily, Armenta Landau,  MD   enoxaparin (LOVENOX) injection 40 mg, 40 mg, Subcutaneous, Q24H, Armenta Landau, MD   fluticasone (FLONASE) 50 MCG/ACT nasal spray 2 spray, 2 spray, Each Nare, Daily PRN, Armenta Landau, MD   gabapentin (NEURONTIN) capsule 300 mg, 300 mg, Oral, Daily PRN, Armenta Landau, MD   loratadine (CLARITIN) tablet 10 mg, 10 mg, Oral, Daily, Armenta Landau, MD   metoprolol tartrate (LOPRESSOR) tablet 12.5 mg, 12.5 mg, Oral, BID, Armenta Landau, MD   nitroGLYCERIN (NITROSTAT) SL tablet 0.4 mg, 0.4 mg, Sublingual, Q5 min PRN, Armenta Landau, MD   ondansetron (ZOFRAN) injection 4 mg, 4 mg, Intravenous, Q6H PRN, Armenta Landau, MD   senna-docusate (Senokot-S) tablet 1 tablet, 1 tablet, Oral, QHS PRN, Armenta Landau, MD   Tiotropium Bromide Monohydrate AERS 2 each, 2 each, Inhalation, Daily PRN, Armenta Landau, MD   Trospium Chloride CP24 60 mg, 60 mg,  Oral, Daily, Rodolph Bong, MD  Current Outpatient Medications:    acetaminophen (TYLENOL) 325 MG tablet, Take 325 mg by mouth at bedtime., Disp: , Rfl:    Albuterol Sulfate (PROAIR RESPICLICK) 108 (90 Base) MCG/ACT AEPB, Inhale 1 puff into the lungs daily as needed (wheezing)., Disp: 1 each, Rfl: 0   amLODipine (NORVASC) 2.5 MG tablet, TAKE 1 TABLET BY MOUTH ONCE DAILY WITH  5  MG  TABLET  FOR  A  TOTAL  OF  7.5  MG, Disp: 90 tablet, Rfl: 3   amLODipine (NORVASC) 5 MG tablet, TAKE 1 TABLET BY MOUTH ONCE DAILY IN  ADDITION  TO  1  TABLET  OF  2.5  MG  FOR  A  TOTAL  OF  7.5  MG, Disp: 90 tablet, Rfl: 3   atorvastatin (LIPITOR) 80 MG tablet, TAKE 1 TABLET BY MOUTH ONCE DAILY AT  6  PM, Disp: 90 tablet, Rfl: 2   Camphor-Menthol-Methyl Sal 3.06-07-08 % PTCH, at bedtime., Disp: , Rfl:    cetirizine (ZYRTEC) 10 MG chewable tablet, Chew 1 tablet (10 mg total) by mouth daily., Disp: 30 tablet, Rfl: 0   Cholecalciferol (VITAMIN D PO), Take 3,000 Units by mouth daily. , Disp: , Rfl:    clopidogrel (PLAVIX) 75 MG tablet, Take 1  tablet by mouth once daily, Disp: 90 tablet, Rfl: 2   Cyanocobalamin 5000 MCG TBDP, Take 5,000 mcg by mouth daily., Disp: , Rfl:    docusate sodium (COLACE) 100 MG capsule, Take 100 mg by mouth daily as needed., Disp: , Rfl:    fluticasone (FLONASE) 50 MCG/ACT nasal spray, Place 2 sprays into both nostrils daily. (Patient taking differently: Place 2 sprays into both nostrils daily as needed for rhinitis.), Disp: 16 g, Rfl: 0   gabapentin (NEURONTIN) 300 MG capsule, Take 2 capsules (600 mg total) by mouth daily. (Patient taking differently: Take 300 mg by mouth daily as needed (Pain).), Disp: 60 capsule, Rfl: 0   guaiFENesin (MUCINEX) 600 MG 12 hr tablet, Take 1 tablet (600 mg total) by mouth 2 (two) times daily. (Patient taking differently: Take 600 mg by mouth 2 (two) times daily as needed for to loosen phlegm.), Disp: 60 tablet, Rfl: 0   HYDROcodone-acetaminophen (NORCO/VICODIN) 5-325 MG tablet, Take 1-2 tablets by mouth every 6 (six) hours as needed for moderate pain or severe pain., Disp: 20 tablet, Rfl: 0   metoprolol tartrate (LOPRESSOR) 25 MG tablet, Take 0.5 tablets (12.5 mg total) by mouth 2 (two) times daily., Disp: , Rfl:    Multiple Vitamin (MULTIVITAMIN) capsule, Take 1 capsule by mouth daily., Disp: , Rfl:    nitroGLYCERIN (NITROSTAT) 0.4 MG SL tablet, Place 1 tablet (0.4 mg total) under the tongue every 5 (five) minutes as needed for chest pain., Disp: 30 tablet, Rfl: 1   ondansetron (ZOFRAN-ODT) 8 MG disintegrating tablet, Take 1 tablet (8 mg total) by mouth every 8 (eight) hours as needed for nausea or vomiting., Disp: 10 tablet, Rfl: 0   PROLIA 60 MG/ML SOSY injection, Inject 60 mg into the skin every 6 (six) months. (Patient not taking: Reported on 04/13/2023), Disp: , Rfl:    Tiotropium Bromide Monohydrate (SPIRIVA RESPIMAT) 1.25 MCG/ACT AERS, Inhale 2 each into the lungs daily as needed (Congestion). (Patient not taking: Reported on 04/13/2023), Disp: , Rfl:    Trospium Chloride 60  MG CP24, Take 1 capsule (60 mg total) by mouth daily., Disp: 30 capsule, Rfl: 0  Vitals   Vitals:   09/14/23  1514 09-18-23 1516  BP: (!) 138/94   Pulse: 82   Resp: 14   Temp:  98.3 F (36.8 C)  TempSrc:  Oral  SpO2: 98%   Weight:  57.2 kg  Height:  5\' 1"  (1.549 m)    Body mass index is 23.81 kg/m.  Physical Exam   Constitutional: Appears well-developed and well-nourished for her age Psych: Affect appropriate to situation.  Pleasant and cooperative Eyes: No scleral injection.  HENT: No OP obstruction.  Head: Normocephalic. Cardiovascular: Perfusing extremities well Respiratory: Effort normal, non-labored breathing.  GI: Soft.  No distension. There is no tenderness.  Skin: WDI.   Neurologic Examination   Mental Status: Patient is awake, alert, oriented to person, place, month, year, and situation. Patient is able to give a clear and coherent history. No signs of aphasia or neglect Cranial Nerves: II: Visual Fields are full in the left eye.  Right eye afferent pupillary defect.  No light perception in the right eye III,IV, VI: EOMI without ptosis or diploplia.   V: Facial sensation is symmetric to light touch.  VII: Facial movement is symmetric.  VIII: hearing is intact to voice X: Uvula elevates symmetrically XI: Shoulder shrug is symmetric. XII: tongue is midline without atrophy or fasciculations.  Motor: Tone is normal. Bulk is normal. 5/5 strength was present in all four extremities, except hip flexion 4/5 bilaterally Sensory: Sensation is symmetric to light touch and temperature in the arms and legs. Deep Tendon Reflexes: 2+ and symmetric in the brachioradialis and patellae.  Brisk Cerebellar: FNF and HKS are intact bilaterally   Labs/Imaging/Neurodiagnostic studies   CBC:  Recent Labs  Lab 09/18/2023 1520 09-18-2023 1524  WBC 8.9  --   NEUTROABS 5.8  --   HGB 11.7* 12.2  HCT 35.8* 36.0  MCV 91.6  --   PLT 279  --    Basic Metabolic Panel:  Lab  Results  Component Value Date   NA 140 09-18-2023   K 5.2 (H) 09-18-2023   CO2 26 September 18, 2023   GLUCOSE 102 (H) 09-18-2023   BUN 25 (H) 09/18/23   CREATININE 1.30 (H) 2023/09/18   CALCIUM 10.2 09/18/23   GFRNONAA 43 (L) 2023-09-18   GFRAA 56 (L) 07/17/2017   Lipid Panel:  Lab Results  Component Value Date   LDLCALC 33 06/16/2017   HgbA1c:  Lab Results  Component Value Date   HGBA1C 6.1 (H) 08/04/2020    Alcohol Level     Component Value Date/Time   ETH <10 18-Sep-2023 1520   INR  Lab Results  Component Value Date   INR 1.0 09/18/23   APTT  Lab Results  Component Value Date   APTT 33 September 18, 2023    No pertinent imaging yet available   ASSESSMENT   Nyia LARRI BREWTON is a 88 y.o. female presenting with right eye CRAO for which stroke workup is indicated.  I do not have a strong concern for GCA/PMR given lack of any of the typical symptoms.  There is some concern about possible rheumatoid arthritis per primary team due to arthritic changes in her hands for which she reported to them that she saw rheumatologist and was started on gabapentin; reasonable to check an ESR/CRP but I would have a high threshold to treat with empiric steroids given the clinical history available to me at this time  RECOMMENDATIONS  # Right eye CRAO - Stroke labs ESR, CRP, HgbA1c, fasting lipid panel, UA, UDS - MRI brain  - Carotid duplex or  MRA neck w/wo due to baseline renal function - Frequent neuro checks - Echocardiogram - s/p 81 mg x 4 tabs at 1:30 PM today prior to arrival; continue ASA 81 mg daily starting tomorrow - continue home plavix 75 mg daily, course to be determined pending workup - Risk factor modification - Telemetry monitoring - Blood pressure goal   - Normotension as she is out of the window for permissive hypertension - PT consult, OT consult, no need for SLP as speech is at baseline with no swallow difficulties on nursing bedside swallow eval - Stroke team to  follow  ______________________________________________________________________    Baldwin Levee MD-PhD [facility] [phone] Available 7 AM to 7 PM, outside these hours please contact Neurologist on call listed on AMION

## 2023-09-14 NOTE — ED Provider Notes (Signed)
 [place] EMERGENCY DEPARTMENT AT [facility] Provider Note   CSN: [id number] Arrival date & time: 09/14/23  1441     History {Add pertinent medical, surgical, social history, OB history to HPI:1} Chief Complaint  Patient presents with   Loss of Vision    Jacqueline Burke is a 88 y.o. female.  She said yesterday after she returned from church when she was pulling her sweater over her head she acutely noticed that she could not see out of her right eye.  There was no pain.  She has no vision in that eye since then.  She sees intermittent flashing lights.  She went to the eye doctor today who confirmed that she had retinal infarction.  Sent her here for further stroke evaluation.  She denies any numbness or weakness headache nausea vomiting.  No prior history of stroke and not on any blood thinners.  The history is provided by the patient.  Eye Problem Location:  Right eye Quality: vision loss. Onset quality:  Sudden Duration:  24 hours Timing:  Constant Progression:  Unchanged Chronicity:  New Relieved by:  Nothing Worsened by:  Nothing Ineffective treatments:  None tried Associated symptoms: no nausea, no vomiting and no weakness        Home Medications Prior to Admission medications   Medication Sig Start Date End Date Taking? Authorizing Provider  acetaminophen (TYLENOL) 325 MG tablet Take 325 mg by mouth at bedtime.    [provider]  Albuterol Sulfate (PROAIR RESPICLICK) 108 (90 Base) MCG/ACT AEPB Inhale 1 puff into the lungs daily as needed (wheezing). 08/29/20   Medina-Vargas, Monina C, NP  amLODipine (NORVASC) 2.5 MG tablet TAKE 1 TABLET BY MOUTH ONCE DAILY WITH  5  MG  TABLET  FOR  A  TOTAL  OF  7.5  MG 06/30/23   Turner, Traci R, MD  amLODipine (NORVASC) 5 MG tablet TAKE 1 TABLET BY MOUTH ONCE DAILY IN  ADDITION  TO  1  TABLET  OF  2.5  MG  FOR  A  TOTAL  OF  7.5  MG 06/30/23   Turner, Traci R, MD  atorvastatin (LIPITOR) 80 MG tablet TAKE 1 TABLET BY  MOUTH ONCE DAILY AT  6  PM 09/01/23   Turner, Rufus Council, MD  Camphor-Menthol-Methyl Sal 3.06-07-08 % PTCH at bedtime.    [provider]  cetirizine (ZYRTEC) 10 MG chewable tablet Chew 1 tablet (10 mg total) by mouth daily. 08/29/20   Medina-Vargas, Monina C, NP  Cholecalciferol (VITAMIN D PO) Take 3,000 Units by mouth daily.     [provider]  clopidogrel (PLAVIX) 75 MG tablet Take 1 tablet by mouth once daily 09/07/23   Jacqueline Matsu, MD  Cyanocobalamin 5000 MCG TBDP Take 5,000 mcg by mouth daily.    [provider]  docusate sodium (COLACE) 100 MG capsule Take 100 mg by mouth daily as needed.    [provider]  fluticasone (FLONASE) 50 MCG/ACT nasal spray Place 2 sprays into both nostrils daily. Patient taking differently: Place 2 sprays into both nostrils daily as needed for rhinitis. 08/29/20   Medina-Vargas, Monina C, NP  gabapentin (NEURONTIN) 300 MG capsule Take 2 capsules (600 mg total) by mouth daily. Patient taking differently: Take 300 mg by mouth daily as needed (Pain). 08/29/20   Medina-Vargas, Monina C, NP  guaiFENesin (MUCINEX) 600 MG 12 hr tablet Take 1 tablet (600 mg total) by mouth 2 (two) times daily. Patient taking differently: Take  600 mg by mouth 2 (two) times daily as needed for to loosen phlegm. 08/29/20   Medina-Vargas, Monina C, NP  HYDROcodone-acetaminophen (NORCO/VICODIN) 5-325 MG tablet Take 1-2 tablets by mouth every 6 (six) hours as needed for moderate pain or severe pain. 10/21/22   Cathren Laine, MD  metoprolol tartrate (LOPRESSOR) 25 MG tablet Take 0.5 tablets (12.5 mg total) by mouth 2 (two) times daily. 04/13/23   Perlie Gold, PA-C  Multiple Vitamin (MULTIVITAMIN) capsule Take 1 capsule by mouth daily.    [provider]  nitroGLYCERIN (NITROSTAT) 0.4 MG SL tablet Place 1 tablet (0.4 mg total) under the tongue every 5 (five) minutes as needed for chest pain. 04/13/23   Perlie Gold, PA-C  ondansetron (ZOFRAN-ODT) 8 MG  disintegrating tablet Take 1 tablet (8 mg total) by mouth every 8 (eight) hours as needed for nausea or vomiting. 10/21/22   Cathren Laine, MD  PROLIA 60 MG/ML SOSY injection Inject 60 mg into the skin every 6 (six) months. Patient not taking: Reported on 04/13/2023 02/06/21   [provider]  Tiotropium Bromide Monohydrate (SPIRIVA RESPIMAT) 1.25 MCG/ACT AERS Inhale 2 each into the lungs daily as needed (Congestion). Patient not taking: Reported on 04/13/2023 10/16/20   [provider]  Trospium Chloride 60 MG CP24 Take 1 capsule (60 mg total) by mouth daily. 08/29/20   Medina-Vargas, Monina C, NP      Allergies    Ciprofloxacin, Codeine, Demerol [meperidine], Hydrogen peroxide, Propoxyphene, and Tramadol    Review of Systems   Review of Systems  Gastrointestinal:  Negative for nausea and vomiting.  Neurological:  Negative for weakness.    Physical Exam Updated Vital Signs BP (!) 138/94 (BP Location: Right Arm)   Pulse 82   Temp 98.3 F (36.8 C) (Oral)   Resp 14   Ht 5\' 1"  (1.549 m)   Wt 57.2 kg   SpO2 98%   BMI 23.81 kg/m  Physical Exam Vitals and nursing note reviewed.  Constitutional:      General: She is not in acute distress.    Appearance: Normal appearance. She is well-developed.  HENT:     Head: Normocephalic and atraumatic.  Eyes:     Extraocular Movements: Extraocular movements intact.     Conjunctiva/sclera: Conjunctivae normal.  Cardiovascular:     Rate and Rhythm: Normal rate and regular rhythm.     Heart sounds: No murmur heard. Pulmonary:     Effort: Pulmonary effort is normal. No respiratory distress.     Breath sounds: Normal breath sounds.  Abdominal:     Palpations: Abdomen is soft.     Tenderness: There is no abdominal tenderness. There is no guarding or rebound.  Musculoskeletal:        General: No deformity. Normal range of motion.     Cervical back: Neck supple.  Skin:    General: Skin is warm and dry.     Capillary Refill:  Capillary refill takes less than 2 seconds.  Neurological:     Mental Status: She is alert.     Cranial Nerves: Cranial nerve deficit (vision loss on right) present.     Sensory: No sensory deficit.     Motor: No weakness.  Psychiatric:        Mood and Affect: Mood normal.     ED Results / Procedures / Treatments   Labs (all labs ordered are listed, but only abnormal results are displayed) Labs Reviewed  CBC - Abnormal; Notable for the following components:  Result Value   Hemoglobin 11.7 (*)    HCT 35.8 (*)    All other components within normal limits  COMPREHENSIVE METABOLIC PANEL WITH GFR - Abnormal; Notable for the following components:   Glucose, Bld 105 (*)    Creatinine, Ser 1.23 (*)    GFR, Estimated 43 (*)    All other components within normal limits  I-STAT CHEM 8, ED - Abnormal; Notable for the following components:   Potassium 5.2 (*)    BUN 25 (*)    Creatinine, Ser 1.30 (*)    Glucose, Bld 102 (*)    All other components within normal limits  ETHANOL  PROTIME-INR  APTT  DIFFERENTIAL  RAPID URINE DRUG SCREEN, HOSP PERFORMED  URINALYSIS, ROUTINE W REFLEX MICROSCOPIC  CBG MONITORING, ED    EKG EKG Interpretation Date/Time:  Monday September 14 2023 15:29:02 EDT Ventricular Rate:  65 PR Interval:  214 QRS Duration:  76 QT Interval:  374 QTC Calculation: 388 R Axis:   29  Text Interpretation: Sinus rhythm with 1st degree A-V block Otherwise normal ECG When compared with ECG of 13-Apr-2023 11:41, No significant change since last tracing Confirmed by Racheal Buddle [id number]) on 09/14/2023 4:14:39 PM  Radiology No results found.  Procedures Procedures  {Document cardiac monitor, telemetry assessment procedure when appropriate:1}  Medications Ordered in ED Medications - No data to display  ED Course/ Medical Decision Making/ A&P   {   Click here for ABCD2, HEART and other calculatorsREFRESH Note before signing :1}                               Medical Decision Making  This patient complains of ***; this involves an extensive number of treatment Options and is a complaint that carries with it a high risk of complications and morbidity. The differential includes ***  I ordered, reviewed and interpreted labs, which included *** I ordered medication *** and reviewed PMP when indicated. I ordered imaging studies which included *** and I independently    visualized and interpreted imaging which showed *** Additional history obtained from *** Previous records obtained and reviewed *** I consulted *** and discussed lab and imaging findings and discussed disposition.  Cardiac monitoring reviewed, *** Social determinants considered, *** Critical Interventions: ***  After the interventions stated above, I reevaluated the patient and found *** Admission and further testing considered, ***   {Document critical care time when appropriate:1} {Document review of labs and clinical decision tools ie heart score, Chads2Vasc2 etc:1}  {Document your independent review of radiology images, and any outside records:1} {Document your discussion with family members, caretakers, and with consultants:1} {Document social determinants of health affecting pt's care:1} {Document your decision making why or why not admission, treatments were needed:1} Final Clinical Impression(s) / ED Diagnoses Final diagnoses:  None    Rx / DC Orders ED Discharge Orders     None

## 2023-09-14 NOTE — ED Notes (Signed)
 Patient transported to MRI

## 2023-09-15 ENCOUNTER — Observation Stay (HOSPITAL_BASED_OUTPATIENT_CLINIC_OR_DEPARTMENT_OTHER)

## 2023-09-15 ENCOUNTER — Observation Stay (HOSPITAL_COMMUNITY)

## 2023-09-15 DIAGNOSIS — I6529 Occlusion and stenosis of unspecified carotid artery: Secondary | ICD-10-CM | POA: Insufficient documentation

## 2023-09-15 DIAGNOSIS — N179 Acute kidney failure, unspecified: Secondary | ICD-10-CM | POA: Diagnosis not present

## 2023-09-15 DIAGNOSIS — M069 Rheumatoid arthritis, unspecified: Secondary | ICD-10-CM | POA: Diagnosis not present

## 2023-09-15 DIAGNOSIS — Z7982 Long term (current) use of aspirin: Secondary | ICD-10-CM | POA: Diagnosis not present

## 2023-09-15 DIAGNOSIS — I6521 Occlusion and stenosis of right carotid artery: Secondary | ICD-10-CM | POA: Diagnosis not present

## 2023-09-15 DIAGNOSIS — Z79899 Other long term (current) drug therapy: Secondary | ICD-10-CM | POA: Diagnosis not present

## 2023-09-15 DIAGNOSIS — H3411 Central retinal artery occlusion, right eye: Secondary | ICD-10-CM | POA: Diagnosis not present

## 2023-09-15 DIAGNOSIS — I2510 Atherosclerotic heart disease of native coronary artery without angina pectoris: Secondary | ICD-10-CM | POA: Diagnosis not present

## 2023-09-15 DIAGNOSIS — I6340 Cerebral infarction due to embolism of unspecified cerebral artery: Secondary | ICD-10-CM | POA: Diagnosis not present

## 2023-09-15 DIAGNOSIS — I10 Essential (primary) hypertension: Secondary | ICD-10-CM | POA: Diagnosis not present

## 2023-09-15 DIAGNOSIS — I119 Hypertensive heart disease without heart failure: Secondary | ICD-10-CM | POA: Diagnosis not present

## 2023-09-15 DIAGNOSIS — N1831 Chronic kidney disease, stage 3a: Secondary | ICD-10-CM | POA: Diagnosis not present

## 2023-09-15 DIAGNOSIS — I358 Other nonrheumatic aortic valve disorders: Secondary | ICD-10-CM | POA: Diagnosis not present

## 2023-09-15 DIAGNOSIS — E785 Hyperlipidemia, unspecified: Secondary | ICD-10-CM | POA: Diagnosis not present

## 2023-09-15 DIAGNOSIS — I63131 Cerebral infarction due to embolism of right carotid artery: Secondary | ICD-10-CM | POA: Diagnosis not present

## 2023-09-15 DIAGNOSIS — I6523 Occlusion and stenosis of bilateral carotid arteries: Secondary | ICD-10-CM | POA: Diagnosis not present

## 2023-09-15 DIAGNOSIS — E875 Hyperkalemia: Secondary | ICD-10-CM | POA: Diagnosis not present

## 2023-09-15 DIAGNOSIS — E860 Dehydration: Secondary | ICD-10-CM | POA: Diagnosis not present

## 2023-09-15 DIAGNOSIS — Z7902 Long term (current) use of antithrombotics/antiplatelets: Secondary | ICD-10-CM | POA: Diagnosis not present

## 2023-09-15 DIAGNOSIS — G90A Postural orthostatic tachycardia syndrome (POTS): Secondary | ICD-10-CM | POA: Diagnosis not present

## 2023-09-15 DIAGNOSIS — I129 Hypertensive chronic kidney disease with stage 1 through stage 4 chronic kidney disease, or unspecified chronic kidney disease: Secondary | ICD-10-CM | POA: Diagnosis not present

## 2023-09-15 DIAGNOSIS — N183 Chronic kidney disease, stage 3 unspecified: Secondary | ICD-10-CM | POA: Diagnosis not present

## 2023-09-15 DIAGNOSIS — I639 Cerebral infarction, unspecified: Secondary | ICD-10-CM | POA: Diagnosis not present

## 2023-09-15 DIAGNOSIS — I252 Old myocardial infarction: Secondary | ICD-10-CM | POA: Diagnosis not present

## 2023-09-15 DIAGNOSIS — J449 Chronic obstructive pulmonary disease, unspecified: Secondary | ICD-10-CM | POA: Diagnosis not present

## 2023-09-15 DIAGNOSIS — H5340 Unspecified visual field defects: Secondary | ICD-10-CM | POA: Diagnosis not present

## 2023-09-15 DIAGNOSIS — Z96653 Presence of artificial knee joint, bilateral: Secondary | ICD-10-CM | POA: Diagnosis not present

## 2023-09-15 DIAGNOSIS — H5461 Unqualified visual loss, right eye, normal vision left eye: Secondary | ICD-10-CM | POA: Diagnosis not present

## 2023-09-15 DIAGNOSIS — M810 Age-related osteoporosis without current pathological fracture: Secondary | ICD-10-CM | POA: Diagnosis not present

## 2023-09-15 DIAGNOSIS — Z87891 Personal history of nicotine dependence: Secondary | ICD-10-CM | POA: Diagnosis not present

## 2023-09-15 DIAGNOSIS — I1310 Hypertensive heart and chronic kidney disease without heart failure, with stage 1 through stage 4 chronic kidney disease, or unspecified chronic kidney disease: Secondary | ICD-10-CM | POA: Diagnosis not present

## 2023-09-15 LAB — ECHOCARDIOGRAM COMPLETE
AR max vel: 2.81 cm2
AV Area VTI: 2.59 cm2
AV Area mean vel: 2.47 cm2
AV Mean grad: 3 mmHg
AV Peak grad: 4.3 mmHg
Ao pk vel: 1.04 m/s
Area-P 1/2: 1.68 cm2
Height: 61 in
Weight: 2016 [oz_av]

## 2023-09-15 LAB — CBC
HCT: 30.8 % — ABNORMAL LOW (ref 36.0–46.0)
Hemoglobin: 10 g/dL — ABNORMAL LOW (ref 12.0–15.0)
MCH: 30 pg (ref 26.0–34.0)
MCHC: 32.5 g/dL (ref 30.0–36.0)
MCV: 92.5 fL (ref 80.0–100.0)
Platelets: 206 10*3/uL (ref 150–400)
RBC: 3.33 MIL/uL — ABNORMAL LOW (ref 3.87–5.11)
RDW: 15.2 % (ref 11.5–15.5)
WBC: 9.2 10*3/uL (ref 4.0–10.5)
nRBC: 0 % (ref 0.0–0.2)

## 2023-09-15 LAB — BASIC METABOLIC PANEL WITH GFR
Anion gap: 9 (ref 5–15)
BUN: 11 mg/dL (ref 8–23)
CO2: 21 mmol/L — ABNORMAL LOW (ref 22–32)
Calcium: 8.6 mg/dL — ABNORMAL LOW (ref 8.9–10.3)
Chloride: 111 mmol/L (ref 98–111)
Creatinine, Ser: 0.96 mg/dL (ref 0.44–1.00)
GFR, Estimated: 58 mL/min — ABNORMAL LOW (ref >60)
Glucose, Bld: 91 mg/dL (ref 70–99)
Potassium: 3.9 mmol/L (ref 3.5–5.1)
Sodium: 141 mmol/L (ref 135–145)

## 2023-09-15 LAB — HEMOGLOBIN A1C
Hgb A1c MFr Bld: 5.8 % — ABNORMAL HIGH (ref 4.8–5.6)
Mean Plasma Glucose: 119.76 mg/dL

## 2023-09-15 LAB — MAGNESIUM: Magnesium: 1.6 mg/dL — ABNORMAL LOW (ref 1.7–2.4)

## 2023-09-15 LAB — LIPID PANEL
Cholesterol: 130 mg/dL (ref 0–200)
HDL: 51 mg/dL (ref >40)
LDL Cholesterol: 57 mg/dL (ref 0–99)
Total CHOL/HDL Ratio: 2.5 ratio
Triglycerides: 110 mg/dL (ref <150)
VLDL: 22 mg/dL (ref 0–40)

## 2023-09-15 MED ORDER — MAGNESIUM SULFATE 4 GM/100ML IV SOLN
4.0000 g | Freq: Once | INTRAVENOUS | Status: AC
  Administered 2023-09-15: 4 g via INTRAVENOUS
  Filled 2023-09-15: qty 100

## 2023-09-15 MED ORDER — IOHEXOL 350 MG/ML SOLN
75.0000 mL | Freq: Once | INTRAVENOUS | Status: AC | PRN
  Administered 2023-09-15: 75 mL via INTRAVENOUS

## 2023-09-15 NOTE — Care Management Obs Status (Signed)
 MEDICARE OBSERVATION STATUS NOTIFICATION   Patient Details  Name: VENESHA PETRAITIS MRN: [id number] Date of Birth: [birth year]   Medicare Observation Status Notification Given:       Jonathan Neighbor, RN 09/15/2023, 2:49 PM

## 2023-09-15 NOTE — Consult Note (Addendum)
 Hospital Consult    Reason for Consult:  carotid artery stenosis Requesting Physician:  Cleone Dad MRN #:  [id number]  History of Present Illness: This is a 88 y.o. female who presented to the hospital with sudden loss of vision in the right eye that has not returned.  She did have an MRA/MRI yesterday that revealed punctate acute/early subacute infarct in the right corona radiata and mild chronic small vessel ischemic disease and moderate left V4 stenosis otherwise negative.  CTA head neck today revealed right carotid system patent from origin to skull base and 50% stenosis at the origin of the right cervical ICA as well as stenosis at the ECA.  The left did not have hemodynamically significant stenosis. The small right corona radiata infarct on MRI was not well appreciated on CT.  Vascular surgery consulted for further evaluation.  Pt also has hx of HTN, CAD with hx of NSTEMI and hx of PCI of RCA with DES, hx of E coli bacteremia in 2019.  She is followed by cardiology.   Pt states that she was taking her sweater off after church and her right eye went completely dark.  She states this has not improved.  She has hx of eye surgery and felt it may be due to that.  She did see her eye doctor and they felt she had a stroke and advised her to go to the ER.  She states she did take 4 baby aspirin tablets.  She denies any one sided weakness, numbness or paralysis.  She has not had any speech difficulties.  This has never happened before.    She denies any claudication, rest pain or non healing wounds.    The pt is on a statin for cholesterol management.  The pt is  on a daily aspirin.   Other AC:  Plavix   (Lovenox for DVT prophylaxis) The pt is on BB for hypertension.   The pt is not on medication for diabetes PTA. Tobacco hx:  former  Past Medical History:  Diagnosis Date   Allergic rhinitis    CAD (coronary artery disease), native coronary artery    NSTEMI with 95% RCA s/p DES to RCA 06/2017    Complication of anesthesia    makes her confused cursing etc..   Diverticulosis    GERD (gastroesophageal reflux disease)    Heart murmur    Systolic heart murmur with Aortic valve sclerosis by ECHO   History of kidney stones    Hyperlipidemia    Hypertension    Myocardial infarction (HCC)    Osteoarthritis    Erosive OA-MRI of R hand negative for synovitis, only OA-Dr Meredith Stalls   Osteopenia    Pelvic prolapse    PVC's (premature ventricular contractions)     Past Surgical History:  Procedure Laterality Date   BUNIONECTOMY  08/2006   R foot and hammer roe right second toe   CARDIAC CATHETERIZATION     CARPAL TUNNEL RELEASE  2002   bil hands   CONVERSION TO TOTAL HIP Left 08/08/2022   Procedure: CONVERSION TO TOTAL HIP FROM IM NAIL;  Surgeon: Murleen Arms, MD;  Location: [facility];  Service: Orthopedics;  Laterality: Left;   CORONARY STENT INTERVENTION N/A 06/15/2017   Procedure: CORONARY STENT INTERVENTION;  Surgeon: Avanell Leigh, MD;  Location: [facility];  Service: Cardiovascular;  Laterality: N/A;   CYSTOSCOPY     with laser lithotripsy Dr. Parke Boll 07-20-17    CYSTOSCOPY W/ URETERAL STENT PLACEMENT Left  06/09/2017   Procedure: CYSTOSCOPY WITH RETROGRADE PYELOGRAM/URETERAL STENT PLACEMENT;  Surgeon: Crista Elliot, MD;  Location: [facility];  Service: Urology;  Laterality: Left;   CYSTOSCOPY WITH RETROGRADE PYELOGRAM, URETEROSCOPY AND STENT PLACEMENT Left 07/20/2017   Procedure: CYSTOSCOPY WITH RETROGRADE PYELOGRAM, LEFT URETEROSCOPY HOLMIUM LASER LITHO  AND STENT EXCHANGE;  Surgeon: Crista Elliot, MD;  Location: [facility];  Service: Urology;  Laterality: Left;   HOLMIUM LASER APPLICATION Left 07/20/2017   Procedure: HOLMIUM LASER APPLICATION;  Surgeon: Crista Elliot, MD;  Location: [facility];  Service: Urology;  Laterality: Left;   INTRAMEDULLARY (IM) NAIL INTERTROCHANTERIC Left 08/04/2020   Procedure: INTRAMEDULLARY (IM) NAIL INTERTROCHANTRIC WITH CABLES;  Surgeon: Cammy Copa, MD;  Location: [facility];  Service: Orthopedics;  Laterality: Left;   LEFT HEART CATH AND CORONARY ANGIOGRAPHY N/A 06/15/2017   Procedure: LEFT HEART CATH AND CORONARY ANGIOGRAPHY;  Surgeon: Runell Gess, MD;  Location: [facility];  Service: Cardiovascular;  Laterality: N/A;   OPEN REDUCTION INTERNAL FIXATION (ORIF) DISTAL RADIAL FRACTURE Left 10/23/2022   Procedure: OPEN REDUCTION INTERNAL FIXATION (ORIF) DISTAL RADIUS FRACTURE;  Surgeon: Teryl Lucy, MD;  Location: [facility];  Service: Orthopedics;  Laterality: Left;   TOTAL KNEE ARTHROPLASTY Left 08/2007   TOTAL KNEE ARTHROPLASTY Right 03/2010    Allergies  Allergen Reactions   Ciprofloxacin Hives   Codeine Hives   Demerol [Meperidine] Hives   Hydrogen Peroxide Itching and Other (See Comments)    White blisters   Propoxyphene Other (See Comments)    Unknown Other reaction(s): OTHER   Tramadol Other (See Comments)    "ITCHY ON THE INSIDE"    Prior to Admission medications   Medication Sig Start Date End Date Taking? Authorizing Provider  acetaminophen (TYLENOL) 325 MG tablet Take 325 mg by mouth at bedtime.   Yes [provider]  Albuterol Sulfate (PROAIR RESPICLICK) 108 (90 Base) MCG/ACT AEPB Inhale 1 puff into the lungs daily as needed (wheezing). 08/29/20  Yes Medina-Vargas, Monina C, NP  amLODipine (NORVASC) 2.5 MG tablet TAKE 1 TABLET BY MOUTH ONCE DAILY WITH  5  MG  TABLET  FOR  A  TOTAL  OF  7.5  MG 06/30/23  Yes Turner, Traci R, MD  amLODipine (NORVASC) 5 MG tablet TAKE 1 TABLET BY MOUTH ONCE DAILY IN  ADDITION  TO  1  TABLET  OF  2.5  MG  FOR  A  TOTAL  OF  7.5  MG 06/30/23  Yes Turner, Traci R, MD  atorvastatin (LIPITOR) 80 MG tablet TAKE 1 TABLET BY MOUTH ONCE DAILY AT  6  PM 09/01/23  Yes Turner, Cornelious Bryant, MD  cetirizine (ZYRTEC) 10 MG chewable tablet Chew 1 tablet (10 mg total) by mouth daily. 08/29/20  Yes Medina-Vargas, Monina C, NP  Cholecalciferol (VITAMIN D PO) Take 3,000 Units by mouth  daily.    Yes [provider]  clopidogrel (PLAVIX) 75 MG tablet Take 1 tablet by mouth once daily 09/07/23  Yes Turner, Cornelious Bryant, MD  Cyanocobalamin 5000 MCG TBDP Take 5,000 mcg by mouth daily.   Yes [provider]  fluticasone (FLONASE) 50 MCG/ACT nasal spray Place 2 sprays into both nostrils daily. Patient taking differently: Place 2 sprays into both nostrils daily as needed for rhinitis. 08/29/20  Yes Medina-Vargas, Monina C, NP  gabapentin (NEURONTIN) 300 MG capsule Take 2 capsules (600 mg total) by mouth daily. Patient taking differently: Take 300 mg by mouth at bedtime. 08/29/20  Yes Medina-Vargas, Monina C, NP  guaiFENesin (MUCINEX) 600 MG 12 hr tablet Take 1 tablet (600 mg total) by mouth 2 (two) times daily. Patient taking differently: Take 600 mg by mouth 2 (two) times daily as needed for to loosen phlegm. 08/29/20  Yes Medina-Vargas, Monina C, NP  metoprolol tartrate (LOPRESSOR) 25 MG tablet Take 0.5 tablets (12.5 mg total) by mouth 2 (two) times daily. 04/13/23  Yes Williams, Evan, PA-C  Multiple Vitamin (MULTIVITAMIN) capsule Take 1 capsule by mouth daily.   Yes [provider]  nitroGLYCERIN (NITROSTAT) 0.4 MG SL tablet Place 1 tablet (0.4 mg total) under the tongue every 5 (five) minutes as needed for chest pain. 04/13/23  Yes Williams, Evan, PA-C  ondansetron (ZOFRAN-ODT) 8 MG disintegrating tablet Take 1 tablet (8 mg total) by mouth every 8 (eight) hours as needed for nausea or vomiting. 10/21/22  Yes Guadalupe Lee, MD  Tiotropium Bromide Monohydrate (SPIRIVA RESPIMAT) 1.25 MCG/ACT AERS Inhale 2 each into the lungs daily as needed (Congestion). 10/16/20  Yes [provider]  TYMLOS 3120 MCG/1.56ML SOPN Inject 80 mcg into the skin at bedtime. 07/09/23  Yes [provider]  Camphor-Menthol-Methyl Sal 3.06-07-08 % PTCH at bedtime.    [provider]  HYDROcodone-acetaminophen (NORCO/VICODIN) 5-325 MG tablet Take 1-2 tablets by mouth every 6  (six) hours as needed for moderate pain or severe pain. 10/21/22   Guadalupe Lee, MD    Social History   Socioeconomic History   Marital status: Married    Spouse name: Not on file   Number of children: Not on file   Years of education: Not on file   Highest education level: Not on file  Occupational History   Occupation: Retired  Tobacco Use   Smoking status: Former    Current packs/day: 0.00    Average packs/day: 1 pack/day for 25.0 years (25.0 ttl pk-yrs)    Types: Cigarettes    Start date: 06/02/1968    Quit date: 06/02/1993    Years since quitting: 30.3   Smokeless tobacco: Never  Vaping Use   Vaping status: Never Used  Substance and Sexual Activity   Alcohol use: No   Drug use: No   Sexual activity: Not Currently  Other Topics Concern   Not on file  Social History Narrative   Not on file   Social Drivers of Health   Financial Resource Strain: Not on file  Food Insecurity: No Food Insecurity (09/15/2023)   Hunger Vital Sign    Worried About Running Out of Food in the Last Year: Never true    Ran Out of Food in the Last Year: Never true  Transportation Needs: No Transportation Needs (09/15/2023)   PRAPARE - Administrator, Civil Service (Medical): No    Lack of Transportation (Non-Medical): No  Physical Activity: Inactive (10/29/2017)   Exercise Vital Sign    Days of Exercise per Week: 0 days    Minutes of Exercise per Session: 0 min  Stress: No Stress Concern Present (10/29/2017)   [employer] of Occupational Health - Occupational Stress Questionnaire    Feeling of Stress : Only a little  Social Connections: Moderately Integrated (09/15/2023)   Social Connection and Isolation Panel [NHANES]    Frequency of Communication with Friends and Family: More than three times a week    Frequency of Social Gatherings with Friends and Family: Once a week    Attends Religious Services: More than 4 times per year    Active Member  of Clubs or Organizations: No     Attends Banker Meetings: Never    Marital Status: Married  Catering manager Violence: Not At Risk (09/15/2023)   Humiliation, Afraid, Rape, and Kick questionnaire    Fear of Current or Ex-Partner: No    Emotionally Abused: No    Physically Abused: No    Sexually Abused: No     Family History  Problem Relation Age of Onset   Stroke Mother    Diabetes Mother    Heart attack Mother    Osteoporosis Father    Breast cancer Cousin     ROS: [x]  Positive   [ ]  Negative   [ ]  All sytems reviewed and are negative  Cardiac: [x]  hx NSTEMI with stent   Vascular: []  pain in legs while walking []  pain in legs at rest []  pain in legs at night []  non-healing ulcers  Pulmonary: []  asthma/wheezing []  home O2  Neurologic: [x]  sudden right eye blindness   Hematologic: []  hx of cancer  Endocrine:   []  diabetes []  thyroid disease  GI [x]  GERD  GU: []  CKD/renal failure []  HD--[]  M/W/F or []  T/T/S  Psychiatric: []  anxiety []  depression  Musculoskeletal: [x]  arthritis []  joint pain  Integumentary: []  rashes []  ulcers  Constitutional: []  fever  []  chills  Physical Examination  Vitals:   09/15/23 0900 09/15/23 1107  BP: (!) 148/79   Pulse: 63   Resp: (!) 25   Temp:  98.1 F (36.7 C)  SpO2: 99%    Body mass index is 23.81 kg/m.  General:  WDWN in NAD Gait: Not observed HENT: WNL, normocephalic Pulmonary: normal non-labored breathing Cardiac: regular Skin: without rashes Vascular Exam/Pulses:  Right Left  Radial 2+ (normal) 2+ (normal)  DP 2+ (normal) 2+ (normal)  PT 2+ (normal) Unable to palpate   Extremities: no ulcerations Musculoskeletal: no muscle wasting or atrophy  Neurologic: A&O X 3 Psychiatric:  The pt has Normal affect.   CBC    Component Value Date/Time   WBC 8.9 09/14/2023 1520   RBC 3.91 09/14/2023 1520   HGB 12.2 09/14/2023 1524   HGB 9.7 (L) 09/05/2022 0918   HGB 11.5 02/24/2018 0918   HCT 36.0 09/14/2023 1524    HCT 34.4 02/24/2018 0918   PLT 279 09/14/2023 1520   PLT 349 09/05/2022 0918   PLT 256 02/24/2018 0918   MCV 91.6 09/14/2023 1520   MCV 91 02/24/2018 0918   MCH 29.9 09/14/2023 1520   MCHC 32.7 09/14/2023 1520   RDW 15.4 09/14/2023 1520   RDW 13.8 02/24/2018 0918   LYMPHSABS 2.0 09/14/2023 1520   MONOABS 0.7 09/14/2023 1520   EOSABS 0.2 09/14/2023 1520   BASOSABS 0.1 09/14/2023 1520    BMET    Component Value Date/Time   NA 142 09/14/2023 1925   NA 143 07/02/2017 0856   K 4.5 09/14/2023 1925   CL 107 09/14/2023 1925   CO2 25 09/14/2023 1925   GLUCOSE 105 (H) 09/14/2023 1925   BUN 19 09/14/2023 1925   BUN 24 07/02/2017 0856   CREATININE 1.12 (H) 09/14/2023 1925   CREATININE 0.78 09/05/2022 0918   CALCIUM 10.3 09/14/2023 1925   CALCIUM 9.9 07/25/2022 1000   GFRNONAA 48 (L) 09/14/2023 1925   GFRNONAA >60 09/05/2022 0918   GFRAA 56 (L) 07/17/2017 0925    COAGS: Lab Results  Component Value Date   INR 1.0 09/14/2023   INR 1.0 08/03/2020   INR 1.04 06/15/2017  Non-Invasive Vascular Imaging:   CTA head/neck 09/15/2023: IMPRESSION: No large vessel occlusion.   No acute intracranial hemorrhage. The small right corona radiata infarct noted on MRI is not well appreciated on the current study.   Atherosclerosis of the arteries in the neck. Approximately 50% stenosis at the origin of the right cervical ICA.   Intracranial atherosclerosis as above. Moderate stenosis of the V4 segment left vertebral artery. Mild stenosis of the right supraclinoid ICA. Severe stenosis of a small M2 inferior division branch of the right MCA.    ASSESSMENT/PLAN: This is a 88 y.o. female with onset of sudden right eye blindness felt to be CVA.     -CTA revealed ~ 50% stenosis at the origin of right cervical ICA.  A carotid duplex has been ordered to further evaluate her carotid system.  On duplex in March 2017, she did have 50-69% right ICA stenosis on duplex.  -pt is on asa/statin/plavix   -Dr. Susi Eric to evaluate pt and determine further plan   Maryanna Smart, PA-C [facility] [phone]   VASCULAR STAFF ADDENDUM: I have independently interviewed and examined the patient. I agree with the above.   88 y/o female presenting with acute loss of vision in the right eye on Sunday afternoon.  Workup revealed a right retinal artery occlusion as well as a subacute infarct in the right corona radiata.  CTA demonstrated an approximately 50% stenosis in the proximal right ICA.  Overall she is very independent and functional and reports that her and her husband do their own yard work including Surveyor, minerals a Administrator, Civil Service recently and helps their other neighbors around the community. I explained that she is now considered a symptomatic carotid stenosis and explained that this puts her at a higher risk of stroke over the next 2 years.  I specifically quoted a 25 to 26% risk of ipsilateral stroke over the next 2 years without intervention.  I explained that with age of intervention that her risk drops to less than half of that.    I explained the 3 modalities of treatment with best medical therapy, carotid endarterectomy and carotid stenting. We discussed the risks and benefits of carotid intrevention.  I informed that there is a small risk of stroke during the procedure which is about 1 to 2%.  We also discussed the risk of cranial nerve injury, bleeding, infection and the small risk of restenosis in the future. I offered TCAR based due to her age. Tentatively planning for Thursday 4/17   Philipp Brawn MD [facility] Phone Number: [phone] 09/15/2023 2:25 PM

## 2023-09-15 NOTE — Progress Notes (Signed)
 Patient arrived on the unit. Oriented to the floor. Bed is locked in the lowest position and call bell within reach. Patient educated to call for assistance to the restroom or for any needs. Patient voiced understanding.

## 2023-09-15 NOTE — Evaluation (Signed)
 Occupational Therapy Evaluation Patient Details Name: Jacqueline Burke MRN: [id number] DOB: [birth year] Today's Date: 09/15/2023   History of Present Illness   88 y.o. female who presented to the hospital with sudden loss of vision in the right eye. Pt with R CRAO.  MRA/MRI that revealed punctate acute/early subacute infarct in the right corona radiata and mild chronic small vessel ischemic disease and moderate left V4 stenosis. Vascular following for possible carotid endarterectomy and carotid stenting. PMH: HTN, CAD with hx of NSTEMI and hx of PCI of RCA with DES, hx of E coli bacteremia in 2019.     Clinical Impressions PTA pt lives independently with her husband, drives, is retired from working as a Insurance account manager and enjoys Lexicographer. Pt only has vision in L eye, which she states is a "little blurry". Encouraged her to have her husband bring in her glasses. In addition to reduced visual acuity, pt demonstrates decreased depth perception due to monocular vision. Began education on use of therapeutic activities to address compensatory strategies for deficits. Recommend follow up with OT at a neuro outpt center. Pt states she believes she is having surgery on Thursday. Will follow up post procedure.      If plan is discharge home, recommend the following:   Assistance with cooking/housework;Assist for transportation;Help with stairs or ramp for entrance     Functional Status Assessment   Patient has had a recent decline in their functional status and demonstrates the ability to make significant improvements in function in a reasonable and predictable amount of time.     Equipment Recommendations   None recommended by OT     Recommendations for Other Services         Precautions/Restrictions   Precautions Precautions: Fall Restrictions Weight Bearing Restrictions Per Provider Order: No     Mobility Bed Mobility Overal bed mobility: Modified Independent                   Transfers Overall transfer level: Needs assistance Equipment used: None Transfers: Sit to/from Stand Sit to Stand: Supervision                  Balance Overall balance assessment: Mild deficits observed, not formally tested                                         ADL either performed or assessed with clinical judgement   ADL Overall ADL's : Needs assistance/impaired Eating/Feeding: Independent   Grooming: Set up;Standing;Supervision/safety   Upper Body Bathing: Set up;Sitting   Lower Body Bathing: Supervison/ safety;Set up;Sit to/from stand   Upper Body Dressing : Set up;Sitting   Lower Body Dressing: Set up;Supervision/safety;Sit to/from stand   Toilet Transfer: Supervision/safety;Ambulation   Toileting- Clothing Manipulation and Hygiene: Modified independent       Functional mobility during ADLs: Contact guard assist       Vision Baseline Vision/History: 1 Wears glasses Ability to See in Adequate Light: 1 Impaired Patient Visual Report: Blurring of vision Vision Assessment?: Wears glasses for reading Additional Comments: R eye blindness; able to only detect movement; No blink to threat     Perception         Praxis         Pertinent Vitals/Pain Pain Assessment Pain Assessment: No/denies pain     Extremity/Trunk Assessment Upper Extremity Assessment Upper Extremity Assessment: Overall WFL for tasks assessed  Lower Extremity Assessment Lower Extremity Assessment: Defer to PT evaluation   Cervical / Trunk Assessment Cervical / Trunk Assessment: Normal   Communication Communication Communication: No apparent difficulties   Cognition Arousal: Alert Behavior During Therapy: WFL for tasks assessed/performed Cognition: No apparent impairments                               Following commands: Intact       Cueing  General Comments          Exercises Exercises: Other exercises Other  Exercises Other Exercises: depth perception activities Other Exercises: eye-hand coordiantion tasks   Shoulder Instructions      Home Living Family/patient expects to be discharged to:: Private residence Living Arrangements: Spouse/significant other Available Help at Discharge: Family;Available 24 hours/day Type of Home: House Home Access: Stairs to enter [employer] of Steps: 3 Entrance Stairs-Rails: Left Home Layout: One level     Bathroom Shower/Tub: Producer, television/film/video: Standard Bathroom Accessibility: Yes How Accessible: Accessible via walker Home Equipment: Rolling Walker (2 wheels);Shower seat;BSC/3in1;Cane - single point          Prior Functioning/Environment Prior Level of Function : Independent/Modified Independent;Driving (foster [employer])                    OT Problem List: Impaired vision/perception   OT Treatment/Interventions: Self-care/ADL training;DME and/or AE instruction;Neuromuscular education;Visual/perceptual remediation/compensation;Patient/family education;Balance training      OT Goals(Current goals can be found in the care plan section)   Acute Rehab OT Goals Patient Stated Goal: to learn how to cope with her reduced vision OT Goal Formulation: With patient Time For Goal Achievement: 09/29/23 Potential to Achieve Goals: Good   OT Frequency:  Min 2X/week    Co-evaluation              AM-PAC OT "6 Clicks" Daily Activity     Outcome Measure Help from another person eating meals?: None Help from another person taking care of personal grooming?: A Little Help from another person toileting, which includes using toliet, bedpan, or urinal?: A Little Help from another person bathing (including washing, rinsing, drying)?: A Little Help from another person to put on and taking off regular upper body clothing?: A Little Help from another person to put on and taking off regular lower body clothing?: A Little 6  Click Score: 19   End of Session Nurse Communication: Mobility status  Activity Tolerance: Patient tolerated treatment well Patient left: in bed;with call bell/phone within reach;with bed alarm set  OT Visit Diagnosis: Other abnormalities of gait and mobility (R26.89);Low vision, both eyes (H54.2)                Time: 5409-8119 OT Time Calculation (min): 27 min Charges:  OT General Charges $OT Visit: 1 Visit OT Evaluation $OT Eval Low Complexity: 1 Low OT Treatments $Therapeutic Activity: 8-22 mins  Milburn Aliment, OT/L   Acute OT Clinical Specialist Acute Rehabilitation Services Pager [phone] Office [phone]   [facility] 09/15/2023, 4:32 PM

## 2023-09-15 NOTE — TOC Initial Note (Signed)
 Transition of Care [facility]) - Initial/Assessment Note    Patient Details  Name: Jacqueline Burke MRN: [id number] Date of Birth: Sep 14, 1935  Transition of Care [facility]) CM/SW Contact:    Jonathan Neighbor, RN Phone Number: 09/15/2023, 2:59 PM  Clinical Narrative:                  Pt is from home with her spouse. They are together most of the time.  Has DME but doesn't actively use it.  She manages her own medications and denies any issues.  Outpatient therapy arranged with [facility]. Information on the AVS.  Spouse can provide needed transportation. Plan: TCAR --Tentatively planning for Thursday 4/17 [facility] following  Expected Discharge Plan: OP Rehab Barriers to Discharge: Continued Medical Work up   Patient Goals and CMS Choice            Expected Discharge Plan and Services       Living arrangements for the past 2 months: Single Family Home                                      Prior Living Arrangements/Services Living arrangements for the past 2 months: Single Family Home Lives with:: Spouse Patient language and need for interpreter reviewed:: Yes Do you feel safe going back to the place where you live?: Yes        Care giver support system in place?: Yes (comment) Current home services: DME (walker/ rollator/ cane/ shower seat/ [employer]) Criminal Activity/Legal Involvement Pertinent to Current Situation/Hospitalization: No - Comment as needed  Activities of Daily Living   ADL Screening (condition at time of admission) Independently performs ADLs?: Yes (appropriate for developmental age) Is the patient deaf or have difficulty hearing?: No Does the patient have difficulty seeing, even when wearing glasses/contacts?: Yes Does the patient have difficulty concentrating, remembering, or making decisions?: No  Permission Sought/Granted                  Emotional Assessment Appearance:: Appears stated age Attitude/Demeanor/Rapport: Engaged Affect  (typically observed): Accepting Orientation: : Oriented to Self, Oriented to Place, Oriented to  Time, Oriented to Situation   Psych Involvement: No (comment)  Admission diagnosis:  CRAO (central retinal artery occlusion), right [H34.11] Cerebral infarction, unspecified mechanism [facility]) [I63.9] Patient Active Problem List   Diagnosis Date Noted   Acute CVA (cerebrovascular accident) (HCC) 09/15/2023   CRAO (central retinal artery occlusion), right 09/14/2023   RA (rheumatoid arthritis) (HCC) 09/14/2023   Hyperkalemia 09/14/2023   AKI (acute kidney injury) (HCC) 09/14/2023   Senile osteoporosis 01/16/2023   Osteoarthritis of left hip, unspecified osteoarthritis type 08/08/2022   Nocturnal dyspnea 10/22/2021   COPD (chronic obstructive pulmonary disease) (HCC) 02/01/2021   Normochromic normocytic anemia 08/09/2020   Essential hypertension 08/03/2020   CKD (chronic kidney disease), stage III (HCC) 08/03/2020   Closed intertrochanteric fracture of hip, left, initial encounter (HCC) 08/03/2020   Pulmonary fibrosis (HCC) 08/03/2020   Cough 03/20/2020   CAD (coronary artery disease), native coronary artery 08/25/2017   Hyperlipidemia LDL goal <70 08/25/2017   NSTEMI (non-ST elevated myocardial infarction) (HCC) 06/12/2017   Chronic chest pain    Ureteral calculus 06/09/2017   Edema of extremities 02/16/2014   Sinusitis, chronic 11/11/2013   Dyspnea 11/11/2013   PVC's (premature ventricular contractions) 08/19/2013   Benign hypertensive heart disease without heart failure 08/15/2013   PCP:  Ransom Byers,  MD Pharmacy:   [facility] [address], [place] - [address] [place] [address] Phone: [phone] Fax: [phone]     Social Drivers of Health (SDOH) Social History: SDOH Screenings   Food Insecurity: No Food Insecurity (09/15/2023)  Housing: Low Risk  (09/15/2023)  Transportation Needs: No Transportation Needs (09/15/2023)   Utilities: Not At Risk (09/15/2023)  Depression (PHQ2-9): Low Risk  (07/22/2022)  Physical Activity: Inactive (10/29/2017)  Social Connections: Moderately Integrated (09/15/2023)  Stress: No Stress Concern Present (10/29/2017)  Tobacco Use: Medium Risk (09/14/2023)   SDOH Interventions:     Readmission Risk Interventions    08/08/2022    3:21 PM  Readmission Risk Prevention Plan  Post Dischage Appt Complete  Medication Screening Complete  Transportation Screening Complete

## 2023-09-15 NOTE — Progress Notes (Signed)
 STROKE TEAM PROGRESS NOTE   SUBJECTIVE (INTERVAL HISTORY) Her husband is at the bedside.  Overall her condition is stable.  Patient still has right eye vision loss.  CT head and neck showed right ICA 50% stenosis, vascular surgery consulted.   OBJECTIVE Temp:  [98 F (36.7 C)-98.4 F (36.9 C)] 98.4 F (36.9 C) (04/15 1326) Pulse Rate:  [51-68] 62 (04/15 1326) Cardiac Rhythm: Normal sinus rhythm (04/15 1324) Resp:  [12-25] 18 (04/15 1326) BP: (120-148)/(60-100) 147/73 (04/15 1326) SpO2:  [89 %-100 %] 92 % (04/15 1326)  Recent Labs  Lab 09/14/23 1521  GLUCAP 92   Recent Labs  Lab 09/14/23 1520 09/14/23 1524 09/14/23 1925 09/15/23 1207  NA 140 140 142 141  K 4.9 5.2* 4.5 3.9  CL 104 106 107 111  CO2 26  --  25 21*  GLUCOSE 105* 102* 105* 91  BUN 18 25* 19 11  CREATININE 1.23* 1.30* 1.12* 0.96  CALCIUM 10.2  --  10.3 8.6*  MG  --   --   --  1.6*   Recent Labs  Lab 09/14/23 1520  AST 20  ALT 15  ALKPHOS 38  BILITOT 0.5  PROT 7.2  ALBUMIN 4.0   Recent Labs  Lab 09/14/23 1520 09/14/23 1524 09/15/23 1207  WBC 8.9  --  9.2  NEUTROABS 5.8  --   --   HGB 11.7* 12.2 10.0*  HCT 35.8* 36.0 30.8*  MCV 91.6  --  92.5  PLT 279  --  206   No results for input(s): "CKTOTAL", "CKMB", "CKMBINDEX", "TROPONINI" in the last 168 hours. Recent Labs    09/14/23 1520  LABPROT 13.4  INR 1.0   Recent Labs    09/14/23 1743  COLORURINE STRAW*  LABSPEC 1.006  PHURINE 5.0  GLUCOSEU NEGATIVE  HGBUR NEGATIVE  BILIRUBINUR NEGATIVE  KETONESUR NEGATIVE  PROTEINUR NEGATIVE  NITRITE NEGATIVE  LEUKOCYTESUR SMALL*       Component Value Date/Time   CHOL 130 09/15/2023 0641   TRIG 110 09/15/2023 0641   HDL 51 09/15/2023 0641   CHOLHDL 2.5 09/15/2023 0641   VLDL 22 09/15/2023 0641   LDLCALC 57 09/15/2023 0641   Lab Results  Component Value Date   HGBA1C 5.8 (H) 09/15/2023      Component Value Date/Time   LABOPIA NONE DETECTED 09/14/2023 1742   COCAINSCRNUR NONE  DETECTED 09/14/2023 1742   LABBENZ NONE DETECTED 09/14/2023 1742   AMPHETMU NONE DETECTED 09/14/2023 1742   THCU NONE DETECTED 09/14/2023 1742   LABBARB NONE DETECTED 09/14/2023 1742    Recent Labs  Lab 09/14/23 1520  ETH <10    I have personally reviewed the radiological images below and agree with the radiology interpretations.  VAS US CAROTID Result Date: 09/15/2023 Carotid Arterial Duplex Study Patient Name:  Jacqueline Burke  Date of Exam:   09/15/2023 Medical Rec #: [id number]       Accession #:    [id number] Date of Birth: 05/31/1936        Patient Gender: F Patient Age:   88 years Exam Location:  [facility] Procedure:      VAS US CAROTID Referring Phys: Carolynn Sayers --------------------------------------------------------------------------------  Indications:       Carotid artery disease, Visual disturbance and onset of right                    visual loss. Risk Factors:      Hypertension, hyperlipidemia, past history of smoking,  coronary artery disease. Other Factors:     GERD. Comparison Study:  09/15/23 - CT ANGIO HEAD NECK showed right ICA approximately                    50% stenosis. Left ICA no significant stenosis.                     08/06/2015 - Carotid - [facility] showed right ICA 50-69%                    stenosis, left ICA WNL. Performing Technologist: [place] Sturdivant-Jones RDMS, RVT  Examination Guidelines: A complete evaluation includes B-mode imaging, spectral Doppler, color Doppler, and power Doppler as needed of all accessible portions of each vessel. Bilateral testing is considered an integral part of a complete examination. Limited examinations for reoccurring indications may be performed as noted.  Right Carotid Findings: +----------+--------+--------+--------+------------------+--------+           PSV cm/sEDV cm/sStenosisPlaque DescriptionComments +----------+--------+--------+--------+------------------+--------+ CCA Prox  57      17                                          +----------+--------+--------+--------+------------------+--------+ CCA Distal62      15                                         +----------+--------+--------+--------+------------------+--------+ ICA Prox  190     44      40-59%  heterogenous               +----------+--------+--------+--------+------------------+--------+ ICA Mid   130     17                                         +----------+--------+--------+--------+------------------+--------+ ICA Distal72      27                                         +----------+--------+--------+--------+------------------+--------+ ECA       106     15                                         +----------+--------+--------+--------+------------------+--------+ +----------+--------+-------+----------------+-------------------+           PSV cm/sEDV cmsDescribe        Arm Pressure (mmHG) +----------+--------+-------+----------------+-------------------+ [id number]            Multiphasic, WNL                    +----------+--------+-------+----------------+-------------------+ +---------+--------+--+--------+--+---------+ VertebralPSV cm/s38EDV cm/s13Antegrade +---------+--------+--+--------+--+---------+  Left Carotid Findings: +----------+--------+--------+--------+------------------+--------+           PSV cm/sEDV cm/sStenosisPlaque DescriptionComments +----------+--------+--------+--------+------------------+--------+ CCA Prox  73      17                                         +----------+--------+--------+--------+------------------+--------+ CCA Distal98  19                                         +----------+--------+--------+--------+------------------+--------+ ICA Prox  93      17      1-39%   heterogenous               +----------+--------+--------+--------+------------------+--------+ ICA Distal119     15                                          +----------+--------+--------+--------+------------------+--------+ ECA       156                                                +----------+--------+--------+--------+------------------+--------+ +----------+--------+--------+----------------+-------------------+           PSV cm/sEDV cm/sDescribe        Arm Pressure (mmHG) +----------+--------+--------+----------------+-------------------+ [id number]     29      Multiphasic, WNL                    +----------+--------+--------+----------------+-------------------+ +---------+--------+--+--------+--+---------+ VertebralPSV cm/s47EDV cm/s14Antegrade +---------+--------+--+--------+--+---------+   Summary:   *See table(s) above for measurements and observations.  Electronically signed by Delaney Fearing on 09/15/2023 at 5:26:22 PM.    Final    ECHOCARDIOGRAM COMPLETE Result Date: 09/15/2023    ECHOCARDIOGRAM REPORT   Patient Name:   Jacqueline Burke Date of Exam: 09/15/2023 Medical Rec #:  [id number]      Height:       61.0 in Accession #:    [id number]     Weight:       126.0 lb Date of Birth:  September 24, 1935       BSA:          1.552 m Patient Age:    87 years       BP:           141/67 mmHg Patient Gender: F              HR:           67 bpm. Exam Location:  Inpatient Procedure: 2D Echo, Cardiac Doppler and Color Doppler (Both Spectral and Color            Flow Doppler were utilized during procedure). Indications:    CRAO (central retinal artery occlusion), right  History:        Patient has prior history of Echocardiogram examinations, most                 recent 04/23/2021. CAD and Previous Myocardial Infarction; Risk                 Factors:Hypertension.  Sonographer:    Astrid Blamer Referring Phys: [id number] DANIEL V THOMPSON IMPRESSIONS  1. Left ventricular ejection fraction, by estimation, is 60 to 65%. The left ventricle has normal function. The left ventricle has no regional wall motion abnormalities. There is mild left  ventricular hypertrophy. Left ventricular diastolic parameters are consistent with Grade I diastolic dysfunction (impaired relaxation).  2. Right ventricular systolic function is normal. The right ventricular size is normal.  3. The mitral valve is normal in structure. No evidence  of mitral valve regurgitation. No evidence of mitral stenosis.  4. The aortic valve is tricuspid. There is mild calcification of the aortic valve. Aortic valve regurgitation is not visualized. Aortic valve sclerosis is present, with no evidence of aortic valve stenosis. Aortic valve Vmax measures 1.04 m/s.  5. The inferior vena cava is normal in size with greater than 50% respiratory variability, suggesting right atrial pressure of 3 mmHg. FINDINGS  Left Ventricle: Left ventricular ejection fraction, by estimation, is 60 to 65%. The left ventricle has normal function. The left ventricle has no regional wall motion abnormalities. The left ventricular internal cavity size was normal in size. There is  mild left ventricular hypertrophy. Left ventricular diastolic parameters are consistent with Grade I diastolic dysfunction (impaired relaxation). Right Ventricle: The right ventricular size is normal. No increase in right ventricular wall thickness. Right ventricular systolic function is normal. Left Atrium: Left atrial size was normal in size. Right Atrium: Right atrial size was normal in size. Pericardium: There is no evidence of pericardial effusion. Mitral Valve: The mitral valve is normal in structure. No evidence of mitral valve regurgitation. No evidence of mitral valve stenosis. Tricuspid Valve: The tricuspid valve is normal in structure. Tricuspid valve regurgitation is not demonstrated. No evidence of tricuspid stenosis. Aortic Valve: The aortic valve is tricuspid. There is mild calcification of the aortic valve. Aortic valve regurgitation is not visualized. Aortic valve sclerosis is present, with no evidence of aortic valve stenosis.  Aortic valve mean gradient measures 3.0 mmHg. Aortic valve peak gradient measures 4.3 mmHg. Aortic valve area, by VTI measures 2.59 cm. Pulmonic Valve: The pulmonic valve was normal in structure. Pulmonic valve regurgitation is not visualized. No evidence of pulmonic stenosis. Aorta: The aortic root is normal in size and structure. Venous: The inferior vena cava is normal in size with greater than 50% respiratory variability, suggesting right atrial pressure of 3 mmHg. IAS/Shunts: No atrial level shunt detected by color flow Doppler.  LEFT VENTRICLE PLAX 2D LVIDd:         3.50 cm   Diastology LV PW:         1.10 cm   LV e' medial:    7.40 cm/s LV IVS:        1.20 cm   LV E/e' medial:  12.2 LVOT diam:     1.80 cm   LV e' lateral:   6.20 cm/s LV SV:         78        LV E/e' lateral: 14.5 LV SV Index:   50 LVOT Area:     2.54 cm  RIGHT VENTRICLE RV S prime:     12.70 cm/s TAPSE (M-mode): 2.1 cm LEFT ATRIUM             Index        RIGHT ATRIUM          Index LA Vol (A2C):   26.6 ml 17.14 ml/m  RA Area:     9.48 cm LA Vol (A4C):   41.3 ml 26.61 ml/m  RA Volume:   17.00 ml 10.95 ml/m LA Biplane Vol: 35.1 ml 22.62 ml/m  AORTIC VALVE AV Area (Vmax):    2.81 cm AV Area (Vmean):   2.47 cm AV Area (VTI):     2.59 cm AV Vmax:           104.00 cm/s AV Vmean:          83.200 cm/s AV VTI:  0.303 m AV Peak Grad:      4.3 mmHg AV Mean Grad:      3.0 mmHg LVOT Vmax:         115.00 cm/s LVOT Vmean:        80.700 cm/s LVOT VTI:          0.308 m LVOT/AV VTI ratio: 1.02  AORTA Ao Root diam: 2.80 cm MITRAL VALVE MV Area (PHT): 1.68 cm     SHUNTS MV Decel Time: 451 msec     Systemic VTI:  0.31 m MV E velocity: 90.10 cm/s   Systemic Diam: 1.80 cm MV A velocity: 131.00 cm/s MV E/A ratio:  0.69 Jacqueline Gathers MD Electronically signed by Jacqueline Gathers MD Signature Date/Time: 09/15/2023/2:11:37 PM    Final    CT ANGIO HEAD NECK W WO CM Result Date: 09/15/2023 CLINICAL DATA:  Stroke/TIA, vision loss in right eye. EXAM: CT  ANGIOGRAPHY HEAD AND NECK WITH AND WITHOUT CONTRAST TECHNIQUE: Multidetector CT imaging of the head and neck was performed using the standard protocol during bolus administration of intravenous contrast. Multiplanar CT image reconstructions and MIPs were obtained to evaluate the vascular anatomy. Carotid stenosis measurements (when applicable) are obtained utilizing NASCET criteria, using the distal internal carotid diameter as the denominator. RADIATION DOSE REDUCTION: This exam was performed according to the departmental dose-optimization program which includes automated exposure control, adjustment of the mA and/or kV according to patient size and/or use of iterative reconstruction technique. CONTRAST:  75mL OMNIPAQUE IOHEXOL 350 MG/ML SOLN COMPARISON:  MRA head 09/14/2023. FINDINGS: CT HEAD FINDINGS Brain: No acute intracranial hemorrhage. No CT evidence of acute infarct. Nonspecific hypoattenuation in the periventricular and subcortical white matter favored to reflect chronic microvascular ischemic changes. No edema, mass effect, or midline shift. The basilar cisterns are patent. Ventricles: Prominence of the ventricles suggestive of underlying parenchymal volume loss. Vascular: No hyperdense vessel. Intracranial atherosclerotic calcifications noted. Skull: No acute or aggressive finding. Sinuses/orbits: Postsurgical changes of both globes. Mild mucosal thickening in the ethmoid sinuses. Other: Mastoid air cells are clear. CTA NECK FINDINGS Aortic arch: Limited visualization of the aortic arch. There is mild atherosclerosis along the distal aortic arch. No evidence of aneurysm of the distal arch. Pulmonary arteries: As permitted by contrast timing, there are no filling defects in the visualized pulmonary arteries. Subclavian arteries: Patent bilaterally. Atherosclerosis involving the right brachiocephalic artery resulting in at least mild stenosis. Additional atherosclerosis of the proximal right subclavian  artery. Atherosclerosis of the proximal left subclavian artery without significant stenosis. Right carotid system: Patent from the origin to the skull base. Atherosclerosis at the carotid bifurcation resulting in approximately 50% stenosis at the origin of the right cervical ICA. Additional stenosis at the origin of the external carotid artery. Left carotid system: Patent from the origin to the skull base. Atherosclerosis at the carotid bifurcation without hemodynamically significant stenosis. Vertebral arteries: Codominant. No evidence of dissection or occlusion. Atherosclerosis at the origin of the right vertebral artery resulting in mild stenosis. There is atherosclerosis of the left subclavian artery just proximal to the vertebral artery origin without significant stenosis. Atherosclerosis involving the bilateral V4 segments with mild stenosis on the right and moderate stenosis on the left. Skeleton: No acute findings. Degenerative changes in the cervical spine. Disc space narrowing in the cervical spine is most pronounced at C6-7. Anterolisthesis of C4 on C5 likely related to facet degenerative changes. Other neck: The visualized airway is patent. No cervical lymphadenopathy. Upper chest: Emphysema in the visualized lung  apices. Review of the MIP images confirms the above findings CTA HEAD FINDINGS ANTERIOR CIRCULATION: The intracranial ICAs are patent bilaterally. Atherosclerosis of the bilateral carotid siphons. Mild stenosis of the right supraclinoid ICA. No significant stenosis, proximal occlusion, aneurysm, or vascular malformation. MCAs: Patent bilaterally. Mild narrowing of the proximal right M1 segment. There is severe stenosis of an M2 inferior division branch of the right MCA. Early branching of the left M1 segment. ACAs: The anterior cerebral arteries are patent bilaterally. POSTERIOR CIRCULATION: No significant stenosis, proximal occlusion, aneurysm, or vascular malformation. PCAs: The posterior  cerebral arteries are patent bilaterally. Pcomm: Not well visualized. SCAs: The superior cerebellar arteries are patent bilaterally. Basilar artery: Patent AICAs: Patent PICAs: Patent Vertebral arteries: The intracranial vertebral arteries are patent. Venous sinuses: As permitted by contrast timing, patent. Anatomic variants: None Review of the MIP images confirms the above findings IMPRESSION: No large vessel occlusion. No acute intracranial hemorrhage. The small right corona radiata infarct noted on MRI is not well appreciated on the current study. Atherosclerosis of the arteries in the neck. Approximately 50% stenosis at the origin of the right cervical ICA. Intracranial atherosclerosis as above. Moderate stenosis of the V4 segment left vertebral artery. Mild stenosis of the right supraclinoid ICA. Severe stenosis of a small M2 inferior division branch of the right MCA. Aortic Atherosclerosis (ICD10-I70.0) and Emphysema (ICD10-J43.9). Electronically Signed   By: Denny Flack M.D.   On: 09/15/2023 10:23   MR BRAIN WO CONTRAST Result Date: 09/14/2023 CLINICAL DATA:  Neuro deficit, acute, stroke suspected. Acute right eye vision loss. Central retinal artery occlusion. EXAM: MRI HEAD WITHOUT CONTRAST MRA HEAD WITHOUT CONTRAST TECHNIQUE: Multiplanar, multi-echo pulse sequences of the brain and surrounding structures were acquired without intravenous contrast. Angiographic images of the Circle of Willis were acquired using MRA technique without intravenous contrast. COMPARISON:  Head CT 10/21/2022 FINDINGS: MRI HEAD FINDINGS Brain: There is a punctate acute or early subacute infarct in the right corona radiata. No intracranial hemorrhage, mass, midline shift, or extra-axial fluid collection is identified. T2 hyperintensities in the cerebral white matter bilaterally are nonspecific but compatible with mild chronic small vessel ischemic disease. There is mild cerebral atrophy. Vascular: Major intracranial vascular  flow voids are preserved. Skull and upper cervical spine: Unremarkable bone marrow signal. Sinuses/Orbits: Bilateral cataract extraction. Paranasal sinuses and mastoid air cells are clear. Other: None. MRA HEAD FINDINGS Anterior circulation: The internal carotid arteries are widely patent from skull base to carotid termini. ACAs and MCAs are patent without evidence of a proximal branch occlusion or significant proximal stenosis. No aneurysm is identified. Posterior circulation: The included portions of the intracranial vertebral arteries are patent to the basilar with a moderate stenosis of the distal left V4 segment. The basilar artery is widely patent. Posterior communicating arteries are diminutive or absent. Both PCAs are patent without evidence of a significant proximal stenosis. No aneurysm is identified. Anatomic variants: None. IMPRESSION: 1. 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 2. Mild chronic small vessel ischemic disease. 3. Moderate left V4 stenosis.  Otherwise negative head MRA. Electronically Signed   By: Aundra Lee M.D.   On: 09/14/2023 21:15   MR ANGIO HEAD WO CONTRAST Result Date: 09/14/2023 CLINICAL DATA:  Neuro deficit, acute, stroke suspected. Acute right eye vision loss. Central retinal artery occlusion. EXAM: MRI HEAD WITHOUT CONTRAST MRA HEAD WITHOUT CONTRAST TECHNIQUE: Multiplanar, multi-echo pulse sequences of the brain and surrounding structures were acquired without intravenous contrast. Angiographic images of the Circle of Willis  were acquired using MRA technique without intravenous contrast. COMPARISON:  Head CT 10/21/2022 FINDINGS: MRI HEAD FINDINGS Brain: There is a punctate acute or early subacute infarct in the right corona radiata. No intracranial hemorrhage, mass, midline shift, or extra-axial fluid collection is identified. T2 hyperintensities in the cerebral white matter bilaterally are nonspecific but compatible with mild chronic small vessel  ischemic disease. There is mild cerebral atrophy. Vascular: Major intracranial vascular flow voids are preserved. Skull and upper cervical spine: Unremarkable bone marrow signal. Sinuses/Orbits: Bilateral cataract extraction. Paranasal sinuses and mastoid air cells are clear. Other: None. MRA HEAD FINDINGS Anterior circulation: The internal carotid arteries are widely patent from skull base to carotid termini. ACAs and MCAs are patent without evidence of a proximal branch occlusion or significant proximal stenosis. No aneurysm is identified. Posterior circulation: The included portions of the intracranial vertebral arteries are patent to the basilar with a moderate stenosis of the distal left V4 segment. The basilar artery is widely patent. Posterior communicating arteries are diminutive or absent. Both PCAs are patent without evidence of a significant proximal stenosis. No aneurysm is identified. Anatomic variants: None. IMPRESSION: 1. 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 2. Mild chronic small vessel ischemic disease. 3. Moderate left V4 stenosis.  Otherwise negative head MRA. Electronically Signed   By: Sebastian Ache M.D.   On: 09/14/2023 21:15     PHYSICAL EXAM  Temp:  [98 F (36.7 C)-98.4 F (36.9 C)] 98.4 F (36.9 C) (04/15 1326) Pulse Rate:  [51-68] 62 (04/15 1326) Resp:  [12-25] 18 (04/15 1326) BP: (120-148)/(60-100) 147/73 (04/15 1326) SpO2:  [89 %-100 %] 92 % (04/15 1326)  General - Well nourished, well developed, in no apparent distress.  Ophthalmologic - fundi not visualized due to noncooperation.  Cardiovascular - Regular rhythm and rate.  Mental Status -  Level of arousal and orientation to time, place, and person were intact. Language including expression, naming, repetition, comprehension was assessed and found intact. Fund of Knowledge was assessed and was intact.  Cranial Nerves II - XII - II - Visual field intact OS.  However, vision loss as  OD with only light perception III, IV, VI - Extraocular movements intact. V - Facial sensation intact bilaterally. VII - Facial movement intact bilaterally. VIII - Hearing & vestibular intact bilaterally. X - Palate elevates symmetrically. XI - Chin turning & shoulder shrug intact bilaterally. XII - Tongue protrusion intact.  Motor Strength - The patient's strength was normal in all extremities and pronator drift was absent.  Bulk was normal and fasciculations were absent.   Motor Tone - Muscle tone was assessed at the neck and appendages and was normal.  Reflexes - The patient's reflexes were symmetrical in all extremities and she had no pathological reflexes.  Sensory - Light touch, temperature/pinprick were assessed and were symmetrical.    Coordination - The patient had normal movements in the hands and feet with no ataxia or dysmetria.  Tremor was absent.  Gait and Station - deferred.   ASSESSMENT/PLAN Ms. DESIRAYE ROLFSON is a 88 y.o. female with history of hypertension, hyperlipidemia, CAD, CKD 3, COPD admitted for right eye vision loss. No TNK given due to outside window.    Stroke: Punctate right CR infarct, artery to artery embolic secondary to right ICA stenosis MRI right CR punctate infarct MRA head moderate left V4 stenosis CTA head and neck about 50% stenosis at right ICA origin, moderate left V4 stenosis, severe stenosis right M2 inferior branch  Carotid Doppler right ICA 40 to 59% stenosis 2D Echo EF 60 to 65% LDL 57 HgbA1c 5.8 UDS negative ESR and CRP negative Lovenox for VTE prophylaxis clopidogrel 75 mg daily prior to admission, now on aspirin 81 mg daily and clopidogrel 75 mg daily.  Further management per [facility] Patient counseled to be compliant with her antithrombotic medications Ongoing aggressive stroke risk factor management Therapy recommendations: Outpatient PT and OT Disposition: Pending  Right CRAO Acute onset right sided vision loss, painless CT head  and neck and carotid Doppler concerning for right ICA origin stenosis Likely the cause of vision loss Vascular surgery consulted  Carotid stenosis CTA head and neck about 50% stenosis at right ICA origin, moderate left V4 stenosis, severe stenosis right M2 inferior branch Carotid Doppler right ICA 40 to 59% stenosis Given right CR infarct and right CRAO, vascular surgery consulted Plan for right TCAR 4/17  Hypertension Stable Avoid low BP On home metoprolol 12.5 twice daily Long term BP goal normotensive  Hyperlipidemia Home meds: Lipitor 80 LDL 57, goal < 70 Now on Lipitor 80  Continue statin at discharge  Other Stroke Risk Factors Advanced age  Other Active Problems COPD CKD 3A, creatinine 1.30--1.12  Hospital day # 0  Neurology will sign off. Please call with questions. Pt will follow up with stroke clinic NP at [facility] in about 4 weeks. Thanks for the consult.   Consuelo Denmark, MD PhD Stroke Neurology 09/15/2023 7:16 PM    To contact Stroke Continuity provider, please refer to [email or web address]. After hours, contact General Neurology

## 2023-09-15 NOTE — Evaluation (Signed)
 Physical Therapy Evaluation Patient Details Name: Jacqueline Burke MRN: [id number] DOB: [birth year] Today's Date: 09/15/2023  History of Present Illness  88 y.o. female who presented to the hospital with sudden loss of vision in the right eye that has not returned.  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  CTA head neck revealed right carotid system patent from origin to skull base and 50% stenosis at the origin of the right cervical ICA as well as stenosis at the ECA. PMH: HTN, CAD with hx of NSTEMI and hx of PCI of RCA with DES, hx of E coli bacteremia in 2019.  Clinical Impression  Pt admitted with above diagnosis. Pt able to ambulate into hallway with CGA and cues for safety. Pt without overt LOB and scored 18/24 on DGI suggesting risk of falls without device. Recommend pt use cane in left hand initially on d/c until she gets used to scanning visual environment and most likely can do her post acute rehab at Outpt PT.  Will follow acutely.   Pt currently with functional limitations due to the deficits listed below (see PT Problem List). Pt will benefit from acute skilled PT to increase their independence and safety with mobility to allow discharge.           If plan is discharge home, recommend the following: A little help with walking and/or transfers;A little help with bathing/dressing/bathroom;Help with stairs or ramp for entrance;Assist for transportation;Assistance with cooking/housework   Can travel by private vehicle        Equipment Recommendations None recommended by PT  Recommendations for Other Services       Functional Status Assessment Patient has had a recent decline in their functional status and demonstrates the ability to make significant improvements in function in a reasonable and predictable amount of time.     Precautions /  Restrictions Precautions Precautions: None Restrictions Weight Bearing Restrictions Per Provider Order: No      Mobility  Bed Mobility Overal bed mobility: Needs Assistance Bed Mobility: Supine to Sit     Supine to sit: Contact guard     General bed mobility comments: Needed a little steadying due to stretcher height    Transfers Overall transfer level: Needs assistance Equipment used: None Transfers: Sit to/from Stand Sit to Stand: Contact guard assist           General transfer comment: Needed CGA due to stretcher    Ambulation/Gait Ambulation/Gait assistance: Contact guard assist Gait Distance (Feet): 150 Feet Assistive device: None Gait Pattern/deviations: Step-through pattern, Drifts right/left, Narrow base of support   Gait velocity interpretation: <1.31 ft/sec, indicative of household ambulator   General Gait Details: Pt was able to withstand challenges to balance but needing CGA at times as pt unsure of steps at times.  Pt overall no significant LOB.  Stairs            Wheelchair Mobility     Tilt Bed    Modified Rankin (Stroke Patients Only) Modified Rankin (Stroke Patients Only) Pre-Morbid Rankin Score: No symptoms Modified Rankin: Moderately severe disability     Balance                                 Standardized Balance Assessment Standardized Balance Assessment : Dynamic Gait Index   Dynamic Gait Index  Level Surface: Normal Change in Gait Speed: Mild Impairment Gait with Horizontal Head Turns: Mild Impairment Gait with Vertical Head Turns: Mild Impairment Gait and Pivot Turn: Normal Step Over Obstacle: Mild Impairment Step Around Obstacles: Normal Steps: Moderate Impairment Total Score: 18       Pertinent Vitals/Pain Pain Assessment Pain Assessment: No/denies pain    Home Living Family/patient expects to be discharged to:: Private residence Living Arrangements: Spouse/significant other Available Help  at Discharge: Family;Available 24 hours/day Type of Home: House Home Access: Stairs to enter Entrance Stairs-Rails: Left Entrance Stairs-Number of Steps: 3   Home Layout: One level Home Equipment: Agricultural consultant (2 wheels);Shower seat;BSC/3in1;Cane - single point      Prior Function Prior Level of Function : Independent/Modified Independent                     Extremity/Trunk Assessment   Upper Extremity Assessment Upper Extremity Assessment: Defer to OT evaluation    Lower Extremity Assessment Lower Extremity Assessment: Generalized weakness    Cervical / Trunk Assessment Cervical / Trunk Assessment: Normal  Communication   Communication Communication: No apparent difficulties    Cognition Arousal: Alert Behavior During Therapy: WFL for tasks assessed/performed   PT - Cognitive impairments: No apparent impairments                         Following commands: Intact       Cueing       General Comments General comments (skin integrity, edema, etc.): 98% EOB on RA, 53 bpm, supine 159/82, sitting 116/86, standing 129/63.    Exercises     Assessment/Plan    PT Assessment Patient needs continued PT services  PT Problem List Decreased activity tolerance;Decreased balance;Decreased mobility;Decreased knowledge of use of DME;Decreased safety awareness;Decreased knowledge of precautions;Cardiopulmonary status limiting activity       PT Treatment Interventions DME instruction;Gait training;Functional mobility training;Therapeutic activities;Therapeutic exercise;Balance training;Stair training;Patient/family education    PT Goals (Current goals can be found in the Care Plan section)  Acute Rehab PT Goals Patient Stated Goal: to go home PT Goal Formulation: With patient Time For Goal Achievement: 09/29/23 Potential to Achieve Goals: Good    Frequency Min 3X/week     Co-evaluation               AM-PAC PT "6 Clicks" Mobility  Outcome  Measure Help needed turning from your back to your side while in a flat bed without using bedrails?: A Little Help needed moving from lying on your back to sitting on the side of a flat bed without using bedrails?: A Little Help needed moving to and from a bed to a chair (including a wheelchair)?: A Little Help needed standing up from a chair using your arms (e.g., wheelchair or bedside chair)?: A Little Help needed to walk in hospital room?: A Little Help needed climbing 3-5 steps with a railing? : A Lot 6 Click Score: 17    End of Session Equipment Utilized During Treatment: Gait belt Activity Tolerance: Patient limited by fatigue Patient left: with call bell/phone within reach;with family/visitor present (on stretcher) Nurse Communication: Mobility status PT Visit Diagnosis: Unsteadiness on feet (R26.81);Muscle weakness (generalized) (M62.81)    Time: 5638-7564 PT Time Calculation (min) (ACUTE ONLY): 30 min   Charges:   PT Evaluation $PT Eval Moderate Complexity: 1 Mod PT Treatments $Gait Training: 8-22 mins PT General Charges $$ ACUTE PT VISIT: 1 Visit  [facility] M,PT Acute Rehab Services [phone]   Florencia Hunter 09/15/2023, 12:22 PM

## 2023-09-15 NOTE — Progress Notes (Signed)
 PROGRESS NOTE    Jacqueline Burke  NWG:[id number] DOB: [birth year] DOA: 09/14/2023 PCP: Shon Hale, MD    Chief Complaint  Patient presents with   Loss of Vision    Brief Narrative:  Patient pleasant 88 year old female history of CAD, hypertension, hyperlipidemia, rheumatoid arthritis presented to the ED with sudden onset right visual field loss.  Patient seen by ophthalmologist and noted to have central retinal artery occlusion and sent to the ED for further evaluation.  Patient seen in the ED MRI/MRA head done concerning for acute CVA.  CT angiogram head and neck done with concerns for 50% stenosis at the origin of the right cervical ICA as well as stenosis at the ECA.  Neurology consulted and following.  Neurology consulted vascular for further evaluation due to concern for symptomatic carotid artery stenosis.   Assessment & Plan:   Principal Problem:   CRAO (central retinal artery occlusion), right Active Problems:   Acute CVA (cerebrovascular accident) (HCC)   Benign hypertensive heart disease without heart failure   CAD (coronary artery disease), native coronary artery   Hyperlipidemia LDL goal <70   Essential hypertension   COPD (chronic obstructive pulmonary disease) (HCC)   RA (rheumatoid arthritis) (HCC)   Hyperkalemia   AKI (acute kidney injury) (HCC)   Symptomatic carotid artery stenosis   Hypomagnesemia  #1 Central retinal artery occlusion secondary to acute CVA likely secondary to symptomatic carotid artery stenosis - Patient admitted with sudden onset right visual field loss. - Patient seen by ophthalmologist in the outpatient setting and noted to have central retinal artery occlusion and sent to the ED for embolic workup/stroke workup. - Patient underwent MRI/MRA head which showed punctuate acute/early subacute infarct in the right corona radiator.  Mild chronic small vessel ischemic disease.  Moderate left V4 stenosis otherwise negative head MRA. - CT  angiogram head and neck done with no LVO.  No acute intracranial hemorrhage.  Small right corona radiator infarct noted on MRI is not well-appreciated on current study.  Atherosclerosis of the arteries in the neck.  Approximately 50% stenosis at the origin of right cervical ICA.  Intracranial atherosclerosis.  Moderate stenosis of V4 segment left vertebral artery.  Mild stenosis of right supraclinoid ICA.  Severe stenosis of small M2 inferior division branch of right MCA. - Carotid Dopplers pending. - 2D echo with a EF of 60 to 65%,NWMA, mild LVH, grade 1 DD, no embolic source noted. -Fasting lipid panel with LDL of 57. -Hemoglobin A1c 5.8. -CRP noted at 0.6, sed rate at 13. - Patient was seen in consultation by neurology who are following and consulted with vascular for further evaluation and management due to concerns for symptomatic carotid artery stenosis. - Patient seen in consultation by vascular surgery due to concerns for symptomatic carotid stenosis and patient for TCAR tentatively planned 09/17/2023 per vascular surgery. - Continue aspirin, Plavix, Lipitor. - Neurology, vascular surgery following and appreciate their input and recommendations.  2.  Hyperkalemia -Likely lab error. - Repeat BMET with a potassium of 3.9.  3.  AKI -Likely secondary to prerenal azotemia secondary to dehydration. - Improved with IV fluids.  4.  Hypertension -Continue home regimen of metoprolol, Norvasc.  5.  Hyperlipidemia -Fasting lipid panel with LDL of 57. - Continue home regimen Lipitor.  6.  Hypomagnesemia -Magnesium of 1.6. - Magnesium sulfate 4 g IV x 1. - Repeat labs in the AM.  7.  History of rheumatoid arthritis -Continue home regimen Neurontin. - Outpatient follow-up with primary  rheumatologist.   DVT prophylaxis: Lovenox Code Status: Full Family Communication: Updated patient and husband at bedside. Disposition: Likely home when clinically improved, cleared by neurology and  vascular surgery.  Status is: Observation The patient remains OBS appropriate and will d/c before 2 midnights.   Consultants:  Neurology: Dr.Bhagat Vascular surgery  Procedures: MRI brain MRA head: 09/14/2023 CT angiogram head and neck 09/15/2023 2D echo 09/15/2023 Carotid ultrasound 09/15/2023   Antimicrobials:  Anti-infectives (From admission, onward)    None         Subjective: Patient laying on gurney in ED.  States still with vision loss in the right eye.  Denies any chest pain or shortness of breath.  No abdominal pain.  Husband at bedside.  Objective: Vitals:   09/15/23 0900 09/15/23 1107 09/15/23 1220 09/15/23 1326  BP: (!) 148/79  138/60 (!) 147/73  Pulse: 63  (!) 53 62  Resp: (!) 25  15 18   Temp:  98.1 F (36.7 C)  98.4 F (36.9 C)  TempSrc:    Oral  SpO2: 99%  91% 92%  Weight:      Height:        Intake/Output Summary (Last 24 hours) at 09/15/2023 1639 Last data filed at 09/15/2023 0630 Gross per 24 hour  Intake 1071.67 ml  Output --  Net 1071.67 ml   Filed Weights   09/14/23 1516  Weight: 57.2 kg    Examination:  General exam: NAD Respiratory system: Clear to auscultation. Respiratory effort normal. Cardiovascular system: S1 & S2 heard, RRR. No JVD, murmurs, rubs, gallops or clicks. No pedal edema. Gastrointestinal system: Abdomen is nondistended, soft and nontender. No organomegaly or masses felt. Normal bowel sounds heard. Central nervous system: Alert and oriented.  Right eye visual loss.  Moving extremities spontaneously.  No focal neurological deficits. Extremities: Symmetric 5 x 5 power. Skin: No rashes, lesions or ulcers Psychiatry: Judgement and insight appear normal. Mood & affect appropriate.     Data Reviewed: I have personally reviewed following labs and imaging studies  CBC: Recent Labs  Lab 09/14/23 1520 09/14/23 1524 09/15/23 1207  WBC 8.9  --  9.2  NEUTROABS 5.8  --   --   HGB 11.7* 12.2 10.0*  HCT 35.8* 36.0 30.8*   MCV 91.6  --  92.5  PLT 279  --  206    Basic Metabolic Panel: Recent Labs  Lab 09/14/23 1520 09/14/23 1524 09/14/23 1925 09/15/23 1207  NA 140 140 142 141  K 4.9 5.2* 4.5 3.9  CL 104 106 107 111  CO2 26  --  25 21*  GLUCOSE 105* 102* 105* 91  BUN 18 25* 19 11  CREATININE 1.23* 1.30* 1.12* 0.96  CALCIUM 10.2  --  10.3 8.6*  MG  --   --   --  1.6*    GFR: Estimated Creatinine Clearance: 32.3 mL/min (by C-G formula based on SCr of 0.96 mg/dL).  Liver Function Tests: Recent Labs  Lab 09/14/23 1520  AST 20  ALT 15  ALKPHOS 38  BILITOT 0.5  PROT 7.2  ALBUMIN 4.0    CBG: Recent Labs  Lab 09/14/23 1521  GLUCAP 92     No results found for this or any previous visit (from the past 240 hours).       Radiology Studies: ECHOCARDIOGRAM COMPLETE Result Date: 09/15/2023    ECHOCARDIOGRAM REPORT   Patient Name:   MARTINA BRODBECK Date of Exam: 09/15/2023 Medical Rec #:  [id number]  Height:       61.0 in Accession #:    [id number]     Weight:       126.0 lb Date of Birth:  1935-11-17       BSA:          1.552 m Patient Age:    87 years       BP:           141/67 mmHg Patient Gender: F              HR:           67 bpm. Exam Location:  Inpatient Procedure: 2D Echo, Cardiac Doppler and Color Doppler (Both Spectral and Color            Flow Doppler were utilized during procedure). Indications:    CRAO (central retinal artery occlusion), right  History:        Patient has prior history of Echocardiogram examinations, most                 recent 04/23/2021. CAD and Previous Myocardial Infarction; Risk                 Factors:Hypertension.  Sonographer:    Astrid Blamer Referring Phys: [id number] Fatou Dunnigan V Nayquan Evinger IMPRESSIONS  1. Left ventricular ejection fraction, by estimation, is 60 to 65%. The left ventricle has normal function. The left ventricle has no regional wall motion abnormalities. There is mild left ventricular hypertrophy. Left ventricular diastolic parameters are consistent  with Grade I diastolic dysfunction (impaired relaxation).  2. Right ventricular systolic function is normal. The right ventricular size is normal.  3. The mitral valve is normal in structure. No evidence of mitral valve regurgitation. No evidence of mitral stenosis.  4. The aortic valve is tricuspid. There is mild calcification of the aortic valve. Aortic valve regurgitation is not visualized. Aortic valve sclerosis is present, with no evidence of aortic valve stenosis. Aortic valve Vmax measures 1.04 m/s.  5. The inferior vena cava is normal in size with greater than 50% respiratory variability, suggesting right atrial pressure of 3 mmHg. FINDINGS  Left Ventricle: Left ventricular ejection fraction, by estimation, is 60 to 65%. The left ventricle has normal function. The left ventricle has no regional wall motion abnormalities. The left ventricular internal cavity size was normal in size. There is  mild left ventricular hypertrophy. Left ventricular diastolic parameters are consistent with Grade I diastolic dysfunction (impaired relaxation). Right Ventricle: The right ventricular size is normal. No increase in right ventricular wall thickness. Right ventricular systolic function is normal. Left Atrium: Left atrial size was normal in size. Right Atrium: Right atrial size was normal in size. Pericardium: There is no evidence of pericardial effusion. Mitral Valve: The mitral valve is normal in structure. No evidence of mitral valve regurgitation. No evidence of mitral valve stenosis. Tricuspid Valve: The tricuspid valve is normal in structure. Tricuspid valve regurgitation is not demonstrated. No evidence of tricuspid stenosis. Aortic Valve: The aortic valve is tricuspid. There is mild calcification of the aortic valve. Aortic valve regurgitation is not visualized. Aortic valve sclerosis is present, with no evidence of aortic valve stenosis. Aortic valve mean gradient measures 3.0 mmHg. Aortic valve peak gradient  measures 4.3 mmHg. Aortic valve area, by VTI measures 2.59 cm. Pulmonic Valve: The pulmonic valve was normal in structure. Pulmonic valve regurgitation is not visualized. No evidence of pulmonic stenosis. Aorta: The aortic root is normal in size and structure. Venous: The inferior  vena cava is normal in size with greater than 50% respiratory variability, suggesting right atrial pressure of 3 mmHg. IAS/Shunts: No atrial level shunt detected by color flow Doppler.  LEFT VENTRICLE PLAX 2D LVIDd:         3.50 cm   Diastology LV PW:         1.10 cm   LV e' medial:    7.40 cm/s LV IVS:        1.20 cm   LV E/e' medial:  12.2 LVOT diam:     1.80 cm   LV e' lateral:   6.20 cm/s LV SV:         78        LV E/e' lateral: 14.5 LV SV Index:   50 LVOT Area:     2.54 cm  RIGHT VENTRICLE RV S prime:     12.70 cm/s TAPSE (M-mode): 2.1 cm LEFT ATRIUM             Index        RIGHT ATRIUM          Index LA Vol (A2C):   26.6 ml 17.14 ml/m  RA Area:     9.48 cm LA Vol (A4C):   41.3 ml 26.61 ml/m  RA Volume:   17.00 ml 10.95 ml/m LA Biplane Vol: 35.1 ml 22.62 ml/m  AORTIC VALVE AV Area (Vmax):    2.81 cm AV Area (Vmean):   2.47 cm AV Area (VTI):     2.59 cm AV Vmax:           104.00 cm/s AV Vmean:          83.200 cm/s AV VTI:            0.303 m AV Peak Grad:      4.3 mmHg AV Mean Grad:      3.0 mmHg LVOT Vmax:         115.00 cm/s LVOT Vmean:        80.700 cm/s LVOT VTI:          0.308 m LVOT/AV VTI ratio: 1.02  AORTA Ao Root diam: 2.80 cm MITRAL VALVE MV Area (PHT): 1.68 cm     SHUNTS MV Decel Time: 451 msec     Systemic VTI:  0.31 m MV E velocity: 90.10 cm/s   Systemic Diam: 1.80 cm MV A velocity: 131.00 cm/s MV E/A ratio:  0.69 Donato Schultz MD Electronically signed by Donato Schultz MD Signature Date/Time: 09/15/2023/2:11:37 PM    Final    VAS US CAROTID Result Date: 09/15/2023 Carotid Arterial Duplex Study Patient Name:  Sheli KANEISHA ELLENBERGER  Date of Exam:   09/15/2023 Medical Rec #: [id number]       Accession #:    [id number] Date  of Birth: 02-Jan-1936        Patient Gender: F Patient Age:   63 years Exam Location:  [facility] Procedure:      VAS US CAROTID Referring Phys: Carolynn Sayers --------------------------------------------------------------------------------  Indications:       Carotid artery disease, Visual disturbance and onset of right                    visual loss. Risk Factors:      Hypertension, hyperlipidemia, past history of smoking,                    coronary artery disease. Other Factors:     GERD. Comparison Study:  09/15/23 - CT ANGIO HEAD NECK showed right ICA approximately                    50% stenosis. Left ICA no significant stenosis.                     08/06/2015 - Carotid - [facility] showed right ICA 50-69%                    stenosis, left ICA WNL. Performing Technologist:  Sink Sturdivant-Jones RDMS, RVT  Examination Guidelines: A complete evaluation includes B-mode imaging, spectral Doppler, color Doppler, and power Doppler as needed of all accessible portions of each vessel. Bilateral testing is considered an integral part of a complete examination. Limited examinations for reoccurring indications may be performed as noted.  Right Carotid Findings: +----------+--------+--------+--------+------------------+--------+           PSV cm/sEDV cm/sStenosisPlaque DescriptionComments +----------+--------+--------+--------+------------------+--------+ CCA Prox  57      17                                         +----------+--------+--------+--------+------------------+--------+ CCA Distal62      15                                         +----------+--------+--------+--------+------------------+--------+ ICA Prox  190     44      40-59%  heterogenous               +----------+--------+--------+--------+------------------+--------+ ICA Mid   130     17                                         +----------+--------+--------+--------+------------------+--------+ ICA Distal72       27                                         +----------+--------+--------+--------+------------------+--------+ ECA       106     15                                         +----------+--------+--------+--------+------------------+--------+ +----------+--------+-------+----------------+-------------------+           PSV cm/sEDV cmsDescribe        Arm Pressure (mmHG) +----------+--------+-------+----------------+-------------------+ [id number]            Multiphasic, WNL                    +----------+--------+-------+----------------+-------------------+ +---------+--------+--+--------+--+---------+ VertebralPSV cm/s38EDV cm/s13Antegrade +---------+--------+--+--------+--+---------+  Left Carotid Findings: +----------+--------+--------+--------+------------------+--------+           PSV cm/sEDV cm/sStenosisPlaque DescriptionComments +----------+--------+--------+--------+------------------+--------+ CCA Prox  73      17                                         +----------+--------+--------+--------+------------------+--------+ CCA Distal98      19                                         +----------+--------+--------+--------+------------------+--------+  ICA Prox  93      17      1-39%   heterogenous               +----------+--------+--------+--------+------------------+--------+ ICA Distal119     15                                         +----------+--------+--------+--------+------------------+--------+ ECA       156                                                +----------+--------+--------+--------+------------------+--------+ +----------+--------+--------+----------------+-------------------+           PSV cm/sEDV cm/sDescribe        Arm Pressure (mmHG) +----------+--------+--------+----------------+-------------------+ [id number]     29      Multiphasic, WNL                     +----------+--------+--------+----------------+-------------------+ +---------+--------+--+--------+--+---------+ VertebralPSV cm/s47EDV cm/s14Antegrade +---------+--------+--+--------+--+---------+      Preliminary    CT ANGIO HEAD NECK W WO CM Result Date: 09/15/2023 CLINICAL DATA:  Stroke/TIA, vision loss in right eye. EXAM: CT ANGIOGRAPHY HEAD AND NECK WITH AND WITHOUT CONTRAST TECHNIQUE: Multidetector CT imaging of the head and neck was performed using the standard protocol during bolus administration of intravenous contrast. Multiplanar CT image reconstructions and MIPs were obtained to evaluate the vascular anatomy. Carotid stenosis measurements (when applicable) are obtained utilizing NASCET criteria, using the distal internal carotid diameter as the denominator. RADIATION DOSE REDUCTION: This exam was performed according to the departmental dose-optimization program which includes automated exposure control, adjustment of the mA and/or kV according to patient size and/or use of iterative reconstruction technique. CONTRAST:  75mL OMNIPAQUE IOHEXOL 350 MG/ML SOLN COMPARISON:  MRA head 09/14/2023. FINDINGS: CT HEAD FINDINGS Brain: No acute intracranial hemorrhage. No CT evidence of acute infarct. Nonspecific hypoattenuation in the periventricular and subcortical white matter favored to reflect chronic microvascular ischemic changes. No edema, mass effect, or midline shift. The basilar cisterns are patent. Ventricles: Prominence of the ventricles suggestive of underlying parenchymal volume loss. Vascular: No hyperdense vessel. Intracranial atherosclerotic calcifications noted. Skull: No acute or aggressive finding. Sinuses/orbits: Postsurgical changes of both globes. Mild mucosal thickening in the ethmoid sinuses. Other: Mastoid air cells are clear. CTA NECK FINDINGS Aortic arch: Limited visualization of the aortic arch. There is mild atherosclerosis along the distal aortic arch. No evidence of  aneurysm of the distal arch. Pulmonary arteries: As permitted by contrast timing, there are no filling defects in the visualized pulmonary arteries. Subclavian arteries: Patent bilaterally. Atherosclerosis involving the right brachiocephalic artery resulting in at least mild stenosis. Additional atherosclerosis of the proximal right subclavian artery. Atherosclerosis of the proximal left subclavian artery without significant stenosis. Right carotid system: Patent from the origin to the skull base. Atherosclerosis at the carotid bifurcation resulting in approximately 50% stenosis at the origin of the right cervical ICA. Additional stenosis at the origin of the external carotid artery. Left carotid system: Patent from the origin to the skull base. Atherosclerosis at the carotid bifurcation without hemodynamically significant stenosis. Vertebral arteries: Codominant. No evidence of dissection or occlusion. Atherosclerosis at the origin of the right vertebral artery resulting in mild stenosis. There is atherosclerosis of the left subclavian artery just proximal to the  vertebral artery origin without significant stenosis. Atherosclerosis involving the bilateral V4 segments with mild stenosis on the right and moderate stenosis on the left. Skeleton: No acute findings. Degenerative changes in the cervical spine. Disc space narrowing in the cervical spine is most pronounced at C6-7. Anterolisthesis of C4 on C5 likely related to facet degenerative changes. Other neck: The visualized airway is patent. No cervical lymphadenopathy. Upper chest: Emphysema in the visualized lung apices. Review of the MIP images confirms the above findings CTA HEAD FINDINGS ANTERIOR CIRCULATION: The intracranial ICAs are patent bilaterally. Atherosclerosis of the bilateral carotid siphons. Mild stenosis of the right supraclinoid ICA. No significant stenosis, proximal occlusion, aneurysm, or vascular malformation. MCAs: Patent bilaterally. Mild  narrowing of the proximal right M1 segment. There is severe stenosis of an M2 inferior division branch of the right MCA. Early branching of the left M1 segment. ACAs: The anterior cerebral arteries are patent bilaterally. POSTERIOR CIRCULATION: No significant stenosis, proximal occlusion, aneurysm, or vascular malformation. PCAs: The posterior cerebral arteries are patent bilaterally. Pcomm: Not well visualized. SCAs: The superior cerebellar arteries are patent bilaterally. Basilar artery: Patent AICAs: Patent PICAs: Patent Vertebral arteries: The intracranial vertebral arteries are patent. Venous sinuses: As permitted by contrast timing, patent. Anatomic variants: None Review of the MIP images confirms the above findings IMPRESSION: No large vessel occlusion. No acute intracranial hemorrhage. The small right corona radiata infarct noted on MRI is not well appreciated on the current study. Atherosclerosis of the arteries in the neck. Approximately 50% stenosis at the origin of the right cervical ICA. Intracranial atherosclerosis as above. Moderate stenosis of the V4 segment left vertebral artery. Mild stenosis of the right supraclinoid ICA. Severe stenosis of a small M2 inferior division branch of the right MCA. Aortic Atherosclerosis (ICD10-I70.0) and Emphysema (ICD10-J43.9). Electronically Signed   By: Denny Flack M.D.   On: 09/15/2023 10:23   MR BRAIN WO CONTRAST Result Date: 09/14/2023 CLINICAL DATA:  Neuro deficit, acute, stroke suspected. Acute right eye vision loss. Central retinal artery occlusion. EXAM: MRI HEAD WITHOUT CONTRAST MRA HEAD WITHOUT CONTRAST TECHNIQUE: Multiplanar, multi-echo pulse sequences of the brain and surrounding structures were acquired without intravenous contrast. Angiographic images of the Circle of Willis were acquired using MRA technique without intravenous contrast. COMPARISON:  Head CT 10/21/2022 FINDINGS: MRI HEAD FINDINGS Brain: There is a punctate acute or early subacute  infarct in the right corona radiata. No intracranial hemorrhage, mass, midline shift, or extra-axial fluid collection is identified. T2 hyperintensities in the cerebral white matter bilaterally are nonspecific but compatible with mild chronic small vessel ischemic disease. There is mild cerebral atrophy. Vascular: Major intracranial vascular flow voids are preserved. Skull and upper cervical spine: Unremarkable bone marrow signal. Sinuses/Orbits: Bilateral cataract extraction. Paranasal sinuses and mastoid air cells are clear. Other: None. MRA HEAD FINDINGS Anterior circulation: The internal carotid arteries are widely patent from skull base to carotid termini. ACAs and MCAs are patent without evidence of a proximal branch occlusion or significant proximal stenosis. No aneurysm is identified. Posterior circulation: The included portions of the intracranial vertebral arteries are patent to the basilar with a moderate stenosis of the distal left V4 segment. The basilar artery is widely patent. Posterior communicating arteries are diminutive or absent. Both PCAs are patent without evidence of a significant proximal stenosis. No aneurysm is identified. Anatomic variants: None. IMPRESSION: 1. 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 2. Mild chronic small vessel ischemic disease. 3. Moderate left V4 stenosis.  Otherwise negative head  MRA. Electronically Signed   By: Aundra Lee M.D.   On: 09/14/2023 21:15   MR ANGIO HEAD WO CONTRAST Result Date: 09/14/2023 CLINICAL DATA:  Neuro deficit, acute, stroke suspected. Acute right eye vision loss. Central retinal artery occlusion. EXAM: MRI HEAD WITHOUT CONTRAST MRA HEAD WITHOUT CONTRAST TECHNIQUE: Multiplanar, multi-echo pulse sequences of the brain and surrounding structures were acquired without intravenous contrast. Angiographic images of the Circle of Willis were acquired using MRA technique without intravenous contrast. COMPARISON:  Head CT  10/21/2022 FINDINGS: MRI HEAD FINDINGS Brain: There is a punctate acute or early subacute infarct in the right corona radiata. No intracranial hemorrhage, mass, midline shift, or extra-axial fluid collection is identified. T2 hyperintensities in the cerebral white matter bilaterally are nonspecific but compatible with mild chronic small vessel ischemic disease. There is mild cerebral atrophy. Vascular: Major intracranial vascular flow voids are preserved. Skull and upper cervical spine: Unremarkable bone marrow signal. Sinuses/Orbits: Bilateral cataract extraction. Paranasal sinuses and mastoid air cells are clear. Other: None. MRA HEAD FINDINGS Anterior circulation: The internal carotid arteries are widely patent from skull base to carotid termini. ACAs and MCAs are patent without evidence of a proximal branch occlusion or significant proximal stenosis. No aneurysm is identified. Posterior circulation: The included portions of the intracranial vertebral arteries are patent to the basilar with a moderate stenosis of the distal left V4 segment. The basilar artery is widely patent. Posterior communicating arteries are diminutive or absent. Both PCAs are patent without evidence of a significant proximal stenosis. No aneurysm is identified. Anatomic variants: None. IMPRESSION: 1. 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 2. Mild chronic small vessel ischemic disease. 3. Moderate left V4 stenosis.  Otherwise negative head MRA. Electronically Signed   By: Aundra Lee M.D.   On: 09/14/2023 21:15        Scheduled Meds:  acetaminophen  325 mg Oral QHS   aspirin EC  81 mg Oral Daily   atorvastatin  80 mg Oral QPM   cholecalciferol  3,000 Units Oral Daily   clopidogrel  75 mg Oral Daily   cyanocobalamin  5,000 mcg Oral Daily   enoxaparin (LOVENOX) injection  30 mg Subcutaneous Q24H   loratadine  10 mg Oral Daily   metoprolol tartrate  12.5 mg Oral BID   Continuous Infusions:  sodium  chloride 100 mL/hr at 09/15/23 1039     LOS: 0 days    Time spent: 40 minutes    Hilda Lovings, MD [facility]   To contact the attending provider between 7A-7P or the covering provider during after hours 7P-7A, please log into the web site [email or web address] and access using universal [facility] password for that web site. If you do not have the password, please call the hospital operator.  09/15/2023, 4:39 PM

## 2023-09-16 ENCOUNTER — Other Ambulatory Visit: Payer: Self-pay

## 2023-09-16 DIAGNOSIS — N179 Acute kidney failure, unspecified: Secondary | ICD-10-CM | POA: Diagnosis not present

## 2023-09-16 DIAGNOSIS — I6521 Occlusion and stenosis of right carotid artery: Secondary | ICD-10-CM | POA: Diagnosis not present

## 2023-09-16 DIAGNOSIS — I10 Essential (primary) hypertension: Secondary | ICD-10-CM | POA: Diagnosis not present

## 2023-09-16 DIAGNOSIS — H3411 Central retinal artery occlusion, right eye: Secondary | ICD-10-CM | POA: Diagnosis not present

## 2023-09-16 DIAGNOSIS — M069 Rheumatoid arthritis, unspecified: Secondary | ICD-10-CM | POA: Diagnosis not present

## 2023-09-16 LAB — CBC
HCT: 33.7 % — ABNORMAL LOW (ref 36.0–46.0)
Hemoglobin: 11.1 g/dL — ABNORMAL LOW (ref 12.0–15.0)
MCH: 29.7 pg (ref 26.0–34.0)
MCHC: 32.9 g/dL (ref 30.0–36.0)
MCV: 90.1 fL (ref 80.0–100.0)
Platelets: 268 10*3/uL (ref 150–400)
RBC: 3.74 MIL/uL — ABNORMAL LOW (ref 3.87–5.11)
RDW: 15.1 % (ref 11.5–15.5)
WBC: 8 10*3/uL (ref 4.0–10.5)
nRBC: 0 % (ref 0.0–0.2)

## 2023-09-16 LAB — RENAL FUNCTION PANEL
Albumin: 3.5 g/dL (ref 3.5–5.0)
Anion gap: 9 (ref 5–15)
BUN: 14 mg/dL (ref 8–23)
CO2: 24 mmol/L (ref 22–32)
Calcium: 9.3 mg/dL (ref 8.9–10.3)
Chloride: 107 mmol/L (ref 98–111)
Creatinine, Ser: 0.99 mg/dL (ref 0.44–1.00)
GFR, Estimated: 56 mL/min — ABNORMAL LOW (ref >60)
Glucose, Bld: 86 mg/dL (ref 70–99)
Phosphorus: 4.2 mg/dL (ref 2.5–4.6)
Potassium: 3.9 mmol/L (ref 3.5–5.1)
Sodium: 140 mmol/L (ref 135–145)

## 2023-09-16 LAB — MAGNESIUM: Magnesium: 2.2 mg/dL (ref 1.7–2.4)

## 2023-09-16 LAB — SURGICAL PCR SCREEN
MRSA, PCR: NEGATIVE
Staphylococcus aureus: NEGATIVE

## 2023-09-16 NOTE — Progress Notes (Signed)
 [facility]                                                                               Jacqueline Burke, is a 88 y.o. female, DOB - 1935-12-31, WJX:[id number] Admit date - 09/14/2023    Outpatient Primary MD for the patient is Shon Hale, MD  LOS - 1  days    Brief summary   Patient pleasant 88 year old female history of CAD, hypertension, hyperlipidemia, rheumatoid arthritis presented to the ED with sudden onset right visual field loss.  Patient seen by ophthalmologist and noted to have central retinal artery occlusion and sent to the ED for further evaluation.  Patient seen in the ED MRI/MRA head done concerning for acute CVA.  CT angiogram head and neck done with concerns for 50% stenosis at the origin of the right cervical ICA as well as stenosis at the ECA.  Neurology consulted and following.  Neurology consulted vascular for further evaluation due to concern for symptomatic carotid artery stenosis.     Assessment & Plan    Assessment and Plan:   Central retinal artery occlusion secondary to acute CVA from carotid artery stenosis Patient was admitted with sudden visual field loss in the right. Patient was seen by ophthalmology and noted to have a central retinal artery occlusion and sent to ED for further stroke workup She had an MRI and MRI of the head showing punctate acute/early subacute infarct in the right corona radiator CT angiogram of the head and neck showed 50% stenosis at the origin of the right cervical ICA 2D echo cardiogram reviewed.  Fasting lipid panel shows LDL of 57 and hemoglobin A1c of 5.8 She was seen by neurology and vascular surgeon. Vascular surgery plans for TCAR tentatively planned for tomorrow Meanwhile continue the patient on aspirin, Plavix and Lipitor   AKI Resolved with IV fluids   Hypertension Blood pressure parameters are optimal at this time continue with metoprolol and  Norvasc   Hypomagnesemia Replaced    History of rheumatoid arthritis Continue with home medications and follow-up with rheumatologist     Estimated body mass index is 23.87 kg/m as calculated from the following:   Height as of this encounter: 5\' 1"  (1.549 m).   Weight as of this encounter: 57.3 kg.  Code Status: full code.  DVT Prophylaxis:  enoxaparin (LOVENOX) injection 30 mg Start: 09/14/23 1800   Level of Care: Level of care: Telemetry Medical Family Communication: Updated patient's family at bedside.   Disposition Plan:     Remains inpatient appropriate:  Pending further eval  Procedures:  TCAR ON 4/17  Consultants:   Neurology Vascular surgery.   Antimicrobials:   Anti-infectives (From admission, onward)    None        Medications  Scheduled Meds:  acetaminophen  325 mg Oral QHS   aspirin EC  81 mg Oral Daily   atorvastatin  80 mg Oral QPM   cholecalciferol  3,000 Units Oral Daily   clopidogrel  75 mg Oral Daily   cyanocobalamin  5,000 mcg Oral Daily   enoxaparin (LOVENOX) injection  30 mg Subcutaneous Q24H   loratadine  10 mg Oral Daily  metoprolol tartrate  12.5 mg Oral BID   Continuous Infusions: PRN Meds:.acetaminophen **OR** acetaminophen (TYLENOL) oral liquid 160 mg/5 mL **OR** acetaminophen, albuterol, fluticasone, gabapentin, nitroGLYCERIN, ondansetron (ZOFRAN) IV, senna-docusate    Subjective:   Jacqueline Burke was seen and examined today.  No chest pain or sob, no nausea, vomiting or abd pain.   Objective:   Vitals:   09/16/23 0345 09/16/23 0533 09/16/23 0750 09/16/23 1109  BP: (!) 155/87  108/79 (!) 122/109  Pulse: 60  (!) 59 75  Resp:   16 16  Temp: 98.4 F (36.9 C)  97.8 F (36.6 C) (!) 97.4 F (36.3 C)  TempSrc: Oral  Oral Oral  SpO2: 95%  92% 98%  Weight:  57.3 kg    Height:        Intake/Output Summary (Last 24 hours) at 09/16/2023 1412 Last data filed at 09/15/2023 1430 Gross per 24 hour  Intake 230 ml  Output  --  Net 230 ml   Filed Weights   09/14/23 1516 09/16/23 0533  Weight: 57.2 kg 57.3 kg     Exam General: Alert and oriented x 3, NAD Cardiovascular: S1 S2 auscultated, no murmurs, RRR Respiratory: Clear to auscultation bilaterally, no wheezing, rales or rhonchi Gastrointestinal: Soft, nontender, nondistended, + bowel sounds Ext: no pedal edema bilaterally Neuro: AAOx3, Cr N's II- XII. Strength 5/5 upper and lower extremities bilaterally Skin: No rashes Psych: Normal affect and demeanor, alert and oriented x3    Data Reviewed:  I have personally reviewed following labs and imaging studies   CBC Lab Results  Component Value Date   WBC 8.0 09/16/2023   RBC 3.74 (L) 09/16/2023   HGB 11.1 (L) 09/16/2023   HCT 33.7 (L) 09/16/2023   MCV 90.1 09/16/2023   MCH 29.7 09/16/2023   PLT 268 09/16/2023   MCHC 32.9 09/16/2023   RDW 15.1 09/16/2023   LYMPHSABS 2.0 09/14/2023   MONOABS 0.7 09/14/2023   EOSABS 0.2 09/14/2023   BASOSABS 0.1 09/14/2023     Last metabolic panel Lab Results  Component Value Date   NA 140 09/16/2023   K 3.9 09/16/2023   CL 107 09/16/2023   CO2 24 09/16/2023   BUN 14 09/16/2023   CREATININE 0.99 09/16/2023   GLUCOSE 86 09/16/2023   GFRNONAA 56 (L) 09/16/2023   GFRAA 56 (L) 07/17/2017   CALCIUM 9.3 09/16/2023   PHOS 4.2 09/16/2023   PROT 7.2 09/14/2023   ALBUMIN 3.5 09/16/2023   LABGLOB 3.1 07/22/2022   BILITOT 0.5 09/14/2023   ALKPHOS 38 09/14/2023   AST 20 09/14/2023   ALT 15 09/14/2023   ANIONGAP 9 09/16/2023    CBG (last 3)  Recent Labs    09/14/23 1521  GLUCAP 92      Coagulation Profile: Recent Labs  Lab 09/14/23 1520  INR 1.0     Radiology Studies: VAS US CAROTID Result Date: 09/15/2023 Carotid Arterial Duplex Study Patient Name:  Jacqueline Burke  Date of Exam:   09/15/2023 Medical Rec #: [id number]       Accession #:    [id number] Date of Birth: 02-26-1936        Patient Gender: F Patient Age:   68 years Exam Location:   [facility] Procedure:      VAS US CAROTID Referring Phys: Carolynn Sayers --------------------------------------------------------------------------------  Indications:       Carotid artery disease, Visual disturbance and onset of right  visual loss. Risk Factors:      Hypertension, hyperlipidemia, past history of smoking,                    coronary artery disease. Other Factors:     GERD. Comparison Study:  09/15/23 - CT ANGIO HEAD NECK showed right ICA approximately                    50% stenosis. Left ICA no significant stenosis.                     08/06/2015 - Carotid - [facility] showed right ICA 50-69%                    stenosis, left ICA WNL. Performing Technologist: [place] RDMS, RVT  Examination Guidelines: A complete evaluation includes B-mode imaging, spectral Doppler, color Doppler, and power Doppler as needed of all accessible portions of each vessel. Bilateral testing is considered an integral part of a complete examination. Limited examinations for reoccurring indications may be performed as noted.  Right Carotid Findings: +----------+--------+--------+--------+------------------+--------+           PSV cm/sEDV cm/sStenosisPlaque DescriptionComments +----------+--------+--------+--------+------------------+--------+ CCA Prox  57      17                                         +----------+--------+--------+--------+------------------+--------+ CCA Distal62      15                                         +----------+--------+--------+--------+------------------+--------+ ICA Prox  190     44      40-59%  heterogenous               +----------+--------+--------+--------+------------------+--------+ ICA Mid   130     17                                         +----------+--------+--------+--------+------------------+--------+ ICA Distal72      27                                          +----------+--------+--------+--------+------------------+--------+ ECA       106     15                                         +----------+--------+--------+--------+------------------+--------+ +----------+--------+-------+----------------+-------------------+           PSV cm/sEDV cmsDescribe        Arm Pressure (mmHG) +----------+--------+-------+----------------+-------------------+ [id number]            Multiphasic, WNL                    +----------+--------+-------+----------------+-------------------+ +---------+--------+--+--------+--+---------+ VertebralPSV cm/s38EDV cm/s13Antegrade +---------+--------+--+--------+--+---------+  Left Carotid Findings: +----------+--------+--------+--------+------------------+--------+           PSV cm/sEDV cm/sStenosisPlaque DescriptionComments +----------+--------+--------+--------+------------------+--------+ CCA Prox  73      17                                         +----------+--------+--------+--------+------------------+--------+  CCA Distal98      19                                         +----------+--------+--------+--------+------------------+--------+ ICA Prox  93      17      1-39%   heterogenous               +----------+--------+--------+--------+------------------+--------+ ICA Distal119     15                                         +----------+--------+--------+--------+------------------+--------+ ECA       156                                                +----------+--------+--------+--------+------------------+--------+ +----------+--------+--------+----------------+-------------------+           PSV cm/sEDV cm/sDescribe        Arm Pressure (mmHG) +----------+--------+--------+----------------+-------------------+ [id number]     29      Multiphasic, WNL                    +----------+--------+--------+----------------+-------------------+  +---------+--------+--+--------+--+---------+ VertebralPSV cm/s47EDV cm/s14Antegrade +---------+--------+--+--------+--+---------+   Summary:   *See table(s) above for measurements and observations.  Electronically signed by Delaney Fearing on 09/15/2023 at 5:26:22 PM.    Final    ECHOCARDIOGRAM COMPLETE Result Date: 09/15/2023    ECHOCARDIOGRAM REPORT   Patient Name:   Jacqueline Burke Date of Exam: 09/15/2023 Medical Rec #:  [id number]      Height:       61.0 in Accession #:    [id number]     Weight:       126.0 lb Date of Birth:  Sep 12, 1935       BSA:          1.552 m Patient Age:    87 years       BP:           141/67 mmHg Patient Gender: F              HR:           67 bpm. Exam Location:  Inpatient Procedure: 2D Echo, Cardiac Doppler and Color Doppler (Both Spectral and Color            Flow Doppler were utilized during procedure). Indications:    CRAO (central retinal artery occlusion), right  History:        Patient has prior history of Echocardiogram examinations, most                 recent 04/23/2021. CAD and Previous Myocardial Infarction; Risk                 Factors:Hypertension.  Sonographer:    Astrid Blamer Referring Phys: [id number] DANIEL V THOMPSON IMPRESSIONS  1. Left ventricular ejection fraction, by estimation, is 60 to 65%. The left ventricle has normal function. The left ventricle has no regional wall motion abnormalities. There is mild left ventricular hypertrophy. Left ventricular diastolic parameters are consistent with Grade I diastolic dysfunction (impaired relaxation).  2. Right ventricular systolic function is normal. The right ventricular size is normal.  3. The mitral valve  is normal in structure. No evidence of mitral valve regurgitation. No evidence of mitral stenosis.  4. The aortic valve is tricuspid. There is mild calcification of the aortic valve. Aortic valve regurgitation is not visualized. Aortic valve sclerosis is present, with no evidence of aortic valve stenosis. Aortic valve  Vmax measures 1.04 m/s.  5. The inferior vena cava is normal in size with greater than 50% respiratory variability, suggesting right atrial pressure of 3 mmHg. FINDINGS  Left Ventricle: Left ventricular ejection fraction, by estimation, is 60 to 65%. The left ventricle has normal function. The left ventricle has no regional wall motion abnormalities. The left ventricular internal cavity size was normal in size. There is  mild left ventricular hypertrophy. Left ventricular diastolic parameters are consistent with Grade I diastolic dysfunction (impaired relaxation). Right Ventricle: The right ventricular size is normal. No increase in right ventricular wall thickness. Right ventricular systolic function is normal. Left Atrium: Left atrial size was normal in size. Right Atrium: Right atrial size was normal in size. Pericardium: There is no evidence of pericardial effusion. Mitral Valve: The mitral valve is normal in structure. No evidence of mitral valve regurgitation. No evidence of mitral valve stenosis. Tricuspid Valve: The tricuspid valve is normal in structure. Tricuspid valve regurgitation is not demonstrated. No evidence of tricuspid stenosis. Aortic Valve: The aortic valve is tricuspid. There is mild calcification of the aortic valve. Aortic valve regurgitation is not visualized. Aortic valve sclerosis is present, with no evidence of aortic valve stenosis. Aortic valve mean gradient measures 3.0 mmHg. Aortic valve peak gradient measures 4.3 mmHg. Aortic valve area, by VTI measures 2.59 cm. Pulmonic Valve: The pulmonic valve was normal in structure. Pulmonic valve regurgitation is not visualized. No evidence of pulmonic stenosis. Aorta: The aortic root is normal in size and structure. Venous: The inferior vena cava is normal in size with greater than 50% respiratory variability, suggesting right atrial pressure of 3 mmHg. IAS/Shunts: No atrial level shunt detected by color flow Doppler.  LEFT VENTRICLE PLAX 2D  LVIDd:         3.50 cm   Diastology LV PW:         1.10 cm   LV e' medial:    7.40 cm/s LV IVS:        1.20 cm   LV E/e' medial:  12.2 LVOT diam:     1.80 cm   LV e' lateral:   6.20 cm/s LV SV:         78        LV E/e' lateral: 14.5 LV SV Index:   50 LVOT Area:     2.54 cm  RIGHT VENTRICLE RV S prime:     12.70 cm/s TAPSE (M-mode): 2.1 cm LEFT ATRIUM             Index        RIGHT ATRIUM          Index LA Vol (A2C):   26.6 ml 17.14 ml/m  RA Area:     9.48 cm LA Vol (A4C):   41.3 ml 26.61 ml/m  RA Volume:   17.00 ml 10.95 ml/m LA Biplane Vol: 35.1 ml 22.62 ml/m  AORTIC VALVE AV Area (Vmax):    2.81 cm AV Area (Vmean):   2.47 cm AV Area (VTI):     2.59 cm AV Vmax:           104.00 cm/s AV Vmean:  83.200 cm/s AV VTI:            0.303 m AV Peak Grad:      4.3 mmHg AV Mean Grad:      3.0 mmHg LVOT Vmax:         115.00 cm/s LVOT Vmean:        80.700 cm/s LVOT VTI:          0.308 m LVOT/AV VTI ratio: 1.02  AORTA Ao Root diam: 2.80 cm MITRAL VALVE MV Area (PHT): 1.68 cm     SHUNTS MV Decel Time: 451 msec     Systemic VTI:  0.31 m MV E velocity: 90.10 cm/s   Systemic Diam: 1.80 cm MV A velocity: 131.00 cm/s MV E/A ratio:  0.69 Dorothye Gathers MD Electronically signed by Dorothye Gathers MD Signature Date/Time: 09/15/2023/2:11:37 PM    Final    CT ANGIO HEAD NECK W WO CM Result Date: 09/15/2023 CLINICAL DATA:  Stroke/TIA, vision loss in right eye. EXAM: CT ANGIOGRAPHY HEAD AND NECK WITH AND WITHOUT CONTRAST TECHNIQUE: Multidetector CT imaging of the head and neck was performed using the standard protocol during bolus administration of intravenous contrast. Multiplanar CT image reconstructions and MIPs were obtained to evaluate the vascular anatomy. Carotid stenosis measurements (when applicable) are obtained utilizing NASCET criteria, using the distal internal carotid diameter as the denominator. RADIATION DOSE REDUCTION: This exam was performed according to the departmental dose-optimization program which  includes automated exposure control, adjustment of the mA and/or kV according to patient size and/or use of iterative reconstruction technique. CONTRAST:  75mL OMNIPAQUE IOHEXOL 350 MG/ML SOLN COMPARISON:  MRA head 09/14/2023. FINDINGS: CT HEAD FINDINGS Brain: No acute intracranial hemorrhage. No CT evidence of acute infarct. Nonspecific hypoattenuation in the periventricular and subcortical white matter favored to reflect chronic microvascular ischemic changes. No edema, mass effect, or midline shift. The basilar cisterns are patent. Ventricles: Prominence of the ventricles suggestive of underlying parenchymal volume loss. Vascular: No hyperdense vessel. Intracranial atherosclerotic calcifications noted. Skull: No acute or aggressive finding. Sinuses/orbits: Postsurgical changes of both globes. Mild mucosal thickening in the ethmoid sinuses. Other: Mastoid air cells are clear. CTA NECK FINDINGS Aortic arch: Limited visualization of the aortic arch. There is mild atherosclerosis along the distal aortic arch. No evidence of aneurysm of the distal arch. Pulmonary arteries: As permitted by contrast timing, there are no filling defects in the visualized pulmonary arteries. Subclavian arteries: Patent bilaterally. Atherosclerosis involving the right brachiocephalic artery resulting in at least mild stenosis. Additional atherosclerosis of the proximal right subclavian artery. Atherosclerosis of the proximal left subclavian artery without significant stenosis. Right carotid system: Patent from the origin to the skull base. Atherosclerosis at the carotid bifurcation resulting in approximately 50% stenosis at the origin of the right cervical ICA. Additional stenosis at the origin of the external carotid artery. Left carotid system: Patent from the origin to the skull base. Atherosclerosis at the carotid bifurcation without hemodynamically significant stenosis. Vertebral arteries: Codominant. No evidence of dissection or  occlusion. Atherosclerosis at the origin of the right vertebral artery resulting in mild stenosis. There is atherosclerosis of the left subclavian artery just proximal to the vertebral artery origin without significant stenosis. Atherosclerosis involving the bilateral V4 segments with mild stenosis on the right and moderate stenosis on the left. Skeleton: No acute findings. Degenerative changes in the cervical spine. Disc space narrowing in the cervical spine is most pronounced at C6-7. Anterolisthesis of C4 on C5 likely related to facet degenerative changes. Other neck:  The visualized airway is patent. No cervical lymphadenopathy. Upper chest: Emphysema in the visualized lung apices. Review of the MIP images confirms the above findings CTA HEAD FINDINGS ANTERIOR CIRCULATION: The intracranial ICAs are patent bilaterally. Atherosclerosis of the bilateral carotid siphons. Mild stenosis of the right supraclinoid ICA. No significant stenosis, proximal occlusion, aneurysm, or vascular malformation. MCAs: Patent bilaterally. Mild narrowing of the proximal right M1 segment. There is severe stenosis of an M2 inferior division branch of the right MCA. Early branching of the left M1 segment. ACAs: The anterior cerebral arteries are patent bilaterally. POSTERIOR CIRCULATION: No significant stenosis, proximal occlusion, aneurysm, or vascular malformation. PCAs: The posterior cerebral arteries are patent bilaterally. Pcomm: Not well visualized. SCAs: The superior cerebellar arteries are patent bilaterally. Basilar artery: Patent AICAs: Patent PICAs: Patent Vertebral arteries: The intracranial vertebral arteries are patent. Venous sinuses: As permitted by contrast timing, patent. Anatomic variants: None Review of the MIP images confirms the above findings IMPRESSION: No large vessel occlusion. No acute intracranial hemorrhage. The small right corona radiata infarct noted on MRI is not well appreciated on the current study.  Atherosclerosis of the arteries in the neck. Approximately 50% stenosis at the origin of the right cervical ICA. Intracranial atherosclerosis as above. Moderate stenosis of the V4 segment left vertebral artery. Mild stenosis of the right supraclinoid ICA. Severe stenosis of a small M2 inferior division branch of the right MCA. Aortic Atherosclerosis (ICD10-I70.0) and Emphysema (ICD10-J43.9). Electronically Signed   By: Emily Filbert M.D.   On: 09/15/2023 10:23   MR BRAIN WO CONTRAST Result Date: 09/14/2023 CLINICAL DATA:  Neuro deficit, acute, stroke suspected. Acute right eye vision loss. Central retinal artery occlusion. EXAM: MRI HEAD WITHOUT CONTRAST MRA HEAD WITHOUT CONTRAST TECHNIQUE: Multiplanar, multi-echo pulse sequences of the brain and surrounding structures were acquired without intravenous contrast. Angiographic images of the Circle of Willis were acquired using MRA technique without intravenous contrast. COMPARISON:  Head CT 10/21/2022 FINDINGS: MRI HEAD FINDINGS Brain: There is a punctate acute or early subacute infarct in the right corona radiata. No intracranial hemorrhage, mass, midline shift, or extra-axial fluid collection is identified. T2 hyperintensities in the cerebral white matter bilaterally are nonspecific but compatible with mild chronic small vessel ischemic disease. There is mild cerebral atrophy. Vascular: Major intracranial vascular flow voids are preserved. Skull and upper cervical spine: Unremarkable bone marrow signal. Sinuses/Orbits: Bilateral cataract extraction. Paranasal sinuses and mastoid air cells are clear. Other: None. MRA HEAD FINDINGS Anterior circulation: The internal carotid arteries are widely patent from skull base to carotid termini. ACAs and MCAs are patent without evidence of a proximal branch occlusion or significant proximal stenosis. No aneurysm is identified. Posterior circulation: The included portions of the intracranial vertebral arteries are patent to  the basilar with a moderate stenosis of the distal left V4 segment. The basilar artery is widely patent. Posterior communicating arteries are diminutive or absent. Both PCAs are patent without evidence of a significant proximal stenosis. No aneurysm is identified. Anatomic variants: None. IMPRESSION: 1. 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 2. Mild chronic small vessel ischemic disease. 3. Moderate left V4 stenosis.  Otherwise negative head MRA. Electronically Signed   By: Sebastian Ache M.D.   On: 09/14/2023 21:15   MR ANGIO HEAD WO CONTRAST Result Date: 09/14/2023 CLINICAL DATA:  Neuro deficit, acute, stroke suspected. Acute right eye vision loss. Central retinal artery occlusion. EXAM: MRI HEAD WITHOUT CONTRAST MRA HEAD WITHOUT CONTRAST TECHNIQUE: Multiplanar, multi-echo pulse sequences of the brain  and surrounding structures were acquired without intravenous contrast. Angiographic images of the Circle of Willis were acquired using MRA technique without intravenous contrast. COMPARISON:  Head CT 10/21/2022 FINDINGS: MRI HEAD FINDINGS Brain: There is a punctate acute or early subacute infarct in the right corona radiata. No intracranial hemorrhage, mass, midline shift, or extra-axial fluid collection is identified. T2 hyperintensities in the cerebral white matter bilaterally are nonspecific but compatible with mild chronic small vessel ischemic disease. There is mild cerebral atrophy. Vascular: Major intracranial vascular flow voids are preserved. Skull and upper cervical spine: Unremarkable bone marrow signal. Sinuses/Orbits: Bilateral cataract extraction. Paranasal sinuses and mastoid air cells are clear. Other: None. MRA HEAD FINDINGS Anterior circulation: The internal carotid arteries are widely patent from skull base to carotid termini. ACAs and MCAs are patent without evidence of a proximal branch occlusion or significant proximal stenosis. No aneurysm is identified. Posterior  circulation: The included portions of the intracranial vertebral arteries are patent to the basilar with a moderate stenosis of the distal left V4 segment. The basilar artery is widely patent. Posterior communicating arteries are diminutive or absent. Both PCAs are patent without evidence of a significant proximal stenosis. No aneurysm is identified. Anatomic variants: None. IMPRESSION: 1. 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 2. Mild chronic small vessel ischemic disease. 3. Moderate left V4 stenosis.  Otherwise negative head MRA. Electronically Signed   By: Aundra Lee M.D.   On: 09/14/2023 21:15       Feliciana Horn M.D. [facility] 09/16/2023, 2:12 PM  Available via Epic secure chat 7am-7pm After 7 pm, please refer to night coverage provider listed on amion.

## 2023-09-16 NOTE — Progress Notes (Addendum)
  Progress Note    09/16/2023 10:20 AM * No surgery found *  Subjective:  no further stroke like symptoms.  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   Vitals:   09/16/23 0345 09/16/23 0750  BP: (!) 155/87 108/79  Pulse: 60 (!) 59  Resp:  16  Temp: 98.4 F (36.9 C) 97.8 F (36.6 C)  SpO2: 95% 92%   Physical Exam: Lungs:  non labored Extremities:  moving all ext well Neurologic: CN grossly intact; R eye blindness  CBC    Component Value Date/Time   WBC 8.0 09/16/2023 0551   RBC 3.74 (L) 09/16/2023 0551   HGB 11.1 (L) 09/16/2023 0551   HGB 9.7 (L) 09/05/2022 0918   HGB 11.5 02/24/2018 0918   HCT 33.7 (L) 09/16/2023 0551   HCT 34.4 02/24/2018 0918   PLT 268 09/16/2023 0551   PLT 349 09/05/2022 0918   PLT 256 02/24/2018 0918   MCV 90.1 09/16/2023 0551   MCV 91 02/24/2018 0918   MCH 29.7 09/16/2023 0551   MCHC 32.9 09/16/2023 0551   RDW 15.1 09/16/2023 0551   RDW 13.8 02/24/2018 0918   LYMPHSABS 2.0 09/14/2023 1520   MONOABS 0.7 09/14/2023 1520   EOSABS 0.2 09/14/2023 1520   BASOSABS 0.1 09/14/2023 1520    BMET    Component Value Date/Time   NA 140 09/16/2023 0551   NA 143 07/02/2017 0856   K 3.9 09/16/2023 0551   CL 107 09/16/2023 0551   CO2 24 09/16/2023 0551   GLUCOSE 86 09/16/2023 0551   BUN 14 09/16/2023 0551   BUN 24 07/02/2017 0856   CREATININE 0.99 09/16/2023 0551   CREATININE 0.78 09/05/2022 0918   CALCIUM 9.3 09/16/2023 0551   CALCIUM 9.9 07/25/2022 1000   GFRNONAA 56 (L) 09/16/2023 0551   GFRNONAA >60 09/05/2022 0918   GFRAA 56 (L) 07/17/2017 0925    INR    Component Value Date/Time   INR 1.0 09/14/2023 1520     Intake/Output Summary (Last 24 hours) at 09/16/2023 1020 Last data filed at 09/15/2023 1430 Gross per 24 hour  Intake 230 ml  Output --  Net 230 ml     Assessment/Plan:  88 y.o. female with symptomatic right ICA stenosis  No further vision changes overnight.  She still has blindness in the right eye except  for some flashes of light.  Tentative plan is for right sided TCAR tomorrow in the operating room with Dr. Susi Eric.  Continue aspirin, Plavix, statin daily.  N.p.o. past midnight.  Consent ordered.    Cordie Deters, PA-C [facility] [phone] 09/16/2023 10:20 AM  VASCULAR STAFF ADDENDUM: I have independently interviewed and examined the patient. I agree with the above.  Discussed risks and benefits again with patient and husband. Plan for R TCAR tomorrow NPO midnight  Philipp Brawn MD [facility] Phone Number: [phone] 09/16/2023 12:14 PM

## 2023-09-16 NOTE — Progress Notes (Signed)
 Physical Therapy Treatment Patient Details Name: Jacqueline Burke MRN: [id number] DOB: 1935/10/16 Today's Date: 09/16/2023   History of Present Illness 88 y.o. female who presented to the hospital with sudden loss of vision in the right eye. Pt with CRAO.  MRA/MRI that revealed punctate acute/early subacute infarct in the right corona radiata and mild chronic small vessel ischemic disease and moderate left V4 stenosis. Vascular following for possible carotid endarterectomy and carotid stenting. PMH: HTN, CAD with hx of NSTEMI and hx of PCI of RCA with DES, hx of E coli bacteremia in 2019.    PT Comments  Pt tolerated treatment well today. Pt was able to ambulate hallway and navigate stairs CGA/Supervision. Initially started with no AD CGA however transitioned to [facility] which pt appeared much more steady only requiring supervision. No change in DC/DME recs at this time. PT will continue to follow.     If plan is discharge home, recommend the following: A little help with walking and/or transfers;A little help with bathing/dressing/bathroom;Help with stairs or ramp for entrance;Assist for transportation;Assistance with cooking/housework   Can travel by private vehicle        Equipment Recommendations  None recommended by PT    Recommendations for Other Services       Precautions / Restrictions Precautions Precautions: Fall Recall of Precautions/Restrictions: Intact Precaution/Restrictions Comments: R eye blindness Restrictions Weight Bearing Restrictions Per Provider Order: No     Mobility  Bed Mobility               General bed mobility comments: Seated EOB    Transfers Overall transfer level: Needs assistance Equipment used: None Transfers: Sit to/from Stand Sit to Stand: Supervision                Ambulation/Gait Ambulation/Gait assistance: Supervision, Contact guard assist Gait Distance (Feet): 300 Feet Assistive device: None, Straight cane Gait  Pattern/deviations: Step-through pattern, Drifts right/left, Narrow base of support Gait velocity: decreased     General Gait Details: Initially started with no AD which pt required CGA due to slight imbalance however pt appeared much more steady with SPC in R hand.   Stairs Stairs: Yes Stairs assistance: Contact guard assist Stair Management: One rail Left, Step to pattern, Forwards Number of Stairs: 2 General stair comments: no LOB noted.   Wheelchair Mobility     Tilt Bed    Modified Rankin (Stroke Patients Only) Modified Rankin (Stroke Patients Only) Pre-Morbid Rankin Score: No symptoms Modified Rankin: Moderately severe disability     Balance Overall balance assessment: Mild deficits observed, not formally tested                                          Communication Communication Communication: No apparent difficulties  Cognition Arousal: Alert Behavior During Therapy: WFL for tasks assessed/performed   PT - Cognitive impairments: No apparent impairments                         Following commands: Intact      Cueing Cueing Techniques: Verbal cues, Tactile cues  Exercises      General Comments General comments (skin integrity, edema, etc.): VSS      Pertinent Vitals/Pain Pain Assessment Pain Assessment: No/denies pain    Home Living  Prior Function            PT Goals (current goals can now be found in the care plan section) Progress towards PT goals: Progressing toward goals    Frequency    Min 3X/week      PT Plan      Co-evaluation              AM-PAC PT "6 Clicks" Mobility   Outcome Measure  Help needed turning from your back to your side while in a flat bed without using bedrails?: A Little Help needed moving from lying on your back to sitting on the side of a flat bed without using bedrails?: A Little Help needed moving to and from a bed to a chair  (including a wheelchair)?: A Little Help needed standing up from a chair using your arms (e.g., wheelchair or bedside chair)?: A Little Help needed to walk in hospital room?: A Little Help needed climbing 3-5 steps with a railing? : A Little 6 Click Score: 18    End of Session Equipment Utilized During Treatment: Gait belt Activity Tolerance: Patient tolerated treatment well Patient left: in bed;with call bell/phone within reach;with family/visitor present Nurse Communication: Mobility status PT Visit Diagnosis: Unsteadiness on feet (R26.81);Muscle weakness (generalized) (M62.81)     Time: 1048-1100 PT Time Calculation (min) (ACUTE ONLY): 12 min  Charges:    $Gait Training: 8-22 mins PT General Charges $$ ACUTE PT VISIT: 1 Visit                     Rodgers Clack, PT, DPT [facility] [phone]    Keanon Bevins 09/16/2023, 12:07 PM

## 2023-09-16 NOTE — Plan of Care (Signed)
  Problem: Coping: Goal: Will verbalize positive feelings about self Outcome: Progressing Goal: Will identify appropriate support needs Outcome: Progressing   Problem: Self-Care: Goal: Ability to participate in self-care as condition permits will improve Outcome: Progressing Goal: Verbalization of feelings and concerns over difficulty with self-care will improve Outcome: Progressing Goal: Ability to communicate needs accurately will improve Outcome: Progressing   Problem: Nutrition: Goal: Risk of aspiration will decrease Outcome: Progressing Goal: Dietary intake will improve Outcome: Progressing   Problem: Clinical Measurements: Goal: Ability to maintain clinical measurements within normal limits will improve Outcome: Progressing Goal: Will remain free from infection Outcome: Progressing Goal: Diagnostic test results will improve Outcome: Progressing Goal: Respiratory complications will improve Outcome: Progressing Goal: Cardiovascular complication will be avoided Outcome: Progressing   Problem: Activity: Goal: Risk for activity intolerance will decrease Outcome: Progressing   Problem: Elimination: Goal: Will not experience complications related to bowel motility Outcome: Progressing Goal: Will not experience complications related to urinary retention Outcome: Progressing   Problem: Pain Managment: Goal: General experience of comfort will improve and/or be controlled Outcome: Progressing   Problem: Safety: Goal: Ability to remain free from injury will improve Outcome: Progressing   Problem: Skin Integrity: Goal: Risk for impaired skin integrity will decrease Outcome: Progressing

## 2023-09-16 NOTE — Anesthesia Preprocedure Evaluation (Addendum)
 Anesthesia Evaluation  Patient identified by MRN, date of birth, ID band Patient awake    Reviewed: Allergy & Precautions, NPO status , Patient's Chart, lab work & pertinent test results  History of Anesthesia Complications (+) history of anesthetic complications  Airway Mallampati: II       Dental no notable dental hx. (+) Dental Advisory Given   Pulmonary shortness of breath and with exertion, COPD,  COPD inhaler, former smoker ILD- pulmonary fibrosis   Pulmonary exam normal breath sounds clear to auscultation       Cardiovascular hypertension, Pt. on medications + CAD, + Past MI and + Cardiac Stents  Normal cardiovascular exam+ Valvular Problems/Murmurs  Rhythm:Regular Rate:Normal  Central retinal artery occlusion Carotid stenosis 50% R ICA  IKG 09/16/23 NSR, 1st deg AVB  Echo 09/15/23 1. Left ventricular ejection fraction, by estimation, is 60 to 65%. The  left ventricle has normal function. The left ventricle has no regional  wall motion abnormalities. There is mild left ventricular hypertrophy.  Left ventricular diastolic parameters  are consistent with Grade I diastolic dysfunction (impaired relaxation).   2. Right ventricular systolic function is normal. The right ventricular  size is normal.   3. The mitral valve is normal in structure. No evidence of mitral valve  regurgitation. No evidence of mitral stenosis.   4. The aortic valve is tricuspid. There is mild calcification of the  aortic valve. Aortic valve regurgitation is not visualized. Aortic valve  sclerosis is present, with no evidence of aortic valve stenosis. Aortic  valve Vmax measures 1.04 m/s.   5. The inferior vena cava is normal in size with greater than 50%  respiratory variability, suggesting right atrial pressure of 3 mmHg.   Cardiac Cath 06/15/2017  Mid RCA lesion is 95% stenosed.  A stent was successfully placed.  Post intervention, there  is a 0% residual stenosis.  The left ventricular systolic function is normal.  LV end diastolic pressure is normal.  The left ventricular ejection fraction is 55-65% by visual estimate.     Neuro/Psych Vision loss OD CVA, Residual Symptoms  negative psych ROS   GI/Hepatic Neg liver ROS,GERD  Medicated,,  Endo/Other  HLD  Renal/GU Renal diseaseHx/o renal calculus   negative genitourinary   Musculoskeletal  (+) Arthritis , Rheumatoid disorders,    Abdominal   Peds  Hematology  (+) Blood dyscrasia, anemia   Anesthesia Other Findings   Reproductive/Obstetrics                             Anesthesia Physical Anesthesia Plan  ASA: 3  Anesthesia Plan: General   Post-op Pain Management: Tylenol PO (pre-op)*   Induction: Intravenous  PONV Risk Score and Plan: 4 or greater and Treatment may vary due to age or medical condition, Ondansetron and Dexamethasone  Airway Management Planned: Oral ETT  Additional Equipment: Arterial line  Intra-op Plan:   Post-operative Plan: Extubation in OR  Informed Consent: I have reviewed the patients History and Physical, chart, labs and discussed the procedure including the risks, benefits and alternatives for the proposed anesthesia with the patient or authorized representative who has indicated his/her understanding and acceptance.     Dental advisory given  Plan Discussed with: CRNA and Anesthesiologist  Anesthesia Plan Comments: (Remifentanil infusion)        Anesthesia Quick Evaluation

## 2023-09-17 ENCOUNTER — Inpatient Hospital Stay (HOSPITAL_COMMUNITY)

## 2023-09-17 ENCOUNTER — Encounter (HOSPITAL_COMMUNITY)
Admission: EM | Disposition: A | Discharge status: Home / Self Care | Payer: Self-pay | Source: Ambulatory Visit | Attending: Internal Medicine

## 2023-09-17 ENCOUNTER — Inpatient Hospital Stay (HOSPITAL_COMMUNITY): Admitting: Certified Registered"

## 2023-09-17 ENCOUNTER — Encounter (HOSPITAL_COMMUNITY): Payer: Self-pay | Admitting: Internal Medicine

## 2023-09-17 ENCOUNTER — Other Ambulatory Visit: Payer: Self-pay

## 2023-09-17 DIAGNOSIS — I6521 Occlusion and stenosis of right carotid artery: Secondary | ICD-10-CM

## 2023-09-17 DIAGNOSIS — M069 Rheumatoid arthritis, unspecified: Secondary | ICD-10-CM | POA: Diagnosis not present

## 2023-09-17 DIAGNOSIS — H3411 Central retinal artery occlusion, right eye: Secondary | ICD-10-CM | POA: Diagnosis not present

## 2023-09-17 DIAGNOSIS — I10 Essential (primary) hypertension: Secondary | ICD-10-CM

## 2023-09-17 DIAGNOSIS — I2510 Atherosclerotic heart disease of native coronary artery without angina pectoris: Secondary | ICD-10-CM

## 2023-09-17 DIAGNOSIS — N179 Acute kidney failure, unspecified: Secondary | ICD-10-CM | POA: Diagnosis not present

## 2023-09-17 DIAGNOSIS — Z87891 Personal history of nicotine dependence: Secondary | ICD-10-CM

## 2023-09-17 HISTORY — PX: ULTRASOUND GUIDANCE FOR VASCULAR ACCESS: SHX6516

## 2023-09-17 HISTORY — PX: TRANSCAROTID ARTERY REVASCULARIZATIONÂ: SHX6778

## 2023-09-17 LAB — POCT ACTIVATED CLOTTING TIME
Activated Clotting Time: 233 s
Activated Clotting Time: 245 s

## 2023-09-17 LAB — GLUCOSE, CAPILLARY: Glucose-Capillary: 84 mg/dL (ref 70–99)

## 2023-09-17 SURGERY — TRANSCAROTID ARTERY REVASCULARIZATION (TCAR)
Anesthesia: General | Site: Neck | Laterality: Right

## 2023-09-17 MED ORDER — IODIXANOL 320 MG/ML IV SOLN
INTRAVENOUS | Status: DC | PRN
  Administered 2023-09-17: 20 mL via INTRA_ARTERIAL

## 2023-09-17 MED ORDER — PANTOPRAZOLE SODIUM 40 MG PO TBEC
40.0000 mg | DELAYED_RELEASE_TABLET | Freq: Every day | ORAL | Status: DC
  Administered 2023-09-17 – 2023-09-18 (×2): 40 mg via ORAL
  Filled 2023-09-17 (×2): qty 1

## 2023-09-17 MED ORDER — PROPOFOL 10 MG/ML IV BOLUS
INTRAVENOUS | Status: AC
  Filled 2023-09-17: qty 20

## 2023-09-17 MED ORDER — CLOPIDOGREL BISULFATE 300 MG PO TABS
300.0000 mg | ORAL_TABLET | Freq: Once | ORAL | Status: AC
  Administered 2023-09-17: 300 mg via ORAL
  Filled 2023-09-17: qty 1

## 2023-09-17 MED ORDER — CEFAZOLIN SODIUM-DEXTROSE 2-3 GM-%(50ML) IV SOLR
INTRAVENOUS | Status: DC | PRN
  Administered 2023-09-17: 2 g via INTRAVENOUS

## 2023-09-17 MED ORDER — ONDANSETRON HCL 4 MG/2ML IJ SOLN
INTRAMUSCULAR | Status: AC
  Filled 2023-09-17: qty 2

## 2023-09-17 MED ORDER — HYDRALAZINE HCL 20 MG/ML IJ SOLN: 5.0000 mg | INTRAMUSCULAR | Status: DC | PRN

## 2023-09-17 MED ORDER — HEPARIN SODIUM (PORCINE) 1000 UNIT/ML IJ SOLN
INTRAMUSCULAR | Status: AC
  Filled 2023-09-17: qty 10

## 2023-09-17 MED ORDER — PROPOFOL 10 MG/ML IV BOLUS
INTRAVENOUS | Status: DC | PRN
  Administered 2023-09-17: 90 mg via INTRAVENOUS
  Administered 2023-09-17: 175 ug/kg/min via INTRAVENOUS

## 2023-09-17 MED ORDER — METOPROLOL TARTRATE 5 MG/5ML IV SOLN: 2.0000 mg | INTRAVENOUS | Status: DC | PRN

## 2023-09-17 MED ORDER — OXYCODONE HCL 5 MG/5ML PO SOLN: 5.0000 mg | Freq: Once | ORAL | Status: DC | PRN

## 2023-09-17 MED ORDER — FENTANYL CITRATE (PF) 100 MCG/2ML IJ SOLN
25.0000 ug | INTRAMUSCULAR | Status: DC | PRN
  Administered 2023-09-17: 25 ug via INTRAVENOUS

## 2023-09-17 MED ORDER — ROCURONIUM BROMIDE 10 MG/ML (PF) SYRINGE
PREFILLED_SYRINGE | INTRAVENOUS | Status: AC
  Filled 2023-09-17: qty 10

## 2023-09-17 MED ORDER — HYDROMORPHONE HCL 1 MG/ML IJ SOLN: 0.5000 mg | INTRAMUSCULAR | Status: DC | PRN

## 2023-09-17 MED ORDER — GLYCOPYRROLATE 0.2 MG/ML IJ SOLN
INTRAMUSCULAR | Status: DC | PRN
  Administered 2023-09-17 (×2): .2 mg via INTRAVENOUS

## 2023-09-17 MED ORDER — 0.9 % SODIUM CHLORIDE (POUR BTL) OPTIME
TOPICAL | Status: DC | PRN
  Administered 2023-09-17: 1000 mL

## 2023-09-17 MED ORDER — LIDOCAINE 2% (20 MG/ML) 5 ML SYRINGE
INTRAMUSCULAR | Status: AC
  Filled 2023-09-17: qty 5

## 2023-09-17 MED ORDER — CHLORHEXIDINE GLUCONATE 0.12 % MT SOLN: 15.0000 mL | Freq: Once | OROMUCOSAL | Status: AC

## 2023-09-17 MED ORDER — OXYCODONE HCL 5 MG PO TABS: 5.0000 mg | ORAL_TABLET | Freq: Once | ORAL | Status: DC | PRN

## 2023-09-17 MED ORDER — HEMOSTATIC AGENTS (NO CHARGE) OPTIME
TOPICAL | Status: DC | PRN
Start: 2023-09-17 — End: 2023-09-17
  Administered 2023-09-17: 1 via TOPICAL

## 2023-09-17 MED ORDER — PROTAMINE SULFATE 10 MG/ML IV SOLN
INTRAVENOUS | Status: DC | PRN
  Administered 2023-09-17: 50 mg via INTRAVENOUS

## 2023-09-17 MED ORDER — MIDODRINE HCL 5 MG PO TABS
5.0000 mg | ORAL_TABLET | Freq: Three times a day (TID) | ORAL | Status: DC
  Administered 2023-09-18 (×2): 5 mg via ORAL
  Filled 2023-09-17 (×2): qty 1

## 2023-09-17 MED ORDER — SODIUM CHLORIDE 0.9 % IV SOLN: INTRAVENOUS | Status: AC

## 2023-09-17 MED ORDER — LIDOCAINE 2% (20 MG/ML) 5 ML SYRINGE
INTRAMUSCULAR | Status: DC | PRN
Start: 2023-09-17 — End: 2023-09-17
  Administered 2023-09-17: 60 mg via INTRAVENOUS

## 2023-09-17 MED ORDER — ROCURONIUM BROMIDE 10 MG/ML (PF) SYRINGE
PREFILLED_SYRINGE | INTRAVENOUS | Status: DC | PRN
  Administered 2023-09-17: 50 mg via INTRAVENOUS

## 2023-09-17 MED ORDER — SUGAMMADEX SODIUM 200 MG/2ML IV SOLN
INTRAVENOUS | Status: DC | PRN
  Administered 2023-09-17: 200 mg via INTRAVENOUS

## 2023-09-17 MED ORDER — FENTANYL CITRATE (PF) 250 MCG/5ML IJ SOLN
INTRAMUSCULAR | Status: AC
  Filled 2023-09-17: qty 5

## 2023-09-17 MED ORDER — ONDANSETRON HCL 4 MG/2ML IJ SOLN
INTRAMUSCULAR | Status: DC | PRN
  Administered 2023-09-17: 4 mg via INTRAVENOUS

## 2023-09-17 MED ORDER — HEPARIN SODIUM (PORCINE) 1000 UNIT/ML IJ SOLN
INTRAMUSCULAR | Status: DC | PRN
Start: 2023-09-17 — End: 2023-09-17
  Administered 2023-09-17: 2000 [IU] via INTRAVENOUS
  Administered 2023-09-17: 6000 [IU] via INTRAVENOUS

## 2023-09-17 MED ORDER — SODIUM CHLORIDE 0.9 % IV SOLN
INTRAVENOUS | Status: DC | PRN
Start: 2023-09-17 — End: 2023-09-17

## 2023-09-17 MED ORDER — PHENYLEPHRINE 80 MCG/ML (10ML) SYRINGE FOR IV PUSH (FOR BLOOD PRESSURE SUPPORT)
PREFILLED_SYRINGE | INTRAVENOUS | Status: AC
  Filled 2023-09-17: qty 10

## 2023-09-17 MED ORDER — LABETALOL HCL 5 MG/ML IV SOLN
INTRAVENOUS | Status: AC
  Filled 2023-09-17: qty 4

## 2023-09-17 MED ORDER — ONDANSETRON HCL 4 MG/2ML IJ SOLN: 4.0000 mg | Freq: Once | INTRAMUSCULAR | Status: DC | PRN

## 2023-09-17 MED ORDER — GLYCOPYRROLATE PF 0.2 MG/ML IJ SOSY
PREFILLED_SYRINGE | INTRAMUSCULAR | Status: AC
  Filled 2023-09-17: qty 1

## 2023-09-17 MED ORDER — HEPARIN 6000 UNIT IRRIGATION SOLUTION
Status: DC | PRN
  Administered 2023-09-17: 1

## 2023-09-17 MED ORDER — DOCUSATE SODIUM 100 MG PO CAPS
100.0000 mg | ORAL_CAPSULE | Freq: Every day | ORAL | Status: DC
  Administered 2023-09-18: 100 mg via ORAL
  Filled 2023-09-17: qty 1

## 2023-09-17 MED ORDER — SODIUM CHLORIDE 0.9 % IV SOLN
0.0125 ug/kg/min | Freq: Once | INTRAVENOUS | Status: AC
  Administered 2023-09-17: .1 ug/kg/min via INTRAVENOUS
  Filled 2023-09-17: qty 2000

## 2023-09-17 MED ORDER — FENTANYL CITRATE (PF) 100 MCG/2ML IJ SOLN
INTRAMUSCULAR | Status: AC
  Filled 2023-09-17: qty 2

## 2023-09-17 MED ORDER — CEFAZOLIN SODIUM-DEXTROSE 2-4 GM/100ML-% IV SOLN
2.0000 g | Freq: Three times a day (TID) | INTRAVENOUS | Status: AC
  Administered 2023-09-17 – 2023-09-18 (×2): 2 g via INTRAVENOUS
  Filled 2023-09-17 (×2): qty 100

## 2023-09-17 MED ORDER — FENTANYL CITRATE (PF) 250 MCG/5ML IJ SOLN
INTRAMUSCULAR | Status: DC | PRN
  Administered 2023-09-17: 50 ug via INTRAVENOUS
  Administered 2023-09-17: 25 ug via INTRAVENOUS
  Administered 2023-09-17: 50 ug via INTRAVENOUS

## 2023-09-17 MED ORDER — SODIUM CHLORIDE 0.9 % IV SOLN
500.0000 mL | Freq: Once | INTRAVENOUS | Status: AC | PRN
  Administered 2023-09-17: 500 mL via INTRAVENOUS

## 2023-09-17 MED ORDER — PHENYLEPHRINE HCL-NACL 20-0.9 MG/250ML-% IV SOLN
INTRAVENOUS | Status: DC | PRN
  Administered 2023-09-17: 50 ug/min via INTRAVENOUS

## 2023-09-17 MED ORDER — LABETALOL HCL 5 MG/ML IV SOLN: 10.0000 mg | INTRAVENOUS | Status: DC | PRN

## 2023-09-17 MED ORDER — CEFAZOLIN SODIUM 1 G IJ SOLR
INTRAMUSCULAR | Status: AC
  Filled 2023-09-17: qty 20

## 2023-09-17 MED ORDER — SODIUM CHLORIDE (PF) 0.9 % IJ SOLN
INTRAMUSCULAR | Status: AC
Start: 2023-09-17 — End: ?
  Filled 2023-09-17: qty 20

## 2023-09-17 MED ORDER — CLOPIDOGREL BISULFATE 75 MG PO TABS
75.0000 mg | ORAL_TABLET | Freq: Every day | ORAL | Status: DC
  Administered 2023-09-18: 75 mg via ORAL
  Filled 2023-09-17: qty 1

## 2023-09-17 MED ORDER — LACTATED RINGERS IV SOLN: INTRAVENOUS | Status: DC

## 2023-09-17 MED ORDER — DEXAMETHASONE SODIUM PHOSPHATE 10 MG/ML IJ SOLN
INTRAMUSCULAR | Status: AC
  Filled 2023-09-17: qty 1

## 2023-09-17 MED ORDER — DEXAMETHASONE SODIUM PHOSPHATE 10 MG/ML IJ SOLN
INTRAMUSCULAR | Status: DC | PRN
  Administered 2023-09-17: 5 mg via INTRAVENOUS

## 2023-09-17 MED ORDER — OXYCODONE-ACETAMINOPHEN 5-325 MG PO TABS
1.0000 | ORAL_TABLET | ORAL | Status: DC | PRN
  Administered 2023-09-17: 1 via ORAL
  Filled 2023-09-17: qty 1

## 2023-09-17 MED ORDER — ORAL CARE MOUTH RINSE: 15.0000 mL | Freq: Once | OROMUCOSAL | Status: AC

## 2023-09-17 MED ORDER — EPHEDRINE 5 MG/ML INJ
INTRAVENOUS | Status: AC
  Filled 2023-09-17: qty 5

## 2023-09-17 MED ORDER — LACTATED RINGERS IV BOLUS
1000.0000 mL | Freq: Once | INTRAVENOUS | Status: AC
  Administered 2023-09-17: 1000 mL via INTRAVENOUS

## 2023-09-17 MED ORDER — EPHEDRINE SULFATE-NACL 50-0.9 MG/10ML-% IV SOSY
PREFILLED_SYRINGE | INTRAVENOUS | Status: DC | PRN
  Administered 2023-09-17 (×2): 5 mg via INTRAVENOUS

## 2023-09-17 MED ORDER — HYDROMORPHONE HCL 1 MG/ML IJ SOLN: 0.2500 mg | INTRAMUSCULAR | Status: DC | PRN

## 2023-09-17 MED ORDER — ATROPINE SULFATE 0.4 MG/ML IV SOLN
INTRAVENOUS | Status: DC | PRN
  Administered 2023-09-17: .4 mg via INTRAVENOUS

## 2023-09-17 MED ORDER — CHLORHEXIDINE GLUCONATE 0.12 % MT SOLN
OROMUCOSAL | Status: AC
  Administered 2023-09-17: 15 mL via OROMUCOSAL
  Filled 2023-09-17: qty 15

## 2023-09-17 SURGICAL SUPPLY — 41 items
BAG BANDED W/RUBBER/TAPE 36X54 (MISCELLANEOUS) ×3 IMPLANT
BAG COUNTER SPONGE SURGICOUNT (BAG) ×3 IMPLANT
CANISTER SUCT 3000ML PPV (MISCELLANEOUS) ×3 IMPLANT
CATH BALLN ENROUTE 5X25 (CATHETERS) IMPLANT
CLIP TI MEDIUM 6 (CLIP) ×3 IMPLANT
CLIP TI WIDE RED SMALL 6 (CLIP) ×3 IMPLANT
COVER DOME SNAP 22 D (MISCELLANEOUS) ×3 IMPLANT
COVER PROBE W GEL 5X96 (DRAPES) ×3 IMPLANT
DERMABOND ADVANCED .7 DNX12 (GAUZE/BANDAGES/DRESSINGS) ×3 IMPLANT
DRAPE FEMORAL ANGIO 80X135IN (DRAPES) ×3 IMPLANT
ELECT REM PT RETURN 9FT ADLT (ELECTROSURGICAL) ×2 IMPLANT
ELECTRODE REM PT RTRN 9FT ADLT (ELECTROSURGICAL) ×3 IMPLANT
GLOVE BIOGEL PI IND STRL 7.0 (GLOVE) ×3 IMPLANT
GOWN STRL REUS W/ TWL LRG LVL3 (GOWN DISPOSABLE) ×6 IMPLANT
GOWN STRL REUS W/ TWL XL LVL3 (GOWN DISPOSABLE) ×3 IMPLANT
GUIDEWIRE ENROUTE 0.014 (WIRE) ×3 IMPLANT
HEMOSTAT SNOW SURGICEL 2X4 (HEMOSTASIS) IMPLANT
KIT BASIN OR (CUSTOM PROCEDURE TRAY) ×3 IMPLANT
KIT ENCORE 26 ADVANTAGE (KITS) ×3 IMPLANT
KIT INTRODUCER GALT 7 (INTRODUCER) ×3 IMPLANT
KIT TURNOVER KIT B (KITS) ×3 IMPLANT
PACK CAROTID (CUSTOM PROCEDURE TRAY) ×3 IMPLANT
POSITIONER HEAD DONUT 9IN (MISCELLANEOUS) ×3 IMPLANT
POWDER SURGICEL 3.0 GRAM (HEMOSTASIS) IMPLANT
SET MICROPUNCTURE 5F STIFF (MISCELLANEOUS) ×3 IMPLANT
STENT TRANSCAROTID 8-6X40 (Permanent Stent) IMPLANT
SUT MNCRL AB 4-0 PS2 18 (SUTURE) ×3 IMPLANT
SUT PROLENE 5 0 C 1 24 (SUTURE) IMPLANT
SUT PROLENE 6 0 BV (SUTURE) ×3 IMPLANT
SUT SILK 2 0 PERMA HAND 18 BK (SUTURE) ×3 IMPLANT
SUT SILK 3-0 18XBRD TIE 12 (SUTURE) IMPLANT
SUT VIC AB 2-0 CT1 TAPERPNT 27 (SUTURE) ×3 IMPLANT
SUT VIC AB 3-0 SH 27X BRD (SUTURE) ×3 IMPLANT
SYR 10ML LL (SYRINGE) ×9 IMPLANT
SYR 20ML LL LF (SYRINGE) ×3 IMPLANT
SYR CONTROL 10ML LL (SYRINGE) IMPLANT
SYSTEM TRANSCAROTID NEUROPRTCT (MISCELLANEOUS) ×3 IMPLANT
TOWEL GREEN STERILE (TOWEL DISPOSABLE) ×3 IMPLANT
TRANSCAROTID NEUROPROTECT SYS (MISCELLANEOUS) ×2 IMPLANT
WATER STERILE IRR 1000ML POUR (IV SOLUTION) ×3 IMPLANT
WIRE BENTSON .035X145CM (WIRE) ×3 IMPLANT

## 2023-09-17 NOTE — Plan of Care (Signed)
  Problem: Education: Goal: Knowledge of disease or condition will improve Outcome: Progressing Goal: Knowledge of secondary prevention will improve (MUST DOCUMENT ALL) Outcome: Progressing Goal: Knowledge of patient specific risk factors will improve (DELETE if not current risk factor) Outcome: Progressing   Problem: Self-Care: Goal: Ability to participate in self-care as condition permits will improve Outcome: Progressing Goal: Verbalization of feelings and concerns over difficulty with self-care will improve Outcome: Progressing Goal: Ability to communicate needs accurately will improve Outcome: Progressing   Problem: Nutrition: Goal: Risk of aspiration will decrease Outcome: Progressing Goal: Dietary intake will improve Outcome: Progressing   Problem: Clinical Measurements: Goal: Ability to maintain clinical measurements within normal limits will improve Outcome: Progressing Goal: Will remain free from infection Outcome: Progressing Goal: Diagnostic test results will improve Outcome: Progressing Goal: Respiratory complications will improve Outcome: Progressing Goal: Cardiovascular complication will be avoided Outcome: Progressing   Problem: Elimination: Goal: Will not experience complications related to bowel motility Outcome: Progressing Goal: Will not experience complications related to urinary retention Outcome: Progressing   Problem: Safety: Goal: Ability to remain free from injury will improve Outcome: Progressing   Problem: Pain Managment: Goal: General experience of comfort will improve and/or be controlled Outcome: Progressing

## 2023-09-17 NOTE — Plan of Care (Signed)
  Problem: Ischemic Stroke/TIA Tissue Perfusion: Goal: Complications of ischemic stroke/TIA will be minimized 09/17/2023 0603 by Francena Infield, RN Outcome: Progressing 09/17/2023 0525 by Francena Infield, RN Outcome: Progressing   Problem: Education: Goal: Knowledge of disease or condition will improve 09/17/2023 0603 by Francena Infield, RN Outcome: Progressing 09/17/2023 0525 by Francena Infield, RN Outcome: Progressing Goal: Knowledge of secondary prevention will improve (MUST DOCUMENT ALL) 09/17/2023 0603 by Francena Infield, RN Outcome: Progressing 09/17/2023 0525 by Francena Infield, RN Outcome: Progressing Goal: Knowledge of patient specific risk factors will improve (DELETE if not current risk factor) 09/17/2023 0603 by Francena Infield, RN Outcome: Progressing 09/17/2023 0525 by Francena Infield, RN Outcome: Progressing   Problem: Coping: Goal: Will verbalize positive feelings about self 09/17/2023 0603 by Francena Infield, RN Outcome: Progressing 09/17/2023 0525 by Francena Infield, RN Outcome: Progressing Goal: Will identify appropriate support needs 09/17/2023 0603 by Francena Infield, RN Outcome: Progressing 09/17/2023 0525 by Francena Infield, RN Outcome: Progressing

## 2023-09-17 NOTE — Progress Notes (Signed)
 BP has improved, MD was at bedside, pt was asymptomatic.   09/17/23 1827  Vitals  BP (!) 118/55  MAP (mmHg) 73  BP Location Left Arm  BP Method Automatic  Patient Position (if appropriate) Lying  Pulse Rate 60  Pulse Rate Source Monitor  ECG Heart Rate 60  Resp 20  Level of Consciousness  Level of Consciousness Alert  MEWS COLOR  MEWS Score Color Green  Oxygen Therapy  SpO2 100 %  O2 Device Nasal Cannula  O2 Flow Rate (L/min) 2 L/min  Art Line  Arterial Line BP 118/49  Arterial Line MAP (mmHg) 71 mmHg  MEWS Score  MEWS Temp 0  MEWS Systolic 0  MEWS Pulse 0  MEWS RR 0  MEWS LOC 0  MEWS Score 0

## 2023-09-17 NOTE — Progress Notes (Signed)
 Pt received from PACU, Vital's obtained, CCMD notified, CHG bath given, call bell in reach, all the needs met.   09/17/23 1224  Vitals  BP (!) 116/58  MAP (mmHg) 72  BP Location Left Arm  BP Method Automatic  Patient Position (if appropriate) Lying  Pulse Rate 61  Pulse Rate Source Monitor  ECG Heart Rate 63  Resp 17  Level of Consciousness  Level of Consciousness Alert  MEWS COLOR  MEWS Score Color Green  Oxygen Therapy  SpO2 97 %  O2 Device Nasal Cannula  O2 Flow Rate (L/min) 2 L/min  Art Line  Arterial Line BP 84/59  Arterial Line MAP (mmHg) 72 mmHg  Pain Assessment  Pain Scale 0-10  MEWS Score  MEWS Temp 0  MEWS Systolic 0  MEWS Pulse 0  MEWS RR 0  MEWS LOC 0  MEWS Score 0

## 2023-09-17 NOTE — Progress Notes (Signed)
 [facility]                                                                               Jacqueline Burke, is a 88 y.o. female, DOB - 1935/11/28, ZOX:[id number] Admit date - 09/14/2023    Outpatient Primary MD for the patient is Shon Hale, MD  LOS - 2  days    Brief summary   Patient pleasant 88 year old female history of CAD, hypertension, hyperlipidemia, rheumatoid arthritis presented to the ED with sudden onset right visual field loss.  Patient seen by ophthalmologist and noted to have central retinal artery occlusion and sent to the ED for further evaluation.  Patient seen in the ED MRI/MRA head done concerning for acute CVA.  CT angiogram head and neck done with concerns for 50% stenosis at the origin of the right cervical ICA as well as stenosis at the ECA.  Neurology consulted and following.  Neurology consulted vascular for further evaluation due to concern for symptomatic carotid artery stenosis.     Assessment & Plan    Assessment and Plan:   Central retinal artery occlusion secondary to acute CVA from carotid artery stenosis Patient was admitted with sudden visual field loss in the right. Patient was seen by ophthalmology and noted to have a central retinal artery occlusion and sent to ED for further stroke workup She had an MRI and MRI of the head showing punctate acute/early subacute infarct in the right corona radiator CT angiogram of the head and neck showed 50% stenosis at the origin of the right cervical ICA 2D echo cardiogram reviewed.  Fasting lipid panel shows LDL of 57 and hemoglobin A1c of 5.8 She was seen by neurology and vascular surgeon. Vascular surgery on board and she underwent Right carotid stent placement ( tcar).  Meanwhile continue the patient on aspirin, Plavix and Lipitor   AKI Resolved with IV fluids Creatinine wnl.    Hypertension Blood pressure parameters are well controlled.  Continue with Norvasc and Metoprolol.     Hypomagnesemia Replaced. Repeat level wnl.     History of rheumatoid arthritis Continue with home medications  and follow-up with rheumatologist     Estimated body mass index is 24.02 kg/m as calculated from the following:   Height as of this encounter: 5' (1.524 m).   Weight as of this encounter: 55.8 kg.  Code Status: full code.  DVT Prophylaxis:  SCD's Start: 09/17/23 1227 enoxaparin (LOVENOX) injection 30 mg Start: 09/14/23 1800   Level of Care: Level of care: Progressive Family Communication: Updated patient's family at bedside.   Disposition Plan:     Remains inpatient appropriate:  Possible d.c in am.   Procedures:  TCAR ON 4/17  Consultants:   Neurology Vascular surgery.   Antimicrobials:   Anti-infectives (From admission, onward)    Start     Dose/Rate Route Frequency Ordered Stop   09/17/23 1600  ceFAZolin (ANCEF) IVPB 2g/100 mL premix        2 g 200 mL/hr over 30 Minutes Intravenous Every 8 hours 09/17/23 1226 09/18/23 0759        Medications  Scheduled Meds:  acetaminophen  325 mg Oral QHS  aspirin EC  81 mg Oral Daily   atorvastatin  80 mg Oral QPM   cholecalciferol  3,000 Units Oral Daily   [START ON 09/18/2023] clopidogrel  75 mg Oral Daily   cyanocobalamin  5,000 mcg Oral Daily   [START ON 09/18/2023] docusate sodium  100 mg Oral Daily   enoxaparin (LOVENOX) injection  30 mg Subcutaneous Q24H   fentaNYL       loratadine  10 mg Oral Daily   metoprolol tartrate  12.5 mg Oral BID   [START ON 09/18/2023] pantoprazole  40 mg Oral Daily   Continuous Infusions:  sodium chloride 75 mL/hr at 09/17/23 1243    ceFAZolin (ANCEF) IV     PRN Meds:.acetaminophen **OR** acetaminophen (TYLENOL) oral liquid 160 mg/5 mL **OR** acetaminophen, albuterol, fentaNYL, fluticasone, gabapentin, hydrALAZINE, HYDROmorphone (DILAUDID) injection, labetalol, metoprolol tartrate, nitroGLYCERIN, ondansetron (ZOFRAN) IV, oxyCODONE-acetaminophen,  senna-docusate    Subjective:   Jacqueline Burke was seen and examined today.  No new complaints.   Objective:   Vitals:   09/17/23 1314 09/17/23 1322 09/17/23 1354 09/17/23 1415  BP:    126/61  Pulse: 68 61 70 67  Resp:   17 16  Temp:      TempSrc:      SpO2: 100% 100% 93% 93%  Weight:      Height:        Intake/Output Summary (Last 24 hours) at 09/17/2023 1510 Last data filed at 09/17/2023 1247 Gross per 24 hour  Intake 350 ml  Output 150 ml  Net 200 ml   Filed Weights   09/16/23 0533 09/17/23 0524 09/17/23 0645  Weight: 57.3 kg 55.8 kg 55.8 kg     Exam General exam: Appears calm and comfortable  Respiratory system: Clear to auscultation. Respiratory effort normal. Cardiovascular system: S1 & S2 heard, RRR. No JVD, Gastrointestinal system: Abdomen is nondistended, soft and nontender.  Central nervous system: Alert and oriented. Skin: No rashes, Psychiatry: Mood & affect appropriate.    Data Reviewed:  I have personally reviewed following labs and imaging studies   CBC Lab Results  Component Value Date   WBC 8.0 09/16/2023   RBC 3.74 (L) 09/16/2023   HGB 11.1 (L) 09/16/2023   HCT 33.7 (L) 09/16/2023   MCV 90.1 09/16/2023   MCH 29.7 09/16/2023   PLT 268 09/16/2023   MCHC 32.9 09/16/2023   RDW 15.1 09/16/2023   LYMPHSABS 2.0 09/14/2023   MONOABS 0.7 09/14/2023   EOSABS 0.2 09/14/2023   BASOSABS 0.1 09/14/2023     Last metabolic panel Lab Results  Component Value Date   NA 140 09/16/2023   K 3.9 09/16/2023   CL 107 09/16/2023   CO2 24 09/16/2023   BUN 14 09/16/2023   CREATININE 0.99 09/16/2023   GLUCOSE 86 09/16/2023   GFRNONAA 56 (L) 09/16/2023   GFRAA 56 (L) 07/17/2017   CALCIUM 9.3 09/16/2023   PHOS 4.2 09/16/2023   PROT 7.2 09/14/2023   ALBUMIN 3.5 09/16/2023   LABGLOB 3.1 07/22/2022   BILITOT 0.5 09/14/2023   ALKPHOS 38 09/14/2023   AST 20 09/14/2023   ALT 15 09/14/2023   ANIONGAP 9 09/16/2023    CBG (last 3)  Recent Labs     09/14/23 1521 09/17/23 0601  GLUCAP 92 84      Coagulation Profile: Recent Labs  Lab 09/14/23 1520  INR 1.0     Radiology Studies: HYBRID OR IMAGING ([facility] ONLY) Result Date: 09/17/2023 There is no interpretation for this exam.  This order  is for images obtained during a surgical procedure.  Please See "Surgeries" Tab for more information regarding the procedure.       Feliciana Horn M.D. [facility] 09/17/2023, 3:10 PM  Available via Epic secure chat 7am-7pm After 7 pm, please refer to night coverage provider listed on amion.

## 2023-09-17 NOTE — Transfer of Care (Signed)
 Immediate Anesthesia Transfer of Care Note  Patient: Grissel S Merta  Procedure(s) Performed: TRANSCAROTID ARTERY REVASCULARIZATION (TCAR) (Right: Neck) ULTRASOUND GUIDANCE, FOR VASCULAR ACCESS (Left: Groin)  Patient Location: PACU  Anesthesia Type:General  Level of Consciousness: awake and alert   Airway & Oxygen Therapy: Patient Spontanous Breathing and Patient connected to face mask oxygen  Post-op Assessment: Report given to RN, Post -op Vital signs reviewed and stable, and Patient moving all extremities X 4  Post vital signs: Reviewed and stable  Last Vitals:  Vitals Value Taken Time  BP 117/99 09/17/23 0938  Temp 36.3 09/17/23 0938    Pulse 76 09/17/23 0943  Resp 16 09/17/23 0943  SpO2 91 % 09/17/23 0943  Vitals shown include unfiled device data.  Last Pain:  Vitals:   09/17/23 0645  TempSrc:   PainSc: 0-No pain         Complications: No notable events documented. Pt was slow to follow commands, and briefly confused when wakening. Surgeon  and MDA at bedside. Pt awake, and slowly following commands. SBPbetween 100-141mmHg per surgeon request.

## 2023-09-17 NOTE — Anesthesia Procedure Notes (Addendum)
 Procedure Name: Intubation Date/Time: 09/17/2023 7:44 AM  Performed by: Delma Fern, CRNAPre-anesthesia Checklist: Patient identified, Emergency Drugs available, Suction available and Patient being monitored Patient Re-evaluated:Patient Re-evaluated prior to induction Oxygen Delivery Method: Circle System Utilized Preoxygenation: Pre-oxygenation with 100% oxygen Induction Type: IV induction Ventilation: Mask ventilation without difficulty Laryngoscope Size: Miller and 2 Grade View: Grade I Tube type: Oral Number of attempts: 1 Airway Equipment and Method: Stylet and Oral airway Placement Confirmation: ETT inserted through vocal cords under direct vision, positive ETCO2 and breath sounds checked- equal and bilateral Secured at: 21 cm Tube secured with: Tape Dental Injury: Teeth and Oropharynx as per pre-operative assessment  Comments: By Ambrosio Junker with ease.

## 2023-09-17 NOTE — Anesthesia Postprocedure Evaluation (Signed)
 Anesthesia Post Note  Patient: Jacqueline Burke  Procedure(s) Performed: TRANSCAROTID ARTERY REVASCULARIZATION (TCAR) (Right: Neck) ULTRASOUND GUIDANCE, FOR VASCULAR ACCESS (Left: Groin)     Patient location during evaluation: PACU Anesthesia Type: General Level of consciousness: awake Pain management: pain level controlled Vital Signs Assessment: vitals unstable Respiratory status: spontaneous breathing, nonlabored ventilation and respiratory function stable Cardiovascular status: stable Postop Assessment: no apparent nausea or vomiting Anesthetic complications: no Comments: Requiring intermittent phenylephrine for BP   No notable events documented.  Last Vitals:  Vitals:   09/17/23 1015 09/17/23 1030  BP: 102/65 96/77  Pulse: 65 68  Resp: 20 13  Temp:    SpO2: 97% 100%    Last Pain:  Vitals:   09/17/23 0930  TempSrc:   PainSc: 0-No pain    LLE Motor Response: Purposeful movement (09/17/23 1030)   RLE Motor Response: Purposeful movement (09/17/23 1030)        Phung Kotas A.

## 2023-09-17 NOTE — Progress Notes (Signed)
  Progress Note    09/17/2023 7:14 AM * Day of Surgery *  Subjective:  no complaints   Vitals:   09/17/23 0354 09/17/23 0645  BP: (!) 154/94 (!) 143/76  Pulse: 65 (!) 54  Resp: 15 17  Temp: 98.4 F (36.9 C) 98.1 F (36.7 C)  SpO2: 91% 95%   Physical Exam: Lungs:  non labored Extremities:  moving all ext well Neurologic: CN grossly intact; R eye blindness  CBC    Component Value Date/Time   WBC 8.0 09/16/2023 0551   RBC 3.74 (L) 09/16/2023 0551   HGB 11.1 (L) 09/16/2023 0551   HGB 9.7 (L) 09/05/2022 0918   HGB 11.5 02/24/2018 0918   HCT 33.7 (L) 09/16/2023 0551   HCT 34.4 02/24/2018 0918   PLT 268 09/16/2023 0551   PLT 349 09/05/2022 0918   PLT 256 02/24/2018 0918   MCV 90.1 09/16/2023 0551   MCV 91 02/24/2018 0918   MCH 29.7 09/16/2023 0551   MCHC 32.9 09/16/2023 0551   RDW 15.1 09/16/2023 0551   RDW 13.8 02/24/2018 0918   LYMPHSABS 2.0 09/14/2023 1520   MONOABS 0.7 09/14/2023 1520   EOSABS 0.2 09/14/2023 1520   BASOSABS 0.1 09/14/2023 1520    BMET    Component Value Date/Time   NA 140 09/16/2023 0551   NA 143 07/02/2017 0856   K 3.9 09/16/2023 0551   CL 107 09/16/2023 0551   CO2 24 09/16/2023 0551   GLUCOSE 86 09/16/2023 0551   BUN 14 09/16/2023 0551   BUN 24 07/02/2017 0856   CREATININE 0.99 09/16/2023 0551   CREATININE 0.78 09/05/2022 0918   CALCIUM 9.3 09/16/2023 0551   CALCIUM 9.9 07/25/2022 1000   GFRNONAA 56 (L) 09/16/2023 0551   GFRNONAA >60 09/05/2022 0918   GFRAA 56 (L) 07/17/2017 0925    INR    Component Value Date/Time   INR 1.0 09/14/2023 1520    No intake or output data in the 24 hours ending 09/17/23 0714    Assessment/Plan:  88 y.o. female with symptomatic right ICA stenosis  R TCAR today.  Philipp Brawn MD [facility] Phone Number: [phone] 09/17/2023 7:15 AM

## 2023-09-17 NOTE — Progress Notes (Signed)
 Pt's BP is on soft side,  informed to the attending, IV bolus started as per the order, incision site is level 0, NIHS 0, only has mild lightheadedness, paged vascular surgeon as well,  Rapid response notified for second opinion.   09/17/23 1700  Vitals  Temp 97.9 F (36.6 C)  Temp Source Oral  BP (!) 74/48  MAP (mmHg) (!) 58  BP Location Right Arm  BP Method Automatic  Patient Position (if appropriate) Lying  Pulse Rate (!) 58  Pulse Rate Source Monitor  ECG Heart Rate 62  Resp 18  Level of Consciousness  Level of Consciousness Alert  Oxygen Therapy  SpO2 91 %  O2 Device Nasal Cannula  O2 Flow Rate (L/min) 2 L/min  Art Line  Arterial Line BP 94/46  Arterial Line MAP (mmHg) 61 mmHg

## 2023-09-17 NOTE — Addendum Note (Signed)
 Addendum  created 09/17/23 1051 by Delma Fern, CRNA   Intraprocedure Event edited

## 2023-09-17 NOTE — Op Note (Signed)
 OPERATIVE NOTE  PROCEDURE:   Right carotid stent placement (TCAR) Flow reversal embolic neuro-protection Ultrasound-guided Left femoral vein access  PRE-OPERATIVE DIAGNOSIS: symptomatic right carotid artery stenosis  POST-OPERATIVE DIAGNOSIS: same as above   SURGEON: Daria Pastures MD  ASSISTANT(S): Aggie Moats, [facility]  Given the complexity of the case,  the assistant was necessary in order to expedient the procedure and safely perform the technical aspects of the operation.  The assistant provided traction and countertraction while inserting the TCAR sheath. The assistant also played a critical role in pinning wires while advancing balloons and the stent across the critical stenosis. These skills could not have been adequately performed by a scrub tech assistant.    ANESTHESIA: general  ESTIMATED BLOOD LOSS: 20 cc  FINDING(S): Approximate 60% stenosis of proximal right ICA Wide patency with brisk flow after stent placement  SPECIMEN(S):  none  INDICATIONS:   Jacqueline Burke is a 88 y.o. female who presents with right symptomatic carotid stenosis 60%.  I discussed with the patient the risks, benefits, medical management, carotid endarterectomy and TCAR.  The patient is a candidate for TCAR. I discussed the procedural details of both carotid endarterectomy and TCAR with the patient and recommended TCAR based on his anatomy and medical comorbidity's.  The patient is aware that the risks of TCAR include but are not limited to: bleeding, infection, stroke, myocardial infarction, death, cranial nerve injuries both temporary and permanent, neck hematoma, possible airway compromise, labile blood pressure post-operatively, cerebral hyperperfusion syndrome, and possible need for additional interventions in the future. The patient is aware of the risks and agrees to proceed forward with the procedure.  DESCRIPTION: The patient was transferred to the operating room and positioned supine on  the operating room table. Anesthesia was induced. The neck and groins were prepped and draped in standard fashion. A surgical timeout was performed confirming correct patient, procedure, and operative location.  The left common femoral vein (CFV) was accessed under ultrasound guidance, using standard Seldinger and micropuncture access technique. Permanent recorded image(s) was/were saved in the patient's medical record. The Venous Return Sheath was advanced into the CFV over the 0.035" wire provided. Blood was aspirated from the flow line followed by flushing of the Venous Sheath with heparinized saline. The Venous Sheath was secured to the patient's skin with suture to maintain optimal position in the vessel.   Using intraoperative ultrasound the course of the right common carotid artery was mapped on the skin. A longitudinal 3-4 cm incision was made between the sternal and clavicular heads of the sternocleidomastoid muscle. The omohyoid was transected. Following longitudinal division of the carotid sheath the jugular vein was partially dissected and retracted laterally. Once 3 cm of common carotid artery (CCA) were isolated, umbilical tape was placed around the proximal 1/3 of the CCA under direct vision. A vessel loop was also placed around the artery just proximal to the umbilical tape in Pots fashion. A 6-0 polypropylene suture was pre-placed in the anterior wall of the CCA, in a "U stitch" configuration, close to the clavicle to facilitate hemostasis upon removal of the arterial sheath at completion of the TCAR procedure.  Heparin was given to obtain a therapeutic activated clotting time >250 seconds prior to arterial access. A 4-French non-stiffened ENHANCE Transcarotid / Peripheral Access set was used, puncturing the artery with the 21G needle through the pre-placed "U" stitch while holding gentle traction on the umbilical tape to stabilize and centralize the CCA within the incision. Careful attention  was paid to the change in CCA shape when using the umbilical tape to control or lift the artery. The micropuncture wire was then advanced 3-4 cm into the CCA and, the 21G needle was removed. The micropuncture sheath was advanced 2-3 cm into the CCA and the wire and dilator were removed. Pulsatile backflow indicated correct positioning. The provided 0.035" J-tipped guidewire was inserted as close as possible to the bifurcation without engaging the lesion. After micropuncture sheath removal, the Transcarotid Arterial Sheath was advanced to the 2.5cm marker and the 0.035" wire and dilator were then removed. Arterial Sheath position was assessed under fluoroscopy in two projections to ensure that the sheath tip was oriented coaxially in the CCA. The Arterial Sheath was sutured to the patient with gentle forward tension. Blood was slowly aspirated followed by flushing with heparinized saline. No ingress of air bubbles through the passive hemostatic valve was observed. The stopcocks were closed. Traction applied to the CCA previously to facilitate access was gently released.   The Flow Controller was connected to the Transcarotid Arterial Sheath, prepared by passively allowing a column of arterial blood to fill the line and connected to the Venous Return Sheath. Passive flow reversal was confirmed with a saline bolus deliver into the venous flow line on the "Low" flow setting of the Flow Controller. The flow setting was switched to "High" and the CCA inflow was occluded proximal to the arteriotomy with the vessel loop to achieve active flow reversal. To confirm active flow reversal, a saline bolus was delivered into the venous flow line on the "High" flow setting of the Flow Controller. Angiograms were performed with slow injections of a small amount of contrast filling just past the lesion to minimize antegrade transmission of micro-bubbles.  Prior to lesion manipulation, heart rate (70bpm) and systolic BP (90-110  MAP) were managed upwards to optimize flow reversal and procedural neuroprotection. The lesion was crossed with an 0.014" ENROUTE guidewire and pre-dilation of the lesion was performed with a 5mm x 25mm rapid exchange 0.014" compatible balloon catheter to 8 atmospheres. Stenting was performed with an 8-96mm x 40mm ENROUTE Transcarotid stent, sized appropriately to the CCA. AP and lateral angiograms (gentle contrast injections) were performed to confirm stent placement and arterial wall stent apposition.  At [facility] case completion, antegrade flow was restored by releasing the vessel loop on the CCA then closing the NPS stopcocks to the flow lines. The Transcarotid Arterial Sheath was removed and the pre-closure suture was tied. Heparin was reversed, the wound was copiously irrigated and hemostasis achieved. The heads of the SCM was re-approximated with a 2-0 Vicryl, the platysma was closed with a 3-0 Vicryl and the skin with 4-0 Monocryl.   The Venous Return Sheath was removed and hemostasis was achieved with brief manual compression.  The patient tolerated the procedure well and was extubated on the table. The patient was moving all four extremities to command prior to transfer to the recovery room.    COMPLICATIONS: none apparent  CONDITION: good  Philipp Brawn MD [facility] Phone Number: [phone] 09/17/2023 9:23 AM

## 2023-09-17 NOTE — Anesthesia Procedure Notes (Signed)
 Arterial Line Insertion Start/End4/17/2025 7:15 AM, 09/17/2023 7:30 AM Performed by: Tura Gaines, MD, Delma Fern, CRNA, CRNA  Patient location: Pre-op. Preanesthetic checklist: patient identified, IV checked, site marked, risks and benefits discussed, surgical consent, monitors and equipment checked, pre-op evaluation, timeout performed and anesthesia consent Lidocaine 1% used for infiltration radial was placed Catheter size: 20 G Hand hygiene performed , maximum sterile barriers used  and Seldinger technique used Allen's test indicative of satisfactory collateral circulation Attempts: 1 Procedure performed using ultrasound guided technique. Ultrasound Notes:anatomy identified, needle tip was noted to be adjacent to the nerve/plexus identified and no ultrasound evidence of intravascular and/or intraneural injection Following insertion, Biopatch and dressing applied. Post procedure assessment: normal and unchanged  Patient tolerated the procedure well with no immediate complications. Additional procedure comments: Performed by Blanche Bunker.

## 2023-09-17 NOTE — Progress Notes (Signed)
 06:30H  Patient left for OR via bed with OR staff. Telebox #[phone] removed

## 2023-09-18 ENCOUNTER — Encounter (HOSPITAL_COMMUNITY): Payer: Self-pay | Admitting: Vascular Surgery

## 2023-09-18 DIAGNOSIS — N179 Acute kidney failure, unspecified: Secondary | ICD-10-CM | POA: Diagnosis not present

## 2023-09-18 DIAGNOSIS — I639 Cerebral infarction, unspecified: Secondary | ICD-10-CM | POA: Diagnosis not present

## 2023-09-18 DIAGNOSIS — H3411 Central retinal artery occlusion, right eye: Secondary | ICD-10-CM | POA: Diagnosis not present

## 2023-09-18 LAB — BASIC METABOLIC PANEL WITH GFR
Anion gap: 7 (ref 5–15)
BUN: 9 mg/dL (ref 8–23)
CO2: 24 mmol/L (ref 22–32)
Calcium: 8.6 mg/dL — ABNORMAL LOW (ref 8.9–10.3)
Chloride: 110 mmol/L (ref 98–111)
Creatinine, Ser: 1.06 mg/dL — ABNORMAL HIGH (ref 0.44–1.00)
GFR, Estimated: 51 mL/min — ABNORMAL LOW (ref >60)
Glucose, Bld: 90 mg/dL (ref 70–99)
Potassium: 4.1 mmol/L (ref 3.5–5.1)
Sodium: 141 mmol/L (ref 135–145)

## 2023-09-18 LAB — CBC
HCT: 28.5 % — ABNORMAL LOW (ref 36.0–46.0)
Hemoglobin: 9.3 g/dL — ABNORMAL LOW (ref 12.0–15.0)
MCH: 29.7 pg (ref 26.0–34.0)
MCHC: 32.6 g/dL (ref 30.0–36.0)
MCV: 91.1 fL (ref 80.0–100.0)
Platelets: 204 10*3/uL (ref 150–400)
RBC: 3.13 MIL/uL — ABNORMAL LOW (ref 3.87–5.11)
RDW: 15.2 % (ref 11.5–15.5)
WBC: 7.6 10*3/uL (ref 4.0–10.5)
nRBC: 0 % (ref 0.0–0.2)

## 2023-09-18 MED ORDER — DOCUSATE SODIUM 100 MG PO CAPS: 100.0000 mg | ORAL_CAPSULE | Freq: Every day | ORAL | 0 refills | Status: AC

## 2023-09-18 MED ORDER — PANTOPRAZOLE SODIUM 40 MG PO TBEC: 40.0000 mg | DELAYED_RELEASE_TABLET | Freq: Every day | ORAL | 0 refills | Status: DC

## 2023-09-18 MED ORDER — ASPIRIN 81 MG PO TBEC: 81.0000 mg | DELAYED_RELEASE_TABLET | Freq: Every day | ORAL | 12 refills | Status: AC

## 2023-09-18 NOTE — Progress Notes (Addendum)
  Progress Note    09/18/2023 7:37 AM 1 Day Post-Op  Subjective:  sitting up in chair, says she is ready to go home. Some irritation on incision from her gown otherwise minimal pain. Tolerating diet. Ambulated in hallway    Vitals:   09/18/23 0102 09/18/23 0335  BP: (!) 114/51 (!) 122/52  Pulse: 64 61  Resp:  18  Temp:  98.1 F (36.7 C)  SpO2:  96%   Physical Exam: Cardiac:  regular Lungs:  non labored Incisions:  right neck incision is intact and well appearing, no swelling or hematoma. Dry gauze bandage placed to protect from gown Extremities:  moving all extremities without deficits Neurologic: alert and oriented. Speech coherent. Smile symmetric. Tongue midline  CBC    Component Value Date/Time   WBC 7.6 09/18/2023 0320   RBC 3.13 (L) 09/18/2023 0320   HGB 9.3 (L) 09/18/2023 0320   HGB 9.7 (L) 09/05/2022 0918   HGB 11.5 02/24/2018 0918   HCT 28.5 (L) 09/18/2023 0320   HCT 34.4 02/24/2018 0918   PLT 204 09/18/2023 0320   PLT 349 09/05/2022 0918   PLT 256 02/24/2018 0918   MCV 91.1 09/18/2023 0320   MCV 91 02/24/2018 0918   MCH 29.7 09/18/2023 0320   MCHC 32.6 09/18/2023 0320   RDW 15.2 09/18/2023 0320   RDW 13.8 02/24/2018 0918   LYMPHSABS 2.0 09/14/2023 1520   MONOABS 0.7 09/14/2023 1520   EOSABS 0.2 09/14/2023 1520   BASOSABS 0.1 09/14/2023 1520    BMET    Component Value Date/Time   NA 141 09/18/2023 0320   NA 143 07/02/2017 0856   K 4.1 09/18/2023 0320   CL 110 09/18/2023 0320   CO2 24 09/18/2023 0320   GLUCOSE 90 09/18/2023 0320   BUN 9 09/18/2023 0320   BUN 24 07/02/2017 0856   CREATININE 1.06 (H) 09/18/2023 0320   CREATININE 0.78 09/05/2022 0918   CALCIUM  8.6 (L) 09/18/2023 0320   CALCIUM  9.9 07/25/2022 1000   GFRNONAA 51 (L) 09/18/2023 0320   GFRNONAA >60 09/05/2022 0918   GFRAA 56 (L) 07/17/2017 0925    INR    Component Value Date/Time   INR 1.0 09/14/2023 1520     Intake/Output Summary (Last 24 hours) at 09/18/2023 0737 Last  data filed at 09/18/2023 0400 Gross per 24 hour  Intake 1072.04 ml  Output 950 ml  Net 122.04 ml     Assessment/Plan:  88 y.o. female is s/p right TCAR for symptomatic stenosis 1 Day Post-Op   Neurologically intact Right neck incision is c/d/I without swelling or hematoma Hemodynamically stable Continue Aspirin , Statin, Plavix  Stable for discharge from vascular standpoint Follow up in 3-4 weeks with carotid duplex  Deneen Finical, PA-C [facility] [phone] 09/18/2023 7:37 AM  VASCULAR STAFF ADDENDUM: I have independently interviewed and examined the patient. I agree with the above.  Recovering well, okay for discharge today from surgical standpoint. Continue aspirin  statin and Plavix . Follow-up will be scheduled for about 4 weeks with a carotid duplex.  Philipp Brawn MD [facility] Phone Number: [phone] 09/18/2023 9:16 AM

## 2023-09-18 NOTE — Progress Notes (Signed)
 DISCHARGE NOTE HOME Jacqueline Burke to be discharged Home per MD order. Discussed prescriptions and follow up appointments with the patient. Prescriptions given to patient; medication list explained in detail. Patient verbalized understanding.  Skin clean, dry and intact without evidence of skin break down, no evidence of skin tears noted. IV catheter discontinued intact. Site without signs and symptoms of complications. Dressing and pressure applied. Pt denies pain at the site currently. No complaints noted.  SEE LDA discharging with two incisions  An After Visit Summary (AVS) was printed and given to the patient. Patient escorted via wheelchair, and discharged home via private auto.  [name], RN

## 2023-09-18 NOTE — Discharge Summary (Signed)
 Physician Discharge Summary   Patient: Jacqueline Burke MRN: [id number] DOB: August 20, 1935  Admit date:     09/14/2023  Discharge date: 09/18/23  Discharge Physician: Feliciana Horn   PCP: Ransom Byers, MD   Recommendations at discharge:  Please follow up with Neurology and vascular surgery as recommended.  Please follow up with outpatient Pt/ot  Please follow up with PCP in one week.   Discharge Diagnoses: Principal Problem:   CRAO (central retinal artery occlusion), right Active Problems:   Acute CVA (cerebrovascular accident) (HCC)   Benign hypertensive heart disease without heart failure   CAD (coronary artery disease), native coronary artery   Hyperlipidemia LDL goal <70   Essential hypertension   COPD (chronic obstructive pulmonary disease) (HCC)   RA (rheumatoid arthritis) (HCC)   Hyperkalemia   AKI (acute kidney injury) (HCC)   Symptomatic carotid artery stenosis   Hypomagnesemia  Resolved Problems:   * No resolved hospital problems. *  Hospital Course:  88 year old female history of CAD, hypertension, hyperlipidemia, rheumatoid arthritis presented to the ED with sudden onset right visual field loss.  Patient seen by ophthalmologist and noted to have central retinal artery occlusion and sent to the ED for further evaluation.  Patient seen in the ED MRI/MRA head done concerning for acute CVA.  CT angiogram head and neck done with concerns for 50% stenosis at the origin of the right cervical ICA as well as stenosis at the ECA.  Neurology consulted and following.  Neurology consulted vascular for further evaluation due to concern for symptomatic carotid artery stenosis.    Assessment and Plan:  Central retinal artery occlusion secondary to acute CVA from carotid artery stenosis Patient was admitted with sudden visual field loss in the right. Patient was seen by ophthalmology and noted to have a central retinal artery occlusion and sent to ED for further stroke  workup She had an MRI and MRI of the head showing punctate acute/early subacute infarct in the right corona radiator CT angiogram of the head and neck showed 50% stenosis at the origin of the right cervical ICA 2D echo cardiogram reviewed.  Fasting lipid panel shows LDL of 57 and hemoglobin A1c of 5.8 She was seen by neurology and vascular surgeon. Vascular surgery on board and she underwent Right carotid stent placement ( tcar).  Meanwhile continue the patient on aspirin , Plavix  and Lipitor  on discharge.      AKI Resolved with IV fluids Creatinine wnl.      Hypertension Blood pressure parameters are well controlled.  Continue with Metoprolol . Norvasc  held for borderline BP parameters. Recommend restarting Norvasc  at a later date as per PCP.      Hypomagnesemia Replaced. Repeat level wnl.        History of rheumatoid arthritis Continue with home medications  and follow-up with rheumatologist       Estimated body mass index is 24.02 kg/m as calculated from the following:   Height as of this encounter: 5' (1.524 m).   Weight as of this encounter: 55.8 kg.    Consultants: neurology Vascular surgery.  Procedures performed: TCAR  Disposition: Home Diet recommendation:  Cardiac diet DISCHARGE MEDICATION: Allergies as of 09/18/2023       Reactions   Ciprofloxacin Hives   Codeine Hives   Demerol  [meperidine ] Hives   Hydrogen Peroxide Itching, Other (See Comments)   White blisters   Propoxyphene Other (See Comments)   Unknown Other reaction(s): OTHER   Tramadol Other (See Comments)   "ITCHY ON THE  INSIDE"        Medication List     STOP taking these medications    amLODipine  2.5 MG tablet Commonly known as: NORVASC    amLODipine  5 MG tablet Commonly known as: NORVASC    HYDROcodone -acetaminophen  5-325 MG tablet Commonly known as: NORCO/VICODIN       TAKE these medications    acetaminophen  325 MG tablet Commonly known as: TYLENOL  Take 325 mg by  mouth at bedtime.   aspirin  EC 81 MG tablet Take 1 tablet (81 mg total) by mouth daily. Swallow whole.   atorvastatin  80 MG tablet Commonly known as: LIPITOR  TAKE 1 TABLET BY MOUTH ONCE DAILY AT  6  PM   Camphor-Menthol -Methyl Sal 3.06-07-08 % Ptch at bedtime.   cetirizine  10 MG chewable tablet Commonly known as: ZYRTEC  Chew 1 tablet (10 mg total) by mouth daily.   clopidogrel  75 MG tablet Commonly known as: PLAVIX  Take 1 tablet by mouth once daily   Cyanocobalamin  5000 MCG Tbdp Take 5,000 mcg by mouth daily.   docusate sodium  100 MG capsule Commonly known as: COLACE Take 1 capsule (100 mg total) by mouth daily.   fluticasone  50 MCG/ACT nasal spray Commonly known as: FLONASE  Place 2 sprays into both nostrils daily. What changed:  when to take this reasons to take this   gabapentin  300 MG capsule Commonly known as: NEURONTIN  Take 2 capsules (600 mg total) by mouth daily. What changed:  how much to take when to take this   guaiFENesin  600 MG 12 hr tablet Commonly known as: MUCINEX  Take 1 tablet (600 mg total) by mouth 2 (two) times daily. What changed:  when to take this reasons to take this   metoprolol  tartrate 25 MG tablet Commonly known as: LOPRESSOR  Take 0.5 tablets (12.5 mg total) by mouth 2 (two) times daily.   multivitamin capsule Take 1 capsule by mouth daily.   nitroGLYCERIN  0.4 MG SL tablet Commonly known as: NITROSTAT  Place 1 tablet (0.4 mg total) under the tongue every 5 (five) minutes as needed for chest pain.   ondansetron  8 MG disintegrating tablet Commonly known as: ZOFRAN -ODT Take 1 tablet (8 mg total) by mouth every 8 (eight) hours as needed for nausea or vomiting.   pantoprazole  40 MG tablet Commonly known as: PROTONIX  Take 1 tablet (40 mg total) by mouth daily.   ProAir  RespiClick 108 (90 Base) MCG/ACT Aepb Generic drug: Albuterol  Sulfate Inhale 1 puff into the lungs daily as needed (wheezing).   Spiriva  Respimat 1.25 MCG/ACT  Aers Generic drug: Tiotropium Bromide  Monohydrate Inhale 2 each into the lungs daily as needed (Congestion).   Tymlos 3120 MCG/1.56ML Sopn Generic drug: Abaloparatide Inject 80 mcg into the skin at bedtime.   VITAMIN D  PO Take 3,000 Units by mouth daily.               Discharge Care Instructions  (From admission, onward)           Start     Ordered   09/18/23 0000  Discharge wound care:       Comments: Keep dry for 24 hours. You can then shower and wash incision with mild soap and water  pat dry, do not soak in bathtub   09/18/23 0901            Follow-up Information     [facility]. Schedule an appointment as soon as possible for a visit.   Specialty: Rehabilitation Contact information: [address] [place]  [address] [phone]  [facility]. Schedule an appointment as soon as possible for a visit in 1 month(s).   Specialty: Neurology Why: stroke clinic Contact information: [address] [place]  [address] [phone]        [facility] Follow up.   Why: 3-4 weeks.The office will call you with your appointment Contact information: [address] [place]  [address] [phone]               Discharge Exam: Filed Weights   09/17/23 0524 09/17/23 0645 09/18/23 0359  Weight: 55.8 kg 55.8 kg 58.9 kg   General exam: Appears calm and comfortable  Respiratory system: Clear to auscultation. Respiratory effort normal. Cardiovascular system: S1 & S2 heard, RRR. No JVD,  Gastrointestinal system: Abdomen is nondistended, soft and nontender. Central nervous system: Alert and oriented. No focal neurological deficits. Extremities: Symmetric 5 x 5 power. Skin: Incision site c/d/i Psychiatry: Mood & affect appropriate.    Condition at discharge: fair  The results of significant diagnostics from  this hospitalization (including imaging, microbiology, ancillary and laboratory) are listed below for reference.   Imaging Studies: HYBRID OR IMAGING ([facility] ONLY) Result Date: 09/17/2023 There is no interpretation for this exam.  This order is for images obtained during a surgical procedure.  Please See "Surgeries" Tab for more information regarding the procedure.   VAS US  CAROTID Result Date: 09/15/2023 Carotid Arterial Duplex Study Patient Name:  TAWNIA SCHIRM  Date of Exam:   09/15/2023 Medical Rec #: [id number]       Accession #:    [id number] Date of Birth: March 26, 1936        Patient Gender: F Patient Age:   89 years Exam Location:  [facility] Procedure:      VAS US  CAROTID Referring Phys: Delaney Fearing --------------------------------------------------------------------------------  Indications:       Carotid artery disease, Visual disturbance and onset of right                    visual loss. Risk Factors:      Hypertension, hyperlipidemia, past history of smoking,                    coronary artery disease. Other Factors:     GERD. Comparison Study:  09/15/23 - CT ANGIO HEAD NECK showed right ICA approximately                    50% stenosis. Left ICA no significant stenosis.                     08/06/2015 - Carotid - [facility] showed right ICA 50-69%                    stenosis, left ICA WNL. Performing Technologist: Franky Ivanoff Sturdivant-Jones RDMS, RVT  Examination Guidelines: A complete evaluation includes B-mode imaging, spectral Doppler, color Doppler, and power Doppler as needed of all accessible portions of each vessel. Bilateral testing is considered an integral part of a complete examination. Limited examinations for reoccurring indications may be performed as noted.  Right Carotid Findings: +----------+--------+--------+--------+------------------+--------+           PSV cm/sEDV cm/sStenosisPlaque DescriptionComments +----------+--------+--------+--------+------------------+--------+  CCA Prox  57      17                                         +----------+--------+--------+--------+------------------+--------+  CCA Distal62      15                                         +----------+--------+--------+--------+------------------+--------+ ICA Prox  190     44      40-59%  heterogenous               +----------+--------+--------+--------+------------------+--------+ ICA Mid   130     17                                         +----------+--------+--------+--------+------------------+--------+ ICA Distal72      27                                         +----------+--------+--------+--------+------------------+--------+ ECA       106     15                                         +----------+--------+--------+--------+------------------+--------+ +----------+--------+-------+----------------+-------------------+           PSV cm/sEDV cmsDescribe        Arm Pressure (mmHG) +----------+--------+-------+----------------+-------------------+ [id number]            Multiphasic, WNL                    +----------+--------+-------+----------------+-------------------+ +---------+--------+--+--------+--+---------+ VertebralPSV cm/s38EDV cm/s13Antegrade +---------+--------+--+--------+--+---------+  Left Carotid Findings: +----------+--------+--------+--------+------------------+--------+           PSV cm/sEDV cm/sStenosisPlaque DescriptionComments +----------+--------+--------+--------+------------------+--------+ CCA Prox  73      17                                         +----------+--------+--------+--------+------------------+--------+ CCA Distal98      19                                         +----------+--------+--------+--------+------------------+--------+ ICA Prox  93      17      1-39%   heterogenous               +----------+--------+--------+--------+------------------+--------+ ICA Distal119     15                                          +----------+--------+--------+--------+------------------+--------+ ECA       156                                                +----------+--------+--------+--------+------------------+--------+ +----------+--------+--------+----------------+-------------------+           PSV cm/sEDV cm/sDescribe        Arm Pressure (mmHG) +----------+--------+--------+----------------+-------------------+ [id number]     29      Multiphasic, WNL                    +----------+--------+--------+----------------+-------------------+ +---------+--------+--+--------+--+---------+  VertebralPSV cm/s47EDV cm/s14Antegrade +---------+--------+--+--------+--+---------+   Summary:   *See table(s) above for measurements and observations.  Electronically signed by Delaney Fearing on 09/15/2023 at 5:26:22 PM.    Final    ECHOCARDIOGRAM COMPLETE Result Date: 09/15/2023    ECHOCARDIOGRAM REPORT   Patient Name:   Betina BRANTLEE HINDE Date of Exam: 09/15/2023 Medical Rec #:  [id number]      Height:       61.0 in Accession #:    [id number]     Weight:       126.0 lb Date of Birth:  1935-08-14       BSA:          1.552 m Patient Age:    87 years       BP:           141/67 mmHg Patient Gender: F              HR:           67 bpm. Exam Location:  Inpatient Procedure: 2D Echo, Cardiac Doppler and Color Doppler (Both Spectral and Color            Flow Doppler were utilized during procedure). Indications:    CRAO (central retinal artery occlusion), right  History:        Patient has prior history of Echocardiogram examinations, most                 recent 04/23/2021. CAD and Previous Myocardial Infarction; Risk                 Factors:Hypertension.  Sonographer:    Astrid Blamer Referring Phys: [id number] DANIEL V THOMPSON IMPRESSIONS  1. Left ventricular ejection fraction, by estimation, is 60 to 65%. The left ventricle has normal function. The left ventricle has no regional wall motion  abnormalities. There is mild left ventricular hypertrophy. Left ventricular diastolic parameters are consistent with Grade I diastolic dysfunction (impaired relaxation).  2. Right ventricular systolic function is normal. The right ventricular size is normal.  3. The mitral valve is normal in structure. No evidence of mitral valve regurgitation. No evidence of mitral stenosis.  4. The aortic valve is tricuspid. There is mild calcification of the aortic valve. Aortic valve regurgitation is not visualized. Aortic valve sclerosis is present, with no evidence of aortic valve stenosis. Aortic valve Vmax measures 1.04 m/s.  5. The inferior vena cava is normal in size with greater than 50% respiratory variability, suggesting right atrial pressure of 3 mmHg. FINDINGS  Left Ventricle: Left ventricular ejection fraction, by estimation, is 60 to 65%. The left ventricle has normal function. The left ventricle has no regional wall motion abnormalities. The left ventricular internal cavity size was normal in size. There is  mild left ventricular hypertrophy. Left ventricular diastolic parameters are consistent with Grade I diastolic dysfunction (impaired relaxation). Right Ventricle: The right ventricular size is normal. No increase in right ventricular wall thickness. Right ventricular systolic function is normal. Left Atrium: Left atrial size was normal in size. Right Atrium: Right atrial size was normal in size. Pericardium: There is no evidence of pericardial effusion. Mitral Valve: The mitral valve is normal in structure. No evidence of mitral valve regurgitation. No evidence of mitral valve stenosis. Tricuspid Valve: The tricuspid valve is normal in structure. Tricuspid valve regurgitation is not demonstrated. No evidence of tricuspid stenosis. Aortic Valve: The aortic valve is tricuspid. There is mild calcification of the aortic valve. Aortic valve regurgitation is not visualized. Aortic  valve sclerosis is present, with no  evidence of aortic valve stenosis. Aortic valve mean gradient measures 3.0 mmHg. Aortic valve peak gradient measures 4.3 mmHg. Aortic valve area, by VTI measures 2.59 cm. Pulmonic Valve: The pulmonic valve was normal in structure. Pulmonic valve regurgitation is not visualized. No evidence of pulmonic stenosis. Aorta: The aortic root is normal in size and structure. Venous: The inferior vena cava is normal in size with greater than 50% respiratory variability, suggesting right atrial pressure of 3 mmHg. IAS/Shunts: No atrial level shunt detected by color flow Doppler.  LEFT VENTRICLE PLAX 2D LVIDd:         3.50 cm   Diastology LV PW:         1.10 cm   LV e' medial:    7.40 cm/s LV IVS:        1.20 cm   LV E/e' medial:  12.2 LVOT diam:     1.80 cm   LV e' lateral:   6.20 cm/s LV SV:         78        LV E/e' lateral: 14.5 LV SV Index:   50 LVOT Area:     2.54 cm  RIGHT VENTRICLE RV S prime:     12.70 cm/s TAPSE (M-mode): 2.1 cm LEFT ATRIUM             Index        RIGHT ATRIUM          Index LA Vol (A2C):   26.6 ml 17.14 ml/m  RA Area:     9.48 cm LA Vol (A4C):   41.3 ml 26.61 ml/m  RA Volume:   17.00 ml 10.95 ml/m LA Biplane Vol: 35.1 ml 22.62 ml/m  AORTIC VALVE AV Area (Vmax):    2.81 cm AV Area (Vmean):   2.47 cm AV Area (VTI):     2.59 cm AV Vmax:           104.00 cm/s AV Vmean:          83.200 cm/s AV VTI:            0.303 m AV Peak Grad:      4.3 mmHg AV Mean Grad:      3.0 mmHg LVOT Vmax:         115.00 cm/s LVOT Vmean:        80.700 cm/s LVOT VTI:          0.308 m LVOT/AV VTI ratio: 1.02  AORTA Ao Root diam: 2.80 cm MITRAL VALVE MV Area (PHT): 1.68 cm     SHUNTS MV Decel Time: 451 msec     Systemic VTI:  0.31 m MV E velocity: 90.10 cm/s   Systemic Diam: 1.80 cm MV A velocity: 131.00 cm/s MV E/A ratio:  0.69 Dorothye Gathers MD Electronically signed by Dorothye Gathers MD Signature Date/Time: 09/15/2023/2:11:37 PM    Final    CT ANGIO HEAD NECK W WO CM Result Date: 09/15/2023 CLINICAL DATA:  Stroke/TIA,  vision loss in right eye. EXAM: CT ANGIOGRAPHY HEAD AND NECK WITH AND WITHOUT CONTRAST TECHNIQUE: Multidetector CT imaging of the head and neck was performed using the standard protocol during bolus administration of intravenous contrast. Multiplanar CT image reconstructions and MIPs were obtained to evaluate the vascular anatomy. Carotid stenosis measurements (when applicable) are obtained utilizing NASCET criteria, using the distal internal carotid diameter as the denominator. RADIATION DOSE REDUCTION: This exam was performed according to the departmental dose-optimization program  which includes automated exposure control, adjustment of the mA and/or kV according to patient size and/or use of iterative reconstruction technique. CONTRAST:  75mL OMNIPAQUE  IOHEXOL  350 MG/ML SOLN COMPARISON:  MRA head 09/14/2023. FINDINGS: CT HEAD FINDINGS Brain: No acute intracranial hemorrhage. No CT evidence of acute infarct. Nonspecific hypoattenuation in the periventricular and subcortical white matter favored to reflect chronic microvascular ischemic changes. No edema, mass effect, or midline shift. The basilar cisterns are patent. Ventricles: Prominence of the ventricles suggestive of underlying parenchymal volume loss. Vascular: No hyperdense vessel. Intracranial atherosclerotic calcifications noted. Skull: No acute or aggressive finding. Sinuses/orbits: Postsurgical changes of both globes. Mild mucosal thickening in the ethmoid sinuses. Other: Mastoid air cells are clear. CTA NECK FINDINGS Aortic arch: Limited visualization of the aortic arch. There is mild atherosclerosis along the distal aortic arch. No evidence of aneurysm of the distal arch. Pulmonary arteries: As permitted by contrast timing, there are no filling defects in the visualized pulmonary arteries. Subclavian arteries: Patent bilaterally. Atherosclerosis involving the right brachiocephalic artery resulting in at least mild stenosis. Additional atherosclerosis  of the proximal right subclavian artery. Atherosclerosis of the proximal left subclavian artery without significant stenosis. Right carotid system: Patent from the origin to the skull base. Atherosclerosis at the carotid bifurcation resulting in approximately 50% stenosis at the origin of the right cervical ICA. Additional stenosis at the origin of the external carotid artery. Left carotid system: Patent from the origin to the skull base. Atherosclerosis at the carotid bifurcation without hemodynamically significant stenosis. Vertebral arteries: Codominant. No evidence of dissection or occlusion. Atherosclerosis at the origin of the right vertebral artery resulting in mild stenosis. There is atherosclerosis of the left subclavian artery just proximal to the vertebral artery origin without significant stenosis. Atherosclerosis involving the bilateral V4 segments with mild stenosis on the right and moderate stenosis on the left. Skeleton: No acute findings. Degenerative changes in the cervical spine. Disc space narrowing in the cervical spine is most pronounced at C6-7. Anterolisthesis of C4 on C5 likely related to facet degenerative changes. Other neck: The visualized airway is patent. No cervical lymphadenopathy. Upper chest: Emphysema in the visualized lung apices. Review of the MIP images confirms the above findings CTA HEAD FINDINGS ANTERIOR CIRCULATION: The intracranial ICAs are patent bilaterally. Atherosclerosis of the bilateral carotid siphons. Mild stenosis of the right supraclinoid ICA. No significant stenosis, proximal occlusion, aneurysm, or vascular malformation. MCAs: Patent bilaterally. Mild narrowing of the proximal right M1 segment. There is severe stenosis of an M2 inferior division branch of the right MCA. Early branching of the left M1 segment. ACAs: The anterior cerebral arteries are patent bilaterally. POSTERIOR CIRCULATION: No significant stenosis, proximal occlusion, aneurysm, or vascular  malformation. PCAs: The posterior cerebral arteries are patent bilaterally. Pcomm: Not well visualized. SCAs: The superior cerebellar arteries are patent bilaterally. Basilar artery: Patent AICAs: Patent PICAs: Patent Vertebral arteries: The intracranial vertebral arteries are patent. Venous sinuses: As permitted by contrast timing, patent. Anatomic variants: None Review of the MIP images confirms the above findings IMPRESSION: No large vessel occlusion. No acute intracranial hemorrhage. The small right corona radiata infarct noted on MRI is not well appreciated on the current study. Atherosclerosis of the arteries in the neck. Approximately 50% stenosis at the origin of the right cervical ICA. Intracranial atherosclerosis as above. Moderate stenosis of the V4 segment left vertebral artery. Mild stenosis of the right supraclinoid ICA. Severe stenosis of a small M2 inferior division branch of the right MCA. Aortic Atherosclerosis (ICD10-I70.0) and Emphysema (  ICD10-J43.9). Electronically Signed   By: Denny Flack M.D.   On: 09/15/2023 10:23   MR BRAIN WO CONTRAST Result Date: 09/14/2023 CLINICAL DATA:  Neuro deficit, acute, stroke suspected. Acute right eye vision loss. Central retinal artery occlusion. EXAM: MRI HEAD WITHOUT CONTRAST MRA HEAD WITHOUT CONTRAST TECHNIQUE: Multiplanar, multi-echo pulse sequences of the brain and surrounding structures were acquired without intravenous contrast. Angiographic images of the Circle of Willis were acquired using MRA technique without intravenous contrast. COMPARISON:  Head CT 10/21/2022 FINDINGS: MRI HEAD FINDINGS Brain: There is a punctate acute or early subacute infarct in the right corona radiata. No intracranial hemorrhage, mass, midline shift, or extra-axial fluid collection is identified. T2 hyperintensities in the cerebral white matter bilaterally are nonspecific but compatible with mild chronic small vessel ischemic disease. There is mild cerebral atrophy.  Vascular: Major intracranial vascular flow voids are preserved. Skull and upper cervical spine: Unremarkable bone marrow signal. Sinuses/Orbits: Bilateral cataract extraction. Paranasal sinuses and mastoid air cells are clear. Other: None. MRA HEAD FINDINGS Anterior circulation: The internal carotid arteries are widely patent from skull base to carotid termini. ACAs and MCAs are patent without evidence of a proximal branch occlusion or significant proximal stenosis. No aneurysm is identified. Posterior circulation: The included portions of the intracranial vertebral arteries are patent to the basilar with a moderate stenosis of the distal left V4 segment. The basilar artery is widely patent. Posterior communicating arteries are diminutive or absent. Both PCAs are patent without evidence of a significant proximal stenosis. No aneurysm is identified. Anatomic variants: None. IMPRESSION: 1. 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 2. Mild chronic small vessel ischemic disease. 3. Moderate left V4 stenosis.  Otherwise negative head MRA. Electronically Signed   By: Aundra Lee M.D.   On: 09/14/2023 21:15   MR ANGIO HEAD WO CONTRAST Result Date: 09/14/2023 CLINICAL DATA:  Neuro deficit, acute, stroke suspected. Acute right eye vision loss. Central retinal artery occlusion. EXAM: MRI HEAD WITHOUT CONTRAST MRA HEAD WITHOUT CONTRAST TECHNIQUE: Multiplanar, multi-echo pulse sequences of the brain and surrounding structures were acquired without intravenous contrast. Angiographic images of the Circle of Willis were acquired using MRA technique without intravenous contrast. COMPARISON:  Head CT 10/21/2022 FINDINGS: MRI HEAD FINDINGS Brain: There is a punctate acute or early subacute infarct in the right corona radiata. No intracranial hemorrhage, mass, midline shift, or extra-axial fluid collection is identified. T2 hyperintensities in the cerebral white matter bilaterally are nonspecific but  compatible with mild chronic small vessel ischemic disease. There is mild cerebral atrophy. Vascular: Major intracranial vascular flow voids are preserved. Skull and upper cervical spine: Unremarkable bone marrow signal. Sinuses/Orbits: Bilateral cataract extraction. Paranasal sinuses and mastoid air cells are clear. Other: None. MRA HEAD FINDINGS Anterior circulation: The internal carotid arteries are widely patent from skull base to carotid termini. ACAs and MCAs are patent without evidence of a proximal branch occlusion or significant proximal stenosis. No aneurysm is identified. Posterior circulation: The included portions of the intracranial vertebral arteries are patent to the basilar with a moderate stenosis of the distal left V4 segment. The basilar artery is widely patent. Posterior communicating arteries are diminutive or absent. Both PCAs are patent without evidence of a significant proximal stenosis. No aneurysm is identified. Anatomic variants: None. IMPRESSION: 1. 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 2. Mild chronic small vessel ischemic disease. 3. Moderate left V4 stenosis.  Otherwise negative head MRA. Electronically Signed   By: Constantine Delude.D.  On: 09/14/2023 21:15    Microbiology: Results for orders placed or performed during the hospital encounter of 09/14/23  Surgical pcr screen     Status: None   Collection Time: 09/16/23  6:38 PM   Specimen: Nasal Mucosa; Nasal Swab  Result Value Ref Range Status   MRSA, PCR NEGATIVE NEGATIVE Final   Staphylococcus aureus NEGATIVE NEGATIVE Final    Comment: (NOTE) The Xpert SA Assay (FDA approved for NASAL specimens in patients 73 years of age and older), is one component of a comprehensive surveillance program. It is not intended to diagnose infection nor to guide or monitor treatment. Performed at [facility], [address], [place], [place] [address]     Labs: CBC: Recent Labs  Lab  09/14/23 1520 09/14/23 1524 09/15/23 1207 09/16/23 0551 09/18/23 0320  WBC 8.9  --  9.2 8.0 7.6  NEUTROABS 5.8  --   --   --   --   HGB 11.7* 12.2 10.0* 11.1* 9.3*  HCT 35.8* 36.0 30.8* 33.7* 28.5*  MCV 91.6  --  92.5 90.1 91.1  PLT 279  --  206 268 204   Basic Metabolic Panel: Recent Labs  Lab 09/14/23 1520 09/14/23 1524 09/14/23 1925 09/15/23 1207 09/16/23 0551 09/18/23 0320  NA 140 140 142 141 140 141  K 4.9 5.2* 4.5 3.9 3.9 4.1  CL 104 106 107 111 107 110  CO2 26  --  25 21* 24 24  GLUCOSE 105* 102* 105* 91 86 90  BUN 18 25* 19 11 14 9   CREATININE 1.23* 1.30* 1.12* 0.96 0.99 1.06*  CALCIUM  10.2  --  10.3 8.6* 9.3 8.6*  MG  --   --   --  1.6* 2.2  --   PHOS  --   --   --   --  4.2  --    Liver Function Tests: Recent Labs  Lab 09/14/23 1520 09/16/23 0551  AST 20  --   ALT 15  --   ALKPHOS 38  --   BILITOT 0.5  --   PROT 7.2  --   ALBUMIN  4.0 3.5   CBG: Recent Labs  Lab 09/14/23 1521 09/17/23 0601  GLUCAP 92 84    Discharge time spent: 40 minutes.   Signed: Feliciana Horn, MD [facility] 09/18/2023

## 2023-09-18 NOTE — TOC Transition Note (Signed)
 Transition of Care (TOC) - Discharge Note Sherin Dingwall RN, BSN Transitions of Care Unit 4E- RN Case Manager See Treatment Team for direct phone #   Patient Details  Name: Jacqueline Burke MRN: [id number] Date of Birth: 01/12/1936  [facility]) CM/SW Contact:  Rox Cope, RN Phone Number: 09/18/2023, 11:28 AM   Clinical Narrative:    Pt stable for transition home today. S/P TCAR. Per previous CM note- referral has been made to [facility] outpt Neuro rehab for therapy- no addition needs noted.   Family to transport home.   No further TOC needs noted.    Final next level of care: OP Rehab Barriers to Discharge: Barriers Resolved   Patient Goals and CMS Choice Patient states their goals for this hospitalization and ongoing recovery are:: return home   Choice offered to / list presented to : NA      Discharge Placement                 Home      Discharge Plan and Services Additional resources added to the After Visit Summary for     Discharge Planning Services: CM Consult Post Acute Care Choice: NA          DME Arranged: N/A DME Agency: NA       HH Arranged: NA HH Agency: NA        Social Drivers of Health (SDOH) Interventions SDOH Screenings   Food Insecurity: No Food Insecurity (09/15/2023)  Housing: Low Risk  (09/15/2023)  Transportation Needs: No Transportation Needs (09/15/2023)  Utilities: Not At Risk (09/15/2023)  Depression (PHQ2-9): Low Risk  (07/22/2022)  Physical Activity: Inactive (10/29/2017)  Social Connections: Moderately Integrated (09/15/2023)  Stress: No Stress Concern Present (10/29/2017)  Tobacco Use: Medium Risk (09/17/2023)     Readmission Risk Interventions    09/18/2023   11:28 AM 08/08/2022    3:21 PM  Readmission Risk Prevention Plan  Post Dischage Appt  Complete  Medication Screening  Complete  Transportation Screening Complete Complete  Home Care Screening Complete   Medication Review (RN CM) Complete

## 2023-09-18 NOTE — Discharge Instructions (Signed)
   [facility]  Discharge Instructions   Carotid Surgery  Please refer to the following instructions for your post-procedure care. Your surgeon or physician assistant will discuss any changes with you.  Activity

## 2023-09-18 NOTE — Progress Notes (Signed)
 Pt ambulated x 300 feet in hall with front wheel walker pt tolerated well

## 2023-09-18 NOTE — Plan of Care (Signed)
  Problem: Education: Goal: Knowledge of secondary prevention will improve (MUST DOCUMENT ALL) 09/18/2023 0159 by Hughie Mae, RN Outcome: Progressing 09/17/2023 2330 by Hughie Mae, RN Outcome: Progressing Goal: Knowledge of patient specific risk factors will improve (DELETE if not current risk factor) Outcome: Progressing   Problem: Coping: Goal: Will identify appropriate support needs Outcome: Progressing   Problem: Self-Care: Goal: Ability to participate in self-care as condition permits will improve Outcome: Progressing

## 2023-09-18 NOTE — Progress Notes (Signed)
   09/18/23 1209  Assess: MEWS Score  Temp 98.4 F (36.9 C)  BP (!) 78/42  MAP (mmHg) (!) 54  Pulse Rate (!) 54  ECG Heart Rate (!) 53  Resp 19  Level of Consciousness Alert  O2 Device Room Air  Assess: MEWS Score  MEWS Temp 0  MEWS Systolic 2  MEWS Pulse 0  MEWS RR 0  MEWS LOC 0  MEWS Score 2  MEWS Score Color Yellow  Notify: Charge Nurse/RN  Name of Charge Nurse/RN Notified Jen  Assess: SIRS CRITERIA  SIRS Temperature  0  SIRS Respirations  0  SIRS Pulse 0  SIRS WBC 0  SIRS Score Sum  0

## 2023-09-18 NOTE — Plan of Care (Signed)
  Problem: Education: Goal: Knowledge of disease or condition will improve Outcome: Adequate for Discharge Goal: Knowledge of secondary prevention will improve (MUST DOCUMENT ALL) Outcome: Adequate for Discharge Goal: Knowledge of patient specific risk factors will improve (DELETE if not current risk factor) Outcome: Adequate for Discharge   Problem: Ischemic Stroke/TIA Tissue Perfusion: Goal: Complications of ischemic stroke/TIA will be minimized Outcome: Adequate for Discharge   Problem: Coping: Goal: Will verbalize positive feelings about self Outcome: Adequate for Discharge Goal: Will identify appropriate support needs Outcome: Adequate for Discharge   Problem: Health Behavior/Discharge Planning: Goal: Ability to manage health-related needs will improve Outcome: Adequate for Discharge Goal: Goals will be collaboratively established with patient/family Outcome: Adequate for Discharge   Problem: Self-Care: Goal: Ability to participate in self-care as condition permits will improve Outcome: Adequate for Discharge Goal: Verbalization of feelings and concerns over difficulty with self-care will improve Outcome: Adequate for Discharge Goal: Ability to communicate needs accurately will improve Outcome: Adequate for Discharge   Problem: Nutrition: Goal: Risk of aspiration will decrease Outcome: Adequate for Discharge Goal: Dietary intake will improve Outcome: Adequate for Discharge   Problem: Acute Rehab PT Goals(only PT should resolve) Goal: Pt Will Go Supine/Side To Sit Outcome: Adequate for Discharge Goal: Patient Will Transfer Sit To/From Stand Outcome: Adequate for Discharge Goal: Pt Will Ambulate Outcome: Adequate for Discharge Goal: Pt Will Go Up/Down Stairs Outcome: Adequate for Discharge Goal: PT Additional Goal #1 Outcome: Adequate for Discharge   Problem: Education: Goal: Knowledge of General Education information will improve Description: Including pain  rating scale, medication(s)/side effects and non-pharmacologic comfort measures Outcome: Adequate for Discharge   Problem: Health Behavior/Discharge Planning: Goal: Ability to manage health-related needs will improve Outcome: Adequate for Discharge   Problem: Clinical Measurements: Goal: Ability to maintain clinical measurements within normal limits will improve Outcome: Adequate for Discharge Goal: Will remain free from infection Outcome: Adequate for Discharge Goal: Diagnostic test results will improve Outcome: Adequate for Discharge Goal: Respiratory complications will improve Outcome: Adequate for Discharge Goal: Cardiovascular complication will be avoided Outcome: Adequate for Discharge   Problem: Activity: Goal: Risk for activity intolerance will decrease Outcome: Adequate for Discharge   Problem: Nutrition: Goal: Adequate nutrition will be maintained Outcome: Adequate for Discharge   Problem: Elimination: Goal: Will not experience complications related to bowel motility Outcome: Adequate for Discharge Goal: Will not experience complications related to urinary retention Outcome: Adequate for Discharge   Problem: Pain Managment: Goal: General experience of comfort will improve and/or be controlled Outcome: Adequate for Discharge   Problem: Safety: Goal: Ability to remain free from injury will improve Outcome: Adequate for Discharge   Problem: Acute Rehab OT Goals (only OT should resolve) Goal: OT Additional ADL Goal #1 Outcome: Adequate for Discharge Goal: OT Additional ADL Goal #2 Outcome: Adequate for Discharge   Problem: Education: Goal: Knowledge of discharge needs will improve Outcome: Adequate for Discharge   Problem: Clinical Measurements: Goal: Postoperative complications will be avoided or minimized Outcome: Adequate for Discharge   Problem: Respiratory: Goal: Will achieve and/or maintain a regular respiratory rate, without signs or symptoms of  dyspnea Outcome: Adequate for Discharge   Problem: Skin Integrity: Goal: Demonstration of wound healing without infection will improve Outcome: Adequate for Discharge

## 2023-09-18 NOTE — Progress Notes (Signed)
 Physical Therapy Treatment Patient Details Name: Jacqueline Burke MRN: [id number] DOB: 23-Oct-1935 Today's Date: 09/18/2023   History of Present Illness 88 y.o. female who presented to the hospital with sudden loss of vision in the right eye. Pt with CRAO.  MRA/MRI that revealed punctate acute/early subacute infarct in the right corona radiata and mild chronic small vessel ischemic disease and moderate left V4 stenosis. Vascular following for possible carotid endarterectomy and carotid stenting. Had TCAR 09/17/23. PMH: HTN, CAD with hx of NSTEMI and hx of PCI of RCA with DES, hx of E coli bacteremia in 2019.    PT Comments  Did really well in PT, had her TCAR procedure yesterday and still moving very well with no significant change in mobility since last acute PT session. Did need up to Mod cues to make sure she was scanning environment appropriately for safety and navigation given blindness R eye. Per chart, she may discharge today- OK from PT side, moving very well and sounds to have good support at home.     If plan is discharge home, recommend the following: A little help with walking and/or transfers;A little help with bathing/dressing/bathroom;Help with stairs or ramp for entrance;Assist for transportation;Assistance with cooking/housework   Can travel by private vehicle        Equipment Recommendations  None recommended by PT    Recommendations for Other Services       Precautions / Restrictions Precautions Precautions: Fall Precaution/Restrictions Comments: R eye blindness Restrictions Weight Bearing Restrictions Per Provider Order: No     Mobility  Bed Mobility               General bed mobility comments: OOB in chair    Transfers Overall transfer level: Modified independent Equipment used: None Transfers: Sit to/from Stand Sit to Stand: Modified independent (Device/Increase time)           General transfer comment: steady no LOB     Ambulation/Gait Ambulation/Gait assistance: Modified independent (Device/Increase time) Gait Distance (Feet): 300 Feet Assistive device: Rolling walker (2 wheels) Gait Pattern/deviations: Step-through pattern Gait velocity: decreased     General Gait Details: slight imbalance, did well wtih AD, no significant change in gait pattern or balance since surgery yesterday   Stairs             Wheelchair Mobility     Tilt Bed    Modified Rankin (Stroke Patients Only) Modified Rankin (Stroke Patients Only) Pre-Morbid Rankin Score: No symptoms Modified Rankin: Slight disability     Balance Overall balance assessment: Mild deficits observed, not formally tested                                          Communication Communication Communication: No apparent difficulties  Cognition Arousal: Alert Behavior During Therapy: WFL for tasks assessed/performed   PT - Cognitive impairments: No apparent impairments                         Following commands: Intact      Cueing Cueing Techniques: Verbal cues, Tactile cues  Exercises      General Comments General comments (skin integrity, edema, etc.): VSS      Pertinent Vitals/Pain Pain Assessment Pain Assessment: No/denies pain    Home Living  Prior Function            PT Goals (current goals can now be found in the care plan section) Acute Rehab PT Goals Patient Stated Goal: to go home PT Goal Formulation: With patient Time For Goal Achievement: 09/29/23 Potential to Achieve Goals: Good Additional Goals Additional Goal #1: Pt to score > 21/24 on DGI. Progress towards PT goals: Progressing toward goals    Frequency    Min 1X/week      PT Plan      Co-evaluation              AM-PAC PT "6 Clicks" Mobility   Outcome Measure  Help needed turning from your back to your side while in a flat bed without using bedrails?: None Help  needed moving from lying on your back to sitting on the side of a flat bed without using bedrails?: None Help needed moving to and from a bed to a chair (including a wheelchair)?: None Help needed standing up from a chair using your arms (e.g., wheelchair or bedside chair)?: None Help needed to walk in hospital room?: A Little Help needed climbing 3-5 steps with a railing? : A Little 6 Click Score: 22    End of Session   Activity Tolerance: Patient tolerated treatment well Patient left: in chair;with call bell/phone within reach Nurse Communication: Mobility status PT Visit Diagnosis: Unsteadiness on feet (R26.81);Muscle weakness (generalized) (M62.81)     Time: 1610-9604 PT Time Calculation (min) (ACUTE ONLY): 16 min  Charges:    $Gait Training: 8-22 mins PT General Charges $$ ACUTE PT VISIT: 1 Visit                    Terrel Ferries, PT, DPT 09/18/23 9:42 AM

## 2023-09-24 DIAGNOSIS — I10 Essential (primary) hypertension: Secondary | ICD-10-CM | POA: Diagnosis not present

## 2023-09-24 DIAGNOSIS — Z95828 Presence of other vascular implants and grafts: Secondary | ICD-10-CM | POA: Diagnosis not present

## 2023-09-24 DIAGNOSIS — D649 Anemia, unspecified: Secondary | ICD-10-CM | POA: Diagnosis not present

## 2023-09-24 DIAGNOSIS — M5442 Lumbago with sciatica, left side: Secondary | ICD-10-CM | POA: Diagnosis not present

## 2023-09-24 DIAGNOSIS — R0981 Nasal congestion: Secondary | ICD-10-CM | POA: Diagnosis not present

## 2023-09-24 DIAGNOSIS — J8410 Pulmonary fibrosis, unspecified: Secondary | ICD-10-CM | POA: Diagnosis not present

## 2023-09-24 DIAGNOSIS — I2510 Atherosclerotic heart disease of native coronary artery without angina pectoris: Secondary | ICD-10-CM | POA: Diagnosis not present

## 2023-09-24 DIAGNOSIS — N1831 Chronic kidney disease, stage 3a: Secondary | ICD-10-CM | POA: Diagnosis not present

## 2023-09-24 DIAGNOSIS — I6521 Occlusion and stenosis of right carotid artery: Secondary | ICD-10-CM | POA: Diagnosis not present

## 2023-09-25 ENCOUNTER — Ambulatory Visit: Attending: Internal Medicine | Admitting: Physical Therapy

## 2023-09-25 ENCOUNTER — Other Ambulatory Visit: Payer: Self-pay

## 2023-09-25 ENCOUNTER — Encounter: Payer: Self-pay | Admitting: Physical Therapy

## 2023-09-25 VITALS — BP 110/48 | HR 58

## 2023-09-25 DIAGNOSIS — I639 Cerebral infarction, unspecified: Secondary | ICD-10-CM | POA: Diagnosis not present

## 2023-09-25 DIAGNOSIS — R269 Unspecified abnormalities of gait and mobility: Secondary | ICD-10-CM | POA: Diagnosis not present

## 2023-09-25 DIAGNOSIS — M6281 Muscle weakness (generalized): Secondary | ICD-10-CM | POA: Diagnosis not present

## 2023-09-25 DIAGNOSIS — R2681 Unsteadiness on feet: Secondary | ICD-10-CM | POA: Insufficient documentation

## 2023-09-25 NOTE — Therapy (Signed)
 OUTPATIENT PHYSICAL THERAPY NEURO EVALUATION   Patient Name: Jacqueline Burke MRN: [id number] DOB:[birth year], 88 y.o., female Today's Date: 09/25/2023   PCP: Ransom Byers, MD REFERRING PROVIDER: Armenta Landau, MD  END OF SESSION:  PT End of Session - 09/25/23 3244     Visit Number 1    Number of Visits 5    Date for PT Re-Evaluation 11/06/23    Authorization Type [employer]    PT Start Time [phone]    PT Stop Time 0928    PT Time Calculation (min) 38 min    Equipment Utilized During Treatment Gait belt    Activity Tolerance Patient tolerated treatment well;Treatment limited secondary to medical complications (Comment)   low blood pressure readings, recommend follow up with PCP   Behavior During Therapy [facility] for tasks assessed/performed             Past Medical History:  Diagnosis Date   Allergic rhinitis    CAD (coronary artery disease), native coronary artery    NSTEMI with 95% RCA s/p DES to RCA 06/2017   Complication of anesthesia    makes her confused cursing etc..   Diverticulosis    GERD (gastroesophageal reflux disease)    Heart murmur    Systolic heart murmur with Aortic valve sclerosis by ECHO   History of kidney stones    Hyperlipidemia    Hypertension    Myocardial infarction (HCC)    Osteoarthritis    Erosive OA-MRI of R hand negative for synovitis, only OA-Dr Meredith Stalls   Osteopenia    Pelvic prolapse    PVC's (premature ventricular contractions)    Past Surgical History:  Procedure Laterality Date   BUNIONECTOMY  08/2006   R foot and hammer roe right second toe   CARDIAC CATHETERIZATION     CARPAL TUNNEL RELEASE  2002   bil hands   CONVERSION TO TOTAL HIP Left 08/08/2022   Procedure: CONVERSION TO TOTAL HIP FROM IM NAIL;  Surgeon: Murleen Arms, MD;  Location: [facility];  Service: Orthopedics;  Laterality: Left;   CORONARY STENT INTERVENTION N/A 06/15/2017   Procedure: CORONARY STENT INTERVENTION;  Surgeon: Avanell Leigh, MD;   Location: [facility];  Service: Cardiovascular;  Laterality: N/A;   CYSTOSCOPY     with laser lithotripsy Dr. Parke Boll 07-20-17    CYSTOSCOPY W/ URETERAL STENT PLACEMENT Left 06/09/2017   Procedure: CYSTOSCOPY WITH RETROGRADE PYELOGRAM/URETERAL STENT PLACEMENT;  Surgeon: Samson Croak, MD;  Location: [facility];  Service: Urology;  Laterality: Left;   CYSTOSCOPY WITH RETROGRADE PYELOGRAM, URETEROSCOPY AND STENT PLACEMENT Left 07/20/2017   Procedure: CYSTOSCOPY WITH RETROGRADE PYELOGRAM, LEFT URETEROSCOPY HOLMIUM LASER LITHO  AND STENT EXCHANGE;  Surgeon: Samson Croak, MD;  Location: [facility];  Service: Urology;  Laterality: Left;   HOLMIUM LASER APPLICATION Left 07/20/2017   Procedure: HOLMIUM LASER APPLICATION;  Surgeon: Samson Croak, MD;  Location: [facility];  Service: Urology;  Laterality: Left;   INTRAMEDULLARY (IM) NAIL INTERTROCHANTERIC Left 08/04/2020   Procedure: INTRAMEDULLARY (IM) NAIL INTERTROCHANTRIC WITH CABLES;  Surgeon: Jasmine Mesi, MD;  Location: [facility];  Service: Orthopedics;  Laterality: Left;   LEFT HEART CATH AND CORONARY ANGIOGRAPHY N/A 06/15/2017   Procedure: LEFT HEART CATH AND CORONARY ANGIOGRAPHY;  Surgeon: Avanell Leigh, MD;  Location: [facility];  Service: Cardiovascular;  Laterality: N/A;   OPEN REDUCTION INTERNAL FIXATION (ORIF) DISTAL RADIAL FRACTURE Left 10/23/2022   Procedure: OPEN REDUCTION INTERNAL FIXATION (  ORIF) DISTAL RADIUS FRACTURE;  Surgeon: Osa Blase, MD;  Location: [facility];  Service: Orthopedics;  Laterality: Left;   TOTAL KNEE ARTHROPLASTY Left 08/2007   TOTAL KNEE ARTHROPLASTY Right 03/2010   TRANSCAROTID ARTERY REVASCULARIZATION  Right 09/17/2023   Procedure: TRANSCAROTID ARTERY REVASCULARIZATION (TCAR);  Surgeon: Philipp Brawn, MD;  Location: [facility];  Service: Vascular;  Laterality: Right;   ULTRASOUND GUIDANCE FOR VASCULAR ACCESS Left 09/17/2023   Procedure: ULTRASOUND GUIDANCE, FOR VASCULAR ACCESS;  Surgeon: Philipp Brawn, MD;  Location: [facility] OR;  Service: Vascular;  Laterality: Left;   Patient Active Problem List   Diagnosis Date Noted   Acute CVA (cerebrovascular accident) (HCC) 09/15/2023   Symptomatic carotid artery stenosis 09/15/2023   Hypomagnesemia 09/15/2023   CRAO (central retinal artery occlusion), right 09/14/2023   RA (rheumatoid arthritis) (HCC) 09/14/2023   Hyperkalemia 09/14/2023   AKI (acute kidney injury) (HCC) 09/14/2023   Senile osteoporosis 01/16/2023   Osteoarthritis of left hip, unspecified osteoarthritis type 08/08/2022   Nocturnal dyspnea 10/22/2021   COPD (chronic obstructive pulmonary disease) (HCC) 02/01/2021   Normochromic normocytic anemia 08/09/2020   Essential hypertension 08/03/2020   CKD (chronic kidney disease), stage III (HCC) 08/03/2020   Closed intertrochanteric fracture of hip, left, initial encounter (HCC) 08/03/2020   Pulmonary fibrosis (HCC) 08/03/2020   Cough 03/20/2020   CAD (coronary artery disease), native coronary artery 08/25/2017   Hyperlipidemia LDL goal <70 08/25/2017   NSTEMI (non-ST elevated myocardial infarction) (HCC) 06/12/2017   Chronic chest pain    Ureteral calculus 06/09/2017   Edema of extremities 02/16/2014   Sinusitis, chronic 11/11/2013   Dyspnea 11/11/2013   PVC's (premature ventricular contractions) 08/19/2013   Benign hypertensive heart disease without heart failure 08/15/2013    ONSET DATE: 09/15/2023 (referral date)  REFERRING DIAG: I63.9 (ICD-10-CM) - Cerebral infarction, unspecified mechanism (HCC)  THERAPY DIAG:  Abnormality of gait and mobility - Plan: PT plan of care cert/re-cert  Unsteadiness on feet - Plan: PT plan of care cert/re-cert  Muscle weakness (generalized) - Plan: PT plan of care cert/re-cert  Rationale for Evaluation and Treatment: Rehabilitation  SUBJECTIVE:                                                                                                                                                                                              SUBJECTIVE STATEMENT: Patient ambulates without AD into session. Patient reports that she is coming into PT as she had a "stroke in my eye." Patient reports on Palm Sunday, she realized she could not see out of her right eye. She reports that she had no pain at  the time. Patient reports that she thought her eye implants got in. Patient went in on Monday to eye doctor who then sent her to ER for stroke. Patient reports that since her stroke she has only occassional light perception in R eye. Patient had a stent put in place and was D/C with 2WW. Patient also arrives with patch on arm due to her reporting bumping into a railing at home as did not see it.   Pt accompanied by: self and husband in lobby - [place]   PERTINENT HISTORY: s/p right carotid stent placement, Central retinal artery occlusion secondary to acute CVA from carotid artery stenosis, pulmonary fibrosis, MI, RA  PAIN:  Are you having pain? No  PRECAUTIONS: Fall and R field visiual loss   RED FLAGS: None   WEIGHT BEARING RESTRICTIONS: No  FALLS: Has patient fallen in last 6 months? No  LIVING ENVIRONMENT: Lives with: lives with their spouse and 100 lb dog Lives in: House/apartment Stairs: Yes: External: 3 steps; on left going up Has following equipment at home: Single point cane, Walker - 2 wheeled, Environmental consultant - 4 wheeled, and Grab bars  PLOF: Independent - fully independent doing everything independently and was driving   PATIENT GOALS: "I don't think that I need physical therapy."   OBJECTIVE:  Note: Objective measures were completed at Evaluation unless otherwise noted.  DIAGNOSTIC FINDINGS:   09/14/2023 MR Head wo Contrast: IMPRESSION: 1. 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 2. Mild chronic small vessel ischemic disease. 3. Moderate left V4 stenosis.  Otherwise negative head MRA.  COGNITION: Overall cognitive status: Within functional limits  for tasks assessed   SENSATION: WFL  COORDINATION: Grossly WFL   EDEMA:  Mild edema at incision sight of vascular surgery   MUSCLE TONE: Grossly WFL  LOWER EXTREMITY ROM:     Grossly WFL   LOWER EXTREMITY MMT:    MMT Right Eval Left Eval  Hip flexion 4-/5 4-/5  Hip extension    Hip abduction 4-/5 4-/5  Hip adduction 4-/5 4-/5  Hip internal rotation    Hip external rotation    Knee flexion 4-/5 4-/5  Knee extension 4-/5 4-/5  Ankle dorsiflexion 4-/5 4-/5  Ankle plantarflexion    Ankle inversion    Ankle eversion    (Blank rows = not tested)  TRANSFERS: Sit to stand: Modified independence  Assistive device utilized: None     Stand to sit: Modified independence  Assistive device utilized: None     Chair to chair: SBA  Assistive device utilized: None       GAIT: Findings: Gait Characteristics: mild instability, decreased awareness of R visual field and step through pattern, Distance walked: various clinic distances, Assistive device utilized:None, Level of assistance: SBA, and Comments: min signs of instability, min verbal cues for visual scanning to R  FUNCTIONAL TESTS:  Deferred until patient follows up with PCP about BP management  PATIENT SURVEYS:  Total ABC score: 1020 / 1600 = 63.7 %   VITALS:        Vitals:   09/25/23 0912 09/25/23 0913  BP: (!) 115/53 (!) 110/48  Pulse: (!) 55 (!) 58     Seated     Standing  Denies feeling dizzy/lightheaded today but reports intermittent spells in the past; PT  TREATMENT:    Self Care: PT discusses POC and rational for holding functional outcome measures until patient follows up with PCP about BP readings low as patient recently been symptomatic for dizziness, PT educates on how low BP can impact function, patient verbalizes understanding and plans to follow up with PCP  PATIENT EDUCATION: Education  details: POC, goal collaboration, examination findings Person educated: Patient Education method: Explanation Education comprehension: verbalized understanding  HOME EXERCISE PROGRAM: To be provided   GOALS: Goals reviewed with patient? Yes  LONG TERM GOALS: Target date: 11/06/2023 (STG = LTG due to POC length)  Patient will report demonstrate independence with final HEP in order to maintain current gains and continue to progress after physical therapy discharge.   Baseline: To be provided  Goal status: INITIAL  2.  Patient will improve ABC Score to 67% or greater to indicate a decreased risk for falls and improved self-reported confidence in balance and sense of steadiness.   Baseline: 60% impairment Goal status: INITIAL  3.  to be assessed / LTG written Baseline:  Goal status: INITIAL  4.  FGA to be assessed / LTG written  Baseline:  Goal status: INITIAL  ASSESSMENT:  CLINICAL IMPRESSION: Patient is a 88 y.o. female who was seen today for physical therapy evaluation and treatment for impairments secondary to central retinal artery occlusion secondary to CVA 09/13/23 from carotid artery stenosis s/p right carotid stent placement. Patient reports being near funcitonal baseline but does report feeling generally unsteady. Was D/C from hospital stay by PT with recommendation for 2WW; patient not using at this time. Unsteadiness largely impacted by new right visual field loss following recent CVA and patient will benefit from brief POC for PT to decrease falls risk. Patient is at an increased risk for falls as indicated by score of 60% on ABC, Deferred functional outcomes on eval as patient diastolic reading was in low 50s/upper 40s and PT advised follow up with patient's PCP. Patient verbalizes understanding and plans to follow up today.  OBJECTIVE IMPAIRMENTS: Abnormal gait, decreased balance, decreased ROM, and impaired vision/preception.   ACTIVITY LIMITATIONS: carrying,  stairs, transfers, and locomotion level  PARTICIPATION LIMITATIONS: laundry, driving, and community activity  PERSONAL FACTORS: Age and 3+ comorbidities: see above  are also affecting patient's functional outcome.   REHAB POTENTIAL: Good  CLINICAL DECISION MAKING: Stable/uncomplicated  EVALUATION COMPLEXITY: Low  PLAN:  PT FREQUENCY: 1x/week  PT DURATION: 4 weeks  PLANNED INTERVENTIONS: 97164- PT Re-evaluation, 97110-Therapeutic exercises, 97530- Therapeutic activity, 97112- Neuromuscular re-education, 97535- Self Care, 16109- Manual therapy, and 97116- Gait training  PLAN FOR NEXT SESSION: monitor BP (trends low), did patient follow up with patient's PCP, assess and FGA, basic balance/strength HEP - corner balance, sit to stands, etc, work on visual scanning to the R and home safety setup    [employer], PT 09/25/2023, 9:51 AM

## 2023-09-29 ENCOUNTER — Encounter: Payer: Self-pay | Admitting: Physical Therapy

## 2023-09-29 ENCOUNTER — Ambulatory Visit: Admitting: Physical Therapy

## 2023-09-29 VITALS — BP 121/78 | HR 83

## 2023-09-29 DIAGNOSIS — R2681 Unsteadiness on feet: Secondary | ICD-10-CM

## 2023-09-29 DIAGNOSIS — R269 Unspecified abnormalities of gait and mobility: Secondary | ICD-10-CM | POA: Diagnosis not present

## 2023-09-29 DIAGNOSIS — Z008 Encounter for other general examination: Secondary | ICD-10-CM | POA: Diagnosis not present

## 2023-09-29 DIAGNOSIS — M6281 Muscle weakness (generalized): Secondary | ICD-10-CM | POA: Diagnosis not present

## 2023-09-29 DIAGNOSIS — I6521 Occlusion and stenosis of right carotid artery: Secondary | ICD-10-CM | POA: Diagnosis not present

## 2023-09-29 DIAGNOSIS — H3411 Central retinal artery occlusion, right eye: Secondary | ICD-10-CM | POA: Diagnosis not present

## 2023-09-29 DIAGNOSIS — N183 Chronic kidney disease, stage 3 unspecified: Secondary | ICD-10-CM | POA: Diagnosis not present

## 2023-09-29 DIAGNOSIS — I1310 Hypertensive heart and chronic kidney disease without heart failure, with stage 1 through stage 4 chronic kidney disease, or unspecified chronic kidney disease: Secondary | ICD-10-CM | POA: Diagnosis not present

## 2023-09-29 NOTE — Therapy (Signed)
 OUTPATIENT PHYSICAL THERAPY NEURO TREATMENT   Patient Name: Jacqueline Burke MRN: [id number] DOB:28-Jan-1936, 88 y.o., female Today's Date: 09/29/2023   PCP: Ransom Byers, MD REFERRING PROVIDER: Armenta Landau, MD  END OF SESSION:  PT End of Session - 09/29/23 0933     Visit Number 2    Number of Visits 5    Date for PT Re-Evaluation 11/06/23    Authorization Type [employer]    PT Start Time [id number]    PT Stop Time 1015    PT Time Calculation (min) 44 min    Equipment Utilized During Treatment Gait belt    Activity Tolerance Patient tolerated treatment well;Treatment limited secondary to medical complications (Comment)    Behavior During Therapy [facility] for tasks assessed/performed             Past Medical History:  Diagnosis Date   Allergic rhinitis    CAD (coronary artery disease), native coronary artery    NSTEMI with 95% RCA s/p DES to RCA 06/2017   Complication of anesthesia    makes her confused cursing etc..   Diverticulosis    GERD (gastroesophageal reflux disease)    Heart murmur    Systolic heart murmur with Aortic valve sclerosis by ECHO   History of kidney stones    Hyperlipidemia    Hypertension    Myocardial infarction (HCC)    Osteoarthritis    Erosive OA-MRI of R hand negative for synovitis, only OA-Dr Meredith Stalls   Osteopenia    Pelvic prolapse    PVC's (premature ventricular contractions)    Past Surgical History:  Procedure Laterality Date   BUNIONECTOMY  08/2006   R foot and hammer roe right second toe   CARDIAC CATHETERIZATION     CARPAL TUNNEL RELEASE  2002   bil hands   CONVERSION TO TOTAL HIP Left 08/08/2022   Procedure: CONVERSION TO TOTAL HIP FROM IM NAIL;  Surgeon: Murleen Arms, MD;  Location: [facility];  Service: Orthopedics;  Laterality: Left;   CORONARY STENT INTERVENTION N/A 06/15/2017   Procedure: CORONARY STENT INTERVENTION;  Surgeon: Avanell Leigh, MD;  Location: [facility];  Service: Cardiovascular;   Laterality: N/A;   CYSTOSCOPY     with laser lithotripsy Dr. Parke Boll 07-20-17    CYSTOSCOPY W/ URETERAL STENT PLACEMENT Left 06/09/2017   Procedure: CYSTOSCOPY WITH RETROGRADE PYELOGRAM/URETERAL STENT PLACEMENT;  Surgeon: Samson Croak, MD;  Location: [facility];  Service: Urology;  Laterality: Left;   CYSTOSCOPY WITH RETROGRADE PYELOGRAM, URETEROSCOPY AND STENT PLACEMENT Left 07/20/2017   Procedure: CYSTOSCOPY WITH RETROGRADE PYELOGRAM, LEFT URETEROSCOPY HOLMIUM LASER LITHO  AND STENT EXCHANGE;  Surgeon: Samson Croak, MD;  Location: [facility];  Service: Urology;  Laterality: Left;   HOLMIUM LASER APPLICATION Left 07/20/2017   Procedure: HOLMIUM LASER APPLICATION;  Surgeon: Samson Croak, MD;  Location: [facility];  Service: Urology;  Laterality: Left;   INTRAMEDULLARY (IM) NAIL INTERTROCHANTERIC Left 08/04/2020   Procedure: INTRAMEDULLARY (IM) NAIL INTERTROCHANTRIC WITH CABLES;  Surgeon: Jasmine Mesi, MD;  Location: [facility];  Service: Orthopedics;  Laterality: Left;   LEFT HEART CATH AND CORONARY ANGIOGRAPHY N/A 06/15/2017   Procedure: LEFT HEART CATH AND CORONARY ANGIOGRAPHY;  Surgeon: Avanell Leigh, MD;  Location: [facility];  Service: Cardiovascular;  Laterality: N/A;   OPEN REDUCTION INTERNAL FIXATION (ORIF) DISTAL RADIAL FRACTURE Left 10/23/2022   Procedure: OPEN REDUCTION INTERNAL FIXATION (ORIF) DISTAL RADIUS FRACTURE;  Surgeon: Osa Blase, MD;  Location: [facility];  Service: Orthopedics;  Laterality: Left;   TOTAL KNEE ARTHROPLASTY Left 08/2007   TOTAL KNEE ARTHROPLASTY Right 03/2010   TRANSCAROTID ARTERY REVASCULARIZATION  Right 09/17/2023   Procedure: TRANSCAROTID ARTERY REVASCULARIZATION (TCAR);  Surgeon: Philipp Brawn, MD;  Location: [facility] OR;  Service: Vascular;  Laterality: Right;   ULTRASOUND GUIDANCE FOR VASCULAR ACCESS Left 09/17/2023   Procedure: ULTRASOUND GUIDANCE, FOR VASCULAR ACCESS;  Surgeon: Philipp Brawn, MD;  Location: [facility] OR;  Service: Vascular;   Laterality: Left;   Patient Active Problem List   Diagnosis Date Noted   Acute CVA (cerebrovascular accident) (HCC) 09/15/2023   Symptomatic carotid artery stenosis 09/15/2023   Hypomagnesemia 09/15/2023   CRAO (central retinal artery occlusion), right 09/14/2023   RA (rheumatoid arthritis) (HCC) 09/14/2023   Hyperkalemia 09/14/2023   AKI (acute kidney injury) (HCC) 09/14/2023   Senile osteoporosis 01/16/2023   Osteoarthritis of left hip, unspecified osteoarthritis type 08/08/2022   Nocturnal dyspnea 10/22/2021   COPD (chronic obstructive pulmonary disease) (HCC) 02/01/2021   Normochromic normocytic anemia 08/09/2020   Essential hypertension 08/03/2020   CKD (chronic kidney disease), stage III (HCC) 08/03/2020   Closed intertrochanteric fracture of hip, left, initial encounter (HCC) 08/03/2020   Pulmonary fibrosis (HCC) 08/03/2020   Cough 03/20/2020   CAD (coronary artery disease), native coronary artery 08/25/2017   Hyperlipidemia LDL goal <70 08/25/2017   NSTEMI (non-ST elevated myocardial infarction) (HCC) 06/12/2017   Chronic chest pain    Ureteral calculus 06/09/2017   Edema of extremities 02/16/2014   Sinusitis, chronic 11/11/2013   Dyspnea 11/11/2013   PVC's (premature ventricular contractions) 08/19/2013   Benign hypertensive heart disease without heart failure 08/15/2013    ONSET DATE: 09/15/2023 (referral date)  REFERRING DIAG: I63.9 (ICD-10-CM) - Cerebral infarction, unspecified mechanism (HCC)  THERAPY DIAG:  Abnormality of gait and mobility  Unsteadiness on feet  Muscle weakness (generalized)  Rationale for Evaluation and Treatment: Rehabilitation  SUBJECTIVE:                                                                                                                                                                                             SUBJECTIVE STATEMENT: Patient reports that she followed up with her doctor. She reports that she received an I  message from her doctor but wasn't able to access it. She plans to stop by the doctor's office afterwards. Patient reports that her pulse was getting in the 70s and 80s which is very elevated for her.   Pt accompanied by: self and husband in lobby - [place]   PERTINENT HISTORY: s/p right carotid stent placement, Central retinal artery  occlusion secondary to acute CVA from carotid artery stenosis, pulmonary fibrosis, MI, RA  PAIN:  Are you having pain? No  PRECAUTIONS: Fall and R field visiual loss   RED FLAGS: None   WEIGHT BEARING RESTRICTIONS: No  FALLS: Has patient fallen in last 6 months? No  LIVING ENVIRONMENT: Lives with: lives with their spouse and 100 lb dog Lives in: House/apartment Stairs: Yes: External: 3 steps; on left going up Has following equipment at home: Single point cane, Walker - 2 wheeled, Environmental consultant - 4 wheeled, and Grab bars  PLOF: Independent - fully independent doing everything independently and was driving   PATIENT GOALS: "I don't think that I need physical therapy."   OBJECTIVE:  Note: Objective measures were completed at Evaluation unless otherwise noted.  DIAGNOSTIC FINDINGS:   09/14/2023 MR Head wo Contrast: IMPRESSION: 1. 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 2. Mild chronic small vessel ischemic disease. 3. Moderate left V4 stenosis.  Otherwise negative head MRA.  COGNITION: Overall cognitive status: Within functional limits for tasks assessed            TREATMENT:    Self Care:     09/29/2023    9:40 AM 09/29/2023    9:39 AM 09/29/2023    9:38 AM  Vitals with BMI  Systolic 121 119 161  Diastolic 78 72 74  Pulse 83 82 80      Standing 2 min  Standing immediately Seated  Vitals assessed as noted above Asymptomatic and stabilizes with prolonged standing, educated on pacing and time and safety measures with BP drops when first coming to stand   TherAct: Functional outcome goals check  [facility] PT Assessment  - 09/29/23 0001       Standardized Balance Assessment   Standardized Balance Assessment 10 meter walk test    10 Meter Walk 0.93 m/s   without AD (SBA due to R visiual field loss for safety in gym)     Functional Gait  Assessment   Gait assessed  Yes    Gait Level Surface Walks 20 ft in less than 7 sec but greater than 5.5 sec, uses assistive device, slower speed, mild gait deviations, or deviates 6-10 in outside of the 12 in walkway width.   5.7 seconds mild lateral weaving   Change in Gait Speed Able to smoothly change walking speed without loss of balance or gait deviation. Deviate no more than 6 in outside of the 12 in walkway width.    Gait with Horizontal Head Turns Performs head turns smoothly with no change in gait. Deviates no more than 6 in outside 12 in walkway width    Gait with Vertical Head Turns Performs head turns with no change in gait. Deviates no more than 6 in outside 12 in walkway width.    Gait and Pivot Turn Pivot turns safely in greater than 3 sec and stops with no loss of balance, or pivot turns safely within 3 sec and stops with mild imbalance, requires small steps to catch balance.    Step Over Obstacle Is able to step over one shoe box (4.5 in total height) but must slow down and adjust steps to clear box safely. May require verbal cueing.   slows down pace   Gait with Narrow Base of Support Ambulates 4-7 steps.   4 steps   Gait with Eyes Closed Walks 20 ft, slow speed, abnormal gait pattern, evidence for imbalance, deviates 10-15 in outside 12 in walkway width. Requires  more than 9 sec to ambulate 20 ft.   12 seconds   Ambulating Backwards Walks 20 ft, uses assistive device, slower speed, mild gait deviations, deviates 6-10 in outside 12 in walkway width.   slow pace   Steps Two feet to a stair, must use rail.   step to on way down due to chronic knee pain, pre-existing and likely will not change   Total Score 19    FGA comment: 19/30 =increased risk for falls   may  be somewhat near platue due to appropriate safety pace for vision loss           NMR:  HEP Exercises reviewed in session, corner balance SBA - Semi-Tandem Corner Balance With Eyes Closed  - 3 sets - 30 seconds hold - Foam Balance Narrow stance - 3 sets - 30 seconds  hold - Romberg Stance on Foam Pad with Head Rotation  - 3 sets - 10 reps   Discussed corner balance setup for safety with HEP at home Discussed vision changes at end of session, patient with challenging time reading small print, has glasses at home, recommend trying and to let PT know how they are going     PATIENT EDUCATION: Education details: Initial HEP + BP safety  Person educated: Patient Education method: Explanation Education comprehension: verbalized understanding  HOME EXERCISE PROGRAM: Access Code: [email or web address] URL: [email or web address] Date: 09/29/2023 Prepared by: Camella Cave  Exercises - Semi-Tandem Corner Balance With Eyes Closed  - 1 x daily - 7 x weekly - 3 sets - 30 seconds hold - Foam Balance Narrow stance  - 1 x daily - 7 x weekly - 3 sets - 30 seconds  hold - Romberg Stance on Foam Pad with Head Rotation  - 1 x daily - 7 x weekly - 3 sets - 10 reps  GOALS: Goals reviewed with patient? Yes  LONG TERM GOALS: Target date: 11/06/2023 (STG = LTG due to POC length)  Patient will report demonstrate independence with final HEP in order to maintain current gains and continue to progress after physical therapy discharge.   Baseline: To be provided  Goal status: INITIAL  2.  Patient will improve ABC Score to 67% or greater to indicate a decreased risk for falls and improved self-reported confidence in balance and sense of steadiness.   Baseline: 60% impairment Goal status: INITIAL  3.  to be assessed / LTG written Baseline: 0.93 m/s with SBA Goal status:D/C appropriate pace for age with scanning for safety  4.  Patient will improve FGA to greater than 22/30 to indicate a decreased  risk of falls and improved dynamic stability.  Baseline: 19/30 Goal status: INITIAL  ASSESSMENT:  CLINICAL IMPRESSION: Emphasis of skilled physical therapy services on assessment of patient's gait speed and FGA, patient is at an increased risk for falls; however, some scores likely impacted by reduction in pace for safety with visual changes and may be near functional plateu. Did initiate corner balance HEP for home. Patient does still demonstrate BP drop when going to stand but is orthostatic and does stabilize with exercise/standing. Continue POC.   OBJECTIVE IMPAIRMENTS: Abnormal gait, decreased balance, decreased ROM, and impaired vision/preception.   ACTIVITY LIMITATIONS: carrying, stairs, transfers, and locomotion level  PARTICIPATION LIMITATIONS: laundry, driving, and community activity  PERSONAL FACTORS: Age and 3+ comorbidities: see above  are also affecting patient's functional outcome.   REHAB POTENTIAL: Good  CLINICAL DECISION MAKING: Stable/uncomplicated  EVALUATION COMPLEXITY: Low  PLAN:  PT FREQUENCY: 1x/week  PT DURATION: 4 weeks  PLANNED INTERVENTIONS: 97164- PT Re-evaluation, 97110-Therapeutic exercises, 97530- Therapeutic activity, 97112- Neuromuscular re-education, 97535- Self Care, 16109- Manual therapy, and 97116- Gait training  PLAN FOR NEXT SESSION: monitor BP, did patient follow up with patient's PCP, add to HEP - sit to stands and tandem walking at counter, etc, work on visual scanning to the R and home safety setup with trial of blaze pod tasks looking to the R, progress balance on compliant surface, lateral stepping with ball toss     Coreen Devoid, PT, DPT 09/29/2023, 10:53 AM

## 2023-10-06 ENCOUNTER — Ambulatory Visit: Attending: Internal Medicine | Admitting: Physical Therapy

## 2023-10-06 ENCOUNTER — Encounter: Payer: Self-pay | Admitting: Physical Therapy

## 2023-10-06 VITALS — BP 127/83 | HR 77

## 2023-10-06 DIAGNOSIS — R269 Unspecified abnormalities of gait and mobility: Secondary | ICD-10-CM | POA: Insufficient documentation

## 2023-10-06 DIAGNOSIS — R2681 Unsteadiness on feet: Secondary | ICD-10-CM | POA: Diagnosis not present

## 2023-10-06 DIAGNOSIS — M6281 Muscle weakness (generalized): Secondary | ICD-10-CM | POA: Insufficient documentation

## 2023-10-06 NOTE — Therapy (Signed)
 OUTPATIENT PHYSICAL THERAPY NEURO TREATMENT   Patient Name: Jacqueline Burke MRN: [id number] DOB:04-Nov-1935, 88 y.o., female Today's Date: 10/06/2023   PCP: Ransom Byers, MD REFERRING PROVIDER: Armenta Landau, MD  END OF SESSION:  PT End of Session - 10/06/23 0935     Visit Number 3    Number of Visits 5    Date for PT Re-Evaluation 11/06/23    Authorization Type [employer]    PT Start Time [id number]    PT Stop Time 1021    PT Time Calculation (min) 46 min    Equipment Utilized During Treatment Gait belt    Activity Tolerance Patient tolerated treatment well;Treatment limited secondary to medical complications (Comment)    Behavior During Therapy [facility] for tasks assessed/performed             Past Medical History:  Diagnosis Date   Allergic rhinitis    CAD (coronary artery disease), native coronary artery    NSTEMI with 95% RCA s/p DES to RCA 06/2017   Complication of anesthesia    makes her confused cursing etc..   Diverticulosis    GERD (gastroesophageal reflux disease)    Heart murmur    Systolic heart murmur with Aortic valve sclerosis by ECHO   History of kidney stones    Hyperlipidemia    Hypertension    Myocardial infarction (HCC)    Osteoarthritis    Erosive OA-MRI of R hand negative for synovitis, only OA-Dr Meredith Stalls   Osteopenia    Pelvic prolapse    PVC's (premature ventricular contractions)    Past Surgical History:  Procedure Laterality Date   BUNIONECTOMY  08/2006   R foot and hammer roe right second toe   CARDIAC CATHETERIZATION     CARPAL TUNNEL RELEASE  2002   bil hands   CONVERSION TO TOTAL HIP Left 08/08/2022   Procedure: CONVERSION TO TOTAL HIP FROM IM NAIL;  Surgeon: Murleen Arms, MD;  Location: [facility];  Service: Orthopedics;  Laterality: Left;   CORONARY STENT INTERVENTION N/A 06/15/2017   Procedure: CORONARY STENT INTERVENTION;  Surgeon: Avanell Leigh, MD;  Location: [facility];  Service: Cardiovascular;   Laterality: N/A;   CYSTOSCOPY     with laser lithotripsy Dr. Parke Boll 07-20-17    CYSTOSCOPY W/ URETERAL STENT PLACEMENT Left 06/09/2017   Procedure: CYSTOSCOPY WITH RETROGRADE PYELOGRAM/URETERAL STENT PLACEMENT;  Surgeon: Samson Croak, MD;  Location: [facility];  Service: Urology;  Laterality: Left;   CYSTOSCOPY WITH RETROGRADE PYELOGRAM, URETEROSCOPY AND STENT PLACEMENT Left 07/20/2017   Procedure: CYSTOSCOPY WITH RETROGRADE PYELOGRAM, LEFT URETEROSCOPY HOLMIUM LASER LITHO  AND STENT EXCHANGE;  Surgeon: Samson Croak, MD;  Location: [facility];  Service: Urology;  Laterality: Left;   HOLMIUM LASER APPLICATION Left 07/20/2017   Procedure: HOLMIUM LASER APPLICATION;  Surgeon: Samson Croak, MD;  Location: [facility];  Service: Urology;  Laterality: Left;   INTRAMEDULLARY (IM) NAIL INTERTROCHANTERIC Left 08/04/2020   Procedure: INTRAMEDULLARY (IM) NAIL INTERTROCHANTRIC WITH CABLES;  Surgeon: Jasmine Mesi, MD;  Location: [facility];  Service: Orthopedics;  Laterality: Left;   LEFT HEART CATH AND CORONARY ANGIOGRAPHY N/A 06/15/2017   Procedure: LEFT HEART CATH AND CORONARY ANGIOGRAPHY;  Surgeon: Avanell Leigh, MD;  Location: [facility];  Service: Cardiovascular;  Laterality: N/A;   OPEN REDUCTION INTERNAL FIXATION (ORIF) DISTAL RADIAL FRACTURE Left 10/23/2022   Procedure: OPEN REDUCTION INTERNAL FIXATION (ORIF) DISTAL RADIUS FRACTURE;  Surgeon: Osa Blase, MD;  Location: [facility];  Service: Orthopedics;  Laterality: Left;   TOTAL KNEE ARTHROPLASTY Left 08/2007   TOTAL KNEE ARTHROPLASTY Right 03/2010   TRANSCAROTID ARTERY REVASCULARIZATION  Right 09/17/2023   Procedure: TRANSCAROTID ARTERY REVASCULARIZATION (TCAR);  Surgeon: Philipp Brawn, MD;  Location: [facility];  Service: Vascular;  Laterality: Right;   ULTRASOUND GUIDANCE FOR VASCULAR ACCESS Left 09/17/2023   Procedure: ULTRASOUND GUIDANCE, FOR VASCULAR ACCESS;  Surgeon: Philipp Brawn, MD;  Location: [facility];  Service: Vascular;   Laterality: Left;   Patient Active Problem List   Diagnosis Date Noted   Acute CVA (cerebrovascular accident) (HCC) 09/15/2023   Symptomatic carotid artery stenosis 09/15/2023   Hypomagnesemia 09/15/2023   CRAO (central retinal artery occlusion), right 09/14/2023   RA (rheumatoid arthritis) (HCC) 09/14/2023   Hyperkalemia 09/14/2023   AKI (acute kidney injury) (HCC) 09/14/2023   Senile osteoporosis 01/16/2023   Osteoarthritis of left hip, unspecified osteoarthritis type 08/08/2022   Nocturnal dyspnea 10/22/2021   COPD (chronic obstructive pulmonary disease) (HCC) 02/01/2021   Normochromic normocytic anemia 08/09/2020   Essential hypertension 08/03/2020   CKD (chronic kidney disease), stage III (HCC) 08/03/2020   Closed intertrochanteric fracture of hip, left, initial encounter (HCC) 08/03/2020   Pulmonary fibrosis (HCC) 08/03/2020   Cough 03/20/2020   CAD (coronary artery disease), native coronary artery 08/25/2017   Hyperlipidemia LDL goal <70 08/25/2017   NSTEMI (non-ST elevated myocardial infarction) (HCC) 06/12/2017   Chronic chest pain    Ureteral calculus 06/09/2017   Edema of extremities 02/16/2014   Sinusitis, chronic 11/11/2013   Dyspnea 11/11/2013   PVC's (premature ventricular contractions) 08/19/2013   Benign hypertensive heart disease without heart failure 08/15/2013    ONSET DATE: 09/15/2023 (referral date)  REFERRING DIAG: I63.9 (ICD-10-CM) - Cerebral infarction, unspecified mechanism (HCC)  THERAPY DIAG:  Abnormality of gait and mobility  Unsteadiness on feet  Muscle weakness (generalized)  Rationale for Evaluation and Treatment: Rehabilitation  SUBJECTIVE:                                                                                                                                                                                             SUBJECTIVE STATEMENT: Patient reports that she is doing good. Denies falls and near falls. Patient reports that  she is feeling okay. Patient denies any falls and near falls since last here.   Pt accompanied by: self and husband in lobby - [place]   PERTINENT HISTORY: s/p right carotid stent placement, Central retinal artery occlusion secondary to acute CVA from carotid artery stenosis, pulmonary fibrosis, MI, RA  PAIN:  Are you having pain? No  PRECAUTIONS: Fall  and R field visiual loss   RED FLAGS: None   WEIGHT BEARING RESTRICTIONS: No  FALLS: Has patient fallen in last 6 months? No  LIVING ENVIRONMENT: Lives with: lives with their spouse and 100 lb dog Lives in: House/apartment Stairs: Yes: External: 3 steps; on left going up Has following equipment at home: Single point cane, Walker - 2 wheeled, Environmental consultant - 4 wheeled, and Grab bars  PLOF: Independent - fully independent doing everything independently and was driving   PATIENT GOALS: "I don't think that I need physical therapy."   OBJECTIVE:  Note: Objective measures were completed at Evaluation unless otherwise noted.  DIAGNOSTIC FINDINGS:   09/14/2023 MR Head wo Contrast: IMPRESSION: 1. 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 2. Mild chronic small vessel ischemic disease. 3. Moderate left V4 stenosis.  Otherwise negative head MRA.  COGNITION: Overall cognitive status: Within functional limits for tasks assessed            TREATMENT:    Self Care:  Vitals:   10/06/23 0938 10/06/23 0940  BP: (!) 150/88 127/83  Pulse: 75 77      Seated      Standing    Vitals assessed as noted above on RUE; asymptomatic with standing, PT recommends follow up with medical doctor to inform of BP fluctuations in standing; patient verabalizes understanding and will call to follow up  SPO2 at end of session assessed and running between 93-97%, recommend getting pulse ox out to monitor at home, patient verbalizes understanding   NMR:  HEP Exercises reviewed in session, corner balance SBA - Tandem walking with SBA and  intermittent touch down support 6 x 8 feet - Marching near counter with UE support 2 x 20  Reviewed safety with HEP in home environment with balance tasks   TherAct: - Sit to stand without UE support with emphasis on eccentric lower for quad control and safety with transfers 2 x 10 reps  - Trekking pole training in preparation for the beach outdoors through grass, gravel, rubber mulch, and sidewalks x `1000 feet - patient SBA on soft surfaces with bilateral trekking poles and mix of CGA-SBA with single trekking pole, would recommend bilateral trekking poles for use of upcoming beach trip in sand, patient not wanting to get so discussed alternative measures and spoke with spouse about CGA-minA with use of cane on opposite side - Reviwed safety on sidewalks given visual loss on R and patient to have spouse stand on outside of sidewalk for safety so she doesn't step off   PATIENT EDUCATION: Education details: Continue HEP + additions + safety with trip Person educated: Patient Education method: Explanation and Handouts Education comprehension: verbalized understanding  HOME EXERCISE PROGRAM: Access Code: [email or web address] URL: [email or web address] Date: 10/06/2023 Prepared by: Camella Cave  Exercises - Semi-Tandem Corner Balance With Eyes Closed  - 1 x daily - 7 x weekly - 3 sets - 30 seconds hold - Foam Balance Narrow stance  - 1 x daily - 7 x weekly - 3 sets - 30 seconds  hold - Romberg Stance on Foam Pad with Head Rotation  - 1 x daily - 7 x weekly - 3 sets - 10 reps - Tandem Walking with Counter Support  - 1 x daily - 7 x weekly - 3 sets - Standing Marching  - 1 x daily - 7 x weekly - 2 sets - 20 reps - Sit to Stand Without Arm Support  - 1 x daily - 4 x  weekly - 3 sets - 10 reps  GOALS: Goals reviewed with patient? Yes  LONG TERM GOALS: Target date: 11/06/2023 (STG = LTG due to POC length)  Patient will report demonstrate independence with final HEP in order to maintain  current gains and continue to progress after physical therapy discharge.   Baseline: To be provided  Goal status: INITIAL  2.  Patient will improve ABC Score to 67% or greater to indicate a decreased risk for falls and improved self-reported confidence in balance and sense of steadiness.   Baseline: 60% impairment Goal status: INITIAL  3.  to be assessed / LTG written Baseline: 0.93 m/s with SBA Goal status:D/C appropriate pace for age with scanning for safety  4.  Patient will improve FGA to greater than 22/30 to indicate a decreased risk of falls and improved dynamic stability.  Baseline: 19/30 Goal status: INITIAL  ASSESSMENT:  CLINICAL IMPRESSION: Emphasis of skilled physical therapy on dynamic balance and safety for upcoming beach trip. Reviewed use of trekking poles vs cane and minA from caregiver and PT would prefer trekking poles but patient not interested at this time. Patient tolerated session very well. Did recommend close monitoring of SPO2 as dipped to 93% in today's session. Continue POC.   OBJECTIVE IMPAIRMENTS: Abnormal gait, decreased balance, decreased ROM, and impaired vision/preception.   ACTIVITY LIMITATIONS: carrying, stairs, transfers, and locomotion level  PARTICIPATION LIMITATIONS: laundry, driving, and community activity  PERSONAL FACTORS: Age and 3+ comorbidities: see above  are also affecting patient's functional outcome.   REHAB POTENTIAL: Good  CLINICAL DECISION MAKING: Stable/uncomplicated  EVALUATION COMPLEXITY: Low  PLAN:  PT FREQUENCY: 1x/week  PT DURATION: 4 weeks  PLANNED INTERVENTIONS: 97164- PT Re-evaluation, 97110-Therapeutic exercises, 97530- Therapeutic activity, 97112- Neuromuscular re-education, 97535- Self Care, 24401- Manual therapy, and 97116- Gait training  PLAN FOR NEXT SESSION: monitor BP and SPO2, did patient follow up with patient's PCP, work on visual scanning to the R and home safety setup with trial of blaze pod  tasks looking to the R, progress balance on compliant surface, lateral stepping with ball toss     Coreen Devoid, PT, DPT 10/06/2023, 10:58 AM

## 2023-10-07 ENCOUNTER — Encounter: Payer: Self-pay | Admitting: Adult Health

## 2023-10-07 ENCOUNTER — Other Ambulatory Visit: Payer: Self-pay

## 2023-10-07 ENCOUNTER — Ambulatory Visit (INDEPENDENT_AMBULATORY_CARE_PROVIDER_SITE_OTHER): Admitting: Adult Health

## 2023-10-07 VITALS — BP 140/82 | HR 84 | Ht 59.0 in | Wt 127.4 lb

## 2023-10-07 DIAGNOSIS — H3411 Central retinal artery occlusion, right eye: Secondary | ICD-10-CM | POA: Diagnosis not present

## 2023-10-07 DIAGNOSIS — H5461 Unqualified visual loss, right eye, normal vision left eye: Secondary | ICD-10-CM

## 2023-10-07 DIAGNOSIS — Z9862 Peripheral vascular angioplasty status: Secondary | ICD-10-CM

## 2023-10-07 DIAGNOSIS — I63231 Cerebral infarction due to unspecified occlusion or stenosis of right carotid arteries: Secondary | ICD-10-CM | POA: Diagnosis not present

## 2023-10-07 DIAGNOSIS — I6523 Occlusion and stenosis of bilateral carotid arteries: Secondary | ICD-10-CM

## 2023-10-07 NOTE — Progress Notes (Signed)
 [facility] [address] [place]. [place] [address] [phone]       HOSPITAL FOLLOW UP NOTE  Ms. Jacqueline Burke Date of Birth:  [birth year] Medical Record Number:  [id number]   Reason for Referral:  hospital stroke follow up    SUBJECTIVE:   CHIEF COMPLAINT:  Chief Complaint  Patient presents with   Follow-up    RM 8, PT alone. Here for hospital f/u. Pt states was in hospital for a clot that moved causing her to lose sight in Right eye. Reports still no vision at all in right eye.     HPI:   Ms. Jacqueline Burke is a 88 y.o. female with history of hypertension, hyperlipidemia, CAD, CKD 3, COPD who presented to ED on 09/14/2023 with right eye vision loss. Was seen by ophthalmology who noted CRAO and sent to ED for further evaluation.  Stroke work up showed punctate right CR infarct, artery to artery embolic secondary to right ICA stenosis. CTA right ICA 50% stenosis, moderate left V4 stenosis, severe stenosis right M2 inferior branch. Carotid doppler right ICA 40-59% stenosis. EF 60-65%. LDL 57. A1c 5.8. Suspected vision loss in setting of CRAO. Vascular surgery consult and underwent R TCAR on 4/17. On DAPT post procedure and initiated atorvastatin  80mg  daily. Therapies recommended outpatient PT//OT.    Today, 10/07/2023, patient is being seen for hospital follow up unaccompanied.  Reports overall doing well since discharge.  She reports continued right eye visual loss, no change since discharge.  Has not yet had follow-up visit with ophthalmology.  Has been working with PT for balance, does not feel she is currently having any balance issues. Denies any weakness, numbness/tingling, speech changes or cognitive impairment.  She remains very active. She lives with her husband.  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  No new stroke/TIA symptoms.  Reports compliance on aspirin , Plavix  and atorvastatin  without side  effects.  Blood pressure monitored at home and typically stable.  Has since seen PCP Dr. Donia Furlough since discharge.  Has follow-up visit with vascular surgery next week.  Routinely follows with cardiology with follow-up visit this month.  No questions or concerns at this time.     PERTINENT IMAGING  MRI right CR punctate infarct MRA head moderate left V4 stenosis CTA head and neck about 50% stenosis at right ICA origin, moderate left V4 stenosis, severe stenosis right M2 inferior branch Carotid Doppler right ICA 40 to 59% stenosis 2D Echo EF 60 to 65% LDL 57 HgbA1c 5.8 UDS negative    ROS:   14 system review of systems performed and negative with exception of those listed in HPI  PMH:  Past Medical History:  Diagnosis Date   Allergic rhinitis    CAD (coronary artery disease), native coronary artery    NSTEMI with 95% RCA s/p DES to RCA 06/2017   Complication of anesthesia    makes her confused cursing etc..   Diverticulosis    GERD (gastroesophageal reflux disease)    Heart murmur    Systolic heart murmur with Aortic valve sclerosis by ECHO   History of kidney stones    Hyperlipidemia    Hypertension    Myocardial infarction (HCC)    Osteoarthritis    Erosive OA-MRI of R hand negative for synovitis, only OA-Dr Meredith Stalls   Osteopenia    Pelvic prolapse    PVC's (premature ventricular contractions)     PSH:  Past Surgical History:  Procedure Laterality Date   BUNIONECTOMY  08/2006   R foot and hammer roe right second toe   CARDIAC CATHETERIZATION     CARPAL TUNNEL RELEASE  2002   bil hands   CONVERSION TO TOTAL HIP Left 08/08/2022   Procedure: CONVERSION TO TOTAL HIP FROM IM NAIL;  Surgeon: Murleen Arms, MD;  Location: [facility];  Service: Orthopedics;  Laterality: Left;   CORONARY STENT INTERVENTION N/A 06/15/2017   Procedure: CORONARY STENT INTERVENTION;  Surgeon: Avanell Leigh, MD;  Location: [facility];  Service: Cardiovascular;  Laterality: N/A;    CYSTOSCOPY     with laser lithotripsy Dr. Parke Boll 07-20-17    CYSTOSCOPY W/ URETERAL STENT PLACEMENT Left 06/09/2017   Procedure: CYSTOSCOPY WITH RETROGRADE PYELOGRAM/URETERAL STENT PLACEMENT;  Surgeon: Samson Croak, MD;  Location: [facility];  Service: Urology;  Laterality: Left;   CYSTOSCOPY WITH RETROGRADE PYELOGRAM, URETEROSCOPY AND STENT PLACEMENT Left 07/20/2017   Procedure: CYSTOSCOPY WITH RETROGRADE PYELOGRAM, LEFT URETEROSCOPY HOLMIUM LASER LITHO  AND STENT EXCHANGE;  Surgeon: Samson Croak, MD;  Location: [facility];  Service: Urology;  Laterality: Left;   HOLMIUM LASER APPLICATION Left 07/20/2017   Procedure: HOLMIUM LASER APPLICATION;  Surgeon: Samson Croak, MD;  Location: [facility];  Service: Urology;  Laterality: Left;   INTRAMEDULLARY (IM) NAIL INTERTROCHANTERIC Left 08/04/2020   Procedure: INTRAMEDULLARY (IM) NAIL INTERTROCHANTRIC WITH CABLES;  Surgeon: Jasmine Mesi, MD;  Location: [facility];  Service: Orthopedics;  Laterality: Left;   LEFT HEART CATH AND CORONARY ANGIOGRAPHY N/A 06/15/2017   Procedure: LEFT HEART CATH AND CORONARY ANGIOGRAPHY;  Surgeon: Avanell Leigh, MD;  Location: [facility];  Service: Cardiovascular;  Laterality: N/A;   OPEN REDUCTION INTERNAL FIXATION (ORIF) DISTAL RADIAL FRACTURE Left 10/23/2022   Procedure: OPEN REDUCTION INTERNAL FIXATION (ORIF) DISTAL RADIUS FRACTURE;  Surgeon: Osa Blase, MD;  Location: [facility];  Service: Orthopedics;  Laterality: Left;   TOTAL KNEE ARTHROPLASTY Left 08/2007   TOTAL KNEE ARTHROPLASTY Right 03/2010   TRANSCAROTID ARTERY REVASCULARIZATION  Right 09/17/2023   Procedure: TRANSCAROTID ARTERY REVASCULARIZATION (TCAR);  Surgeon: Philipp Brawn, MD;  Location: [facility] OR;  Service: Vascular;  Laterality: Right;   ULTRASOUND GUIDANCE FOR VASCULAR ACCESS Left 09/17/2023   Procedure: ULTRASOUND GUIDANCE, FOR VASCULAR ACCESS;  Surgeon: Philipp Brawn, MD;  Location: [facility];  Service: Vascular;  Laterality: Left;    Social  History:  Social History   Socioeconomic History   Marital status: Married    Spouse name: Not on file   Number of children: Not on file   Years of education: Not on file   Highest education level: Not on file  Occupational History   Occupation: Retired  Tobacco Use   Smoking status: Former    Current packs/day: 0.00    Average packs/day: 1 pack/day for 25.0 years (25.0 ttl pk-yrs)    Types: Cigarettes    Start date: 06/02/1968    Quit date: 06/02/1993    Years since quitting: 30.3   Smokeless tobacco: Never  Vaping Use   Vaping status: Never Used  Substance and Sexual Activity   Alcohol  use: No   Drug use: No   Sexual activity: Not Currently  Other Topics Concern   Not on file  Social History Narrative   Not on file   Social Drivers of Health   Financial Resource Strain: Not on file  Food Insecurity: No Food Insecurity (09/15/2023)   Hunger  Vital Sign    Worried About Programme researcher, broadcasting/film/video in the Last Year: Never true    Ran Out of Food in the Last Year: Never true  Transportation Needs: No Transportation Needs (09/15/2023)   PRAPARE - Administrator, Civil Service (Medical): No    Lack of Transportation (Non-Medical): No  Physical Activity: Inactive (10/29/2017)   Exercise Vital Sign    Days of Exercise per Week: 0 days    Minutes of Exercise per Session: 0 min  Stress: No Stress Concern Present (10/29/2017)   [employer] of Occupational Health - Occupational Stress Questionnaire    Feeling of Stress : Only a little  Social Connections: Moderately Integrated (09/15/2023)   Social Connection and Isolation Panel [NHANES]    Frequency of Communication with Friends and Family: More than three times a week    Frequency of Social Gatherings with Friends and Family: Once a week    Attends Religious Services: More than 4 times per year    Active Member of [employer] or Organizations: No    Attends Banker Meetings: Never    Marital Status: Married   Catering manager Violence: Not At Risk (09/15/2023)   Humiliation, Afraid, Rape, and Kick questionnaire    Fear of Current or Ex-Partner: No    Emotionally Abused: No    Physically Abused: No    Sexually Abused: No    Family History:  Family History  Problem Relation Age of Onset   Stroke Mother    Diabetes Mother    Heart attack Mother    Osteoporosis Father    Breast cancer Cousin     Medications:   Current Outpatient Medications on File Prior to Visit  Medication Sig Dispense Refill   acetaminophen  (TYLENOL ) 325 MG tablet Take 325 mg by mouth at bedtime.     Albuterol  Sulfate (PROAIR  RESPICLICK) 108 (90 Base) MCG/ACT AEPB Inhale 1 puff into the lungs daily as needed (wheezing). 1 each 0   aspirin  EC 81 MG tablet Take 1 tablet (81 mg total) by mouth daily. Swallow whole. 30 tablet 12   atorvastatin  (LIPITOR ) 80 MG tablet TAKE 1 TABLET BY MOUTH ONCE DAILY AT  6  PM 90 tablet 2   cetirizine  (ZYRTEC ) 10 MG chewable tablet Chew 1 tablet (10 mg total) by mouth daily. 30 tablet 0   Cholecalciferol  (VITAMIN D  PO) Take 3,000 Units by mouth daily.      clopidogrel  (PLAVIX ) 75 MG tablet Take 1 tablet by mouth once daily 90 tablet 2   Cyanocobalamin  5000 MCG TBDP Take 5,000 mcg by mouth daily.     docusate sodium  (COLACE) 100 MG capsule Take 1 capsule (100 mg total) by mouth daily. 10 capsule 0   gabapentin  (NEURONTIN ) 300 MG capsule Take 2 capsules (600 mg total) by mouth daily. (Patient taking differently: Take 300 mg by mouth at bedtime.) 60 capsule 0   guaiFENesin  (MUCINEX ) 600 MG 12 hr tablet Take 1 tablet (600 mg total) by mouth 2 (two) times daily. (Patient taking differently: Take 600 mg by mouth 2 (two) times daily as needed for to loosen phlegm.) 60 tablet 0   metoprolol  tartrate (LOPRESSOR ) 25 MG tablet Take 0.5 tablets (12.5 mg total) by mouth 2 (two) times daily.     Multiple Vitamin (MULTIVITAMIN) capsule Take 1 capsule by mouth daily.     nitroGLYCERIN  (NITROSTAT ) 0.4 MG SL  tablet Place 1 tablet (0.4 mg total) under the tongue every 5 (five) minutes as  needed for chest pain. 30 tablet 1   pantoprazole  (PROTONIX ) 40 MG tablet Take 1 tablet (40 mg total) by mouth daily. 30 tablet 0   Tiotropium Bromide  Monohydrate (SPIRIVA  RESPIMAT) 1.25 MCG/ACT AERS Inhale 2 each into the lungs daily as needed (Congestion).     TYMLOS 3120 MCG/1.56ML SOPN Inject 80 mcg into the skin at bedtime.     Camphor-Menthol -Methyl Sal 3.06-07-08 % PTCH at bedtime. (Patient not taking: Reported on 10/07/2023)     fluticasone  (FLONASE ) 50 MCG/ACT nasal spray Place 2 sprays into both nostrils daily. (Patient not taking: Reported on 10/07/2023) 16 g 0   ondansetron  (ZOFRAN -ODT) 8 MG disintegrating tablet Take 1 tablet (8 mg total) by mouth every 8 (eight) hours as needed for nausea or vomiting. (Patient not taking: Reported on 10/07/2023) 10 tablet 0   No current facility-administered medications on file prior to visit.    Allergies:   Allergies  Allergen Reactions   Ciprofloxacin Hives   Codeine Hives   Demerol  [Meperidine ] Hives   Hydrogen Peroxide Itching and Other (See Comments)    White blisters   Propoxyphene Other (See Comments)    Unknown Other reaction(s): OTHER   Tramadol Other (See Comments)    "ITCHY ON THE INSIDE"      OBJECTIVE:  Physical Exam  Vitals:   10/07/23 1015  BP: (!) 140/82  Pulse: 84  Weight: 127 lb 6.4 oz (57.8 kg)  Height: 4\' 11"  (1.499 m)   Body mass index is 25.73 kg/m. No results found.   General: well developed, well nourished, very pleasant elderly Caucasian female, seated, in no evident distress Head: head normocephalic and atraumatic.   Neck: supple with no carotid or supraclavicular bruits Cardiovascular: regular rate and rhythm, no murmurs Musculoskeletal: no deformity Skin:  no rash/petichiae, healing incision site right upper chest s/p TCAR, edges well-approximated, no redness or warmth noted    Neurologic Exam Mental Status: Awake and  fully alert.  Fluent speech and language.  Oriented to place and time. Recent and remote memory intact. Attention span, concentration and fund of knowledge appropriate. Mood and affect appropriate.  Cranial Nerves: Extraocular movements full without nystagmus. Visual fields intact OS. Complete OD vision loss. Hearing mildly impaired. Facial sensation intact. Face, tongue, palate moves normally and symmetrically.  Motor: Normal bulk and tone. Normal strength in all tested extremity muscles Sensory.: intact to touch , pinprick , position and vibratory sensation.  Coordination: Rapid alternating movements normal in all extremities. Finger-to-nose and heel-to-shin performed accurately bilaterally. Gait and Station: Arises from chair without difficulty. Stance is normal. Gait demonstrates normal stride length and balance without use of AD.  Reflexes: 1+ and symmetric. Toes downgoing.     NIHSS  0 Modified Rankin  1      ASSESSMENT: Jacqueline Burke is a 88 y.o. year old female with punctate right CR infarct and right CRAO in 09/2023 artery to artery embolic secondary to right ICA stenosis s/p R TCAR. Vascular risk factors include carotid stenosis, HTN, HLD, intracranial stenosis and advanced age.      PLAN:  R CR stroke: R CRAO :  Residual deficit: OD vision loss.  Advised to schedule follow-up visit with ophthalmology over the next couple of months.  Continue working with PT as scheduled, she denies continued balance difficulties.  Continue aspirin  81mg  daily and Plavix  and atorvastatin  (Lipitor ) for secondary stroke prevention managed/prescribed by PCP.  Ongoing duration of DAPT will be determined by [facility].  Discussed secondary stroke prevention measures and  importance of close PCP follow up for aggressive stroke risk factor management including BP goal<130/90, HLD with LDL goal<70 and DM with A1c.<7 .  Stroke labs 09/2023: LDL 57, A1c 5.8 I have gone over the pathophysiology of stroke, warning  signs and symptoms, risk factors and their management in some detail with instructions to go to the closest emergency room for symptoms of concern.  Carotid stenosis: S/p TCAR 09/17/2023 F/u with [facility] as scheduled 5/16 Advised ongoing duration of DAPT will be determined by [facility]. Once completed, will remain on clopidogrel  alone as well as statin   No further recommendations from stroke standpoint and is closely being followed by PCP, cardiology and vascular surgery.  Patient can follow-up in office on an as-needed basis  CC:  [facility] provider: Dr. Janett Medin PCP: Ransom Byers, MD    I spent 50 minutes of face-to-face and non-face-to-face time with patient.  This included previsit chart review including extensive review of hospitalization, lab review, study review, order entry, electronic health record documentation, patient education and discussion regarding above diagnoses and treatment plan and answered all other questions to patient's satisfaction  Johny Nap, [facility]  [facility] [address] [place], [place] [address]  Phone [phone] Fax [phone] Note: This document was prepared with digital dictation and possible smart phrase technology. Any transcriptional errors that result from this process are unintentional.

## 2023-10-07 NOTE — Patient Instructions (Signed)
 Continue working with therapy as scheduled - continue to do exercises at home  Recommend scheduling a follow up visit with your eye doctor over the next couple of months  Continue aspirin  81 mg daily and clopidogrel  75 mg daily  and atorvastatin   for secondary stroke prevention  Follow up with vascular surgery as scheduled, they will determine ongoing duration of both aspirin  and plavix  therapy   Continue to follow up with PCP regarding cholesterol and blood pressure management  Maintain strict control of hypertension with blood pressure goal below 130/90 and cholesterol with LDL cholesterol (bad cholesterol) goal below 70 mg/dL.   Signs of a Stroke? Follow the BEFAST method:  Balance Watch for a sudden loss of balance, trouble with coordination or vertigo Eyes Is there a sudden loss of vision in one or both eyes? Or double vision?  Face: Ask the person to smile. Does one side of the face droop or is it numb?  Arms: Ask the person to raise both arms. Does one arm drift downward? Is there weakness or numbness of a leg? Speech: Ask the person to repeat a simple phrase. Does the speech sound slurred/strange? Is the person confused ? Time: If you observe any of these signs, call 911.       Thank you for coming to see us  at [facility]. I hope we have been able to provide you high quality care today.  You may receive a patient satisfaction survey over the next few weeks. We would appreciate your feedback and comments so that we may continue to improve ourselves and the health of our patients.     Stroke Prevention Some medical conditions and lifestyle choices can lead to a higher risk for a stroke. You can help to prevent a stroke by eating healthy foods and exercising. It also helps to not smoke and to manage any health problems you may have. How can this condition affect me? A stroke is an emergency. It should be treated right away. A stroke can lead to brain damage  or threaten your life. There is a better chance of surviving and getting better after a stroke if you get medical help right away. What can increase my risk? The following medical conditions may increase your risk of a stroke: Diseases of the heart and blood vessels (cardiovascular disease). High blood pressure (hypertension). Diabetes. High cholesterol. Sickle cell disease. Problems with blood clotting. Being very overweight. Sleeping problems (obstructivesleep apnea). Other risk factors include: Being older than age 3. A history of blood clots, stroke, or mini-stroke (TIA). Race, ethnic background, or a family history of stroke. Smoking or using tobacco products. Taking birth control pills, especially if you smoke. Heavy alcohol  and drug use. Not being active. What actions can I take to prevent this? Manage your health conditions High cholesterol. Eat a healthy diet. If this is not enough to manage your cholesterol, you may need to take medicines. Take medicines as told by your doctor. High blood pressure. Try to keep your blood pressure below 130/80. If your blood pressure cannot be managed through a healthy diet and regular exercise, you may need to take medicines. Take medicines as told by your doctor. Ask your doctor if you should check your blood pressure at home. Have your blood pressure checked every year. Diabetes. Eat a healthy diet and get regular exercise. If your blood sugar (glucose) cannot be managed through diet and exercise, you may need to take medicines. Take medicines as told by your  doctor. Talk to your doctor about getting checked for sleeping problems. Signs of a problem can include: Snoring a lot. Feeling very tired. Make sure that you manage any other conditions you have. Nutrition  Follow instructions from your doctor about what to eat or drink. You may be told to: Eat and drink fewer calories each day. Limit how much salt (sodium) you use to 1,500  milligrams (mg) each day. Use only healthy fats for cooking, such as olive oil, canola oil, and sunflower oil. Eat healthy foods. To do this: Choose foods that are high in fiber. These include whole grains, and fresh fruits and vegetables. Eat at least 5 servings of fruits and vegetables a day. Try to fill one-half of your plate with fruits and vegetables at each meal. Choose low-fat (lean) proteins. These include low-fat cuts of meat, chicken without skin, fish, tofu, beans, and nuts. Eat low-fat dairy products. Avoid foods that: Are high in salt. Have saturated fat. Have trans fat. Have cholesterol. Are processed or pre-made. Count how many carbohydrates you eat and drink each day. Lifestyle If you drink alcohol : Limit how much you have to: 0-1 drink a day for women who are not pregnant. 0-2 drinks a day for men. Know how much alcohol  is in your drink. In the [place] one drink equals one 12 oz bottle of beer ( ), one 5 oz glass of wine ( ), or one 1 oz glass of hard liquor (44mL). Do not smoke or use any products that have nicotine or tobacco. If you need help quitting, ask your doctor. Avoid secondhand smoke. Do not use drugs. Activity  Try to stay at a healthy weight. Get at least 30 minutes of exercise on most days, such as: Fast walking. Biking. Swimming. Medicines Take over-the-counter and prescription medicines only as told by your doctor. Avoid taking birth control pills. Talk to your doctor about the risks of taking birth control pills if: You are over 23 years old. You smoke. You get very bad headaches. You have had a blood clot. Where to find more information American Stroke Association: www.strokeassociation.org Get help right away if: You or a loved one has any signs of a stroke. "BE FAST" is an easy way to remember the warning signs: B - Balance. Dizziness, sudden trouble walking, or loss of balance. E - Eyes. Trouble seeing or a change in how you  see. F - Face. Sudden weakness or loss of feeling of the face. The face or eyelid may droop on one side. A - Arms. Weakness or loss of feeling in an arm. This happens all of a sudden and most often on one side of the body. S - Speech. Sudden trouble speaking, slurred speech, or trouble understanding what people say. T - Time. Time to call emergency services. Write down what time symptoms started. You or a loved one has other signs of a stroke, such as: A sudden, very bad headache with no known cause. Feeling like you may vomit (nausea). Vomiting. A seizure. These symptoms may be an emergency. Get help right away. Call your local emergency services (911 in the [place] Do not wait to see if the symptoms will go away. Do not drive yourself to the hospital. Summary You can help to prevent a stroke by eating healthy, exercising, and not smoking. It also helps to manage any health problems you have. Do not smoke or use any products that contain nicotine or tobacco. Get help right away if you or a loved one  has any signs of a stroke. This information is not intended to replace advice given to you by your health care provider. Make sure you discuss any questions you have with your health care provider. Document Revised: 04/21/2022 Document Reviewed: 04/21/2022 Elsevier Patient Education  2024 [employer].

## 2023-10-08 NOTE — Progress Notes (Signed)
 I agree with the above plan

## 2023-10-13 ENCOUNTER — Ambulatory Visit: Admitting: Physical Therapy

## 2023-10-14 ENCOUNTER — Encounter: Payer: Self-pay | Admitting: Physical Therapy

## 2023-10-14 ENCOUNTER — Ambulatory Visit: Admitting: Physical Therapy

## 2023-10-14 VITALS — BP 156/89 | HR 70

## 2023-10-14 DIAGNOSIS — R2681 Unsteadiness on feet: Secondary | ICD-10-CM

## 2023-10-14 DIAGNOSIS — R269 Unspecified abnormalities of gait and mobility: Secondary | ICD-10-CM | POA: Diagnosis not present

## 2023-10-14 DIAGNOSIS — M6281 Muscle weakness (generalized): Secondary | ICD-10-CM

## 2023-10-14 NOTE — Progress Notes (Unsigned)
 Patient ID: Jacqueline Burke, female   DOB: [birth year], 88 y.o.   MRN: [id number]  Reason for Consult: Routine Post Op   Referred by Ransom Byers, *  Subjective:  HPI Jacqueline Burke is a 88 y.o. female who is about 1 month s/p right TCAR for symptomatic stenosis with right retinal artery occlusion.  She reports the incision is healed well.  She denies any new neurologic symptoms.  Specifically she denies any one-sided weakness, numbness, amaurosis or speech issues. She has not had any improvement in the vision with her right eye.  Past Medical History:  Diagnosis Date   Allergic rhinitis    CAD (coronary artery disease), native coronary artery    NSTEMI with 95% RCA s/p DES to RCA 06/2017   Carotid artery occlusion    Complication of anesthesia    makes her confused cursing etc..   Diverticulosis    GERD (gastroesophageal reflux disease)    Heart murmur    Systolic heart murmur with Aortic valve sclerosis by ECHO   History of kidney stones    Hyperlipidemia    Hypertension    Myocardial infarction (HCC)    Osteoarthritis    Erosive OA-MRI of R hand negative for synovitis, only OA-Dr Meredith Stalls   Osteopenia    Pelvic prolapse    PVC's (premature ventricular contractions)    Family History  Problem Relation Age of Onset   Stroke Mother    Diabetes Mother    Heart attack Mother    Osteoporosis Father    Breast cancer Cousin    Past Surgical History:  Procedure Laterality Date   BUNIONECTOMY  08/2006   R foot and hammer roe right second toe   CARDIAC CATHETERIZATION     CARPAL TUNNEL RELEASE  2002   bil hands   CONVERSION TO TOTAL HIP Left 08/08/2022   Procedure: CONVERSION TO TOTAL HIP FROM IM NAIL;  Surgeon: Murleen Arms, MD;  Location: [facility];  Service: Orthopedics;  Laterality: Left;   CORONARY STENT INTERVENTION N/A 06/15/2017   Procedure: CORONARY STENT INTERVENTION;  Surgeon: Avanell Leigh, MD;  Location: [facility];  Service: Cardiovascular;   Laterality: N/A;   CYSTOSCOPY     with laser lithotripsy Dr. Parke Boll 07-20-17    CYSTOSCOPY W/ URETERAL STENT PLACEMENT Left 06/09/2017   Procedure: CYSTOSCOPY WITH RETROGRADE PYELOGRAM/URETERAL STENT PLACEMENT;  Surgeon: Samson Croak, MD;  Location: [facility];  Service: Urology;  Laterality: Left;   CYSTOSCOPY WITH RETROGRADE PYELOGRAM, URETEROSCOPY AND STENT PLACEMENT Left 07/20/2017   Procedure: CYSTOSCOPY WITH RETROGRADE PYELOGRAM, LEFT URETEROSCOPY HOLMIUM LASER LITHO  AND STENT EXCHANGE;  Surgeon: Samson Croak, MD;  Location: [facility];  Service: Urology;  Laterality: Left;   HOLMIUM LASER APPLICATION Left 07/20/2017   Procedure: HOLMIUM LASER APPLICATION;  Surgeon: Samson Croak, MD;  Location: [facility];  Service: Urology;  Laterality: Left;   INTRAMEDULLARY (IM) NAIL INTERTROCHANTERIC Left 08/04/2020   Procedure: INTRAMEDULLARY (IM) NAIL INTERTROCHANTRIC WITH CABLES;  Surgeon: Jasmine Mesi, MD;  Location: [facility];  Service: Orthopedics;  Laterality: Left;   LEFT HEART CATH AND CORONARY ANGIOGRAPHY N/A 06/15/2017   Procedure: LEFT HEART CATH AND CORONARY ANGIOGRAPHY;  Surgeon: Avanell Leigh, MD;  Location: [facility];  Service: Cardiovascular;  Laterality: N/A;   OPEN REDUCTION INTERNAL FIXATION (ORIF) DISTAL RADIAL FRACTURE Left 10/23/2022   Procedure: OPEN REDUCTION INTERNAL FIXATION (ORIF) DISTAL RADIUS FRACTURE;  Surgeon: Osa Blase, MD;  Location: [facility];  Service: Orthopedics;  Laterality: Left;   TOTAL KNEE ARTHROPLASTY Left 08/2007   TOTAL KNEE ARTHROPLASTY Right 03/2010   TRANSCAROTID ARTERY REVASCULARIZATION  Right 09/17/2023   Procedure: TRANSCAROTID ARTERY REVASCULARIZATION (TCAR);  Surgeon: Philipp Brawn, MD;  Location: [facility];  Service: Vascular;  Laterality: Right;   ULTRASOUND GUIDANCE FOR VASCULAR ACCESS Left 09/17/2023   Procedure: ULTRASOUND GUIDANCE, FOR VASCULAR ACCESS;  Surgeon: Philipp Brawn, MD;  Location: [facility];  Service: Vascular;   Laterality: Left;    Short Social History:  Social History   Tobacco Use   Smoking status: Former    Current packs/day: 0.00    Average packs/day: 1 pack/day for 25.0 years (25.0 ttl pk-yrs)    Types: Cigarettes    Start date: 06/02/1968    Quit date: 06/02/1993    Years since quitting: 30.3   Smokeless tobacco: Never  Substance Use Topics   Alcohol  use: No    Allergies  Allergen Reactions   Ciprofloxacin Hives   Codeine Hives   Demerol  [Meperidine ] Hives   Hydrogen Peroxide Itching and Other (See Comments)    White blisters   Propoxyphene Other (See Comments)    Unknown Other reaction(s): OTHER   Tramadol Other (See Comments)    "ITCHY ON THE INSIDE"    Current Outpatient Medications  Medication Sig Dispense Refill   acetaminophen  (TYLENOL ) 325 MG tablet Take 325 mg by mouth at bedtime.     Albuterol  Sulfate (PROAIR  RESPICLICK) 108 (90 Base) MCG/ACT AEPB Inhale 1 puff into the lungs daily as needed (wheezing). 1 each 0   aspirin  EC 81 MG tablet Take 1 tablet (81 mg total) by mouth daily. Swallow whole. 30 tablet 12   atorvastatin  (LIPITOR ) 80 MG tablet TAKE 1 TABLET BY MOUTH ONCE DAILY AT  6  PM 90 tablet 2   cetirizine  (ZYRTEC ) 10 MG chewable tablet Chew 1 tablet (10 mg total) by mouth daily. 30 tablet 0   Cholecalciferol  (VITAMIN D  PO) Take 3,000 Units by mouth daily.      clopidogrel  (PLAVIX ) 75 MG tablet Take 1 tablet by mouth once daily 90 tablet 2   Cyanocobalamin  5000 MCG TBDP Take 5,000 mcg by mouth daily.     docusate sodium  (COLACE) 100 MG capsule Take 1 capsule (100 mg total) by mouth daily. 10 capsule 0   gabapentin  (NEURONTIN ) 300 MG capsule Take 2 capsules (600 mg total) by mouth daily. (Patient taking differently: Take 300 mg by mouth at bedtime.) 60 capsule 0   guaiFENesin  (MUCINEX ) 600 MG 12 hr tablet Take 1 tablet (600 mg total) by mouth 2 (two) times daily. (Patient taking differently: Take 600 mg by mouth 2 (two) times daily as needed for to loosen  phlegm.) 60 tablet 0   metoprolol  tartrate (LOPRESSOR ) 25 MG tablet Take 0.5 tablets (12.5 mg total) by mouth 2 (two) times daily.     Multiple Vitamin (MULTIVITAMIN) capsule Take 1 capsule by mouth daily.     nitroGLYCERIN  (NITROSTAT ) 0.4 MG SL tablet Place 1 tablet (0.4 mg total) under the tongue every 5 (five) minutes as needed for chest pain. 30 tablet 1   pantoprazole  (PROTONIX ) 40 MG tablet Take 1 tablet (40 mg total) by mouth daily. 30 tablet 0   Tiotropium Bromide  Monohydrate (SPIRIVA  RESPIMAT) 1.25 MCG/ACT AERS Inhale 2 each into the lungs daily as needed (Congestion).     TYMLOS 3120 MCG/1.56ML SOPN Inject 80 mcg into the skin at bedtime.     Camphor-Menthol -Methyl  Sal 3.06-07-08 % PTCH at bedtime. (Patient not taking: Reported on 10/07/2023)     fluticasone  (FLONASE ) 50 MCG/ACT nasal spray Place 2 sprays into both nostrils daily. (Patient not taking: Reported on 10/07/2023) 16 g 0   ondansetron  (ZOFRAN -ODT) 8 MG disintegrating tablet Take 1 tablet (8 mg total) by mouth every 8 (eight) hours as needed for nausea or vomiting. (Patient not taking: Reported on 10/07/2023) 10 tablet 0   No current facility-administered medications for this visit.    REVIEW OF SYSTEMS  All other systems reviewed and are negative  Objective:  Objective   Vitals:   10/16/23 0913 10/16/23 0916  BP: (!) 184/83 (!) 182/90  Pulse: 62   Resp: 20   Temp: 98 F (36.7 C)   TempSrc: Temporal   SpO2: 94%   Weight: 128 lb 11.2 oz (58.4 kg)   Height: 4\' 11"  (1.499 m)    Body mass index is 25.99 kg/m.  Physical Exam General: no acute distress Cardiac: hemodynamically stable Pulm: normal work of breathing Neuro: alert, no focal deficit Extremities: no edema, cyanosis or wounds, left neck incision healing well Vascular:   Right: Palpable radial  Left: Palpable radial  Data: Carotid duplex Right Carotid Findings:  +----------+--------+--------+--------+------------------+--------+           PSV cm/sEDV  cm/sStenosisPlaque DescriptionComments  +----------+--------+--------+--------+------------------+--------+  CCA Prox  73      18                                          +----------+--------+--------+--------+------------------+--------+  ICA Distal71      20                                          +----------+--------+--------+--------+------------------+--------+  ECA      148     6                                           +----------+--------+--------+--------+------------------+--------+   +----------+--------+-------+----------------+-------------------+           PSV cm/sEDV cmsDescribe        Arm Pressure (mmHG)  +----------+--------+-------+----------------+-------------------+  [id number]     0      Multiphasic, [id number]                  +----------+--------+-------+----------------+-------------------+   +---------+--------+--+--------+-+----------------------+  VertebralPSV cm/s24EDV cm/s8Antegrade and Atypical  +---------+--------+--+--------+-+----------------------+      Right Stent(s):  +------------------+--------+--------+--------+--------+--------+  Mid CCA to Mid ICAPSV cm/sEDV cm/sStenosisWaveformComments  +------------------+--------+--------+--------+--------+--------+  Prox to Stent     56      14                                +------------------+--------+--------+--------+--------+--------+  Proximal Stent    46      12                                +------------------+--------+--------+--------+--------+--------+  Mid Stent         89      19                                +------------------+--------+--------+--------+--------+--------+  Distal Stent      77      16                                +------------------+--------+--------+--------+--------+--------+  Distal to Stent   81      20                                 +------------------+--------+--------+--------+--------+--------+          Left Carotid Findings:  +----------+--------+--------+--------+------------------+--------+           PSV cm/sEDV cm/sStenosisPlaque DescriptionComments  +----------+--------+--------+--------+------------------+--------+  CCA Prox  78      15                                          +----------+--------+--------+--------+------------------+--------+  CCA Distal80      18                                          +----------+--------+--------+--------+------------------+--------+  ICA Prox  103     22      1-39%   heterogenous                +----------+--------+--------+--------+------------------+--------+  ICA Distal81      21                                          +----------+--------+--------+--------+------------------+--------+  ECA      261     24      >50%    heterogenous                +----------+--------+--------+--------+------------------+--------+   +----------+--------+--------+----------------+-------------------+           PSV cm/sEDV cm/sDescribe        Arm Pressure (mmHG)  +----------+--------+--------+----------------+-------------------+  Subclavian117    0       Multiphasic, [id number]                  +----------+--------+--------+----------------+-------------------+   +---------+--------+--+--------+--+---------+  VertebralPSV cm/s71EDV cm/s11Antegrade  +---------+--------+--+--------+--+---------+        Summary:  Right Carotid: Widely patent mid CCA to mid ICA stent without evidence of                 stenosis.   Left Carotid: Velocities in the left ICA are consistent with a 1-39%  stenosis.   Vertebrals: Left vertebral artery demonstrates antegrade flow. Atypical               antegrade flow in the right vertebral artery.  Subclavians: Normal flow hemodynamics were seen in bilateral subclavian                arteries.      Assessment/Plan:     DESERA DUMMER is a 88 y.o. female with symptomatic carotid stenosis on the right with a retinal artery occlusion, she underwent TCAR on 09/16/2023 and has recovered well. The carotid duplex demonstrates a widely patent stent without any evidence of restenosis. Will plan for follow-up in 6 months with a repeat carotid duplex.  Okay to stop  Plavix  at this time and continue with 81 mg aspirin  and Lipitor  daily  She has significant elevated blood pressures, 170s to 180s even at home.  I instructed her to discuss this with her cardiologist at her next appointment which is scheduled for 10/23/2023.  I explained that any patients who have had a stroke or vascular disease we would like them to be more tightly controlled with blood pressures less than 140 and ideally less than 130.      Philipp Brawn MD [facility]

## 2023-10-14 NOTE — Therapy (Signed)
 OUTPATIENT PHYSICAL THERAPY NEURO TREATMENT / DISCHARGE   Patient Name: Jacqueline Burke MRN: [id number] DOB:[birth year], 88 y.o., female Today's Date: 10/14/2023   PCP: Ransom Byers, MD REFERRING PROVIDER: Armenta Landau, MD  PHYSICAL THERAPY DISCHARGE SUMMARY  Visits from Start of Care: 4  Current functional level related to goals / functional outcomes: Achieved 2/3 active LTG   Remaining deficits: Ongoing falls risk   Education / Equipment: PT recommends use of SPC at all times for patient both in community and in home   Patient agrees to discharge. Patient goals were partially met. Patient is being discharged due to maximized rehab potential.    END OF SESSION:  PT End of Session - 10/14/23 0937     Visit Number 4    Number of Visits 5    Date for PT Re-Evaluation 11/06/23    Authorization Type [employer]    PT Start Time [phone]    PT Stop Time 1003    PT Time Calculation (min) 26 min    Equipment Utilized During Treatment Gait belt    Activity Tolerance Patient tolerated treatment well    Behavior During Therapy WFL for tasks assessed/performed            Past Medical History:  Diagnosis Date   Allergic rhinitis    CAD (coronary artery disease), native coronary artery    NSTEMI with 95% RCA s/p DES to RCA 06/2017   Complication of anesthesia    makes her confused cursing etc..   Diverticulosis    GERD (gastroesophageal reflux disease)    Heart murmur    Systolic heart murmur with Aortic valve sclerosis by ECHO   History of kidney stones    Hyperlipidemia    Hypertension    Myocardial infarction (HCC)    Osteoarthritis    Erosive OA-MRI of R hand negative for synovitis, only OA-Dr Meredith Stalls   Osteopenia    Pelvic prolapse    PVC's (premature ventricular contractions)    Past Surgical History:  Procedure Laterality Date   BUNIONECTOMY  08/2006   R foot and hammer roe right second toe   CARDIAC CATHETERIZATION     CARPAL TUNNEL RELEASE   2002   bil hands   CONVERSION TO TOTAL HIP Left 08/08/2022   Procedure: CONVERSION TO TOTAL HIP FROM IM NAIL;  Surgeon: Murleen Arms, MD;  Location: [facility];  Service: Orthopedics;  Laterality: Left;   CORONARY STENT INTERVENTION N/A 06/15/2017   Procedure: CORONARY STENT INTERVENTION;  Surgeon: Avanell Leigh, MD;  Location: [facility];  Service: Cardiovascular;  Laterality: N/A;   CYSTOSCOPY     with laser lithotripsy Dr. Parke Boll 07-20-17    CYSTOSCOPY W/ URETERAL STENT PLACEMENT Left 06/09/2017   Procedure: CYSTOSCOPY WITH RETROGRADE PYELOGRAM/URETERAL STENT PLACEMENT;  Surgeon: Samson Croak, MD;  Location: [facility];  Service: Urology;  Laterality: Left;   CYSTOSCOPY WITH RETROGRADE PYELOGRAM, URETEROSCOPY AND STENT PLACEMENT Left 07/20/2017   Procedure: CYSTOSCOPY WITH RETROGRADE PYELOGRAM, LEFT URETEROSCOPY HOLMIUM LASER LITHO  AND STENT EXCHANGE;  Surgeon: Samson Croak, MD;  Location: [facility];  Service: Urology;  Laterality: Left;   HOLMIUM LASER APPLICATION Left 07/20/2017   Procedure: HOLMIUM LASER APPLICATION;  Surgeon: Samson Croak, MD;  Location: [facility];  Service: Urology;  Laterality: Left;   INTRAMEDULLARY (IM) NAIL INTERTROCHANTERIC Left 08/04/2020   Procedure: INTRAMEDULLARY (IM) NAIL INTERTROCHANTRIC WITH CABLES;  Surgeon: Jasmine Mesi, MD;  Location: [facility];  Service: Orthopedics;  Laterality: Left;   LEFT HEART CATH AND CORONARY ANGIOGRAPHY N/A 06/15/2017   Procedure: LEFT HEART CATH AND CORONARY ANGIOGRAPHY;  Surgeon: Avanell Leigh, MD;  Location: [facility];  Service: Cardiovascular;  Laterality: N/A;   OPEN REDUCTION INTERNAL FIXATION (ORIF) DISTAL RADIAL FRACTURE Left 10/23/2022   Procedure: OPEN REDUCTION INTERNAL FIXATION (ORIF) DISTAL RADIUS FRACTURE;  Surgeon: Osa Blase, MD;  Location: [facility];  Service: Orthopedics;  Laterality: Left;   TOTAL KNEE ARTHROPLASTY Left 08/2007   TOTAL KNEE ARTHROPLASTY Right 03/2010   TRANSCAROTID  ARTERY REVASCULARIZATION  Right 09/17/2023   Procedure: TRANSCAROTID ARTERY REVASCULARIZATION (TCAR);  Surgeon: Philipp Brawn, MD;  Location: [facility];  Service: Vascular;  Laterality: Right;   ULTRASOUND GUIDANCE FOR VASCULAR ACCESS Left 09/17/2023   Procedure: ULTRASOUND GUIDANCE, FOR VASCULAR ACCESS;  Surgeon: Philipp Brawn, MD;  Location: [facility] OR;  Service: Vascular;  Laterality: Left;   Patient Active Problem List   Diagnosis Date Noted   Acute CVA (cerebrovascular accident) (HCC) 09/15/2023   Symptomatic carotid artery stenosis 09/15/2023   Hypomagnesemia 09/15/2023   CRAO (central retinal artery occlusion), right 09/14/2023   RA (rheumatoid arthritis) (HCC) 09/14/2023   Hyperkalemia 09/14/2023   AKI (acute kidney injury) (HCC) 09/14/2023   Senile osteoporosis 01/16/2023   Osteoarthritis of left hip, unspecified osteoarthritis type 08/08/2022   Nocturnal dyspnea 10/22/2021   COPD (chronic obstructive pulmonary disease) (HCC) 02/01/2021   Normochromic normocytic anemia 08/09/2020   Essential hypertension 08/03/2020   CKD (chronic kidney disease), stage III (HCC) 08/03/2020   Closed intertrochanteric fracture of hip, left, initial encounter (HCC) 08/03/2020   Pulmonary fibrosis (HCC) 08/03/2020   Cough 03/20/2020   CAD (coronary artery disease), native coronary artery 08/25/2017   Hyperlipidemia LDL goal <70 08/25/2017   NSTEMI (non-ST elevated myocardial infarction) (HCC) 06/12/2017   Chronic chest pain    Ureteral calculus 06/09/2017   Edema of extremities 02/16/2014   Sinusitis, chronic 11/11/2013   Dyspnea 11/11/2013   PVC's (premature ventricular contractions) 08/19/2013   Benign hypertensive heart disease without heart failure 08/15/2013    ONSET DATE: 09/15/2023 (referral date)  REFERRING DIAG: I63.9 (ICD-10-CM) - Cerebral infarction, unspecified mechanism (HCC)  THERAPY DIAG:  Abnormality of gait and mobility  Unsteadiness on feet  Muscle weakness  (generalized)  Rationale for Evaluation and Treatment: Rehabilitation  SUBJECTIVE:                                                                                                                                                                                             SUBJECTIVE STATEMENT: Patient reports that she didn't use  her cane at all on her trip despite PT recommendation. She did report feeling a little nervous on sand on beach but was able to go out with the help of her husband without falls. Reports feeling ready for discharge today.   Pt accompanied by: self and husband in lobby - [place]   PERTINENT HISTORY: s/p right carotid stent placement, Central retinal artery occlusion secondary to acute CVA from carotid artery stenosis, pulmonary fibrosis, MI, RA  PAIN:  Are you having pain? No  PRECAUTIONS: Fall and R field visiual loss   RED FLAGS: None   WEIGHT BEARING RESTRICTIONS: No  FALLS: Has patient fallen in last 6 months? No  LIVING ENVIRONMENT: Lives with: lives with their spouse and 100 lb dog Lives in: House/apartment Stairs: Yes: External: 3 steps; on left going up Has following equipment at home: Single point cane, Walker - 2 wheeled, Environmental consultant - 4 wheeled, and Grab bars  PLOF: Independent - fully independent doing everything independently and was driving   PATIENT GOALS: "I don't think that I need physical therapy."   OBJECTIVE:  Note: Objective measures were completed at Evaluation unless otherwise noted.  DIAGNOSTIC FINDINGS:   09/14/2023 MR Head wo Contrast: IMPRESSION: 1. 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 2. Mild chronic small vessel ischemic disease. 3. Moderate left V4 stenosis.  Otherwise negative head MRA.  COGNITION: Overall cognitive status: Within functional limits for tasks assessed         TREATMENT:    Self Care:  Vitals:   10/14/23 0946  BP: (!) 156/89  Pulse: 70     Seated, elevated but WFL for  therapy     Vitals assessed as noted above on RUE, patient reports forgetting to take medications this morning, PT reviews importance of medication compliance and patient verbalizes understanding PT continues to emphasize and recommend use of cane for patient to maximize stability especially with visual changes  PT reviews final HEP with patient and emphasizes continue importance of working on exercises even after discharge   TherAct: Activities Specific Balance Test: 83.1%   [facility] PT Assessment - 10/14/23 0001       Functional Gait  Assessment   Gait assessed  Yes    Gait Level Surface Walks 20 ft in less than 7 sec but greater than 5.5 sec, uses assistive device, slower speed, mild gait deviations, or deviates 6-10 in outside of the 12 in walkway width.   6 seconds   Change in Gait Speed Able to smoothly change walking speed without loss of balance or gait deviation. Deviate no more than 6 in outside of the 12 in walkway width.    Gait with Horizontal Head Turns Performs head turns smoothly with no change in gait. Deviates no more than 6 in outside 12 in walkway width    Gait with Vertical Head Turns Performs head turns with no change in gait. Deviates no more than 6 in outside 12 in walkway width.    Gait and Pivot Turn Pivot turns safely within 3 sec and stops quickly with no loss of balance.    Step Over Obstacle Is able to step over one shoe box (4.5 in total height) without changing gait speed. No evidence of imbalance.    Gait with Narrow Base of Support Ambulates less than 4 steps heel to toe or cannot perform without assistance.    Gait with Eyes Closed Cannot walk 20 ft without assistance, severe gait deviations or imbalance, deviates greater than  15 in outside 12 in walkway width or will not attempt task.   requires assistance from PT to prevent imbalance   Ambulating Backwards Walks 20 ft, uses assistive device, slower speed, mild gait deviations, deviates 6-10 in outside 12 in  walkway width.    Steps Two feet to a stair, must use rail.    Total Score 19    FGA comment: 19/30 = increased risk for falls             Patient likely near functional baseline on FGA given no major change in scores, PT discusses falls risk and again emphasizes compliance with HEP  PATIENT EDUCATION: Education details: Continue HEP + additions + safety with trip Person educated: Patient Education method: Explanation and Handouts Education comprehension: verbalized understanding  HOME EXERCISE PROGRAM: Access Code: [email or web address] URL: [email or web address] Date: 10/06/2023 Prepared by: Camella Cave  Exercises - Semi-Tandem Corner Balance With Eyes Closed  - 1 x daily - 7 x weekly - 3 sets - 30 seconds hold - Foam Balance Narrow stance  - 1 x daily - 7 x weekly - 3 sets - 30 seconds  hold - Romberg Stance on Foam Pad with Head Rotation  - 1 x daily - 7 x weekly - 3 sets - 10 reps - Tandem Walking with Counter Support  - 1 x daily - 7 x weekly - 3 sets - Standing Marching  - 1 x daily - 7 x weekly - 2 sets - 20 reps - Sit to Stand Without Arm Support  - 1 x daily - 4 x weekly - 3 sets - 10 reps  GOALS: Goals reviewed with patient? Yes  LONG TERM GOALS: Target date: 11/06/2023 (STG = LTG due to POC length)  Patient will report demonstrate independence with final HEP in order to maintain current gains and continue to progress after physical therapy discharge.   Baseline: To be provided  Goal status: INITIAL  2.  Patient will improve ABC Score to 67% or greater to indicate a decreased risk for falls and improved self-reported confidence in balance and sense of steadiness.   Baseline: 60% impairment, improved to 83.1% confidence Goal status:MET  3.  to be assessed / LTG written Baseline: 0.93 m/s with SBA Goal status:D/C appropriate pace for age with scanning for safety  4.  Patient will improve FGA to greater than 22/30 to indicate a decreased risk of falls  and improved dynamic stability.  Baseline: 19/30; 19/30 Goal status: NOT MET  ASSESSMENT:  CLINICAL IMPRESSION: Patient is being discharged from skilled physical therapy services due to maximized rehab potential at this time. FGA still indicates increased risk for falls with PT strongly recommending using SPC for safety. Patient confidence in balance significantly improved since start of PT indicating improved compensation with visual changes. Patient independent in final HEP and agreeable to D/C.  OBJECTIVE IMPAIRMENTS: Abnormal gait, decreased balance, decreased ROM, and impaired vision/preception.   ACTIVITY LIMITATIONS: carrying, stairs, transfers, and locomotion level  PARTICIPATION LIMITATIONS: laundry, driving, and community activity  PERSONAL FACTORS: Age and 3+ comorbidities: see above are also affecting patient's functional outcome.   REHAB POTENTIAL: Good  CLINICAL DECISION MAKING: Stable/uncomplicated  EVALUATION COMPLEXITY: Low  PLAN:  PT FREQUENCY: 1x/week  PT DURATION: 4 weeks  PLANNED INTERVENTIONS: 97164- PT Re-evaluation, 97110-Therapeutic exercises, 97530- Therapeutic activity, [id number]- Neuromuscular re-education, 97535- Self Care, [id number]- Manual therapy, and 97116- Gait training  PLAN FOR NEXT SESSION: NA - D/C with updated HEP  Coreen Devoid, PT,  DPT 10/14/2023, 12:09 PM

## 2023-10-16 ENCOUNTER — Ambulatory Visit (HOSPITAL_COMMUNITY)
Admission: RE | Admit: 2023-10-16 | Discharge: 2023-10-16 | Disposition: A | Discharge status: Home / Self Care | Source: Ambulatory Visit | Attending: Vascular Surgery | Admitting: Vascular Surgery

## 2023-10-16 ENCOUNTER — Ambulatory Visit: Attending: Vascular Surgery | Admitting: Vascular Surgery

## 2023-10-16 ENCOUNTER — Encounter: Payer: Self-pay | Admitting: Vascular Surgery

## 2023-10-16 VITALS — BP 182/90 | HR 62 | Temp 98.0°F | Resp 20 | Ht 59.0 in | Wt 128.7 lb

## 2023-10-16 DIAGNOSIS — I6521 Occlusion and stenosis of right carotid artery: Secondary | ICD-10-CM | POA: Diagnosis not present

## 2023-10-16 DIAGNOSIS — I6523 Occlusion and stenosis of bilateral carotid arteries: Secondary | ICD-10-CM | POA: Insufficient documentation

## 2023-10-21 ENCOUNTER — Ambulatory Visit: Admitting: Physical Therapy

## 2023-10-21 ENCOUNTER — Other Ambulatory Visit: Payer: Self-pay | Admitting: *Deleted

## 2023-10-21 DIAGNOSIS — I6523 Occlusion and stenosis of bilateral carotid arteries: Secondary | ICD-10-CM

## 2023-10-23 ENCOUNTER — Encounter: Payer: Self-pay | Admitting: *Deleted

## 2023-10-23 ENCOUNTER — Encounter: Payer: Self-pay | Admitting: Cardiology

## 2023-10-23 ENCOUNTER — Ambulatory Visit: Attending: Cardiology | Admitting: Cardiology

## 2023-10-23 VITALS — BP 160/78 | HR 82 | Ht 59.0 in | Wt 128.8 lb

## 2023-10-23 DIAGNOSIS — I6523 Occlusion and stenosis of bilateral carotid arteries: Secondary | ICD-10-CM

## 2023-10-23 DIAGNOSIS — I639 Cerebral infarction, unspecified: Secondary | ICD-10-CM

## 2023-10-23 DIAGNOSIS — I493 Ventricular premature depolarization: Secondary | ICD-10-CM | POA: Diagnosis not present

## 2023-10-23 DIAGNOSIS — I2510 Atherosclerotic heart disease of native coronary artery without angina pectoris: Secondary | ICD-10-CM

## 2023-10-23 DIAGNOSIS — I10 Essential (primary) hypertension: Secondary | ICD-10-CM

## 2023-10-23 DIAGNOSIS — E785 Hyperlipidemia, unspecified: Secondary | ICD-10-CM | POA: Diagnosis not present

## 2023-10-23 MED ORDER — METOPROLOL TARTRATE 25 MG PO TABS: 25.0000 mg | ORAL_TABLET | Freq: Two times a day (BID) | ORAL | 3 refills | Status: AC

## 2023-10-23 NOTE — Progress Notes (Unsigned)
 Patient enrolled for [employer]/ [employer] to ship a 30 day cardiac event monitor to her address on file.  Letter with instructions mailed to patient.

## 2023-10-23 NOTE — Progress Notes (Signed)
 Cardiology Office Note:    Date:  10/23/2023   ID:  Jacqueline Burke, DOB [birth year], MRN [id number]  PCP:  Ransom Byers, MD  Cardiologist:  Gaylyn Keas, MD    Referring MD: Ransom Byers, *   Chief Complaint  Patient presents with   Coronary Artery Disease   Hypertension   Hyperlipidemia     History of Present Illness:    Jacqueline Burke is a 88 y.o. female with a hx of hypertension, asymptomatic PVCs, ASCAD s/p NSTEMI with peak troponin of 19 in setting of acute respiratory failure and E Coli bacteremia in January 2019. 2D echo with normal LVEF, elevated LVEDP and cath with  95% RCA stenosis s/p PCI of the RCA with DES and normal EF, EF 55-65% s/p DES to RCA.  She saw Leala Prince, PA in Nov 2024 and was complaining of some chest pain.  A stress test was done that showed no ischemia. 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  CT angio of head and neck showed 50% stenosis of the right cervical ICA at the origin.  MRI/MRA showed punctate acute/early subacute infarct of the right corona radiator.  2D echo was normal.  She underwent TCAR of the right carotid artery.    She is here today for followup and is doing well.  She has chronic stable angina that is stable and very sporadic.  It resolves with rest and has never had to take NTG.  She has chronic DOE that is sporadic and unchanged from last OV.  It resolves with rest. She denies any  PND, orthopnea, LE edema, dizziness, palpitations or syncope. She is compliant with her meds and is tolerating meds with no SE.    Past Medical History:  Diagnosis Date   Allergic rhinitis    CAD (coronary artery disease), native coronary artery    NSTEMI with 95% RCA s/p DES to RCA 06/2017   Carotid artery occlusion    Complication of anesthesia    makes her confused cursing etc..   Diverticulosis    GERD (gastroesophageal reflux disease)    Heart murmur     Systolic heart murmur with Aortic valve sclerosis by ECHO   History of kidney stones    Hyperlipidemia    Hypertension    Myocardial infarction (HCC)    Osteoarthritis    Erosive OA-MRI of R hand negative for synovitis, only OA-Dr Meredith Stalls   Osteopenia    Pelvic prolapse    PVC's (premature ventricular contractions)     Past Surgical History:  Procedure Laterality Date   BUNIONECTOMY  08/2006   R foot and hammer roe right second toe   CARDIAC CATHETERIZATION     CARPAL TUNNEL RELEASE  2002   bil hands   CONVERSION TO TOTAL HIP Left 08/08/2022   Procedure: CONVERSION TO TOTAL HIP FROM IM NAIL;  Surgeon: Murleen Arms, MD;  Location: [facility];  Service: Orthopedics;  Laterality: Left;   CORONARY STENT INTERVENTION N/A 06/15/2017   Procedure: CORONARY STENT INTERVENTION;  Surgeon: Avanell Leigh, MD;  Location: [facility];  Service: Cardiovascular;  Laterality: N/A;   CYSTOSCOPY     with laser lithotripsy Dr. Parke Boll 07-20-17    CYSTOSCOPY W/ URETERAL STENT PLACEMENT Left 06/09/2017   Procedure: CYSTOSCOPY WITH RETROGRADE PYELOGRAM/URETERAL STENT PLACEMENT;  Surgeon: Samson Croak, MD;  Location: [facility];  Service: Urology;  Laterality: Left;   CYSTOSCOPY WITH RETROGRADE PYELOGRAM, URETEROSCOPY AND STENT PLACEMENT Left 07/20/2017   Procedure: CYSTOSCOPY WITH RETROGRADE PYELOGRAM, LEFT URETEROSCOPY HOLMIUM LASER LITHO  AND STENT EXCHANGE;  Surgeon: Samson Croak, MD;  Location: [facility];  Service: Urology;  Laterality: Left;   HOLMIUM LASER APPLICATION Left 07/20/2017   Procedure: HOLMIUM LASER APPLICATION;  Surgeon: Samson Croak, MD;  Location: [facility];  Service: Urology;  Laterality: Left;   INTRAMEDULLARY (IM) NAIL INTERTROCHANTERIC Left 08/04/2020   Procedure: INTRAMEDULLARY (IM) NAIL INTERTROCHANTRIC WITH CABLES;  Surgeon: Jasmine Mesi, MD;  Location: [facility];  Service: Orthopedics;  Laterality: Left;   LEFT HEART CATH AND CORONARY ANGIOGRAPHY N/A 06/15/2017    Procedure: LEFT HEART CATH AND CORONARY ANGIOGRAPHY;  Surgeon: Avanell Leigh, MD;  Location: [facility];  Service: Cardiovascular;  Laterality: N/A;   OPEN REDUCTION INTERNAL FIXATION (ORIF) DISTAL RADIAL FRACTURE Left 10/23/2022   Procedure: OPEN REDUCTION INTERNAL FIXATION (ORIF) DISTAL RADIUS FRACTURE;  Surgeon: Osa Blase, MD;  Location: [facility];  Service: Orthopedics;  Laterality: Left;   TOTAL KNEE ARTHROPLASTY Left 08/2007   TOTAL KNEE ARTHROPLASTY Right 03/2010   TRANSCAROTID ARTERY REVASCULARIZATION  Right 09/17/2023   Procedure: TRANSCAROTID ARTERY REVASCULARIZATION (TCAR);  Surgeon: Philipp Brawn, MD;  [facility];  Service: Vascular;  Laterality: Right;   ULTRASOUND GUIDANCE FOR VASCULAR ACCESS Left 09/17/2023   Procedure: ULTRASOUND GUIDANCE, FOR VASCULAR ACCESS;  Surgeon: Philipp Brawn, MD;  Location: [facility] OR;  Service: Vascular;  Laterality: Left;    Current Medications: Current Meds  Medication Sig   acetaminophen  (TYLENOL ) 325 MG tablet Take 325 mg by mouth at bedtime.   aspirin  EC 81 MG tablet Take 1 tablet (81 mg total) by mouth daily. Swallow whole.   atorvastatin  (LIPITOR ) 80 MG tablet TAKE 1 TABLET BY MOUTH ONCE DAILY AT  6  PM   Camphor-Menthol -Methyl Sal 3.06-07-08 % PTCH at bedtime.   cetirizine  (ZYRTEC ) 10 MG chewable tablet Chew 1 tablet (10 mg total) by mouth daily.   Cholecalciferol  (VITAMIN D  PO) Take 3,000 Units by mouth daily.    clopidogrel  (PLAVIX ) 75 MG tablet Take 1 tablet by mouth once daily   Cyanocobalamin  5000 MCG TBDP Take 5,000 mcg by mouth daily.   denosumab  (PROLIA ) 60 MG/ML SOSY injection as directed Subcutaneous   docusate sodium  (COLACE) 100 MG capsule Take 1 capsule (100 mg total) by mouth daily.   fluticasone  (FLONASE ) 50 MCG/ACT nasal spray Place 2 sprays into both nostrils daily.   gabapentin  (NEURONTIN ) 300 MG capsule Take 2 capsules (600 mg total) by mouth daily. (Patient taking differently: Take 300 mg by mouth at  bedtime.)   guaiFENesin  (MUCINEX ) 600 MG 12 hr tablet Take 1 tablet (600 mg total) by mouth 2 (two) times daily. (Patient taking differently: Take 600 mg by mouth 2 (two) times daily as needed for to loosen phlegm.)   Menaquinone-7 (VITAMIN K2) 100 MCG CAPS 1 capsule.   metoprolol  tartrate (LOPRESSOR ) 25 MG tablet Take 0.5 tablets (12.5 mg total) by mouth 2 (two) times daily.   Multiple Vitamin (MULTIVITAMIN) capsule Take 1 capsule by mouth daily.   ondansetron  (ZOFRAN -ODT) 8 MG disintegrating tablet Take 1 tablet (8 mg total) by mouth every 8 (eight) hours as needed for nausea or vomiting.   pantoprazole  (PROTONIX ) 40 MG tablet Take 1 tablet (40 mg total) by mouth daily.   Tiotropium Bromide  Monohydrate (SPIRIVA  RESPIMAT) 1.25 MCG/ACT AERS Inhale 2 each into the lungs daily  as needed (Congestion).   TYMLOS 3120 MCG/1.56ML SOPN Inject 80 mcg into the skin at bedtime.     Allergies:   Ciprofloxacin, Codeine, Demerol  [meperidine ], Hydrogen peroxide, Propoxyphene, and Tramadol   Social History   Socioeconomic History   Marital status: Married    Spouse name: Not on file   Number of children: Not on file   Years of education: Not on file   Highest education level: Not on file  Occupational History   Occupation: Retired  Tobacco Use   Smoking status: Former    Current packs/day: 0.00    Average packs/day: 1 pack/day for 25.0 years (25.0 ttl pk-yrs)    Types: Cigarettes    Start date: 06/02/1968    Quit date: 06/02/1993    Years since quitting: 30.4   Smokeless tobacco: Never  Vaping Use   Vaping status: Never Used  Substance and Sexual Activity   Alcohol  use: No   Drug use: No   Sexual activity: Not Currently  Other Topics Concern   Not on file  Social History Narrative   Not on file   Social Drivers of Health   Financial Resource Strain: Not on file  Food Insecurity: No Food Insecurity (09/15/2023)   Hunger Vital Sign    Worried About Running Out of Food in the Last Year: Never  true    Ran Out of Food in the Last Year: Never true  Transportation Needs: No Transportation Needs (09/15/2023)   PRAPARE - Administrator, Civil Service (Medical): No    Lack of Transportation (Non-Medical): No  Physical Activity: Inactive (10/29/2017)   Exercise Vital Sign    Days of Exercise per Week: 0 days    Minutes of Exercise per Session: 0 min  Stress: No Stress Concern Present (10/29/2017)   [employer] of Occupational Health - Occupational Stress Questionnaire    Feeling of Stress : Only a little  Social Connections: Moderately Integrated (09/15/2023)   Social Connection and Isolation Panel [NHANES]    Frequency of Communication with Friends and Family: More than three times a week    Frequency of Social Gatherings with Friends and Family: Once a week    Attends Religious Services: More than 4 times per year    Active Member of [employer] or Organizations: No    Attends Engineer, structural: Never    Marital Status: Married     Family History: The patient's family history includes Breast cancer in her cousin; Diabetes in her mother; Heart attack in her mother; Osteoporosis in her father; Stroke in her mother.  ROS:   Please see the history of present illness.    ROS  All other systems reviewed and negative.   EKGs/Labs/Other Studies Reviewed:    The following studies were reviewed today:  Recent Labs: 09/14/2023: ALT 15 09/16/2023: Magnesium  2.2 09/18/2023: BUN 9; Creatinine, Ser 1.06; Hemoglobin 9.3; Platelets 204; Potassium 4.1; Sodium 141   Recent Lipid Panel    Component Value Date/Time   CHOL 130 09/15/2023 0641   TRIG 110 09/15/2023 0641   HDL 51 09/15/2023 0641   CHOLHDL 2.5 09/15/2023 0641   VLDL 22 09/15/2023 0641   LDLCALC 57 09/15/2023 0641    Physical Exam:    VS:  BP (!) 160/78 (BP Location: Left Arm, Patient Position: Sitting, Cuff Size: Normal)   Pulse 82   Ht 4\' 11"  (1.499 m)   Wt 128 lb 12.8 oz (58.4 kg)   SpO2 93%  BMI 26.01 kg/m     Wt Readings from Last 3 Encounters:  10/23/23 128 lb 12.8 oz (58.4 kg)  10/16/23 128 lb 11.2 oz (58.4 kg)  10/07/23 127 lb 6.4 oz (57.8 kg)    GEN: Well nourished, well developed in no acute distress HEENT: Normal NECK: No JVD;right carotid bruit LYMPHATICS: No lymphadenopathy CARDIAC:RRR, no murmurs, rubs, gallops RESPIRATORY:  Clear to auscultation without rales, wheezing or rhonchi  ABDOMEN: Soft, non-tender, non-distended MUSCULOSKELETAL:  No edema; No deformity  SKIN: Warm and dry NEUROLOGIC:  Alert and oriented x 3 PSYCHIATRIC:  Normal affect  ASSESSMENT:    1. Coronary artery disease involving native coronary artery of native heart without angina pectoris   2. Essential hypertension   3. Hyperlipidemia LDL goal <70   4. PVC's (premature ventricular contractions)   5. Bilateral carotid artery stenosis     PLAN:    In order of problems listed above:  1.  ASCAD -S/p NSTEMI with cath showing  95% RCA stenosis s/p PCI of the RCA with DES -She has not had any anginal symptoms since I saw her last - Continue aspirin  81 mg daily, atorvastatin  80 mg daily, Plavix  75 mg daily, Lopressor  with as needed refills  2.  HTN -BP elevated on exam today>>at home it has been running in the 170-180's systolic -Increase Lopressor  to 25 mg twice daily -I have asked her to check her blood pressures twice daily for a week and send them to me  3.  HLD -LDL goal < 70 -I have personally reviewed and interpreted outside labs performed by patient's PCP which showed LDL 57, HDL 51, ALT 9 on 09/15/2023 and 09/24/2023 -Continue atorvastatin  80 mg daily with as needed refills  4.  PVCs - No further palpitations - Continue beta blocker  5.  Carotid artery stenosis Acute CVA/retinal artery occlusion - Dopplers 10/16/2023 showed patent right ICA and 1 to 39% left ICA with atypical antegrade flow in the right vertebral artery -CT of head and neck after admission for acute  central retinal artery occlusion showed 50% stenosis of the right cervical ICA at the origin.  MRI/MRA showed punctate acute/early subacute infarct of the right corona radiator. -s/p TCAR of the right carotid artery  followed by Vascular -continue ASA and Plavix  -I am going to get a 30 day heart monitor to rule out silent afib   Medication Adjustments/Labs and Tests Ordered: Current medicines are reviewed at length with the patient today.  Concerns regarding medicines are outlined above.  No orders of the defined types were placed in this encounter.  No orders of the defined types were placed in this encounter.   Signed, Gaylyn Keas, MD  10/23/2023 9:11 AM    [facility]

## 2023-10-23 NOTE — Patient Instructions (Addendum)
 Medication Instructions:  Please INCREASE your lopressor  dose to 25 mg twice a day.   *If you need a refill on your cardiac medications before your next appointment, please call your pharmacy*  Lab Work: None.  If you have labs (blood work) drawn today and your tests are completely normal, you will receive your results only by: MyChart Message (if you have MyChart) OR A paper copy in the mail If you have any lab test that is abnormal or we need to change your treatment, we will call you to review the results.  Testing/Procedures: Your physician has recommended that you wear an event monitor for 30 days. Event monitors are medical devices that record the heart's electrical activity. Doctors most often us  these monitors to diagnose arrhythmias. Arrhythmias are problems with the speed or rhythm of the heartbeat. The monitor is a small, portable device. You can wear one while you do your normal daily activities.   [employer] CARDIAC EVENT MONITOR INSTRUCTIONS Your physician has requested you wear a cardiac event monitor for _30_ days. [employer] may call or text to confirm a shipping address. The monitor will be sent to a land address via UPS. [employer] will not ship a monitor to a PO BOX. It typically takes 3-5 days to receive your monitor after it has been enrolled. [employer] will assist with USPS tracking if your package is delayed. The telephone number for [employer] is   [phone]. Once you have received your monitor, please review the enclosed instructions. Instruction tutorials can also be viewed under help and settings on the enclosed cell phone. Your monitor has already been registered assigning a specific monitor serial # to you.  Billing and Self Pay Discount Information [employer] has been provided the insurance information we had on file for you.  If your insurance has been updated, please call [employer] at [phone] to provide them with your updated insurance information.    [employer] offers a discounted Self Pay option for patients who have insurance that does not cover their cardiac event monitor or patients without insurance.  The discounted cost of a Self Pay Cardiac Event Monitor would be $225.00 , if the patient contacts [employer] at [phone] within 7 days of applying the monitor to make payment arrangements.  If the patient does not contact [employer] within 7 days of applying the monitor, the cost of the cardiac event monitor will be $350.00.  Applying the monitor Remove cell phone from case and turn it on. The cell phone works as IT consultant and needs to be always within 10 feet of you. The cell phone will need to be charged daily. We recommend you plug the cell phone into the enclosed charger at your bedside table every night. Monitor batteries: You will receive two monitor batteries labelled #1 and #2. These are your recorders. Plug battery #2 onto the second connection on the enclosed charger. Always keep one battery on the charger. This will keep the monitor battery deactivated. It will also keep it fully charged for when you need to switch your monitor batteries. A small light will blink on the battery emblem when it is charging. The light on the battery emblem will remain on when the battery is fully charged. Open package of a Monitor strip. Insert battery #1 into black hood on strip and gently squeeze monitor battery onto connection as indicated in instruction booklet. Set aside while preparing skin. Choose the location for your strip, vertical or horizontal, as indicated in the instruction booklet. Shave to remove  all hair from location. There cannot be any lotions, oils, powders, or colognes on skin where monitor is to be applied. Wipe skin clean with enclosed Saline wipe. Dry skin completely. Peel paper labelled #1 off the back of the Monitor strip exposing the adhesive. Place the monitor on the chest in the vertical or horizontal position shown in  the instruction booklet. One arrow on the monitor strip must be pointing upward. Carefully remove paper labelled #2, attaching remainder of strip to your skin. Try not to create any folds or wrinkles in the strip as you apply it. Firmly press and release the circle in the center of the monitor battery. You will hear a small beep. This is turning the monitor battery on. The heart emblem on the monitor battery will light up every 5 seconds if the monitor battery is turned on and connected to the patient securely. Do not push and hold the circle down as this turns the monitor battery off. The cell phone will locate the monitor battery. A screen will appear on the cell phone checking the connection of your monitor strip. This may read poor connection initially but change to good connection within the next minute. Once your monitor accepts the connection you will hear a series of 3 beeps followed by a climbing crescendo of beeps. A screen will appear on the cell phone showing the two monitor strip placement options. Touch the picture that demonstrates where you applied the monitor strip. Your monitor strip and battery are waterproof. You will be able to shower, bathe, or swim with the monitor on. Please do not submerge deeper than 3 feet underwater. We recommend removing the monitor if you are swimming in a lake, river, or ocean. Your monitor battery will need to be switched to a fully charged monitor battery approximately once a week. The cell phone will alert you of an action which needs to be taken.  On the cell phone, tap for details to reveal connection status, monitor battery status, and cell phone battery status. A green dot will indicate your monitor is in good status, however, a red dot will indicate something that needs your attention. To record a symptom, click the circle on the monitor battery. In 30-60 seconds, a list of symptoms will appear on the cell phone. Select your symptoms and tap save. Your  monitor will record a sustained or significant arrhythmia regardless of you clicking the button. Some patients do not feel the heart rhythm irregularities. [employer] will notify us  of any serious or critical events. Refer to instruction booklet for instructions on switching batteries, changing strips, the Do not disturb or Pause features, or any additional questions. Call [employer] at [phone], to confirm your monitor is transmitting and record your baseline. They will answer any questions you may have regarding the monitor instructions at that time. Returning the monitor to [employer] Place all equipment back into the blue box. Peel off strip of paper to expose adhesive and close box securely. There is a prepaid UPS shipping label on this box. Drop in a UPS drop box, or at a UPS facility like [employer]. You may also contact [employer] to arrange UPS to pick up monitor package at your home.   Follow-Up: At [facility], you and your health needs are our priority.  As part of our continuing mission to provide you with exceptional heart care, our providers are all part of one team.  This team includes your primary Cardiologist (physician) and Advanced Practice Providers or  APPs (Physician Assistants and Nurse Practitioners) who all work together to provide you with the care you need, when you need it.  Your next appointment:   1 year(s)  Provider:   Gaylyn Keas, MD    We recommend signing up for the patient portal called "MyChart".  Sign up information is provided on this After Visit Summary.  MyChart is used to connect with patients for Virtual Visits (Telemedicine).  Patients are able to view lab/test results, encounter notes, upcoming appointments, etc.  Non-urgent messages can be sent to your provider as well.   To learn more about what you can do with MyChart, go to [email or web address].   Other Instructions Please check your blood pressure twice a day for one week and send  us  the results over Mychart, in the mail, or by dropping off your blood pressure log at our front desk.

## 2023-10-23 NOTE — Addendum Note (Signed)
 Addended by: Cherylyn Cos on: 10/23/2023 09:32 AM   Modules accepted: Orders

## 2023-11-02 ENCOUNTER — Telehealth: Payer: Self-pay | Admitting: Cardiology

## 2023-11-02 DIAGNOSIS — I6523 Occlusion and stenosis of bilateral carotid arteries: Secondary | ICD-10-CM

## 2023-11-02 DIAGNOSIS — I2510 Atherosclerotic heart disease of native coronary artery without angina pectoris: Secondary | ICD-10-CM

## 2023-11-02 DIAGNOSIS — I10 Essential (primary) hypertension: Secondary | ICD-10-CM

## 2023-11-02 DIAGNOSIS — Z01810 Encounter for preprocedural cardiovascular examination: Secondary | ICD-10-CM

## 2023-11-02 DIAGNOSIS — I639 Cerebral infarction, unspecified: Secondary | ICD-10-CM

## 2023-11-02 DIAGNOSIS — I214 Non-ST elevation (NSTEMI) myocardial infarction: Secondary | ICD-10-CM

## 2023-11-02 DIAGNOSIS — I493 Ventricular premature depolarization: Secondary | ICD-10-CM

## 2023-11-02 NOTE — Telephone Encounter (Signed)
 Patient called again to follow-up on getting assistance to have heart monitor placed.

## 2023-11-02 NOTE — Telephone Encounter (Signed)
 Per Dr. Micael Adas, BP still too high - add Losartan 25mg  daily and repeat BP twice daily for a week and call with results. BMET in 1 week   Left message for patient to call back.

## 2023-11-02 NOTE — Telephone Encounter (Signed)
 Pt calling to report BP readings for a week   5/23: 161/82                     193/93 hr77  5/24: 141/65 hr74            189/85 hr62  5/25: 184/82 hr55          131/84 hr68   5/26: 171/77 hr79           144/76 hr 54  5/27: 153/72 hr64           157/82 hr 58   5/28: did not take  5/29: 127/74 hr84           134/76 hr80   5/30:  146/76 hr 80           138/93 hr56   5/31: 167/78 hr56           163/70 hr60   6/1: 126/70 hr83          174/83 hr67

## 2023-11-02 NOTE — Telephone Encounter (Signed)
 Pt called in stating she received heart monitor in the mail and she would like to set up a visit to get it put on. Please advise.

## 2023-11-04 DIAGNOSIS — I2510 Atherosclerotic heart disease of native coronary artery without angina pectoris: Secondary | ICD-10-CM | POA: Diagnosis not present

## 2023-11-04 DIAGNOSIS — R413 Other amnesia: Secondary | ICD-10-CM | POA: Diagnosis not present

## 2023-11-04 DIAGNOSIS — Z0000 Encounter for general adult medical examination without abnormal findings: Secondary | ICD-10-CM | POA: Diagnosis not present

## 2023-11-04 DIAGNOSIS — E559 Vitamin D deficiency, unspecified: Secondary | ICD-10-CM | POA: Diagnosis not present

## 2023-11-04 DIAGNOSIS — J8410 Pulmonary fibrosis, unspecified: Secondary | ICD-10-CM | POA: Diagnosis not present

## 2023-11-04 DIAGNOSIS — M810 Age-related osteoporosis without current pathological fracture: Secondary | ICD-10-CM | POA: Diagnosis not present

## 2023-11-04 DIAGNOSIS — N183 Chronic kidney disease, stage 3 unspecified: Secondary | ICD-10-CM | POA: Diagnosis not present

## 2023-11-04 DIAGNOSIS — E7800 Pure hypercholesterolemia, unspecified: Secondary | ICD-10-CM | POA: Diagnosis not present

## 2023-11-04 DIAGNOSIS — E538 Deficiency of other specified B group vitamins: Secondary | ICD-10-CM | POA: Diagnosis not present

## 2023-11-04 DIAGNOSIS — I10 Essential (primary) hypertension: Secondary | ICD-10-CM | POA: Diagnosis not present

## 2023-11-04 LAB — LAB REPORT - SCANNED: EGFR: 46

## 2023-11-05 NOTE — Telephone Encounter (Signed)
 Returned call to patient and offered her an appointment. She had an appt with her primary care yesterday and they helped get her set up. I advised her to call us  with any issues or she could reach out to the monitor company. She thanked me for the call

## 2023-11-10 NOTE — Telephone Encounter (Signed)
 Attempted to call patient on both phone #'s listed in Epic, neither has a VM that is set up. [facility] message sent. Third attempt-Letter sent.

## 2023-11-17 NOTE — Telephone Encounter (Signed)
 Returned call to troubleshoot issues with monitor.  Patient had changed monitor batteries yesterday, but did not push the button to start the new monitor.  Issue resolve and monitor recording.

## 2023-11-17 NOTE — Telephone Encounter (Signed)
 Pt called in stating her heart monitor is red and telling her it is no longer connected. Please advise.

## 2023-11-19 MED ORDER — LOSARTAN POTASSIUM 25 MG PO TABS: 25.0000 mg | ORAL_TABLET | Freq: Every day | ORAL | 3 refills | Status: AC

## 2023-11-19 NOTE — Addendum Note (Signed)
 Addended by: Alanna Alley on: 11/19/2023 03:27 PM   Modules accepted: Orders

## 2023-11-19 NOTE — Telephone Encounter (Signed)
 Pt calling stating that she rec'vd letter to call office regarding her bp reading and that she has not communicated with anyone yet due to her phone not working properly. Requesting cb

## 2023-11-19 NOTE — Telephone Encounter (Signed)
 Pt advised Dr Charl Concha recommendations and will start the Losartan tonight, have labs in one week, and keep a new BP log and let us  know how she is doing.

## 2023-11-27 ENCOUNTER — Telehealth: Payer: Self-pay | Admitting: Cardiology

## 2023-11-27 NOTE — Telephone Encounter (Signed)
 Pt dropped off blood pressure recordings.   Location: Dr. Shlomo mailbox

## 2023-12-02 NOTE — Telephone Encounter (Signed)
 Patient submitted the following blood pressure readings:  11/20/23: 149/78 HR 63  11/21/23: 129/74 HR 52  11/22/23: 125/97 HR 59  11/23/23: 137/69 HR 57  11/24/23: 136/71 HR 62  11/25/23: 116/64 HR 59  11/26/23: 143/69 HR 55

## 2023-12-07 NOTE — Telephone Encounter (Signed)
 Call to patient to advise that Dr. Santo covering for Dr. Shlomo, reviewed bp readings and recommends no changes to medications/therapies at this time.

## 2023-12-11 ENCOUNTER — Ambulatory Visit: Attending: Cardiology

## 2023-12-11 DIAGNOSIS — I639 Cerebral infarction, unspecified: Secondary | ICD-10-CM

## 2023-12-11 DIAGNOSIS — I493 Ventricular premature depolarization: Secondary | ICD-10-CM

## 2023-12-11 DIAGNOSIS — I6523 Occlusion and stenosis of bilateral carotid arteries: Secondary | ICD-10-CM

## 2023-12-13 ENCOUNTER — Ambulatory Visit: Payer: Self-pay | Admitting: Cardiology

## 2023-12-13 DIAGNOSIS — I639 Cerebral infarction, unspecified: Secondary | ICD-10-CM | POA: Diagnosis not present

## 2023-12-13 DIAGNOSIS — I493 Ventricular premature depolarization: Secondary | ICD-10-CM | POA: Diagnosis not present

## 2023-12-13 DIAGNOSIS — I6523 Occlusion and stenosis of bilateral carotid arteries: Secondary | ICD-10-CM

## 2023-12-29 DIAGNOSIS — M25552 Pain in left hip: Secondary | ICD-10-CM | POA: Diagnosis not present

## 2024-01-11 DIAGNOSIS — S336XXD Sprain of sacroiliac joint, subsequent encounter: Secondary | ICD-10-CM | POA: Diagnosis not present

## 2024-01-11 DIAGNOSIS — M1612 Unilateral primary osteoarthritis, left hip: Secondary | ICD-10-CM | POA: Diagnosis not present

## 2024-01-21 DIAGNOSIS — M1612 Unilateral primary osteoarthritis, left hip: Secondary | ICD-10-CM | POA: Diagnosis not present

## 2024-01-21 DIAGNOSIS — S336XXD Sprain of sacroiliac joint, subsequent encounter: Secondary | ICD-10-CM | POA: Diagnosis not present

## 2024-01-21 DIAGNOSIS — M810 Age-related osteoporosis without current pathological fracture: Secondary | ICD-10-CM | POA: Diagnosis not present

## 2024-02-03 DIAGNOSIS — I10 Essential (primary) hypertension: Secondary | ICD-10-CM | POA: Diagnosis not present

## 2024-02-03 DIAGNOSIS — R630 Anorexia: Secondary | ICD-10-CM | POA: Diagnosis not present

## 2024-02-03 DIAGNOSIS — E7800 Pure hypercholesterolemia, unspecified: Secondary | ICD-10-CM | POA: Diagnosis not present

## 2024-02-03 DIAGNOSIS — S336XXD Sprain of sacroiliac joint, subsequent encounter: Secondary | ICD-10-CM | POA: Diagnosis not present

## 2024-02-03 DIAGNOSIS — N183 Chronic kidney disease, stage 3 unspecified: Secondary | ICD-10-CM | POA: Diagnosis not present

## 2024-02-03 DIAGNOSIS — M1612 Unilateral primary osteoarthritis, left hip: Secondary | ICD-10-CM | POA: Diagnosis not present

## 2024-02-03 DIAGNOSIS — J8410 Pulmonary fibrosis, unspecified: Secondary | ICD-10-CM | POA: Diagnosis not present

## 2024-02-03 DIAGNOSIS — Z23 Encounter for immunization: Secondary | ICD-10-CM | POA: Diagnosis not present

## 2024-02-04 ENCOUNTER — Telehealth: Payer: Self-pay | Admitting: Pharmacist

## 2024-02-04 NOTE — Progress Notes (Addendum)
   02/04/2024  Patient ID: Jacqueline Burke, female   DOB: [birth year], 88 y.o.   MRN: [id number]  Referral received from Dr. Chrystal regarding a full medication review and BP check with the patient. There is confusion surrounding her Losartan .  Called and spoke with the patient on the phone today. A comprehensive medication review was completed with medication list updated in both eCW and EPIC per patient request.   The Losartan  was tucked away with her OTC supplements in the cabinet. Due to this, she has not been taking it lately. She will restart it today.  Regarding BP, it was 153/84 with HR 65. She did not take her medications yet for the day. Yesterday without medications, it was 152/72 in-office.   Patient agreeable for me to call again on Monday at 10:30 AM for a BP check after ensuring she takes her medications after breakfast that morning, with at least 1 hr between.    Update from 9/8: - Patient's BP is 112/59 and HR 57 - Advised to pick-up Losartan  when it is due again on 9/19- confirmed understanding   Aloysius Lewis, PharmD [facility]  Phone Number: [phone]

## 2024-02-09 DIAGNOSIS — M1612 Unilateral primary osteoarthritis, left hip: Secondary | ICD-10-CM | POA: Diagnosis not present

## 2024-02-09 DIAGNOSIS — M810 Age-related osteoporosis without current pathological fracture: Secondary | ICD-10-CM | POA: Diagnosis not present

## 2024-02-09 DIAGNOSIS — R5383 Other fatigue: Secondary | ICD-10-CM | POA: Diagnosis not present

## 2024-02-12 ENCOUNTER — Telehealth: Payer: Self-pay

## 2024-02-12 ENCOUNTER — Other Ambulatory Visit (HOSPITAL_COMMUNITY): Payer: Self-pay | Admitting: Sports Medicine

## 2024-02-12 NOTE — Telephone Encounter (Signed)
 [facility] does not have [employer] form

## 2024-02-15 ENCOUNTER — Telehealth: Payer: Self-pay

## 2024-02-15 NOTE — Telephone Encounter (Signed)
 Auth Submission: APPROVED Site of care: Site of care: [facility] Payer: [employer] Medication & CPT/J Code(s) submitted: Reclast  (Zolendronic acid) [id number] Diagnosis Code:  Route of submission (phone, fax, portal): fax Phone # Fax # [phone]  Auth type: Buy/Bill PB Units/visits requested: 5mg  x 1 dose Reference number: [id number] Approval from: 02/15/24 to 02/14/25

## 2024-02-16 NOTE — Telephone Encounter (Signed)
 Reclast  is approved, see other note.

## 2024-02-17 DIAGNOSIS — S336XXD Sprain of sacroiliac joint, subsequent encounter: Secondary | ICD-10-CM | POA: Diagnosis not present

## 2024-02-17 DIAGNOSIS — M1612 Unilateral primary osteoarthritis, left hip: Secondary | ICD-10-CM | POA: Diagnosis not present

## 2024-02-26 ENCOUNTER — Ambulatory Visit (INDEPENDENT_AMBULATORY_CARE_PROVIDER_SITE_OTHER)

## 2024-02-26 VITALS — BP 140/68 | HR 53 | Temp 98.1°F | Resp 20 | Ht 59.0 in | Wt 130.8 lb

## 2024-02-26 DIAGNOSIS — M810 Age-related osteoporosis without current pathological fracture: Secondary | ICD-10-CM

## 2024-02-26 MED ORDER — DIPHENHYDRAMINE HCL 25 MG PO CAPS
25.0000 mg | ORAL_CAPSULE | Freq: Once | ORAL | Status: AC
  Administered 2024-02-26: 25 mg via ORAL
  Filled 2024-02-26: qty 1

## 2024-02-26 MED ORDER — ZOLEDRONIC ACID 5 MG/100ML IV SOLN
5.0000 mg | Freq: Once | INTRAVENOUS | Status: AC
  Administered 2024-02-26: 5 mg via INTRAVENOUS
  Filled 2024-02-26: qty 100

## 2024-02-26 MED ORDER — SODIUM CHLORIDE 0.9 % IV SOLN: INTRAVENOUS | Status: DC

## 2024-02-26 MED ORDER — ACETAMINOPHEN 325 MG PO TABS: 650.0000 mg | ORAL_TABLET | Freq: Once | ORAL | Status: DC

## 2024-03-01 DIAGNOSIS — E663 Overweight: Secondary | ICD-10-CM | POA: Diagnosis not present

## 2024-03-01 DIAGNOSIS — Z6826 Body mass index (BMI) 26.0-26.9, adult: Secondary | ICD-10-CM | POA: Diagnosis not present

## 2024-03-01 DIAGNOSIS — Z008 Encounter for other general examination: Secondary | ICD-10-CM | POA: Diagnosis not present

## 2024-03-01 DIAGNOSIS — I6930 Unspecified sequelae of cerebral infarction: Secondary | ICD-10-CM | POA: Diagnosis not present

## 2024-04-21 NOTE — Progress Notes (Signed)
 Patient ID: Jacqueline Burke, female   DOB: Jun 07, 1935, 88 y.o.   MRN: [id number]  Reason for Consult: Follow-up   Referred by Jacqueline Burke, *  Subjective:     HPI Jacqueline Burke is a 88 y.o. female who underwent right TCAR for symptomatic stenosis with a right retinal artery occlusion on 10/16/2023.  She has had minimal improvement in her right eye although she denies any new neurologic symptoms.  Specifically she denies any one-sided weakness, numbness, amaurosis or speech issues. She continues to be compliant with aspirin  and Lipitor .  Past Medical History:  Diagnosis Date   Allergic rhinitis    CAD (coronary artery disease), native coronary artery    NSTEMI with 95% RCA s/p DES to RCA 06/2017   Carotid artery occlusion    Complication of anesthesia    makes her confused cursing etc..   Diverticulosis    GERD (gastroesophageal reflux disease)    Heart murmur    Systolic heart murmur with Aortic valve sclerosis by ECHO   History of kidney stones    Hyperlipidemia    Hypertension    Myocardial infarction (HCC)    Osteoarthritis    Erosive OA-MRI of R hand negative for synovitis, only OA-Dr Ishmael   Osteopenia    Pelvic prolapse    PVC's (premature ventricular contractions)    Family History  Problem Relation Age of Onset   Stroke Mother    Diabetes Mother    Heart attack Mother    Osteoporosis Father    Breast cancer Cousin    Past Surgical History:  Procedure Laterality Date   BUNIONECTOMY  08/2006   R foot and hammer roe right second toe   CARDIAC CATHETERIZATION     CARPAL TUNNEL RELEASE  2002   bil hands   CONVERSION TO TOTAL HIP Left 08/08/2022   Procedure: CONVERSION TO TOTAL HIP FROM IM NAIL;  Surgeon: Edna Toribio LABOR, MD;  Location: [facility];  Service: Orthopedics;  Laterality: Left;   CORONARY STENT INTERVENTION N/A 06/15/2017   Procedure: CORONARY STENT INTERVENTION;  Surgeon: Court Dorn PARAS, MD;  Location: [facility];  Service:  Cardiovascular;  Laterality: N/A;   CYSTOSCOPY     with laser lithotripsy Dr. Carolee 07-20-17    CYSTOSCOPY W/ URETERAL STENT PLACEMENT Left 06/09/2017   Procedure: CYSTOSCOPY WITH RETROGRADE PYELOGRAM/URETERAL STENT PLACEMENT;  Surgeon: Carolee Sherwood JONETTA DOUGLAS, MD;  Location: [facility];  Service: Urology;  Laterality: Left;   CYSTOSCOPY WITH RETROGRADE PYELOGRAM, URETEROSCOPY AND STENT PLACEMENT Left 07/20/2017   Procedure: CYSTOSCOPY WITH RETROGRADE PYELOGRAM, LEFT URETEROSCOPY HOLMIUM LASER LITHO  AND STENT EXCHANGE;  Surgeon: Carolee Sherwood JONETTA DOUGLAS, MD;  Location: [facility];  Service: Urology;  Laterality: Left;   HOLMIUM LASER APPLICATION Left 07/20/2017   Procedure: HOLMIUM LASER APPLICATION;  Surgeon: Carolee Sherwood JONETTA DOUGLAS, MD;  Location: [facility];  Service: Urology;  Laterality: Left;   INTRAMEDULLARY (IM) NAIL INTERTROCHANTERIC Left 08/04/2020   Procedure: INTRAMEDULLARY (IM) NAIL INTERTROCHANTRIC WITH CABLES;  Surgeon: Lauranne Cordella Hamilton, MD;  Location: [facility];  Service: Orthopedics;  Laterality: Left;   LEFT HEART CATH AND CORONARY ANGIOGRAPHY N/A 06/15/2017   Procedure: LEFT HEART CATH AND CORONARY ANGIOGRAPHY;  Surgeon: Court Dorn PARAS, MD;  Location: [facility];  Service: Cardiovascular;  Laterality: N/A;   OPEN REDUCTION INTERNAL FIXATION (ORIF) DISTAL RADIAL FRACTURE Left 10/23/2022   Procedure: OPEN REDUCTION INTERNAL FIXATION (ORIF) DISTAL RADIUS FRACTURE;  Surgeon: Josefina Chew, MD;  Location: [facility];  Service: Orthopedics;  Laterality: Left;   TOTAL KNEE ARTHROPLASTY Left 08/2007   TOTAL KNEE ARTHROPLASTY Right 03/2010   TRANSCAROTID ARTERY REVASCULARIZATION  Right 09/17/2023   Procedure: TRANSCAROTID ARTERY REVASCULARIZATION (TCAR);  Surgeon: Pearline Norman RAMAN, MD;  Location: [facility];  Service: Vascular;  Laterality: Right;   ULTRASOUND GUIDANCE FOR VASCULAR ACCESS Left 09/17/2023   Procedure: ULTRASOUND GUIDANCE, FOR VASCULAR ACCESS;  Surgeon: Pearline Norman RAMAN, MD;  Location: [facility];  Service:  Vascular;  Laterality: Left;    Short Social History:  Social History   Tobacco Use   Smoking status: Former    Current packs/day: 0.00    Average packs/day: 1 pack/day for 25.0 years (25.0 ttl pk-yrs)    Types: Cigarettes    Start date: 06/02/1968    Quit date: 06/02/1993    Years since quitting: 30.9   Smokeless tobacco: Never  Substance Use Topics   Alcohol  use: No    Allergies  Allergen Reactions   Ciprofloxacin Hives   Codeine Hives   Demerol  [Meperidine ] Hives   Hydrogen Peroxide Itching and Other (See Comments)    White blisters   Propoxyphene Other (See Comments)    Unknown Other reaction(s): OTHER   Tramadol Other (See Comments)    ITCHY ON THE INSIDE    Current Outpatient Medications  Medication Sig Dispense Refill   aspirin  EC 81 MG tablet Take 1 tablet (81 mg total) by mouth daily. Swallow whole. 30 tablet 12   atorvastatin  (LIPITOR ) 80 MG tablet TAKE 1 TABLET BY MOUTH ONCE DAILY AT  6  PM 90 tablet 2   cetirizine  (ZYRTEC ) 10 MG chewable tablet Chew 1 tablet (10 mg total) by mouth daily. 30 tablet 0   cholecalciferol  (VITAMIN D3) 25 MCG (1000 UNIT) tablet Take 1,000 Units by mouth daily.     clopidogrel  (PLAVIX ) 75 MG tablet Take 1 tablet by mouth once daily 90 tablet 2   Cyanocobalamin  5000 MCG TBDP Take 5,000 mcg by mouth daily.     docusate sodium  (COLACE) 100 MG capsule Take 1 capsule (100 mg total) by mouth daily. 10 capsule 0   losartan  (COZAAR ) 25 MG tablet Take 1 tablet (25 mg total) by mouth daily. 90 tablet 3   Menaquinone-7 (VITAMIN K2) 100 MCG CAPS 1 capsule.     metoprolol  tartrate (LOPRESSOR ) 25 MG tablet Take 1 tablet (25 mg total) by mouth 2 (two) times daily. 180 tablet 3   Multiple Minerals-Vitamins (CITRACAL MAXIMUM PLUS) TABS Take 2 capsules by mouth in the morning and at bedtime.     No current facility-administered medications for this visit.    REVIEW OF SYSTEMS  All other systems were reviewed and are negative     Objective:   Objective   Vitals:   04/22/24 0901 04/22/24 0903  BP: (!) 140/89 (!) 154/90  Pulse: 74   Resp: 18   Temp: 98.1 F (36.7 C)   TempSrc: Temporal   SpO2: 96%   Weight: 130 lb 14.4 oz (59.4 kg)   Height: 4' 11 (1.499 m)    Body mass index is 26.44 kg/m.  Physical Exam General: no acute distress Cardiac: hemodynamically stable Extremities: no edema, cyanosis or wounds Neuro: Alert, no focal deficits Vascular:   Right: Palpable radial  Left: Palpable radial  Data: Carotid duplex Right Carotid: The ECA appears >50% stenosed. Widely patent mid CCA to mid  ICA                stent without  evidence of stenosis.   Left Carotid: Velocities in the left ICA are consistent with a 1-39%  stenosis.               Non-hemodynamically significant plaque <50% noted in the  CCA. The                ECA appears >50% stenosed.   Vertebrals:  Left vertebral artery demonstrates antegrade flow. Atypical               antegrade flow in the right vertebral artery.  Subclavians: Right subclavian artery flow was disturbed. Normal flow               hemodynamics were seen in the left subclavian artery.      Assessment/Plan:   Jacqueline Burke is a 88 y.o. female with symptomatic carotid stenosis with a right renal artery occlusion who underwent TCAR on 10/16/2023.  She has recovered well.  The carotid duplex demonstrates a widely patent stent and without any evidence of restenosis. Regarding other vascular pathology.  She had a CT scan in 2024, no AAA noted.  Plan for follow-up in 6 months with a repeat carotid duplex.  This would be her 1 year visit and we will plan to liberalize to annual surveillance if the duplex is stable at that time.   Recommendations to optimize cardiovascular risk: Abstinence from all tobacco products. Blood glucose control with goal A1c < 7%. Blood pressure control with goal blood pressure < 140/90 mmHg. Lipid reduction therapy with goal LDL-C <55 mg/dL  Aspirin   81mg  PO QD.  Atorvastatin  40-80mg  PO QD (or other high intensity statin therapy).   Norman Burke Serve MD [facility]

## 2024-04-22 ENCOUNTER — Ambulatory Visit: Admitting: Vascular Surgery

## 2024-04-22 ENCOUNTER — Encounter: Payer: Self-pay | Admitting: Vascular Surgery

## 2024-04-22 ENCOUNTER — Ambulatory Visit (HOSPITAL_COMMUNITY)
Admission: RE | Admit: 2024-04-22 | Discharge: 2024-04-22 | Disposition: A | Discharge status: Home / Self Care | Source: Ambulatory Visit | Attending: Vascular Surgery | Admitting: Vascular Surgery

## 2024-04-22 VITALS — BP 154/90 | HR 74 | Temp 98.1°F | Resp 18 | Ht 59.0 in | Wt 130.9 lb

## 2024-04-22 DIAGNOSIS — I6523 Occlusion and stenosis of bilateral carotid arteries: Secondary | ICD-10-CM | POA: Insufficient documentation

## 2024-04-22 DIAGNOSIS — I6521 Occlusion and stenosis of right carotid artery: Secondary | ICD-10-CM | POA: Insufficient documentation

## 2024-04-25 ENCOUNTER — Other Ambulatory Visit: Payer: Self-pay | Admitting: *Deleted

## 2024-04-25 DIAGNOSIS — I6523 Occlusion and stenosis of bilateral carotid arteries: Secondary | ICD-10-CM

## 2024-04-25 DIAGNOSIS — I6521 Occlusion and stenosis of right carotid artery: Secondary | ICD-10-CM

## 2024-10-21 ENCOUNTER — Ambulatory Visit (HOSPITAL_COMMUNITY)

## 2024-10-21 ENCOUNTER — Ambulatory Visit: Admitting: Vascular Surgery
